# Patient Record
Sex: Male | Born: 1985
Health system: Southern US, Community
[De-identification: ages and names within clinical notes are randomized; demographics above are authoritative.]

## PROBLEM LIST (undated history)

## (undated) DIAGNOSIS — F329 Major depressive disorder, single episode, unspecified: Secondary | ICD-10-CM

## (undated) DIAGNOSIS — K219 Gastro-esophageal reflux disease without esophagitis: Secondary | ICD-10-CM

## (undated) DIAGNOSIS — F419 Anxiety disorder, unspecified: Secondary | ICD-10-CM

## (undated) DIAGNOSIS — F319 Bipolar disorder, unspecified: Secondary | ICD-10-CM

## (undated) DIAGNOSIS — R519 Headache, unspecified: Secondary | ICD-10-CM

## (undated) DIAGNOSIS — E079 Disorder of thyroid, unspecified: Secondary | ICD-10-CM

## (undated) DIAGNOSIS — F909 Attention-deficit hyperactivity disorder, unspecified type: Secondary | ICD-10-CM

## (undated) DIAGNOSIS — R51 Headache: Secondary | ICD-10-CM

## (undated) DIAGNOSIS — I10 Essential (primary) hypertension: Secondary | ICD-10-CM

## (undated) DIAGNOSIS — D751 Secondary polycythemia: Secondary | ICD-10-CM

## (undated) DIAGNOSIS — F32A Depression, unspecified: Secondary | ICD-10-CM

## (undated) DIAGNOSIS — G4730 Sleep apnea, unspecified: Secondary | ICD-10-CM

## (undated) HISTORY — PX: HERNIA REPAIR: SHX51

## (undated) HISTORY — PX: TOOTH EXTRACTION: SUR596

## (undated) HISTORY — DX: Secondary polycythemia: D75.1

---
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
## 2005-04-14 ENCOUNTER — Other Ambulatory Visit: Admission: RE | Admit: 2005-04-14 | Discharge: 2005-04-14 | Payer: Self-pay | Admitting: Internal Medicine

## 2006-02-12 ENCOUNTER — Ambulatory Visit: Payer: Self-pay | Admitting: Internal Medicine

## 2006-04-25 ENCOUNTER — Emergency Department (HOSPITAL_COMMUNITY): Admission: EM | Admit: 2006-04-25 | Discharge: 2006-04-26 | Payer: Self-pay | Admitting: Emergency Medicine

## 2010-08-18 ENCOUNTER — Encounter: Payer: Self-pay | Admitting: Internal Medicine

## 2011-02-17 ENCOUNTER — Encounter: Payer: Self-pay | Admitting: *Deleted

## 2011-02-17 ENCOUNTER — Other Ambulatory Visit: Payer: Self-pay

## 2011-02-17 ENCOUNTER — Emergency Department (HOSPITAL_COMMUNITY)
Admission: EM | Admit: 2011-02-17 | Discharge: 2011-02-18 | Disposition: A | Discharge status: Home / Self Care | Payer: Medicare Other | Attending: Emergency Medicine | Admitting: Emergency Medicine

## 2011-02-17 DIAGNOSIS — I10 Essential (primary) hypertension: Secondary | ICD-10-CM | POA: Insufficient documentation

## 2011-02-17 DIAGNOSIS — K8050 Calculus of bile duct without cholangitis or cholecystitis without obstruction: Secondary | ICD-10-CM

## 2011-02-17 DIAGNOSIS — E119 Type 2 diabetes mellitus without complications: Secondary | ICD-10-CM | POA: Insufficient documentation

## 2011-02-17 DIAGNOSIS — R1011 Right upper quadrant pain: Secondary | ICD-10-CM | POA: Insufficient documentation

## 2011-02-17 DIAGNOSIS — K8020 Calculus of gallbladder without cholecystitis without obstruction: Secondary | ICD-10-CM | POA: Insufficient documentation

## 2011-02-17 HISTORY — DX: Essential (primary) hypertension: I10

## 2011-02-17 LAB — BASIC METABOLIC PANEL
BUN: 23 mg/dL (ref 6–23)
CO2: 28 mEq/L (ref 19–32)
GFR calc non Af Amer: >60 mL/min (ref >60)
Glucose, Bld: 246 mg/dL — ABNORMAL HIGH (ref 70–99)
Potassium: 4.4 mEq/L (ref 3.5–5.1)
Sodium: 136 mEq/L (ref 135–145)

## 2011-02-17 LAB — HEPATIC FUNCTION PANEL
ALT: 27 U/L (ref 0–53)
AST: 17 U/L (ref 0–37)
Albumin: 3.9 g/dL (ref 3.5–5.2)
Alkaline Phosphatase: 83 U/L (ref 39–117)
Total Bilirubin: 0.9 mg/dL (ref 0.3–1.2)
Total Protein: 7.6 g/dL (ref 6.0–8.3)

## 2011-02-17 LAB — CBC
HCT: 44.1 % (ref 39.0–52.0)
Hemoglobin: 15.1 g/dL (ref 13.0–17.0)
MCH: 30.8 pg (ref 26.0–34.0)
MCHC: 34.2 g/dL (ref 30.0–36.0)
RBC: 4.9 MIL/uL (ref 4.22–5.81)

## 2011-02-17 MED ORDER — HYDROMORPHONE HCL 1 MG/ML IJ SOLN
1.0000 mg | Freq: Once | INTRAMUSCULAR | Status: AC
  Administered 2011-02-17: 1 mg via INTRAVENOUS
  Filled 2011-02-17: qty 1

## 2011-02-17 MED ORDER — ONDANSETRON HCL 4 MG/2ML IJ SOLN
4.0000 mg | Freq: Once | INTRAMUSCULAR | Status: AC
  Administered 2011-02-17: 4 mg via INTRAVENOUS
  Filled 2011-02-17: qty 2

## 2011-02-17 MED ORDER — SODIUM CHLORIDE 0.9 % IV BOLUS (SEPSIS)
1000.0000 mL | Freq: Once | INTRAVENOUS | Status: AC
  Administered 2011-02-17 – 2011-02-18 (×2): 1000 mL via INTRAVENOUS

## 2011-02-17 NOTE — ED Notes (Signed)
Pt c/o upper abdominal pain radiating around towards back. Pt also  C/o gas and stomach pressure.

## 2011-02-17 NOTE — ED Notes (Signed)
Pt c/o upper abdominal pain radiaTING AROUND TOWARDS BACK.

## 2011-02-18 ENCOUNTER — Ambulatory Visit (HOSPITAL_COMMUNITY)
Admit: 2011-02-18 | Discharge: 2011-02-18 | Disposition: A | Discharge status: Home / Self Care | Payer: Medicare Other | Source: Ambulatory Visit | Attending: Emergency Medicine | Admitting: Emergency Medicine

## 2011-02-18 DIAGNOSIS — K7689 Other specified diseases of liver: Secondary | ICD-10-CM | POA: Insufficient documentation

## 2011-02-18 DIAGNOSIS — R1011 Right upper quadrant pain: Secondary | ICD-10-CM | POA: Insufficient documentation

## 2011-02-18 DIAGNOSIS — K8020 Calculus of gallbladder without cholecystitis without obstruction: Secondary | ICD-10-CM | POA: Insufficient documentation

## 2011-02-18 LAB — URINALYSIS, ROUTINE W REFLEX MICROSCOPIC
Bilirubin Urine: NEGATIVE
Glucose, UA: NEGATIVE mg/dL
Hgb urine dipstick: NEGATIVE
Specific Gravity, Urine: 1.025 (ref 1.005–1.030)

## 2011-02-18 MED ORDER — HYDROCODONE-ACETAMINOPHEN 7.5-500 MG/15ML PO SOLN: 7.5000 mL | Freq: Four times a day (QID) | ORAL | Status: AC | PRN

## 2011-02-18 MED ORDER — HYDROCODONE-ACETAMINOPHEN 7.5-500 MG/15ML PO SOLN: 10.0000 mL | Freq: Once | ORAL | Status: DC

## 2011-02-18 NOTE — ED Provider Notes (Signed)
History     Chief Complaint  Patient presents with  . Abdominal Pain  . Back Pain   Patient is a 25 y.o. male presenting with abdominal pain and back pain. The history is provided by the patient.  Abdominal Pain The primary symptoms of the illness include abdominal pain. The primary symptoms of the illness do not include fever, shortness of breath or dysuria. The current episode started 13 to 24 hours ago. The onset of the illness was gradual. The problem has not changed since onset. The illness is associated with eating. The patient has not had a change in bowel habit. Additional symptoms associated with the illness include back pain. Symptoms associated with the illness do not include chills, anorexia, diaphoresis, heartburn, constipation, urgency or frequency. Significant associated medical issues include gallstones.  Back Pain  Associated symptoms include abdominal pain. Pertinent negatives include no chest pain, no fever, no headaches and no dysuria.  had gallstones diagnosed a month ago and has scheduled follow up with Dr Leticia Penna for surgery but has scheduled it yet. Pain worse since last night, no fevers or vomiting.  Past Medical History  Diagnosis Date  . Hypertension   . Diabetes mellitus     No past surgical history on file.  History reviewed. No pertinent family history.  History  Substance Use Topics  . Smoking status: Never Smoker   . Smokeless tobacco: Not on file  . Alcohol Use: No      Review of Systems  Constitutional: Negative for fever, chills and diaphoresis.  HENT: Negative for neck pain and neck stiffness.   Eyes: Negative for pain.  Respiratory: Negative for shortness of breath.   Cardiovascular: Negative for chest pain.  Gastrointestinal: Positive for abdominal pain. Negative for heartburn, constipation and anorexia.  Genitourinary: Negative for dysuria, urgency, frequency and discharge.  Musculoskeletal: Positive for back pain. Negative for joint  swelling.  Skin: Negative for rash.  Neurological: Negative for headaches.  All other systems reviewed and are negative.    Physical Exam  BP 149/79  Pulse 93  Temp(Src) 98.2 F (36.8 C) (Oral)  Resp 18  Ht 6\' 1"  (1.854 m)  Wt 350 lb (158.759 kg)  BMI 46.18 kg/m2  SpO2 98%  Physical Exam  Constitutional: He is oriented to person, place, and time. He appears well-developed and well-nourished.  HENT:  Head: Normocephalic and atraumatic.  Eyes: Conjunctivae and EOM are normal. Pupils are equal, round, and reactive to light.  Neck: Trachea normal. Neck supple. No thyromegaly present.  Cardiovascular: Normal rate, regular rhythm, S1 normal, S2 normal and normal pulses.     No systolic murmur is present   No diastolic murmur is present  Pulses:      Radial pulses are 2+ on the right side, and 2+ on the left side.  Pulmonary/Chest: Effort normal and breath sounds normal. He has no wheezes. He has no rhonchi. He has no rales. He exhibits no tenderness.  Abdominal: Soft. Normal appearance and bowel sounds are normal. He exhibits no distension. There is tenderness in the right upper quadrant. There is no rigidity, no rebound, no guarding, no CVA tenderness and negative Murphy's sign. No hernia.  Musculoskeletal:       BLE:s Calves nontender, no cords or erythema, negative Homans sign  Neurological: He is alert and oriented to person, place, and time. He has normal strength. No cranial nerve deficit or sensory deficit. GCS eye subscore is 4. GCS verbal subscore is 5. GCS motor subscore is  6.  Skin: Skin is warm and dry. No rash noted. He is not diaphoretic.  Psychiatric: His speech is normal.       Cooperative and appropriate    ED Course  Procedures  MDM  Pain resolved with dilaudid, PT wants to go home, agrees to follow up precautions and plan to call his surgeon today to schedule surgery.  [place] ordered for him to get this am.  Labs Reviewed  BASIC METABOLIC PANEL - Abnormal;  Notable for the following:    Glucose, Bld 246 (*)    All other components within normal limits  CBC  URINALYSIS, ROUTINE W REFLEX MICROSCOPIC  HEPATIC FUNCTION PANEL  LIPASE, BLOOD         Sunnie Nielsen, MD 02/18/11 [phone]

## 2011-02-18 NOTE — ED Notes (Signed)
Feels better, alert, no distress.

## 2011-02-18 NOTE — ED Notes (Signed)
Pt says he had u/s app 6 mos ago that showed gall stones.  U/s was done in eval of elevated liver enzymes

## 2011-02-18 NOTE — ED Notes (Signed)
Pt asking if edp was finished & if we had found him a ride. Advised pt we are waiting on edp to finish paperwork & that cabs do not start running until around 0730 in the morning. Pt states he does not know anyone & has no one to call.

## 2011-10-17 ENCOUNTER — Encounter (HOSPITAL_COMMUNITY): Payer: Self-pay | Admitting: Emergency Medicine

## 2011-10-17 ENCOUNTER — Emergency Department (HOSPITAL_COMMUNITY)
Admission: EM | Admit: 2011-10-17 | Discharge: 2011-10-17 | Disposition: A | Discharge status: Home / Self Care | Payer: Medicare Other | Attending: Emergency Medicine | Admitting: Emergency Medicine

## 2011-10-17 DIAGNOSIS — I10 Essential (primary) hypertension: Secondary | ICD-10-CM | POA: Insufficient documentation

## 2011-10-17 DIAGNOSIS — E079 Disorder of thyroid, unspecified: Secondary | ICD-10-CM | POA: Insufficient documentation

## 2011-10-17 DIAGNOSIS — E119 Type 2 diabetes mellitus without complications: Secondary | ICD-10-CM | POA: Insufficient documentation

## 2011-10-17 DIAGNOSIS — Z87891 Personal history of nicotine dependence: Secondary | ICD-10-CM | POA: Insufficient documentation

## 2011-10-17 DIAGNOSIS — J329 Chronic sinusitis, unspecified: Secondary | ICD-10-CM | POA: Insufficient documentation

## 2011-10-17 HISTORY — DX: Disorder of thyroid, unspecified: E07.9

## 2011-10-17 MED ORDER — AMOXICILLIN 500 MG PO CAPS: 500.0000 mg | ORAL_CAPSULE | Freq: Three times a day (TID) | ORAL | Status: AC

## 2011-10-17 MED ORDER — DIPHENHYDRAMINE HCL 25 MG PO CAPS: 25.0000 mg | ORAL_CAPSULE | Freq: Four times a day (QID) | ORAL | Status: AC | PRN

## 2011-10-17 NOTE — ED Provider Notes (Signed)
History     CSN: [id number]  Arrival date & time 10/17/11  0900   First MD Initiated Contact with Patient 10/17/11 0915      Chief Complaint  Patient presents with  . Nasal Congestion  . Headache  . Facial Pain    (Consider location/radiation/quality/duration/timing/severity/associated sxs/prior treatment) HPI Comments: Patient c/o nasal congestion, sinus pressure and facial pain.  Symptoms have been persistent for several days.  He denies fever, vomiting, dizziness, neck pain or neck stiffness.  Patient is a 26 y.o. male presenting with sinusitis. The history is provided by the patient. No language interpreter was used.  Sinusitis  This is a new problem. The current episode started more than 2 days ago. The problem has not changed since onset.There has been no fever. The pain is moderate. The pain has been constant since onset. Associated symptoms include congestion and sinus pressure. Pertinent negatives include no chills, no sweats, no ear pain, no sore throat, no swollen glands, no cough and no shortness of breath. He has tried nothing for the symptoms.    Past Medical History  Diagnosis Date  . Hypertension   . Diabetes mellitus   . Thyroid disease     Past Surgical History  Procedure Date  . Hernia repair     History reviewed. No pertinent family history.  History  Substance Use Topics  . Smoking status: Former Games developer  . Smokeless tobacco: Not on file  . Alcohol Use: No      Review of Systems  Constitutional: Negative for fever and chills.  HENT: Positive for congestion, rhinorrhea and sinus pressure. Negative for ear pain, sore throat, facial swelling, trouble swallowing, neck pain, neck stiffness and dental problem.   Respiratory: Negative for cough and shortness of breath.   Cardiovascular: Negative for chest pain.  Gastrointestinal: Negative for nausea and vomiting.  Skin: Negative.   Neurological: Negative for dizziness, light-headedness and headaches.   Hematological: Negative for adenopathy.  All other systems reviewed and are negative.    Allergies  Methylphenidate derivatives and Ritalin  Home Medications   Current Outpatient Rx  Name Route Sig Dispense Refill  . LISINOPRIL 40 MG PO TABS Oral Take 40 mg by mouth daily.      Marland Kitchen METFORMIN HCL 500 MG PO TABS Oral Take 500 mg by mouth daily.      Marland Kitchen PRESCRIPTION MEDICATION  Diabetic medication. Unknown name     . TRAZODONE HCL 150 MG PO TABS Oral Take 150 mg by mouth at bedtime.        BP 171/119  Pulse 84  Temp(Src) 98.4 F (36.9 C) (Oral)  Resp 18  Ht 6\' 1"  (1.854 m)  Wt 394 lb (178.717 kg)  BMI 51.98 kg/m2  SpO2 99%  Physical Exam  Nursing note and vitals reviewed. Constitutional: He is oriented to person, place, and time. He appears well-developed and well-nourished. No distress.  HENT:  Head: Normocephalic and atraumatic.  Right Ear: Tympanic membrane and ear canal normal.  Left Ear: Tympanic membrane normal. Decreased hearing is noted.  Nose: Mucosal edema present. Right sinus exhibits maxillary sinus tenderness and frontal sinus tenderness. Left sinus exhibits maxillary sinus tenderness and frontal sinus tenderness.  Mouth/Throat: Uvula is midline, oropharynx is clear and moist and mucous membranes are normal.  Neck: Normal range of motion. Neck supple.  Cardiovascular: Normal rate, regular rhythm, normal heart sounds and intact distal pulses.   Pulmonary/Chest: Effort normal and breath sounds normal. No respiratory distress.  Musculoskeletal: Normal range  of motion.  Lymphadenopathy:    He has no cervical adenopathy.  Neurological: He is alert and oriented to person, place, and time. He exhibits normal muscle tone. Coordination normal.  Skin: Skin is warm and dry.    ED Course  Procedures (including critical care time)       MDM    Patient is alert, vital signs are stable. No meningeal signs he is nontoxic appearing. He ambulates with a steady gait.  He is hypertensive at this time but states that he has not taken his antihypertensive medication this morning. He denies any chest pain dyspnea headache or focal weakness. He has tenderness to palpation over the maxillary sinuses and purulent nasal congestion. His symptoms are likely related to sinusitis. His airway is patent. I will begin treatment with antibiotics. He agrees to close followup with his primary care physician and to take his medications when he arrives home he will also return here if his symptoms worsen.   Patient / Family / Caregiver understand and agree with initial ED impression and plan with expectations set for ED visit. Pt stable in ED with no significant deterioration in condition. Pt feels improved after observation and/or treatment in ED.         Giavana Rooke L. [place], [place] 10/20/11 2149

## 2011-10-17 NOTE — ED Notes (Signed)
Sinus pressure and pain. Congestion without cough.

## 2011-10-17 NOTE — Discharge Instructions (Signed)
Sinusitis Sinusitis an infection of the air pockets (sinuses) in your face. This can cause puffiness (swelling). It can also cause drainage from your sinuses.   HOME CARE    Only take medicine as told by your doctor.   Drink enough fluids to keep your pee (urine) clear or pale yellow.   Apply moist heat or ice packs for pain relief.   Use salt (saline) nose sprays. The spray will wet the thick fluid in the nose. This can help the sinuses drain.  GET HELP RIGHT AWAY IF:    You have a fever.   Your baby is older than 3 months with a rectal temperature of 102 F (38.9 C) or higher.   Your baby is 3 months old or younger with a rectal temperature of 100.4 F (38 C) or higher.   The pain gets worse.   You get a very bad headache.   You keep throwing up (vomiting).   Your face gets puffy.  MAKE SURE YOU:    Understand these instructions.   Will watch your condition.   Will get help right away if you are not doing well or get worse.  Document Released: 12/31/2007 Document Revised: 07/03/2011 Document Reviewed: 12/31/2007 Exit[employer] Patient Information 2012 [employer]. 

## 2011-10-27 NOTE — ED Provider Notes (Signed)
Medical screening examination/treatment/procedure(s) were performed by non-physician practitioner and as supervising physician I was immediately available for consultation/collaboration.  Bohden Dung S. Cherylanne Ardelean, MD 10/27/11 1343 

## 2014-06-26 ENCOUNTER — Emergency Department (HOSPITAL_COMMUNITY)
Admission: EM | Admit: 2014-06-26 | Discharge: 2014-06-26 | Disposition: A | Discharge status: Home / Self Care | Payer: Medicare Other | Attending: Emergency Medicine | Admitting: Emergency Medicine

## 2014-06-26 ENCOUNTER — Emergency Department (HOSPITAL_COMMUNITY): Payer: Medicare Other

## 2014-06-26 ENCOUNTER — Encounter (HOSPITAL_COMMUNITY): Payer: Self-pay | Admitting: *Deleted

## 2014-06-26 DIAGNOSIS — K0889 Other specified disorders of teeth and supporting structures: Secondary | ICD-10-CM

## 2014-06-26 DIAGNOSIS — Z79899 Other long term (current) drug therapy: Secondary | ICD-10-CM | POA: Insufficient documentation

## 2014-06-26 DIAGNOSIS — K088 Other specified disorders of teeth and supporting structures: Secondary | ICD-10-CM | POA: Diagnosis not present

## 2014-06-26 DIAGNOSIS — Z87891 Personal history of nicotine dependence: Secondary | ICD-10-CM | POA: Diagnosis not present

## 2014-06-26 DIAGNOSIS — I10 Essential (primary) hypertension: Secondary | ICD-10-CM | POA: Diagnosis not present

## 2014-06-26 DIAGNOSIS — E119 Type 2 diabetes mellitus without complications: Secondary | ICD-10-CM | POA: Insufficient documentation

## 2014-06-26 DIAGNOSIS — R52 Pain, unspecified: Secondary | ICD-10-CM

## 2014-06-26 MED ORDER — LISINOPRIL 40 MG PO TABS: 40.0000 mg | ORAL_TABLET | Freq: Every day | ORAL | Status: AC

## 2014-06-26 MED ORDER — HYDROCODONE-ACETAMINOPHEN 5-325 MG PO TABS
1.0000 | ORAL_TABLET | Freq: Once | ORAL | Status: AC
  Administered 2014-06-26: 1 via ORAL
  Filled 2014-06-26: qty 1

## 2014-06-26 NOTE — ED Provider Notes (Signed)
CSN: [id number]     Arrival date & time 06/26/14  7829 History  This chart was scribed for Maudry Diego, MD by Stephania Fragmin, ED Scribe. This patient was seen in room APA06/APA06 and the patient's care was started at 12:24 PM.    Chief Complaint  Patient presents with  . Dental Pain  . Hypertension   HPI   HPI Comments: Kyle Collins is a 28 y.o. male who presents to the Emergency Department complaining of dental pain and abscess that began about a week ago. He had been to the doctor last month. Abscess was not present, but toothache was present when he went to the dentil. He has been taking Lisinopril. He is currently out of Lisinopril, with his last pill taken last week. His HTN is worsening. Abscess a week or week and half ago, he tried popping his neck which started shooting pain up eyebrow through his back. He had spasms the other night next to his lung. Dr. Scotty Court   Past Medical History  Diagnosis Date  . Hypertension   . Diabetes mellitus   . Thyroid disease    Past Surgical History  Procedure Laterality Date  . Hernia repair     No family history on file. History  Substance Use Topics  . Smoking status: Former Research scientist (life sciences)  . Smokeless tobacco: Not on file  . Alcohol Use: No    Review of Systems    Allergies  Methylphenidate derivatives and Ritalin  Home Medications   Prior to Admission medications   Medication Sig Start Date End Date Taking? Authorizing Provider  lisinopril (PRINIVIL,ZESTRIL) 40 MG tablet Take 40 mg by mouth daily.      Historical Provider, MD  metFORMIN (GLUCOPHAGE) 500 MG tablet Take 500 mg by mouth daily.      Historical Provider, MD  PRESCRIPTION MEDICATION Diabetic medication. Unknown name     Historical Provider, MD  traZODone (DESYREL) 150 MG tablet Take 150 mg by mouth at bedtime.      Historical Provider, MD   BP 187/124 mmHg  Pulse 116  Temp(Src) 98.4 F (36.9 C) (Oral)  Resp 22  Ht 6\' 1"  (1.854 m)  Wt 394 lb (178.717 kg)  BMI 51.99  kg/m2  SpO2 100% Physical Exam  Constitutional: He is oriented to person, place, and time. He appears well-developed.  HENT:  Head: Normocephalic.  Poor dentation. Upper right tooth is inflamed. Gums inflamed and tender.  Eyes: Conjunctivae are normal.  Neck: No tracheal deviation present.  Tenderness to left lateral neck.  Cardiovascular:  No murmur heard. Musculoskeletal: Normal range of motion.  Neurological: He is oriented to person, place, and time.  Skin: Skin is warm.  Psychiatric: He has a normal mood and affect.  Nursing note and vitals reviewed.   ED Course  Procedures (including critical care time)  DIAGNOSTIC STUDIES: Oxygen Saturation is 100% on room air, normal by my interpretation.    COORDINATION OF CARE: 12:26 PM - Discussed treatment plan with pt at bedside which includes ABX and pain medication and pt agreed to plan.  Labs Review Labs Reviewed - No data to display  Imaging Review No results found.   EKG Interpretation None      MDM   Final diagnoses:  None    Dental pain,   Pt to see oral surgeon today.   Pt to follow up with md for bp  The chart was scribed for me under my direct supervision.  I personally performed the  history, physical, and medical decision making and all procedures in the evaluation of this patient.Maudry Diego, MD 06/26/14 [phone]

## 2014-06-26 NOTE — ED Notes (Signed)
Pt states abscess to gum x 1 week and pt states dentist refused to treat pt "because my blood pressure was too high". Pt also states his neck was hurting one night last week and he popped his neck, now having trouble turning his head, per pt.

## 2014-06-26 NOTE — Discharge Instructions (Signed)
Follow up with dentist today and follow up with family md in 1-2 weeks for bp

## 2015-05-04 ENCOUNTER — Other Ambulatory Visit: Payer: Self-pay | Admitting: "Endocrinology

## 2015-06-01 ENCOUNTER — Other Ambulatory Visit: Payer: Self-pay | Admitting: "Endocrinology

## 2015-06-01 DIAGNOSIS — IMO0002 Reserved for concepts with insufficient information to code with codable children: Secondary | ICD-10-CM

## 2015-06-01 DIAGNOSIS — E1165 Type 2 diabetes mellitus with hyperglycemia: Secondary | ICD-10-CM

## 2015-06-01 DIAGNOSIS — E1169 Type 2 diabetes mellitus with other specified complication: Principal | ICD-10-CM

## 2015-06-05 ENCOUNTER — Other Ambulatory Visit (HOSPITAL_COMMUNITY): Payer: Self-pay | Admitting: Otolaryngology

## 2015-06-06 ENCOUNTER — Other Ambulatory Visit: Payer: Self-pay | Admitting: "Endocrinology

## 2015-06-06 NOTE — Pre-Procedure Instructions (Signed)
Kyle Collins  06/06/2015      [facility] - [place], [address] [place] [address] Phone: [phone] Fax: [phone]    Your procedure is scheduled on Thursday, June 14, 2015  Report to [facility] Admitting at 9:30 A.M.  Call this number if you have problems the morning of surgery:  [phone]   Remember:  Do not eat food or drink liquids after midnight Wednesday, June 13, 2015  Take these medicines the morning of surgery with A SIP OF WATER :escitalopram (LEXAPRO)  Do not take any oral diabetes medicines ( pills) the morning of surgery such as metFORMIN (GLUCOPHAGE-XR) Stop taking Aspirin, vitamins, fish oil, and herbal medications. Do not take any NSAIDs ie: Ibuprofen, Advil, Naproxen or any medication containing Aspirin; stop now.  How to Manage Your Diabetes Before Surgery Why is it important to control my blood sugar before and after surgery?   Improving blood sugar levels before and after surgery helps healing and can limit problems.  A way of improving blood sugar control is eating a healthy diet by:  - Eating less sugar and carbohydrates  - Increasing activity/exercise  - Talk with your doctor about reaching your blood sugar goals  High blood sugars (greater than 180 mg/dL) can raise your risk of infections and slow down your recovery so you will need to focus on controlling your diabetes during the weeks before surgery.  Make sure that the doctor who takes care of your diabetes knows about your planned surgery including the date and location.  How do I manage my blood sugars before surgery?   Check your blood sugar at least 4 times a day, 2 days before surgery to make sure that they are not too high or low.   Check your blood sugar the morning of your surgery when you wake up and every 2 hours until you get to the Short-Stay unit.  If your blood sugar is less than 70 mg/dL, you will need to treat  for low blood sugar by:  Treat a low blood sugar (less than 70 mg/dL) with 1/2 cup of clear juice (cranberry or apple), 4 glucose tablets, OR glucose gel.  Recheck blood sugar in 15 minutes after treatment (to make sure it is greater than 70 mg/dL).  If blood sugar is not greater than 70 mg/dL on re-check, call [phone] for further instructions.   Report your blood sugar to the Short-Stay nurse when you get to Short-Stay. References:  [facility], 2007 "How to Manage your Diabetes Before and After Surgery".  Do not wear jewelry, make-up or nail polish.  Do not wear lotions, powders, or perfumes.  You may not wear deodorant.  Do not shave 48 hours prior to surgery.  Men may shave face and neck.  Do not bring valuables to the hospital.  [facility] is not responsible for any belongings or valuables.  Contacts, dentures or bridgework may not be worn into surgery.  Leave your suitcase in the car.  After surgery it may be brought to your room.  For patients admitted to the hospital, discharge time will be determined by your treatment team.  Patients discharged the day of surgery will not be allowed to drive home.   Name and phone number of your driver:   Special instructions: [facility] - Preparing for Surgery  Before surgery, you can play an important role.  Because skin is not  sterile, your skin needs to be as free of germs as possible.  You can reduce the number of germs on you skin by washing with CHG (chlorahexidine gluconate) soap before surgery.  CHG is an antiseptic cleaner which kills germs and bonds with the skin to continue killing germs even after washing.  Please DO NOT use if you have an allergy to CHG or antibacterial soaps.  If your skin becomes reddened/irritated stop using the CHG and inform your nurse when you arrive at Short Stay.  Do not shave (including legs and underarms) for at least 48 hours prior to the first CHG shower.  You may shave  your face.  Please follow these instructions carefully:   1.  Shower with CHG Soap the night before surgery and the morning of Surgery.  2.  If you choose to wash your hair, wash your hair first as usual with your normal shampoo.  3.  After you shampoo, rinse your hair and body thoroughly to remove the Shampoo.  4.  Use CHG as you would any other liquid soap.  You can apply chg directly  to the skin and wash gently with scrungie or a clean washcloth.  5.  Apply the CHG Soap to your body ONLY FROM THE NECK DOWN.  Do not use on open wounds or open sores.  Avoid contact with your eyes, ears, mouth and genitals (private parts).  Wash genitals (private parts) with your normal soap.  6.  Wash thoroughly, paying special attention to the area where your surgery will be performed.  7.  Thoroughly rinse your body with warm water from the neck down.  8.  DO NOT shower/wash with your normal soap after using and rinsing off the CHG Soap.  9.  Pat yourself dry with a clean towel.            10.  Wear clean pajamas.            11.  Place clean sheets on your bed the night of your first shower and do not sleep with pets.  Day of Surgery  Do not apply any lotions/deodorants the morning of surgery.  Please wear clean clothes to the hospital/surgery center.  Please read over the following fact sheets that you were given. Pain Booklet, Coughing and Deep Breathing and Surgical Site Infection Prevention

## 2015-06-07 ENCOUNTER — Encounter (HOSPITAL_COMMUNITY)
Admission: RE | Admit: 2015-06-07 | Discharge: 2015-06-07 | Disposition: A | Discharge status: Home / Self Care | Payer: Medicare Other | Source: Ambulatory Visit | Attending: Otolaryngology | Admitting: Otolaryngology

## 2015-06-07 ENCOUNTER — Encounter (HOSPITAL_COMMUNITY): Payer: Self-pay | Admitting: Emergency Medicine

## 2015-06-07 ENCOUNTER — Encounter (HOSPITAL_COMMUNITY): Payer: Self-pay

## 2015-06-07 DIAGNOSIS — Z01818 Encounter for other preprocedural examination: Secondary | ICD-10-CM | POA: Insufficient documentation

## 2015-06-07 DIAGNOSIS — Z79899 Other long term (current) drug therapy: Secondary | ICD-10-CM | POA: Insufficient documentation

## 2015-06-07 HISTORY — DX: Gastro-esophageal reflux disease without esophagitis: K21.9

## 2015-06-07 HISTORY — DX: Bipolar disorder, unspecified: F31.9

## 2015-06-07 HISTORY — DX: Headache: R51

## 2015-06-07 HISTORY — DX: Headache, unspecified: R51.9

## 2015-06-07 HISTORY — DX: Major depressive disorder, single episode, unspecified: F32.9

## 2015-06-07 HISTORY — DX: Depression, unspecified: F32.A

## 2015-06-07 HISTORY — DX: Sleep apnea, unspecified: G47.30

## 2015-06-07 LAB — BASIC METABOLIC PANEL
ANION GAP: 12 (ref 5–15)
BUN: 15 mg/dL (ref 6–20)
CHLORIDE: 98 mmol/L — AB (ref 101–111)
CO2: 22 mmol/L (ref 22–32)
Calcium: 9.5 mg/dL (ref 8.9–10.3)
Creatinine, Ser: 0.64 mg/dL (ref 0.61–1.24)
GFR calc Af Amer: >60 mL/min (ref >60)
Glucose, Bld: 353 mg/dL — ABNORMAL HIGH (ref 65–99)
POTASSIUM: 4.3 mmol/L (ref 3.5–5.1)
SODIUM: 132 mmol/L — AB (ref 135–145)

## 2015-06-07 LAB — CBC
HCT: 46.5 % (ref 39.0–52.0)
HEMOGLOBIN: 15.9 g/dL (ref 13.0–17.0)
MCH: 30.4 pg (ref 26.0–34.0)
MCHC: 34.2 g/dL (ref 30.0–36.0)
MCV: 88.9 fL (ref 78.0–100.0)
Platelets: 222 10*3/uL (ref 150–400)
RBC: 5.23 MIL/uL (ref 4.22–5.81)
RDW: 12.9 % (ref 11.5–15.5)
WBC: 7.3 10*3/uL (ref 4.0–10.5)

## 2015-06-07 LAB — GLUCOSE, CAPILLARY: GLUCOSE-CAPILLARY: 293 mg/dL — AB (ref 65–99)

## 2015-06-07 NOTE — Progress Notes (Addendum)
PCP: Dr. Scotty Court: [facility], pt. States he has not had an ekg in his office Endocrinologist: Dr. Dorris Fetch : pt states he doesn't check blood sugar,checks randomly.   Pt. States had sleep study done 1 yr. Ago at [facility], will request.

## 2015-06-08 LAB — HEMOGLOBIN A1C
Hgb A1c MFr Bld: 11.4 % — ABNORMAL HIGH (ref 4.8–5.6)
Mean Plasma Glucose: 280 mg/dL

## 2015-06-08 NOTE — Progress Notes (Signed)
Anesthesia Chart Review:  Pt is 29 year old male scheduled for R maxillary antrostomy with tissue removal on 06/14/15 with Dr. Redmond Baseman.   PMH includes:  HTN, DM, OSA, thyroid disease, bipolar disorder, GERD. Current smoker. BMI 48.   Medications include: lisinopril, metformin  Preoperative labs reviewed.  HgbA1c 11.4, glucose 353.   Chest x-ray 06/26/14 reviewed. Negative exam.   EKG 06/07/15: NSR. Nonspecific ST abnormality  Pt will need medical evaluation for uncontrolled DM prior to surgery. Left voicemail for Ailene Ards in Dr. Redmond Baseman' office.   Willeen Cass, FNP-BC [facility] Short Stay Surgical Center/Anesthesiology Phone: [phone] 06/08/2015 4:36 PM

## 2015-06-14 ENCOUNTER — Ambulatory Visit (HOSPITAL_COMMUNITY): Admission: RE | Admit: 2015-06-14 | Payer: Medicare Other | Source: Ambulatory Visit | Admitting: Otolaryngology

## 2015-06-14 ENCOUNTER — Encounter (HOSPITAL_COMMUNITY): Admission: RE | Payer: Self-pay | Source: Ambulatory Visit

## 2015-06-14 SURGERY — MAXILLARY ANTROSTOMY
Anesthesia: General | Laterality: Right

## 2015-07-06 ENCOUNTER — Ambulatory Visit: Payer: Self-pay | Admitting: "Endocrinology

## 2015-07-16 ENCOUNTER — Ambulatory Visit (INDEPENDENT_AMBULATORY_CARE_PROVIDER_SITE_OTHER): Payer: Medicare Other | Admitting: "Endocrinology

## 2015-07-16 ENCOUNTER — Encounter: Payer: Self-pay | Admitting: "Endocrinology

## 2015-07-16 VITALS — BP 138/97 | HR 76 | Ht 73.0 in | Wt 355.0 lb

## 2015-07-16 DIAGNOSIS — Z9119 Patient's noncompliance with other medical treatment and regimen: Secondary | ICD-10-CM | POA: Insufficient documentation

## 2015-07-16 DIAGNOSIS — IMO0001 Reserved for inherently not codable concepts without codable children: Secondary | ICD-10-CM | POA: Insufficient documentation

## 2015-07-16 DIAGNOSIS — I10 Essential (primary) hypertension: Secondary | ICD-10-CM | POA: Diagnosis not present

## 2015-07-16 DIAGNOSIS — E1165 Type 2 diabetes mellitus with hyperglycemia: Secondary | ICD-10-CM | POA: Diagnosis not present

## 2015-07-16 DIAGNOSIS — Z91199 Patient's noncompliance with other medical treatment and regimen due to unspecified reason: Secondary | ICD-10-CM

## 2015-07-16 MED ORDER — CANAGLIFLOZIN 100 MG PO TABS: 100.0000 mg | ORAL_TABLET | Freq: Every day | ORAL | Status: DC

## 2015-07-16 NOTE — Patient Instructions (Signed)

## 2015-07-17 NOTE — Progress Notes (Signed)
Subjective:    Patient ID: Kyle Collins, male    DOB: 22-May-1986, PCP Deloria Lair, MD   Past Medical History  Diagnosis Date  . Hypertension   . Diabetes mellitus   . Thyroid disease   . Sleep apnea   . Depression   . Bipolar disorder ([place])   . GERD (gastroesophageal reflux disease)   . Headache    Past Surgical History  Procedure Laterality Date  . Hernia repair    . Tooth extraction  spring 2016    top left    Social History   Social History  . Marital Status: Single    Spouse Name: N/A  . Number of Children: N/A  . Years of Education: N/A   Social History Main Topics  . Smoking status: Current Every Day Smoker    Types: E-cigarettes  . Smokeless tobacco: None  . Alcohol Use: No  . Drug Use: No  . Sexual Activity: Not Asked   Other Topics Concern  . None   Social History Narrative   Outpatient Encounter Prescriptions as of 07/16/2015  Medication Sig  . escitalopram (LEXAPRO) 20 MG tablet Take 20 mg by mouth daily.  Marland Kitchen lisinopril (PRINIVIL,ZESTRIL) 40 MG tablet Take 1 tablet (40 mg total) by mouth daily.  . metFORMIN (GLUCOPHAGE-XR) 500 MG 24 hr tablet Take 1,000 mg by mouth 2 (two) times daily.  . traZODone (DESYREL) 150 MG tablet Take 75-150 mg by mouth at bedtime as needed for sleep.   . [DISCONTINUED] glipiZIDE (GLUCOTROL) 5 MG tablet Take 5 mg by mouth 2 (two) times daily before a meal.  . canagliflozin (INVOKANA) 100 MG TABS tablet Take 1 tablet (100 mg total) by mouth daily before breakfast.  . tiZANidine (ZANAFLEX) 4 MG tablet Take 4 mg by mouth 2 (two) times daily as needed for muscle spasms (headache).   No facility-administered encounter medications on file as of 07/16/2015.   ALLERGIES: Allergies  Allergen Reactions  . Methylphenidate Derivatives Other (See Comments)    Patient states it made him "act like a zombie" when given as a child  . Ritalin [Methylphenidate Hcl]    VACCINATION STATUS:  There is no immunization history on  file for this patient.  Diabetes He presents for his follow-up diabetic visit. He has type 2 diabetes mellitus. Onset time: Diagnosed at approximate age of 6 years. His disease course has been worsening. There are no hypoglycemic associated symptoms. Pertinent negatives for hypoglycemia include no confusion, headaches, pallor or seizures. Associated symptoms include polydipsia, polyuria and visual change. Pertinent negatives for diabetes include no chest pain, no fatigue, no polyphagia and no weakness. There are no hypoglycemic complications. Symptoms are worsening. There are no diabetic complications. Risk factors for coronary artery disease include diabetes mellitus, hypertension, male sex, obesity, sedentary lifestyle and tobacco exposure. Current diabetic treatment includes oral agent (monotherapy). He is compliant with treatment none of the time. He is following a generally unhealthy diet. When asked about meal planning, he reported none. He has not had a previous visit with a dietitian (He missed his appointment with the dietitian.). Home blood sugar record trend: He does not monitor blood glucose.  Hypertension This is a chronic problem. The current episode started more than 1 year ago. Pertinent negatives include no chest pain, headaches, neck pain, palpitations or shortness of breath. Past treatments include ACE inhibitors.     Review of Systems  Constitutional: Negative for fever, chills, fatigue and unexpected weight change.  HENT: Negative for  dental problem, mouth sores and trouble swallowing.   Eyes: Negative for visual disturbance.  Respiratory: Negative for cough, choking, chest tightness, shortness of breath and wheezing.   Cardiovascular: Negative for chest pain, palpitations and leg swelling.  Gastrointestinal: Negative for nausea, vomiting, abdominal pain, diarrhea, constipation and abdominal distention.  Endocrine: Positive for polydipsia and polyuria. Negative for polyphagia.   Genitourinary: Negative for dysuria, urgency, hematuria and flank pain.  Musculoskeletal: Negative for myalgias, back pain, gait problem and neck pain.  Skin: Negative for pallor, rash and wound.  Neurological: Negative for seizures, syncope, weakness, numbness and headaches.  Psychiatric/Behavioral: Negative.  Negative for confusion and dysphoric mood.    Objective:    BP 138/97 mmHg  Pulse 76  Ht 6\' 1"  (1.854 m)  Wt 355 lb (161.027 kg)  BMI 46.85 kg/m2  SpO2 98%  Wt Readings from Last 3 Encounters:  07/16/15 355 lb (161.027 kg)  06/07/15 361 lb 6.4 oz (163.93 kg)  06/26/14 394 lb (178.717 kg)    Physical Exam  Constitutional: He is oriented to person, place, and time. He appears well-developed and well-nourished. He is cooperative. No distress.  HENT:  Head: Normocephalic and atraumatic.  Eyes: EOM are normal.  Neck: Normal range of motion. Neck supple. No tracheal deviation present. No thyromegaly present.  Cardiovascular: Normal rate, S1 normal, S2 normal and normal heart sounds.  Exam reveals no gallop.   No murmur heard. Pulses:      Dorsalis pedis pulses are 1+ on the right side, and 1+ on the left side.       Posterior tibial pulses are 1+ on the right side, and 1+ on the left side.  Pulmonary/Chest: Breath sounds normal. No respiratory distress. He has no wheezes.  Abdominal: Soft. Bowel sounds are normal. He exhibits no distension. There is no tenderness. There is no guarding and no CVA tenderness.  Musculoskeletal: He exhibits no edema.       Right shoulder: He exhibits no swelling and no deformity.  Neurological: He is alert and oriented to person, place, and time. He has normal strength and normal reflexes. No cranial nerve deficit or sensory deficit. Gait normal.  Skin: Skin is warm and dry. No rash noted. No cyanosis. Nails show no clubbing.  Psychiatric: His speech is normal. Cognition and memory are normal.  Noncompliant, defensive and reluctant affect.     Results for orders placed or performed during the hospital encounter of 06/07/15  Glucose, capillary  Result Value Ref Range   Glucose-Capillary 293 (H) 65 - 99 mg/dL  Hemoglobin A1c  Result Value Ref Range   Hgb A1c MFr Bld 11.4 (H) 4.8 - 5.6 %   Mean Plasma Glucose 280 mg/dL  Basic metabolic panel  Result Value Ref Range   Sodium 132 (L) 135 - 145 mmol/L   Potassium 4.3 3.5 - 5.1 mmol/L   Chloride 98 (L) 101 - 111 mmol/L   CO2 22 22 - 32 mmol/L   Glucose, Bld 353 (H) 65 - 99 mg/dL   BUN 15 6 - 20 mg/dL   Creatinine, Ser 0.64 0.61 - 1.24 mg/dL   Calcium 9.5 8.9 - 10.3 mg/dL   GFR calc non Af Amer >60 >60 mL/min   GFR calc Af Amer >60 >60 mL/min   Anion gap 12 5 - 15  CBC  Result Value Ref Range   WBC 7.3 4.0 - 10.5 K/uL   RBC 5.23 4.22 - 5.81 MIL/uL   Hemoglobin 15.9 13.0 - 17.0 g/dL  HCT 46.5 39.0 - 52.0 %   MCV 88.9 78.0 - 100.0 fL   MCH 30.4 26.0 - 34.0 pg   MCHC 34.2 30.0 - 36.0 g/dL   RDW 12.9 11.5 - 15.5 %   Platelets 222 150 - 400 K/uL   Complete Blood Count (Most recent): Lab Results  Component Value Date   WBC 7.3 06/07/2015   HGB 15.9 06/07/2015   HCT 46.5 06/07/2015   MCV 88.9 06/07/2015   PLT 222 06/07/2015   Chemistry (most recent): Lab Results  Component Value Date   NA 132* 06/07/2015   K 4.3 06/07/2015   CL 98* 06/07/2015   CO2 22 06/07/2015   BUN 15 06/07/2015   CREATININE 0.64 06/07/2015   Diabetic Labs (most recent): Lab Results  Component Value Date   HGBA1C 11.4* 06/07/2015     Assessment & Plan:   1. Uncontrolled type 2 diabetes mellitus without complication, without [place]-term current use of insulin ([place]   - His diabetes is  complicated by noncompliance and patient remains at a high risk for more acute and chronic complications of diabetes which include CAD, CVA, CKD, retinopathy, and neuropathy. These are all discussed in detail with the patient.  Patient came with elevated A1c of 11.4 %. He did not monitor blood  glucose, did not bring any meter. Recent labs reviewed.   - I have re-counseled the patient on diet management and weight loss  by adopting a carbohydrate restricted / protein rich  Diet.  - Suggestion is made for patient to avoid simple carbohydrates   from their diet including Cakes , Desserts, Ice Cream,  Soda (  diet and regular) , Sweet Tea , Candies,  Chips, Cookies, Artificial Sweeteners,   and "Sugar-free" Products .  This will help patient to have stable blood glucose profile and potentially avoid unintended  Weight gain.  - Patient is advised to stick to a routine mealtimes to eat 3 meals  a day and avoid unnecessary snacks ( to snack only to correct hypoglycemia).  - The patient  has been  scheduled with Jearld Fenton, RDN, CDE for individualized DM education. He did not show up for appointment, will reschedule.  - I have approached patient with the following individualized plan to manage diabetes and patient agrees.  - Based on his significantly elevated A1c he would need insulin therapy however he refused. -He would not even monitor blood glucose.   -I will continue MTF 1000mg  po BID, and resume Invokana 100mg  po qday.   - Patient will be considered for incretin therapy as appropriate next visit. - Patient specific target  for A1c; LDL, HDL, Triglycerides, and  Waist Circumference were discussed in detail.  2) BP/HTN: Uncontrolled. Continue current medications including ACEI/ARB.  3)  Weight/Diet:  He is reluctantly accepts CDE consult, he is not motivated to exercise. - carbohydrates information provided. 4. Personal history of noncompliance with medical treatment, presenting hazards to health -I tried to counsel this patient however he is frankly unconcerned and does not want to take any responsibility. 5) Chronic Care/Health Maintenance:  -Patient  on ACEI/ARB  and encouraged to continue to follow up with Ophthalmology, Podiatrist at least yearly or according to  recommendations, and advised to  quit smoking. I have recommended yearly flu vaccine and pneumonia vaccination at least every 5 years; moderate intensity exercise for up to 150 minutes weekly; and  sleep for at least 7 hours a day.  - 25 minutes of time was spent on  the care of this patient , 50% of which was applied for counseling on diabetes complications and their preventions.  - I advised patient to maintain close follow up with TAPPER,DAVID B, MD for primary care needs.  Patient is asked to bring meter and  blood glucose logs during their next visit.   Follow up plan: -Return in about 3 months (around 10/14/2015) for diabetes, high blood pressure, high cholesterol.  Glade Lloyd, MD Phone: [phone]  Fax: [phone]   07/17/2015, 8:47 PM

## 2015-07-31 ENCOUNTER — Encounter (HOSPITAL_COMMUNITY): Payer: Self-pay | Admitting: Emergency Medicine

## 2015-07-31 ENCOUNTER — Emergency Department (HOSPITAL_COMMUNITY)
Admission: EM | Admit: 2015-07-31 | Discharge: 2015-07-31 | Disposition: A | Discharge status: Home / Self Care | Payer: Medicare Other | Attending: Emergency Medicine | Admitting: Emergency Medicine

## 2015-07-31 ENCOUNTER — Emergency Department (HOSPITAL_COMMUNITY): Payer: Medicare Other

## 2015-07-31 DIAGNOSIS — F17210 Nicotine dependence, cigarettes, uncomplicated: Secondary | ICD-10-CM | POA: Diagnosis not present

## 2015-07-31 DIAGNOSIS — Z8719 Personal history of other diseases of the digestive system: Secondary | ICD-10-CM | POA: Insufficient documentation

## 2015-07-31 DIAGNOSIS — F319 Bipolar disorder, unspecified: Secondary | ICD-10-CM | POA: Diagnosis not present

## 2015-07-31 DIAGNOSIS — Z7984 Long term (current) use of oral hypoglycemic drugs: Secondary | ICD-10-CM | POA: Diagnosis not present

## 2015-07-31 DIAGNOSIS — R739 Hyperglycemia, unspecified: Secondary | ICD-10-CM | POA: Diagnosis not present

## 2015-07-31 DIAGNOSIS — I10 Essential (primary) hypertension: Secondary | ICD-10-CM | POA: Insufficient documentation

## 2015-07-31 DIAGNOSIS — R0989 Other specified symptoms and signs involving the circulatory and respiratory systems: Secondary | ICD-10-CM | POA: Diagnosis not present

## 2015-07-31 DIAGNOSIS — E119 Type 2 diabetes mellitus without complications: Secondary | ICD-10-CM | POA: Insufficient documentation

## 2015-07-31 DIAGNOSIS — Z8669 Personal history of other diseases of the nervous system and sense organs: Secondary | ICD-10-CM | POA: Diagnosis not present

## 2015-07-31 DIAGNOSIS — Z79899 Other long term (current) drug therapy: Secondary | ICD-10-CM | POA: Diagnosis not present

## 2015-07-31 NOTE — Discharge Instructions (Signed)
Clear liquids only after midnight.  Call Dr. Olevia Perches office first thing in the morning at [phone].

## 2015-07-31 NOTE — ED Notes (Signed)
Pt states he had chicken for dinner and feels like something is stuck in his throat. Pt can swallow saliva without any problems.

## 2015-07-31 NOTE — ED Provider Notes (Signed)
CSN: [id number]     Arrival date & time 07/31/15  1957 History  By signing my name below, I, Hansel Feinstein, attest that this documentation has been prepared under the direction and in the presence of Pattricia Boss, MD. Electronically Signed: Hansel Feinstein, ED Scribe. 07/31/2015. 8:14 PM.    Chief Complaint  Patient presents with  . Airway Obstruction    Patient is a 30 y.o. male presenting with foreign body swallowed. The history is provided by the patient. No language interpreter was used.  Swallowed Foreign Body This is a new problem. The current episode started 1 to 2 hours ago. The problem occurs constantly. The problem has not changed since onset.Pertinent negatives include no shortness of breath. Nothing aggravates the symptoms. Nothing relieves the symptoms. He has tried nothing for the symptoms. The treatment provided no relief.    HPI Comments: MEMPHIS SHREINER is a 30 y.o. male with h/o DM, HTN, OSA, bipolar disorder, GERD who presents to the Emergency Department complaining of a constant foreign body sensation in his esophagus onset an hour ago while eating chicken. He notes that he feels as though a chicken bone in stuck in his esophagus. Pt states no relieving or worsening factors. He takes daily Metformin, Lisinopril, Trazodone, Invokana and Lexapro. NKDA. Pt smokes e-cigarettes. He denies difficulty breathing or swallowing.   Past Medical History  Diagnosis Date  . Hypertension   . Diabetes mellitus   . Thyroid disease   . Sleep apnea   . Depression   . Bipolar disorder ([place])   . GERD (gastroesophageal reflux disease)   . Headache    Past Surgical History  Procedure Laterality Date  . Hernia repair    . Tooth extraction  spring 2016    top left    Family History  Problem Relation Age of Onset  . Diabetes Mother   . Diabetes Father    Social History  Substance Use Topics  . Smoking status: Current Every Day Smoker    Types: E-cigarettes  . Smokeless tobacco: None  .  Alcohol Use: No    Review of Systems  HENT: Negative for trouble swallowing.   Respiratory: Negative for shortness of breath.   All other systems reviewed and are negative.  Allergies  Methylphenidate derivatives and Ritalin  Home Medications   Prior to Admission medications   Medication Sig Start Date End Date Taking? Authorizing Provider  canagliflozin (INVOKANA) 100 MG TABS tablet Take 1 tablet (100 mg total) by mouth daily before breakfast. 07/16/15  Yes Cassandria Anger, MD  escitalopram (LEXAPRO) 20 MG tablet Take 20 mg by mouth daily.   Yes Historical Provider, MD  lisinopril (PRINIVIL,ZESTRIL) 40 MG tablet Take 1 tablet (40 mg total) by mouth daily. 06/26/14  Yes Milton Ferguson, MD  metFORMIN (GLUCOPHAGE-XR) 500 MG 24 hr tablet Take 1,000 mg by mouth 2 (two) times daily.   Yes Historical Provider, MD  traZODone (DESYREL) 150 MG tablet Take 75-150 mg by mouth at bedtime as needed for sleep.    Yes Historical Provider, MD   BP 142/81 mmHg  Pulse 104  Temp(Src) 98.5 F (36.9 C) (Oral)  Resp 18  Ht 6\' 1"  (1.854 m)  Wt 350 lb (158.759 kg)  BMI 46.19 kg/m2  SpO2 94% Physical Exam  Constitutional: He is oriented to person, place, and time. He appears well-developed and well-nourished.  HENT:  Head: Normocephalic and atraumatic.  Right Ear: External ear normal.  Left Ear: External ear normal.  Nose: Nose  normal.  Mouth/Throat: Oropharynx is clear and moist.  Eyes: Conjunctivae and EOM are normal. Pupils are equal, round, and reactive to light.  Neck: Normal range of motion. Neck supple.  Cardiovascular: Normal rate, regular rhythm, normal heart sounds and intact distal pulses.   Pulmonary/Chest: Effort normal and breath sounds normal. No respiratory distress. He has no wheezes. He exhibits no tenderness.  Abdominal: Soft. Bowel sounds are normal. He exhibits no distension and no mass. There is no tenderness. There is no guarding.  Musculoskeletal: Normal range of motion.   Neurological: He is alert and oriented to person, place, and time. He has normal reflexes. He exhibits normal muscle tone. Coordination normal.  Skin: Skin is warm and dry.  Psychiatric: He has a normal mood and affect. His behavior is normal. Judgment and thought content normal.  Nursing note and vitals reviewed.   ED Course  Procedures (including critical care time) DIAGNOSTIC STUDIES: Oxygen Saturation is 94% on RA, adequate by my interpretation.    COORDINATION OF CARE: 8:12 PM Discussed treatment plan with pt at bedside which includes DG neck soft tissue and pt agreed to plan.  9:24 PM Informed pt of normal imaging results.    Imaging Review Dg Neck Soft Tissue  07/31/2015  CLINICAL DATA:  Foreign body sensation in throat. Sensation after eating chicken 1 hour prior. Question retained chicken bone. EXAM: NECK SOFT TISSUES - 1+ VIEW COMPARISON:  Cervical spine radiographs 06/26/2014 FINDINGS: There is no evidence of retropharyngeal soft tissue swelling or epiglottic enlargement. The cervical airway is unremarkable. No radio-opaque foreign body identified. IMPRESSION: No radiopaque foreign body. Electronically Signed   By: Jeb Levering M.D.   On: 07/31/2015 20:42   I have personally reviewed and evaluated these images as part of my medical decision-making.   MDM   Final diagnoses:  Foreign body sensation in throat    Discussed with Dr. Laural Golden.  Patient npo x clear liquids after mn and to call Dr. Laural Golden first thing in am if symptoms persist.  Advised re return precautions and voices understanding.    I personally performed the services described in this documentation, which was scribed in my presence. The recorded information has been reviewed and considered.     Pattricia Boss, MD 08/02/15 [phone]

## 2015-09-05 DIAGNOSIS — J0100 Acute maxillary sinusitis, unspecified: Secondary | ICD-10-CM | POA: Diagnosis not present

## 2015-09-05 DIAGNOSIS — N481 Balanitis: Secondary | ICD-10-CM | POA: Diagnosis not present

## 2015-09-05 DIAGNOSIS — K130 Diseases of lips: Secondary | ICD-10-CM | POA: Diagnosis not present

## 2015-10-09 ENCOUNTER — Other Ambulatory Visit: Payer: Self-pay | Admitting: "Endocrinology

## 2015-10-09 DIAGNOSIS — E1165 Type 2 diabetes mellitus with hyperglycemia: Secondary | ICD-10-CM | POA: Diagnosis not present

## 2015-10-10 LAB — HEMOGLOBIN A1C
HEMOGLOBIN A1C: 10.3 % — AB (ref <5.7)
Mean Plasma Glucose: 249 mg/dL — ABNORMAL HIGH (ref <117)

## 2015-10-10 LAB — BASIC METABOLIC PANEL
BUN: 13 mg/dL (ref 7–25)
CALCIUM: 10 mg/dL (ref 8.6–10.3)
CHLORIDE: 98 mmol/L (ref 98–110)
CO2: 26 mmol/L (ref 20–31)
CREATININE: 0.69 mg/dL (ref 0.60–1.35)
Glucose, Bld: 261 mg/dL — ABNORMAL HIGH (ref 65–99)
Potassium: 4.4 mmol/L (ref 3.5–5.3)
Sodium: 136 mmol/L (ref 135–146)

## 2015-10-15 ENCOUNTER — Ambulatory Visit: Payer: Medicare Other | Admitting: "Endocrinology

## 2015-10-26 ENCOUNTER — Encounter: Payer: Self-pay | Admitting: "Endocrinology

## 2015-10-26 ENCOUNTER — Ambulatory Visit (INDEPENDENT_AMBULATORY_CARE_PROVIDER_SITE_OTHER): Payer: Medicare Other | Admitting: "Endocrinology

## 2015-10-26 VITALS — BP 152/96 | HR 87 | Ht 73.0 in | Wt 357.0 lb

## 2015-10-26 DIAGNOSIS — Z9119 Patient's noncompliance with other medical treatment and regimen: Secondary | ICD-10-CM | POA: Diagnosis not present

## 2015-10-26 DIAGNOSIS — I10 Essential (primary) hypertension: Secondary | ICD-10-CM

## 2015-10-26 DIAGNOSIS — Z91199 Patient's noncompliance with other medical treatment and regimen due to unspecified reason: Secondary | ICD-10-CM

## 2015-10-26 DIAGNOSIS — E1165 Type 2 diabetes mellitus with hyperglycemia: Secondary | ICD-10-CM

## 2015-10-26 DIAGNOSIS — IMO0001 Reserved for inherently not codable concepts without codable children: Secondary | ICD-10-CM

## 2015-10-26 MED ORDER — HYDROCHLOROTHIAZIDE 25 MG PO TABS: 25.0000 mg | ORAL_TABLET | Freq: Every day | ORAL | Status: DC

## 2015-10-26 NOTE — Progress Notes (Signed)
Subjective:    Patient ID: Kyle Collins, male    DOB: 11-Oct-1985, PCP Deloria Lair, MD   Past Medical History  Diagnosis Date  . Hypertension   . Diabetes mellitus   . Thyroid disease   . Sleep apnea   . Depression   . Bipolar disorder ([place])   . GERD (gastroesophageal reflux disease)   . Headache    Past Surgical History  Procedure Laterality Date  . Hernia repair    . Tooth extraction  spring 2016    top left    Social History   Social History  . Marital Status: Single    Spouse Name: N/A  . Number of Children: N/A  . Years of Education: N/A   Social History Main Topics  . Smoking status: Current Every Day Smoker    Types: E-cigarettes  . Smokeless tobacco: None  . Alcohol Use: No  . Drug Use: No  . Sexual Activity: Not Asked   Other Topics Concern  . None   Social History Narrative   Outpatient Encounter Prescriptions as of 10/26/2015  Medication Sig  . canagliflozin (INVOKANA) 100 MG TABS tablet Take 1 tablet (100 mg total) by mouth daily before breakfast.  . escitalopram (LEXAPRO) 20 MG tablet Take 20 mg by mouth daily.  Marland Kitchen lisinopril (PRINIVIL,ZESTRIL) 40 MG tablet Take 1 tablet (40 mg total) by mouth daily.  . metFORMIN (GLUCOPHAGE-XR) 500 MG 24 hr tablet Take 1,000 mg by mouth daily after breakfast.  . traZODone (DESYREL) 150 MG tablet Take 75-150 mg by mouth at bedtime as needed for sleep.   . hydrochlorothiazide (HYDRODIURIL) 25 MG tablet Take 1 tablet (25 mg total) by mouth daily.   No facility-administered encounter medications on file as of 10/26/2015.   ALLERGIES: Allergies  Allergen Reactions  . Methylphenidate Derivatives Other (See Comments)    Patient states it made him "act like a zombie" when given as a child  . Ritalin [Methylphenidate Hcl]    VACCINATION STATUS:  There is no immunization history on file for this patient.  Diabetes He presents for his follow-up diabetic visit. He has type 2 diabetes mellitus. Onset time:  Diagnosed at approximate age of 65 years. His disease course has been worsening. There are no hypoglycemic associated symptoms. Pertinent negatives for hypoglycemia include no confusion, headaches, pallor or seizures. Associated symptoms include polydipsia, polyuria and visual change. Pertinent negatives for diabetes include no chest pain, no fatigue, no polyphagia and no weakness. There are no hypoglycemic complications. Symptoms are worsening. There are no diabetic complications. Risk factors for coronary artery disease include diabetes mellitus, hypertension, male sex, obesity, sedentary lifestyle and tobacco exposure. Current diabetic treatment includes oral agent (monotherapy). He is compliant with treatment none of the time. He is following a generally unhealthy diet. When asked about meal planning, he reported none. He has not had a previous visit with a dietitian (He missed his appointment with the dietitian.). Home blood sugar record trend: He does not monitor blood glucose.  Hypertension This is a chronic problem. The current episode started more than 1 year ago. Pertinent negatives include no chest pain, headaches, neck pain, palpitations or shortness of breath. Past treatments include ACE inhibitors.     Review of Systems  Constitutional: Negative for fever, chills, fatigue and unexpected weight change.  HENT: Negative for dental problem, mouth sores and trouble swallowing.   Eyes: Negative for visual disturbance.  Respiratory: Negative for cough, choking, chest tightness, shortness of breath and wheezing.  Cardiovascular: Negative for chest pain, palpitations and leg swelling.  Gastrointestinal: Negative for nausea, vomiting, abdominal pain, diarrhea, constipation and abdominal distention.  Endocrine: Positive for polydipsia and polyuria. Negative for polyphagia.  Genitourinary: Negative for dysuria, urgency, hematuria and flank pain.  Musculoskeletal: Negative for myalgias, back pain,  gait problem and neck pain.  Skin: Negative for pallor, rash and wound.  Neurological: Negative for seizures, syncope, weakness, numbness and headaches.  Psychiatric/Behavioral: Negative.  Negative for confusion and dysphoric mood.    Objective:    BP 152/96 mmHg  Pulse 87  Ht 6\' 1"  (1.854 m)  Wt 357 lb (161.934 kg)  BMI 47.11 kg/m2  SpO2 98%  Wt Readings from Last 3 Encounters:  10/26/15 357 lb (161.934 kg)  07/31/15 350 lb (158.759 kg)  07/16/15 355 lb (161.027 kg)    Physical Exam  Constitutional: He is oriented to person, place, and time. He appears well-developed and well-nourished. He is cooperative. No distress.  HENT:  Head: Normocephalic and atraumatic.  Eyes: EOM are normal.  Neck: Normal range of motion. Neck supple. No tracheal deviation present. No thyromegaly present.  Cardiovascular: Normal rate, S1 normal, S2 normal and normal heart sounds.  Exam reveals no gallop.   No murmur heard. Pulses:      Dorsalis pedis pulses are 1+ on the right side, and 1+ on the left side.       Posterior tibial pulses are 1+ on the right side, and 1+ on the left side.  Pulmonary/Chest: Breath sounds normal. No respiratory distress. He has no wheezes.  Abdominal: Soft. Bowel sounds are normal. He exhibits no distension. There is no tenderness. There is no guarding and no CVA tenderness.  Musculoskeletal: He exhibits no edema.       Right shoulder: He exhibits no swelling and no deformity.  Neurological: He is alert and oriented to person, place, and time. He has normal strength and normal reflexes. No cranial nerve deficit or sensory deficit. Gait normal.  Skin: Skin is warm and dry. No rash noted. No cyanosis. Nails show no clubbing.  Psychiatric: His speech is normal. Cognition and memory are normal.  Noncompliant, defensive and reluctant affect.    Results for orders placed or performed in visit on [id number]  Basic metabolic panel  Result Value Ref Range   Sodium 136 135 - 146  mmol/L   Potassium 4.4 3.5 - 5.3 mmol/L   Chloride 98 98 - 110 mmol/L   CO2 26 20 - 31 mmol/L   Glucose, Bld 261 (H) 65 - 99 mg/dL   BUN 13 7 - 25 mg/dL   Creat 0.69 0.60 - 1.35 mg/dL   Calcium 10.0 8.6 - 10.3 mg/dL  Hemoglobin A1c  Result Value Ref Range   Hgb A1c MFr Bld 10.3 (H) <5.7 %   Mean Plasma Glucose 249 (H) <117 mg/dL   Complete Blood Count (Most recent): Lab Results  Component Value Date   WBC 7.3 06/07/2015   HGB 15.9 06/07/2015   HCT 46.5 06/07/2015   MCV 88.9 06/07/2015   PLT 222 06/07/2015   Chemistry (most recent): Lab Results  Component Value Date   NA 136 10/09/2015   K 4.4 10/09/2015   CL 98 10/09/2015   CO2 26 10/09/2015   BUN 13 10/09/2015   CREATININE 0.69 10/09/2015    Lab Results  Component Value Date   HGBA1C 10.3* 10/09/2015   HGBA1C 11.4* 06/07/2015     Assessment & Plan:   1. Uncontrolled type 2  diabetes mellitus without complication, without Malmquist-term current use of insulin ([place]   - His diabetes is  complicated by noncompliance and patient remains at a high risk for more acute and chronic complications of diabetes which include CAD, CVA, CKD, retinopathy, and neuropathy. These are all discussed in detail with the patient.  Patient came with still high a1c of 10.3% , slightly improved from  11.4 %. He did not monitor blood glucose, did not bring any meter. Recent labs reviewed.   - I have re-counseled the patient on diet management and weight loss  by adopting a carbohydrate restricted / protein rich  Diet.  - Suggestion is made for patient to avoid simple carbohydrates   from their diet including Cakes , Desserts, Ice Cream,  Soda (  diet and regular) , Sweet Tea , Candies,  Chips, Cookies, Artificial Sweeteners,   and "Sugar-free" Products .  This will help patient to have stable blood glucose profile and potentially avoid unintended  Weight gain.  - Patient is advised to stick to a routine mealtimes to eat 3 meals  a day and avoid  unnecessary snacks ( to snack only to correct hypoglycemia).  - The patient  has been  scheduled with Jearld Fenton, RDN, CDE for individualized DM education. He did not show up for appointment, will reschedule.  - I have approached patient with the following individualized plan to manage diabetes and patient agrees.  - Based on his significantly elevated A1c he would need insulin therapy however he still  Refuses insulin therapy. -He would not even monitor blood glucose.   -I will continue MTF 500mg   ER po BID, and continue Invokana 100mg  po qday. - he is seeking clearance for elective surgery in his "sinus", but he is high risk with estimated glucose of 255 on average.  - Patient specific target  for A1c; LDL, HDL, Triglycerides, and  Waist Circumference were discussed in detail.  2) BP/HTN: Uncontrolled. Continue current medications including ACEI/ARB. I added HCTZ 25mg  po qday.  3)  Weight/Diet:  He did reluctantly accepted CDE consult, , unfortunately he did not keep appointment. he is not motivated to exercise. - carbohydrates information provided. 4. Personal history of noncompliance with medical treatment, presenting hazards to health -I tried to counsel this patient however he is frankly unconcerned and does not want to take any responsibility.  5) Chronic Care/Health Maintenance:  -Patient  on ACEI/ARB  and encouraged to continue to follow up with Ophthalmology, Podiatrist at least yearly or according to recommendations, and advised to  quit smoking. I have recommended yearly flu vaccine and pneumonia vaccination at least every 5 years; moderate intensity exercise for up to 150 minutes weekly; and  sleep for at least 7 hours a day.  - 25 minutes of time was spent on the care of this patient , 50% of which was applied for counseling on diabetes complications and their preventions.  - I advised patient to maintain close follow up with TAPPER,DAVID B, MD for primary care needs.   Patient is asked to bring meter and  blood glucose logs during their next visit.   Follow up plan: -Return in about 3 months (around 01/25/2016) for diabetes, high blood pressure.  Glade Lloyd, MD Phone: [phone]  Fax: [phone]   10/26/2015, 10:59 AM

## 2015-10-27 ENCOUNTER — Other Ambulatory Visit: Payer: Self-pay | Admitting: "Endocrinology

## 2015-10-30 ENCOUNTER — Ambulatory Visit: Payer: Medicare Other | Admitting: Urology

## 2015-12-04 ENCOUNTER — Emergency Department (HOSPITAL_COMMUNITY): Payer: Medicare Other

## 2015-12-04 ENCOUNTER — Encounter (HOSPITAL_COMMUNITY): Payer: Self-pay | Admitting: Emergency Medicine

## 2015-12-04 ENCOUNTER — Emergency Department (HOSPITAL_COMMUNITY): Payer: Medicare Other | Admitting: Anesthesiology

## 2015-12-04 ENCOUNTER — Observation Stay (HOSPITAL_COMMUNITY)
Admission: EM | Admit: 2015-12-04 | Discharge: 2015-12-05 | Disposition: A | Discharge status: Home / Self Care | Payer: Medicare Other | Attending: Orthopedic Surgery | Admitting: Orthopedic Surgery

## 2015-12-04 ENCOUNTER — Encounter (HOSPITAL_COMMUNITY)
Admission: EM | Disposition: A | Discharge status: Home / Self Care | Payer: Self-pay | Source: Home / Self Care | Attending: Emergency Medicine

## 2015-12-04 ENCOUNTER — Ambulatory Visit: Payer: Medicare Other | Admitting: Urology

## 2015-12-04 DIAGNOSIS — Y9389 Activity, other specified: Secondary | ICD-10-CM | POA: Diagnosis not present

## 2015-12-04 DIAGNOSIS — R739 Hyperglycemia, unspecified: Secondary | ICD-10-CM | POA: Diagnosis present

## 2015-12-04 DIAGNOSIS — Y92003 Bedroom of unspecified non-institutional (private) residence as the place of occurrence of the external cause: Secondary | ICD-10-CM | POA: Diagnosis not present

## 2015-12-04 DIAGNOSIS — Z6841 Body Mass Index (BMI) 40.0 and over, adult: Secondary | ICD-10-CM | POA: Diagnosis not present

## 2015-12-04 DIAGNOSIS — F319 Bipolar disorder, unspecified: Secondary | ICD-10-CM | POA: Diagnosis not present

## 2015-12-04 DIAGNOSIS — Z7984 Long term (current) use of oral hypoglycemic drugs: Secondary | ICD-10-CM | POA: Diagnosis not present

## 2015-12-04 DIAGNOSIS — R062 Wheezing: Secondary | ICD-10-CM | POA: Insufficient documentation

## 2015-12-04 DIAGNOSIS — T148 Other injury of unspecified body region: Secondary | ICD-10-CM | POA: Diagnosis not present

## 2015-12-04 DIAGNOSIS — E1165 Type 2 diabetes mellitus with hyperglycemia: Secondary | ICD-10-CM | POA: Diagnosis not present

## 2015-12-04 DIAGNOSIS — W06XXXA Fall from bed, initial encounter: Secondary | ICD-10-CM | POA: Insufficient documentation

## 2015-12-04 DIAGNOSIS — S4291XA Fracture of right shoulder girdle, part unspecified, initial encounter for closed fracture: Secondary | ICD-10-CM | POA: Diagnosis present

## 2015-12-04 DIAGNOSIS — K219 Gastro-esophageal reflux disease without esophagitis: Secondary | ICD-10-CM | POA: Insufficient documentation

## 2015-12-04 DIAGNOSIS — S43004A Unspecified dislocation of right shoulder joint, initial encounter: Secondary | ICD-10-CM | POA: Diagnosis not present

## 2015-12-04 DIAGNOSIS — S42254A Nondisplaced fracture of greater tuberosity of right humerus, initial encounter for closed fracture: Secondary | ICD-10-CM | POA: Insufficient documentation

## 2015-12-04 DIAGNOSIS — S43304A Dislocation of unspecified parts of right shoulder girdle, initial encounter: Principal | ICD-10-CM | POA: Insufficient documentation

## 2015-12-04 DIAGNOSIS — S43006A Unspecified dislocation of unspecified shoulder joint, initial encounter: Secondary | ICD-10-CM | POA: Insufficient documentation

## 2015-12-04 DIAGNOSIS — I10 Essential (primary) hypertension: Secondary | ICD-10-CM | POA: Diagnosis not present

## 2015-12-04 DIAGNOSIS — F329 Major depressive disorder, single episode, unspecified: Secondary | ICD-10-CM | POA: Diagnosis not present

## 2015-12-04 DIAGNOSIS — M25511 Pain in right shoulder: Secondary | ICD-10-CM | POA: Diagnosis present

## 2015-12-04 DIAGNOSIS — Z79899 Other long term (current) drug therapy: Secondary | ICD-10-CM | POA: Diagnosis not present

## 2015-12-04 DIAGNOSIS — S42251A Displaced fracture of greater tuberosity of right humerus, initial encounter for closed fracture: Secondary | ICD-10-CM

## 2015-12-04 DIAGNOSIS — F17290 Nicotine dependence, other tobacco product, uncomplicated: Secondary | ICD-10-CM | POA: Diagnosis not present

## 2015-12-04 DIAGNOSIS — G4730 Sleep apnea, unspecified: Secondary | ICD-10-CM | POA: Diagnosis not present

## 2015-12-04 DIAGNOSIS — Y999 Unspecified external cause status: Secondary | ICD-10-CM | POA: Diagnosis not present

## 2015-12-04 DIAGNOSIS — E119 Type 2 diabetes mellitus without complications: Secondary | ICD-10-CM | POA: Diagnosis not present

## 2015-12-04 DIAGNOSIS — M79601 Pain in right arm: Secondary | ICD-10-CM | POA: Diagnosis not present

## 2015-12-04 DIAGNOSIS — S42253A Displaced fracture of greater tuberosity of unspecified humerus, initial encounter for closed fracture: Secondary | ICD-10-CM | POA: Insufficient documentation

## 2015-12-04 HISTORY — PX: SHOULDER CLOSED REDUCTION: SHX1051

## 2015-12-04 LAB — COMPREHENSIVE METABOLIC PANEL
ALBUMIN: 4.2 g/dL (ref 3.5–5.0)
ALK PHOS: 69 U/L (ref 38–126)
ALT: 52 U/L (ref 17–63)
ANION GAP: 12 (ref 5–15)
AST: 44 U/L — ABNORMAL HIGH (ref 15–41)
BILIRUBIN TOTAL: 1 mg/dL (ref 0.3–1.2)
BUN: 15 mg/dL (ref 6–20)
CALCIUM: 9.3 mg/dL (ref 8.9–10.3)
CO2: 26 mmol/L (ref 22–32)
Chloride: 100 mmol/L — ABNORMAL LOW (ref 101–111)
Creatinine, Ser: 0.8 mg/dL (ref 0.61–1.24)
GFR calc non Af Amer: >60 mL/min (ref >60)
GLUCOSE: 429 mg/dL — AB (ref 65–99)
Potassium: 4.3 mmol/L (ref 3.5–5.1)
Sodium: 138 mmol/L (ref 135–145)
TOTAL PROTEIN: 7.7 g/dL (ref 6.5–8.1)

## 2015-12-04 LAB — GLUCOSE, CAPILLARY
GLUCOSE-CAPILLARY: 219 mg/dL — AB (ref 65–99)
GLUCOSE-CAPILLARY: 287 mg/dL — AB (ref 65–99)
GLUCOSE-CAPILLARY: 296 mg/dL — AB (ref 65–99)
GLUCOSE-CAPILLARY: 375 mg/dL — AB (ref 65–99)
Glucose-Capillary: 312 mg/dL — ABNORMAL HIGH (ref 65–99)
Glucose-Capillary: 307 mg/dL — ABNORMAL HIGH (ref 65–99)

## 2015-12-04 LAB — CBC WITH DIFFERENTIAL/PLATELET
BASOS ABS: 0 10*3/uL (ref 0.0–0.1)
BASOS PCT: 0 %
EOS ABS: 0 10*3/uL (ref 0.0–0.7)
EOS PCT: 0 %
HCT: 46.1 % (ref 39.0–52.0)
Hemoglobin: 16 g/dL (ref 13.0–17.0)
LYMPHS PCT: 16 %
Lymphs Abs: 2.1 10*3/uL (ref 0.7–4.0)
MCH: 30.9 pg (ref 26.0–34.0)
MCHC: 34.7 g/dL (ref 30.0–36.0)
MCV: 89.2 fL (ref 78.0–100.0)
MONO ABS: 1 10*3/uL (ref 0.1–1.0)
Monocytes Relative: 7 %
Neutro Abs: 10.2 10*3/uL — ABNORMAL HIGH (ref 1.7–7.7)
Neutrophils Relative %: 77 %
Platelets: 260 10*3/uL (ref 150–400)
RBC: 5.17 MIL/uL (ref 4.22–5.81)
RDW: 13 % (ref 11.5–15.5)
WBC: 13.3 10*3/uL — AB (ref 4.0–10.5)

## 2015-12-04 LAB — SURGICAL PCR SCREEN
MRSA, PCR: POSITIVE — AB
STAPHYLOCOCCUS AUREUS: POSITIVE — AB

## 2015-12-04 SURGERY — CLOSED REDUCTION, SHOULDER
Anesthesia: General | Site: Shoulder | Laterality: Right

## 2015-12-04 MED ORDER — LIDOCAINE HCL (CARDIAC) 20 MG/ML IV SOLN
INTRAVENOUS | Status: DC | PRN
  Administered 2015-12-04: 50 mg via INTRAVENOUS

## 2015-12-04 MED ORDER — ACETAMINOPHEN 325 MG PO TABS: 650.0000 mg | ORAL_TABLET | Freq: Four times a day (QID) | ORAL | Status: DC | PRN

## 2015-12-04 MED ORDER — SODIUM CHLORIDE 0.9 % IV SOLN
INTRAVENOUS | Status: AC
  Administered 2015-12-04: 16:00:00 via INTRAVENOUS

## 2015-12-04 MED ORDER — SORBITOL 70 % SOLN: 30.0000 mL | Freq: Every day | Status: DC | PRN

## 2015-12-04 MED ORDER — INSULIN ASPART 100 UNIT/ML ~~LOC~~ SOLN
12.0000 [IU] | Freq: Once | SUBCUTANEOUS | Status: AC
  Administered 2015-12-04: 12 [IU] via INTRAVENOUS

## 2015-12-04 MED ORDER — HYDROMORPHONE HCL 1 MG/ML IJ SOLN
1.0000 mg | Freq: Once | INTRAMUSCULAR | Status: AC
  Administered 2015-12-04: 1 mg via INTRAVENOUS
  Filled 2015-12-04: qty 1

## 2015-12-04 MED ORDER — PROPOFOL 10 MG/ML IV BOLUS
INTRAVENOUS | Status: DC | PRN
  Administered 2015-12-04: 200 mg via INTRAVENOUS

## 2015-12-04 MED ORDER — PROPOFOL 10 MG/ML IV BOLUS
200.0000 mg | Freq: Once | INTRAVENOUS | Status: AC
  Administered 2015-12-04: 200 mg via INTRAVENOUS
  Filled 2015-12-04: qty 20

## 2015-12-04 MED ORDER — SODIUM CHLORIDE 0.9 % IV SOLN: 250.0000 mL | INTRAVENOUS | Status: DC | PRN

## 2015-12-04 MED ORDER — PROPOFOL 10 MG/ML IV BOLUS
INTRAVENOUS | Status: AC
  Filled 2015-12-04: qty 40

## 2015-12-04 MED ORDER — HYDROMORPHONE HCL 1 MG/ML IJ SOLN
INTRAMUSCULAR | Status: DC | PRN
  Administered 2015-12-04: 1 mg via INTRAVENOUS

## 2015-12-04 MED ORDER — METOPROLOL TARTRATE 5 MG/5ML IV SOLN
5.0000 mg | Freq: Once | INTRAVENOUS | Status: AC
  Administered 2015-12-04: 5 mg via INTRAVENOUS

## 2015-12-04 MED ORDER — METOPROLOL TARTRATE 5 MG/5ML IV SOLN
INTRAVENOUS | Status: AC
  Filled 2015-12-04: qty 5

## 2015-12-04 MED ORDER — MAGNESIUM CITRATE PO SOLN: 1.0000 | Freq: Once | ORAL | Status: DC | PRN

## 2015-12-04 MED ORDER — SODIUM CHLORIDE 0.9% FLUSH
3.0000 mL | INTRAVENOUS | Status: DC | PRN
  Administered 2015-12-04: 3 mL via INTRAVENOUS
  Filled 2015-12-04: qty 3

## 2015-12-04 MED ORDER — CHLORHEXIDINE GLUCONATE CLOTH 2 % EX PADS
6.0000 | MEDICATED_PAD | Freq: Every day | CUTANEOUS | Status: DC
  Administered 2015-12-04: 6 via TOPICAL

## 2015-12-04 MED ORDER — POLYETHYLENE GLYCOL 3350 17 G PO PACK: 17.0000 g | PACK | Freq: Every day | ORAL | Status: DC | PRN

## 2015-12-04 MED ORDER — INSULIN ASPART 100 UNIT/ML ~~LOC~~ SOLN
0.0000 [IU] | SUBCUTANEOUS | Status: DC
  Administered 2015-12-04: 12 [IU] via SUBCUTANEOUS
  Administered 2015-12-04: 16 [IU] via SUBCUTANEOUS
  Administered 2015-12-04: 8 [IU] via SUBCUTANEOUS
  Administered 2015-12-04: 12 [IU] via SUBCUTANEOUS
  Administered 2015-12-05 (×2): 4 [IU] via SUBCUTANEOUS

## 2015-12-04 MED ORDER — FENTANYL CITRATE (PF) 100 MCG/2ML IJ SOLN: 25.0000 ug | INTRAMUSCULAR | Status: DC | PRN

## 2015-12-04 MED ORDER — IPRATROPIUM-ALBUTEROL 0.5-2.5 (3) MG/3ML IN SOLN
RESPIRATORY_TRACT | Status: AC
  Filled 2015-12-04: qty 3

## 2015-12-04 MED ORDER — ACETAMINOPHEN 650 MG RE SUPP: 650.0000 mg | Freq: Four times a day (QID) | RECTAL | Status: DC | PRN

## 2015-12-04 MED ORDER — HYDROCHLOROTHIAZIDE 25 MG PO TABS
25.0000 mg | ORAL_TABLET | Freq: Every day | ORAL | Status: DC
  Administered 2015-12-04 – 2015-12-05 (×2): 25 mg via ORAL
  Filled 2015-12-04 (×2): qty 1

## 2015-12-04 MED ORDER — SODIUM CHLORIDE 0.9 % IV BOLUS (SEPSIS)
500.0000 mL | Freq: Once | INTRAVENOUS | Status: AC
  Administered 2015-12-04: 500 mL via INTRAVENOUS

## 2015-12-04 MED ORDER — SUCCINYLCHOLINE CHLORIDE 20 MG/ML IJ SOLN
INTRAMUSCULAR | Status: AC
  Filled 2015-12-04: qty 2

## 2015-12-04 MED ORDER — HYDROMORPHONE HCL 1 MG/ML IJ SOLN: 0.5000 mg | INTRAMUSCULAR | Status: DC | PRN

## 2015-12-04 MED ORDER — MUPIROCIN 2 % EX OINT
1.0000 "application " | TOPICAL_OINTMENT | Freq: Two times a day (BID) | CUTANEOUS | Status: DC
  Administered 2015-12-04 – 2015-12-05 (×3): 1 via NASAL
  Filled 2015-12-04 (×3): qty 22

## 2015-12-04 MED ORDER — PROPOFOL 10 MG/ML IV BOLUS
INTRAVENOUS | Status: DC | PRN
  Administered 2015-12-04: 40 mg via INTRAVENOUS
  Administered 2015-12-04 (×2): 20 mg via INTRAVENOUS

## 2015-12-04 MED ORDER — FENTANYL CITRATE (PF) 100 MCG/2ML IJ SOLN
INTRAMUSCULAR | Status: AC
  Filled 2015-12-04: qty 2

## 2015-12-04 MED ORDER — METFORMIN HCL ER 500 MG PO TB24
1000.0000 mg | ORAL_TABLET | Freq: Every day | ORAL | Status: DC
  Administered 2015-12-04 – 2015-12-05 (×2): 1000 mg via ORAL
  Filled 2015-12-04: qty 2

## 2015-12-04 MED ORDER — ESCITALOPRAM OXALATE 10 MG PO TABS
20.0000 mg | ORAL_TABLET | Freq: Every day | ORAL | Status: DC
  Administered 2015-12-04 – 2015-12-05 (×2): 20 mg via ORAL
  Filled 2015-12-04 (×2): qty 2

## 2015-12-04 MED ORDER — SUCCINYLCHOLINE CHLORIDE 20 MG/ML IJ SOLN
INTRAMUSCULAR | Status: AC
  Filled 2015-12-04: qty 1

## 2015-12-04 MED ORDER — DOCUSATE SODIUM 100 MG PO CAPS
100.0000 mg | ORAL_CAPSULE | Freq: Two times a day (BID) | ORAL | Status: DC
  Administered 2015-12-04 – 2015-12-05 (×3): 100 mg via ORAL
  Filled 2015-12-04 (×3): qty 1

## 2015-12-04 MED ORDER — MIDAZOLAM HCL 2 MG/2ML IJ SOLN
1.0000 mg | INTRAMUSCULAR | Status: DC | PRN
Start: 2015-12-04 — End: 2015-12-04
  Administered 2015-12-04: 2 mg via INTRAVENOUS
  Filled 2015-12-04: qty 2

## 2015-12-04 MED ORDER — ONDANSETRON HCL 4 MG/2ML IJ SOLN: 4.0000 mg | Freq: Once | INTRAMUSCULAR | Status: DC | PRN

## 2015-12-04 MED ORDER — FENTANYL CITRATE (PF) 100 MCG/2ML IJ SOLN
INTRAMUSCULAR | Status: DC | PRN
  Administered 2015-12-04 (×2): 50 ug via INTRAVENOUS

## 2015-12-04 MED ORDER — HYDROMORPHONE HCL 2 MG/ML IJ SOLN
INTRAMUSCULAR | Status: AC
  Filled 2015-12-04: qty 1

## 2015-12-04 MED ORDER — METOPROLOL TARTRATE 5 MG/5ML IV SOLN
INTRAVENOUS | Status: DC | PRN
  Administered 2015-12-04: 5 mg via INTRAVENOUS

## 2015-12-04 MED ORDER — LISINOPRIL 10 MG PO TABS
40.0000 mg | ORAL_TABLET | Freq: Every day | ORAL | Status: DC
  Administered 2015-12-04 – 2015-12-05 (×2): 40 mg via ORAL
  Filled 2015-12-04 (×2): qty 4

## 2015-12-04 MED ORDER — HYDROCODONE-ACETAMINOPHEN 5-325 MG PO TABS
1.0000 | ORAL_TABLET | ORAL | Status: DC | PRN
Start: 2015-12-04 — End: 2015-12-05
  Administered 2015-12-04 – 2015-12-05 (×3): 2 via ORAL
  Filled 2015-12-04 (×3): qty 2

## 2015-12-04 MED ORDER — IPRATROPIUM-ALBUTEROL 0.5-2.5 (3) MG/3ML IN SOLN
3.0000 mL | Freq: Once | RESPIRATORY_TRACT | Status: AC
  Administered 2015-12-04: 3 mL via RESPIRATORY_TRACT

## 2015-12-04 MED ORDER — LACTATED RINGERS IV SOLN: INTRAVENOUS | Status: DC | PRN

## 2015-12-04 MED ORDER — ONDANSETRON HCL 4 MG/2ML IJ SOLN
4.0000 mg | Freq: Once | INTRAMUSCULAR | Status: AC
  Administered 2015-12-04: 4 mg via INTRAVENOUS
  Filled 2015-12-04: qty 2

## 2015-12-04 MED ORDER — CANAGLIFLOZIN 100 MG PO TABS
100.0000 mg | ORAL_TABLET | Freq: Every day | ORAL | Status: DC
  Administered 2015-12-05: 100 mg via ORAL
  Filled 2015-12-04 (×3): qty 1

## 2015-12-04 MED ORDER — SODIUM CHLORIDE 0.9 % IV SOLN
INTRAVENOUS | Status: DC
  Administered 2015-12-04: 1000 mL via INTRAVENOUS
  Administered 2015-12-04: 08:00:00 via INTRAVENOUS

## 2015-12-04 MED ORDER — SUCCINYLCHOLINE CHLORIDE 20 MG/ML IJ SOLN
INTRAMUSCULAR | Status: DC | PRN
  Administered 2015-12-04: 400 mg via INTRAVENOUS

## 2015-12-04 MED ORDER — SODIUM CHLORIDE 0.9% FLUSH
3.0000 mL | Freq: Two times a day (BID) | INTRAVENOUS | Status: DC
  Administered 2015-12-05: 3 mL via INTRAVENOUS

## 2015-12-04 MED ORDER — TRAZODONE HCL 50 MG PO TABS: 75.0000 mg | ORAL_TABLET | Freq: Every evening | ORAL | Status: DC | PRN

## 2015-12-04 SURGICAL SUPPLY — 2 items
CLOTH BEACON ORANGE TIMEOUT ST (SAFETY) ×3 IMPLANT
KIT ROOM TURNOVER APOR (KITS) ×3 IMPLANT

## 2015-12-04 NOTE — Op Note (Signed)
12/04/2015  8:16 AM  PATIENT:  Kyle Collins  30 y.o. male  PRE-OPERATIVE DIAGNOSIS:  right shoulder fracture dislocation  POST-OPERATIVE DIAGNOSIS:  right shoulder fracture dislocation  PROCEDURE:  Procedure(s): CLOSED REDUCTION SHOULDER (Right)  SURGEON:  Surgeon(s) and Role:    * Carole Civil, MD - Primary  PHYSICIAN ASSISTANT:   ASSISTANTS: none   ANESTHESIA:   general  EBL:     BLOOD ADMINISTERED:none  DRAINS: none   LOCAL MEDICATIONS USED:  NONE  SPECIMEN:  No Specimen  DISPOSITION OF SPECIMEN:  N/A  COUNTS:  No instruments were used  TOURNIQUET:  * No tourniquets in log *  DICTATION: .Dragon Dictation  PLAN OF CARE: Admit to inpatient   PATIENT DISPOSITION:  PACU - hemodynamically stable.   Delay start of Pharmacological VTE agent (>24hrs) due to surgical blood loss or risk of bleeding: not applicable  Procedure was done as follows The surgical site was marked in the preop area and the chart was reviewed surgical site was confirmed  Patient was taken to surgery  Placed on the operating table  Gen. anesthesia plus succinyl choline  After timeout shoulder was manipulated with traction countertraction and visible clunk noted.  We had the C-arm in place and he was taken through range of motion and he was found to be stable. The AP view is oblique we tried a Philip Aspen view it was better and showed reduction of the greater tuberosity fragment we took the scapular Y-view show shoulder reduction  Shoulder was stable with his arm at his side even in extremes of external rotation  Extubation taken this recovery room  The patient's glucose was 400+ so he will be admitted for hyperglycemia.

## 2015-12-04 NOTE — Addendum Note (Signed)
Addendum  created 12/04/15 [id number] by Mickel Baas, CRNA   Modules edited: Charges VN

## 2015-12-04 NOTE — ED Provider Notes (Addendum)
CSN: [id number]     Arrival date & time 12/04/15  0026 History   First MD Initiated Contact with Patient 12/04/15 0145    Chief Complaint  Patient presents with  . Arm Pain     (Consider location/radiation/quality/duration/timing/severity/associated sxs/prior Treatment) HPI  patient states he was getting out of bed in a pillow was on the floor and he slipped and fell and landed hitting his right shoulder. He states his head hit the wall and made a hole in the sheet rock. However he states he did not have loss of consciousness and he does not have a headache now. He states his main pain is in his right shoulder. He states he has right handed.    Past Medical History  Diagnosis Date  . Hypertension   . Diabetes mellitus   . Thyroid disease   . Sleep apnea   . Depression   . Bipolar disorder ([place])   . GERD (gastroesophageal reflux disease)   . Headache    Past Surgical History  Procedure Laterality Date  . Hernia repair    . Tooth extraction  spring 2016    top left    Family History  Problem Relation Age of Onset  . Diabetes Mother   . Diabetes Father    Social History  Substance Use Topics  . Smoking status: Current Every Day Smoker    Types: E-cigarettes  . Smokeless tobacco: None  . Alcohol Use: No  lives alone On disability for bipolar, diabetes  Review of Systems  All other systems reviewed and are negative.     Allergies  Methylphenidate derivatives and Ritalin  Home Medications   Prior to Admission medications   Medication Sig Start Date End Date Taking? Authorizing Provider  escitalopram (LEXAPRO) 20 MG tablet Take 20 mg by mouth daily.    Historical Provider, MD  hydrochlorothiazide (HYDRODIURIL) 25 MG tablet Take 1 tablet (25 mg total) by mouth daily. 10/26/15   Cassandria Anger, MD  INVOKANA 100 MG TABS tablet TAKE ONE TABLET BY MOUTH DAILY BEFORE BREAKFAST. 10/29/15   Cassandria Anger, MD  lisinopril (PRINIVIL,ZESTRIL) 40 MG tablet Take 1  tablet (40 mg total) by mouth daily. 06/26/14   Milton Ferguson, MD  metFORMIN (GLUCOPHAGE-XR) 500 MG 24 hr tablet Take 1,000 mg by mouth daily after breakfast.    Historical Provider, MD  traZODone (DESYREL) 150 MG tablet Take 75-150 mg by mouth at bedtime as needed for sleep.     Historical Provider, MD   BP 144/97 mmHg  Pulse 92  Temp(Src) 98.2 F (36.8 C)  Resp 18  Ht 6\' 1"  (1.854 m)  Wt 350 lb (158.759 kg)  BMI 46.19 kg/m2  SpO2 93%  Vital signs normal   Physical Exam  Constitutional: He is oriented to person, place, and time. He appears well-developed and well-nourished.  Non-toxic appearance. He does not appear ill. No distress.  HENT:  Head: Normocephalic and atraumatic.  Right Ear: External ear normal.  Left Ear: External ear normal.  Nose: Nose normal. No mucosal edema or rhinorrhea.  Mouth/Throat: Oropharynx is clear and moist and mucous membranes are normal. No dental abscesses or uvula swelling.  Eyes: Conjunctivae and EOM are normal. Pupils are equal, round, and reactive to light.  Neck: Normal range of motion and full passive range of motion without pain. Neck supple.  Cardiovascular: Normal rate, regular rhythm and normal heart sounds.  Exam reveals no gallop and no friction rub.   No murmur heard. Pulmonary/Chest:  Effort normal and breath sounds normal. No respiratory distress. He has no wheezes. He has no rhonchi. He has no rales. He exhibits no tenderness and no crepitus.  Abdominal: Soft. Normal appearance and bowel sounds are normal. He exhibits no distension. There is no tenderness. There is no rebound and no guarding.  Musculoskeletal: Normal range of motion. He exhibits no edema or tenderness.  Moves all extremities well except his right upper extremity. He appears to have a dislocation of his right shoulder with emptiness in the subdeltoid space.. However he also appears to have a deformity of the upper arm near the shoulder joint. He has intact sensation and  range of motion of his fingers. He has good distal pulses.  Neurological: He is alert and oriented to person, place, and time. He has normal strength. No cranial nerve deficit.  Skin: Skin is warm, dry and intact. No rash noted. No erythema. No pallor.  Psychiatric: He has a normal mood and affect. His speech is normal and behavior is normal. His mood appears not anxious.  Nursing note and vitals reviewed.   ED Course  Procedures (including critical care time)  Medications  propofol (DIPRIVAN) 10 mg/mL bolus/IV push (20 mg Intravenous New Bag/Given 12/04/15 0414)  HYDROmorphone (DILAUDID) 2 MG/ML injection (not administered)  HYDROmorphone (DILAUDID) injection (1 mg Intravenous Given 12/04/15 0424)  HYDROmorphone (DILAUDID) injection 1 mg (1 mg Intravenous Given 12/04/15 0203)  ondansetron (ZOFRAN) injection 4 mg (4 mg Intravenous Given 12/04/15 0157)  sodium chloride 0.9 % bolus 500 mL (0 mLs Intravenous Stopped 12/04/15 0332)  propofol (DIPRIVAN) 10 mg/mL bolus/IV push 200 mg (0 mg Intravenous Stopped 12/04/15 0424)    Patient was given IV pain medications.  Procedure sedation was done. I attempted to reduce his dislocation however there was a lot of popping and cracking felt and I was never able to fully reduce the fracture dislocation. I attempted by doing traction and modified Kocher's maneuver.   05:17 Dr Aline Brochure, orthopedics will see in the ED this morning  06:30 AM Dr Aline Brochure here in the ED, has reviewed his Xrays and is going to see patient.    Procedural sedation Performed by: Janice Norrie Consent: Verbal consent obtained. Risks and benefits: risks, benefits and alternatives were discussed Required items: required blood products, implants, devices, and special equipment available Patient identity confirmed: arm band and provided demographic data Time out: Immediately prior to procedure a "time out" was called to verify the correct patient, procedure, equipment, support staff and  site/side marked as required.  Sedation type: moderate (conscious) sedation NPO time confirmed and considedered  Sedatives: PROPOFOL  160 mg  Physician Time at Bedside: 20 min  Vitals: Vital signs were monitored during sedation. Cardiac Monitor, pulse oximeter Patient tolerance: Patient tolerated the procedure well with no immediate complications. Comments: Pt with uneventful recovered. Returned to pre-procedural sedation baseline     Imaging Review Dg Shoulder Right  12/04/2015  CLINICAL DATA:  [place] at home. EXAM: RIGHT SHOULDER - 2+ VIEW COMPARISON:  None. FINDINGS: There is an anterior subcoracoid glenohumeral dislocation. There is a comminuted fracture of the greater tuberosity. Glenoid appears grossly intact. IMPRESSION: Fracture dislocation of the glenohumeral joint. Electronically Signed   By: Andreas Newport M.D.   On: 12/04/2015 02:37   Dg Shoulder Right Port  12/04/2015  CLINICAL DATA:  Right shoulder dislocation EXAM: PORTABLE RIGHT SHOULDER - 2+ VIEW COMPARISON:  12/04/2015 at 02:19 FINDINGS: Two views of the right shoulder obtained portably demonstrate persistent anterior  subcoracoid dislocation. Greater tuberosity fracture fragments are again evident. IMPRESSION: Persistent right shoulder dislocation. Electronically Signed   By: Andreas Newport M.D.   On: 12/04/2015 04:41   Dg Humerus Right  12/04/2015  CLINICAL DATA:  Ground level fall tonight. EXAM: RIGHT HUMERUS - 2+ VIEW COMPARISON:  None. FINDINGS: There is a fracture dislocation of the right shoulder. The dislocation is anterior subcoracoid. The fracture fragment appears to the include most of the greater tuberosity. Glenoid is grossly intact. AC joint is grossly intact. Remainder of the humerus is grossly intact. Study limited due to positioning challenges and immobility of the shoulder. IMPRESSION: Fracture dislocation at the glenohumeral joint. Electronically Signed   By: Andreas Newport M.D.   On: 12/04/2015 01:36    I have personally reviewed and evaluated these images and lab results as part of my medical decision-making.   EKG Interpretation None      MDM   Final diagnoses:  Dislocation, shoulder closed, right, initial encounter  Fracture, humerus, greater tuberosity, right, closed, initial encounter    Disposition pending Dr Otho Bellows, MD, Barbette Or, MD 12/04/15 Delrae Rend  Rolland Porter, MD 12/04/15 0630

## 2015-12-04 NOTE — Interval H&P Note (Signed)
History and Physical Interval Note:  12/04/2015 7:25 AM  Kyle Collins  has presented today for surgery, with the diagnosis of right shoulder fracture dislocation  The various methods of treatment have been discussed with the patient and family. After consideration of risks, benefits and other options for treatment, the patient has consented to  Procedure(s): CLOSED REDUCTION SHOULDER (Right) as a surgical intervention .  The patient's history has been reviewed, patient examined, no change in status, stable for surgery.  I have reviewed the patient's chart and labs.  Questions were answered to the patient's satisfaction.     Arther Abbott

## 2015-12-04 NOTE — Anesthesia Procedure Notes (Signed)
Procedure Name: Intubation Date/Time: 12/04/2015 8:04 AM Performed by: Andree Elk, AMY A Pre-anesthesia Checklist: Patient identified, Patient being monitored, Timeout performed, Emergency Drugs available and Suction available Patient Re-evaluated:Patient Re-evaluated prior to inductionOxygen Delivery Method: Circle System Utilized and Circle system utilized Preoxygenation: Pre-oxygenation with 100% oxygen Intubation Type: IV induction, Rapid sequence and Cricoid Pressure applied Ventilation: Mask ventilation without difficulty Laryngoscope Size: 3 and Miller Grade View: Grade I Tube type: Oral Tube size: 7.0 mm Number of attempts: 1 Airway Equipment and Method: Stylet Placement Confirmation: ETT inserted through vocal cords under direct vision,  positive ETCO2 and breath sounds checked- equal and bilateral Secured at: 21 cm Tube secured with: Tape Dental Injury: Teeth and Oropharynx as per pre-operative assessment

## 2015-12-04 NOTE — Transfer of Care (Signed)
Immediate Anesthesia Transfer of Care Note  Patient: Kyle Collins  Procedure(s) Performed: Procedure(s): CLOSED REDUCTION SHOULDER (Right)  Patient Location: PACU  Anesthesia Type:General  Level of Consciousness: awake, alert , oriented and patient cooperative  Airway & Oxygen Therapy: Patient Spontanous Breathing and Patient connected to nasal cannula oxygen  Post-op Assessment: Report given to RN and Post -op Vital signs reviewed and stable  Post vital signs: Reviewed and stable  Last Vitals:  Filed Vitals:   12/04/15 0750 12/04/15 0755  BP: 143/87   Pulse:    Temp:    Resp: 11 16    Last Pain:  Filed Vitals:   12/04/15 0757  PainSc: 7          Complications: No apparent anesthesia complications

## 2015-12-04 NOTE — ED Notes (Signed)
During sedation procedure pt began to kick and flail on bed, had to call for additional help.

## 2015-12-04 NOTE — Consult Note (Signed)
ER CONSULT [id number]    Patient ID: Kyle Collins, male   DOB: 03-28-1986, 30 y.o.   MRN: [id number]  New patient  Requested by:Dr Tomi Bamberger  Reason for: irreducible fracture dislocation of the right shoulder   Chief Complaint  Patient presents with  . Arm Pain     Kyle Collins is a 30 y.o. male.   HPI 30 yo male with sllep apnea?CPAP, bipolar disorder, DM, HTN, fell getting out of bed and sustained a fracture dilsocation of the shoulder. The ER tried closed reduction with 160 mg of propofol and it was not successful He co numbness in the C5 deltoid area and AIN.  Location-right shoulder  Duration-today Severity-high Quality-numb, heavy, dull Modified by-movement   Review of Systems (all) Review of Systems  Constitutional: Negative for fever and chills.  HENT: Negative for congestion.   Eyes: Negative for photophobia.  Respiratory: Positive for shortness of breath and wheezing.   Cardiovascular: Negative for chest pain.  Gastrointestinal: Negative for abdominal pain.  Genitourinary: Negative for frequency.  Neurological: Positive for tingling and sensory change. Negative for headaches.  Endo/Heme/Allergies: Positive for polydipsia. Does not bruise/bleed easily.  Psychiatric/Behavioral: Positive for depression.    Past Medical History  Diagnosis Date  . Hypertension   . Diabetes mellitus   . Thyroid disease   . Sleep apnea   . Depression   . Bipolar disorder ([place])   . GERD (gastroesophageal reflux disease)   . Headache     Past Surgical History  Procedure Laterality Date  . Hernia repair    . Tooth extraction  spring 2016    top left     Family History  Problem Relation Age of Onset  . Diabetes Mother   . Diabetes Father   the family history was reviewed and there was no history of osteoporotic fracture or family history of anesthesia risk factors or bleeding    Social History Social History  Substance Use Topics  . Smoking status: Current Every Day  Smoker    Types: E-cigarettes  . Smokeless tobacco: None  . Alcohol Use: No    Allergies  Allergen Reactions  . Methylphenidate Derivatives Other (See Comments)    Patient states it made him "act like a zombie" when given as a child  . Ritalin [Methylphenidate Hcl]     Current Facility-Administered Medications  Medication Dose Route Frequency Provider Last Rate Last Dose  . HYDROmorphone (DILAUDID) 2 MG/ML injection           . HYDROmorphone (DILAUDID) injection   Intravenous PRN Rolland Porter, MD   1 mg at 12/04/15 0424  . propofol (DIPRIVAN) 10 mg/mL bolus/IV push   Intravenous Continuous PRN Rolland Porter, MD   20 mg at 12/04/15 0414   Current Outpatient Prescriptions  Medication Sig Dispense Refill  . escitalopram (LEXAPRO) 20 MG tablet Take 20 mg by mouth daily.    . hydrochlorothiazide (HYDRODIURIL) 25 MG tablet Take 1 tablet (25 mg total) by mouth daily. 90 tablet 3  . INVOKANA 100 MG TABS tablet TAKE ONE TABLET BY MOUTH DAILY BEFORE BREAKFAST. 30 tablet 2  . lisinopril (PRINIVIL,ZESTRIL) 40 MG tablet Take 1 tablet (40 mg total) by mouth daily. 30 tablet 1  . metFORMIN (GLUCOPHAGE-XR) 500 MG 24 hr tablet Take 1,000 mg by mouth daily after breakfast.    . traZODone (DESYREL) 150 MG tablet Take 75-150 mg by mouth at bedtime as needed for sleep.        Physical Exam(=30) Blood  pressure 179/117, pulse 116, temperature 98.2 F (36.8 C), resp. rate 18, height 6\' 1"  (1.854 m), weight 350 lb (158.759 kg), SpO2 90 %. Gen. Appearance normal with short stature and barrel chest  Peripheral vascular system radial artery and pulse normal in right and left wrist Lymph nodes normal cervical spine  Gait not able to assess due to fracture and sedation   Left Upper extremity  Inspection revealed no malalignment or asymmetry  Assessment of range of motion: Full range of motion was recorded  Assessment of stability: Elbow wrist and hand and shoulder were stable  Assessment of muscle strength and  tone revealed grade 5 muscle strength and normal muscle tone  Skin was normal without rash lesion or ulceration  Right upper extremity  Inspection revealed deformity of deltoid   Assessment of range of motion: no active ange of motion was recorded  Assessment of stability: Elbow wrist and hand stable, shoulder dislocated  Assessment of muscle strength and tone revealed  normal muscle tone  Skin was normal without rash lesion or ulceration  Right Lower extremity  Inspection revealed no malalignment or asymmetry  Assessment of range of motion: Full range of motion was recorded  Assessment of stability: Elbow wrist and hand and shoulder were stable  Assessment of muscle strength and tone revealed grade 5 muscle strength and normal muscle tone  Skin was normal without rash lesion or ulceration  Left lower extremity  Inspection revealed no malalignment or asymmetry  Assessment of range of motion: Full range of motion was recorded  Assessment of stability: Elbow wrist and hand and shoulder were stable  Assessment of muscle strength and tone revealed grade 5 muscle strength and normal muscle tone  Skin was normal without rash lesion or ulceration   Coordination was not  tested by finger-to-nose nose because of dislocation  Deep tendon reflexes were 2+ in the lower extremities Examination of sensation by touch was ab- normal right deltoid and right hand AIN   Mental status  Oriented to time person and place normal  Mood and affect normal without anxiety or agitation  Dx:   Data Reviewed  I reviewed the images and the reports and my independent interpretation is  i see a greater tuberosity fracture and glenohumeral disloaction of the right shoudler   Assessment  Irreducible right shoulder fracture and dislocation in setting of difficult airway; I informed him that this should be done in controlled setting in the OR and elective cuff repair and fracture fixation can be done in 2-3  weeks   Plan   Closed reduction in OR    Carole Civil MD

## 2015-12-04 NOTE — Progress Notes (Signed)
Inpatient Diabetes Program Recommendations  AACE/ADA: New Consensus Statement on Inpatient Glycemic Control (2015)  Target Ranges:  Prepandial:   less than 140 mg/dL      Peak postprandial:   less than 180 mg/dL (1-2 hours)      Critically ill patients:  140 - 180 mg/dL   Results for ALROY, PORTELA (MRN [id number]) as of 12/04/2015 11:58  Ref. Range 12/04/2015 08:26 12/04/2015 11:23  Glucose-Capillary Latest Ref Range: 65-99 mg/dL 312 (H) 287 (H)  5/9 Discussed the possibility of using insulin to manage his diabetes.  Pt seemed to be willing to consider it if necessary.  Recommend starting pt at 16 units of Lantus (0.1 Units/kg) with sensitive correction tid with meals.  If pt is going home on insulin, recommend an insulin starter kit. [place], CDE. M.Ed. Pager [phone] Inpatient Diabetes Coordinator

## 2015-12-04 NOTE — ED Notes (Signed)
Pt fell and now c/o rt arm pain.

## 2015-12-04 NOTE — H&P (View-Only) (Signed)
ER CONSULT [id number]    Patient ID: Kyle Collins, male   DOB: 03-28-1986, 30 y.o.   MRN: [id number]  New patient  Requested by:Dr Tomi Bamberger  Reason for: irreducible fracture dislocation of the right shoulder   Chief Complaint  Patient presents with  . Arm Pain     Kyle Collins is a 30 y.o. male.   HPI 30 yo male with sllep apnea?CPAP, bipolar disorder, DM, HTN, fell getting out of bed and sustained a fracture dilsocation of the shoulder. The ER tried closed reduction with 160 mg of propofol and it was not successful He co numbness in the C5 deltoid area and AIN.  Location-right shoulder  Duration-today Severity-high Quality-numb, heavy, dull Modified by-movement   Review of Systems (all) Review of Systems  Constitutional: Negative for fever and chills.  HENT: Negative for congestion.   Eyes: Negative for photophobia.  Respiratory: Positive for shortness of breath and wheezing.   Cardiovascular: Negative for chest pain.  Gastrointestinal: Negative for abdominal pain.  Genitourinary: Negative for frequency.  Neurological: Positive for tingling and sensory change. Negative for headaches.  Endo/Heme/Allergies: Positive for polydipsia. Does not bruise/bleed easily.  Psychiatric/Behavioral: Positive for depression.    Past Medical History  Diagnosis Date  . Hypertension   . Diabetes mellitus   . Thyroid disease   . Sleep apnea   . Depression   . Bipolar disorder ([place])   . GERD (gastroesophageal reflux disease)   . Headache     Past Surgical History  Procedure Laterality Date  . Hernia repair    . Tooth extraction  spring 2016    top left     Family History  Problem Relation Age of Onset  . Diabetes Mother   . Diabetes Father   the family history was reviewed and there was no history of osteoporotic fracture or family history of anesthesia risk factors or bleeding    Social History Social History  Substance Use Topics  . Smoking status: Current Every Day  Smoker    Types: E-cigarettes  . Smokeless tobacco: None  . Alcohol Use: No    Allergies  Allergen Reactions  . Methylphenidate Derivatives Other (See Comments)    Patient states it made him "act like a zombie" when given as a child  . Ritalin [Methylphenidate Hcl]     Current Facility-Administered Medications  Medication Dose Route Frequency Provider Last Rate Last Dose  . HYDROmorphone (DILAUDID) 2 MG/ML injection           . HYDROmorphone (DILAUDID) injection   Intravenous PRN Rolland Porter, MD   1 mg at 12/04/15 0424  . propofol (DIPRIVAN) 10 mg/mL bolus/IV push   Intravenous Continuous PRN Rolland Porter, MD   20 mg at 12/04/15 0414   Current Outpatient Prescriptions  Medication Sig Dispense Refill  . escitalopram (LEXAPRO) 20 MG tablet Take 20 mg by mouth daily.    . hydrochlorothiazide (HYDRODIURIL) 25 MG tablet Take 1 tablet (25 mg total) by mouth daily. 90 tablet 3  . INVOKANA 100 MG TABS tablet TAKE ONE TABLET BY MOUTH DAILY BEFORE BREAKFAST. 30 tablet 2  . lisinopril (PRINIVIL,ZESTRIL) 40 MG tablet Take 1 tablet (40 mg total) by mouth daily. 30 tablet 1  . metFORMIN (GLUCOPHAGE-XR) 500 MG 24 hr tablet Take 1,000 mg by mouth daily after breakfast.    . traZODone (DESYREL) 150 MG tablet Take 75-150 mg by mouth at bedtime as needed for sleep.        Physical Exam(=30) Blood  pressure 179/117, pulse 116, temperature 98.2 F (36.8 C), resp. rate 18, height 6\' 1"  (1.854 m), weight 350 lb (158.759 kg), SpO2 90 %. Gen. Appearance normal with short stature and barrel chest  Peripheral vascular system radial artery and pulse normal in right and left wrist Lymph nodes normal cervical spine  Gait not able to assess due to fracture and sedation   Left Upper extremity  Inspection revealed no malalignment or asymmetry  Assessment of range of motion: Full range of motion was recorded  Assessment of stability: Elbow wrist and hand and shoulder were stable  Assessment of muscle strength and  tone revealed grade 5 muscle strength and normal muscle tone  Skin was normal without rash lesion or ulceration  Right upper extremity  Inspection revealed deformity of deltoid   Assessment of range of motion: no active ange of motion was recorded  Assessment of stability: Elbow wrist and hand stable, shoulder dislocated  Assessment of muscle strength and tone revealed  normal muscle tone  Skin was normal without rash lesion or ulceration  Right Lower extremity  Inspection revealed no malalignment or asymmetry  Assessment of range of motion: Full range of motion was recorded  Assessment of stability: Elbow wrist and hand and shoulder were stable  Assessment of muscle strength and tone revealed grade 5 muscle strength and normal muscle tone  Skin was normal without rash lesion or ulceration  Left lower extremity  Inspection revealed no malalignment or asymmetry  Assessment of range of motion: Full range of motion was recorded  Assessment of stability: Elbow wrist and hand and shoulder were stable  Assessment of muscle strength and tone revealed grade 5 muscle strength and normal muscle tone  Skin was normal without rash lesion or ulceration   Coordination was not  tested by finger-to-nose nose because of dislocation  Deep tendon reflexes were 2+ in the lower extremities Examination of sensation by touch was ab- normal right deltoid and right hand AIN   Mental status  Oriented to time person and place normal  Mood and affect normal without anxiety or agitation  Dx:   Data Reviewed  I reviewed the images and the reports and my independent interpretation is  i see a greater tuberosity fracture and glenohumeral disloaction of the right shoudler   Assessment  Irreducible right shoulder fracture and dislocation in setting of difficult airway; I informed him that this should be done in controlled setting in the OR and elective cuff repair and fracture fixation can be done in 2-3  weeks   Plan   Closed reduction in OR    Carole Civil MD

## 2015-12-04 NOTE — Anesthesia Postprocedure Evaluation (Signed)
Anesthesia Post Note  Patient: Kyle Collins  Procedure(s) Performed: Procedure(s) (LRB): CLOSED REDUCTION SHOULDER (Right)  Patient location during evaluation: PACU Anesthesia Type: General Level of consciousness: awake and alert and oriented Pain management: pain level controlled Vital Signs Assessment: post-procedure vital signs reviewed and stable Respiratory status: spontaneous breathing and patient connected to nasal cannula oxygen Cardiovascular status: stable Postop Assessment: no signs of nausea or vomiting Anesthetic complications: no    Last Vitals:  Filed Vitals:   12/04/15 0750 12/04/15 0755  BP: 143/87   Pulse:    Temp:    Resp: 11 16    Last Pain:  Filed Vitals:   12/04/15 0757  PainSc: 7                  ADAMS, AMY A

## 2015-12-04 NOTE — ED Notes (Signed)
Pt says he tripped getting out of bed landing on his right arm.

## 2015-12-04 NOTE — Brief Op Note (Signed)
12/04/2015  8:16 AM  PATIENT:  Kyle Collins  30 y.o. male  PRE-OPERATIVE DIAGNOSIS:  right shoulder fracture dislocation  POST-OPERATIVE DIAGNOSIS:  right shoulder fracture dislocation  PROCEDURE:  Procedure(s): CLOSED REDUCTION SHOULDER (Right)  SURGEON:  Surgeon(s) and Role:    * Carole Civil, MD - Primary  PHYSICIAN ASSISTANT:   ASSISTANTS: none   ANESTHESIA:   general  EBL:     BLOOD ADMINISTERED:none  DRAINS: none   LOCAL MEDICATIONS USED:  NONE  SPECIMEN:  No Specimen  DISPOSITION OF SPECIMEN:  N/A  COUNTS:  No instruments were used  TOURNIQUET:  * No tourniquets in log *  DICTATION: .Dragon Dictation  PLAN OF CARE: Admit to inpatient   PATIENT DISPOSITION:  PACU - hemodynamically stable.   Delay start of Pharmacological VTE agent (>24hrs) due to surgical blood loss or risk of bleeding: not applicable  Procedure was done as follows The surgical site was marked in the preop area and the chart was reviewed surgical site was confirmed  Patient was taken to surgery  Placed on the operating table  Gen. anesthesia plus succinyl choline  After timeout shoulder was manipulated with traction countertraction and visible clunk noted.  We had the C-arm in place and he was taken through range of motion and he was found to be stable. The AP view is oblique we tried a Philip Aspen view it was better and showed reduction of the greater tuberosity fragment we took the scapular Y-view show shoulder reduction  Shoulder was stable with his arm at his side even in extremes of external rotation  Extubation taken this recovery room  The patient's glucose was 400+ so he will be admitted for hyperglycemia.

## 2015-12-04 NOTE — Anesthesia Preprocedure Evaluation (Addendum)
Anesthesia Evaluation  Patient identified by MRN, date of birth, ID band Patient awake    Reviewed: Allergy & Precautions, Patient's Chart, lab work & pertinent test results  Airway Mallampati: II  TM Distance: >3 FB     Dental  (+) Poor Dentition, Missing, Chipped,    Pulmonary sleep apnea and Continuous Positive Airway Pressure Ventilation , Current Smoker,     + decreased breath sounds      Cardiovascular hypertension, Pt. on medications  Rate:Tachycardia     Neuro/Psych  Headaches, Depression Bipolar Disorder    GI/Hepatic GERD  Medicated and Poorly Controlled,  Endo/Other  diabetes, Poorly Controlled, Type 2Morbid obesity  Renal/GU      Musculoskeletal   Abdominal (+) + obese,   Peds  Hematology   Anesthesia Other Findings   Reproductive/Obstetrics                           Anesthesia Physical Anesthesia Plan  ASA: III and emergent  Anesthesia Plan: General   Post-op Pain Management:    Induction: Intravenous, Rapid sequence and Cricoid pressure planned  Airway Management Planned: Oral ETT  Additional Equipment:   Intra-op Plan:   Post-operative Plan: Extubation in OR  Informed Consent: I have reviewed the patients History and Physical, chart, labs and discussed the procedure including the risks, benefits and alternatives for the proposed anesthesia with the patient or authorized representative who has indicated his/her understanding and acceptance.   Dental advisory given  Plan Discussed with: CRNA  Anesthesia Plan Comments:         Anesthesia Quick Evaluation

## 2015-12-05 ENCOUNTER — Encounter (HOSPITAL_COMMUNITY): Payer: Self-pay | Admitting: Orthopedic Surgery

## 2015-12-05 DIAGNOSIS — R739 Hyperglycemia, unspecified: Secondary | ICD-10-CM

## 2015-12-05 DIAGNOSIS — E119 Type 2 diabetes mellitus without complications: Secondary | ICD-10-CM | POA: Diagnosis not present

## 2015-12-05 DIAGNOSIS — S43304A Dislocation of unspecified parts of right shoulder girdle, initial encounter: Secondary | ICD-10-CM | POA: Diagnosis not present

## 2015-12-05 LAB — GLUCOSE, CAPILLARY
GLUCOSE-CAPILLARY: 176 mg/dL — AB (ref 65–99)
Glucose-Capillary: 187 mg/dL — ABNORMAL HIGH (ref 65–99)

## 2015-12-05 MED ORDER — HYDROCODONE-ACETAMINOPHEN 5-325 MG PO TABS: 1.0000 | ORAL_TABLET | ORAL | Status: DC | PRN

## 2015-12-05 NOTE — Care Management Obs Status (Signed)
[place] NOTIFICATION   Patient Details  Name: Kyle Collins MRN: [id number] Date of Birth: 1985-08-25   Medicare Observation Status Notification Given:  Yes    Alvie Heidelberg, RN 12/05/2015, 11:34 AM

## 2015-12-05 NOTE — Discharge Planning (Addendum)
Dr. Sarajane Jews rounded and informed pt is already pt of Dr. Dorris Fetch.  Feels is able to be Discharged and FU w/ Dr. Dorris Fetch as needed.  Dr. Aline Brochure paged [phone]  to inform and request DC orders.

## 2015-12-05 NOTE — Discharge Planning (Signed)
Contacted Ortho and found that Dr. In surgery till approx. 1500 today.  Will inform pt of new found. Probably will not be able to get DC orders till Dr. Is out of surgery.  Pt extremely frustrated at situation.

## 2015-12-05 NOTE — Discharge Summary (Signed)
Physician Discharge Summary  Patient ID: Kyle Collins MRN: [id number] DOB/AGE: 30/25/1987 30 y.o.  Admit date: 12/04/2015 Discharge date: 12/05/2015  Admission Diagnoses: Irreducible fracture dislocation right shoulder greater tuberosity fracture  Discharge Diagnoses: Same in addition Active Problems:   Hyperglycemia   Fracture dislocation of right shoulder joint   Dislocation, shoulder closed   Fracture, humerus, greater tuberosity   Discharged Condition: good  Hospital Course: The patient came to the ER with a dislocation and a fracture of his right shoulder he had a close reduction unsuccessful. I was called as a consult I admitted the patient after taken to the operating room for closed reduction under general anesthesia and C-arm guidance.  He also had hyperglycemia and had to be admitted for that as well.  Had a consult with medical service. They recommended follow-up with his endocrinologist.   Discharge Exam: Blood pressure 147/87, pulse 82, temperature 97.8 F (36.6 C), temperature source Oral, resp. rate 16, height 6\' 1"  (1.854 m), weight 350 lb (158.759 kg), SpO2 97 %.   Right arm contour looks normal neurovascular exam is intact feeling in his hand is normal color capillary refill normal he can move his hand in extension flexion all digits are moving normally   Disposition: 01-Home or Self Care  Discharge Instructions    Diet - low sodium heart healthy    Complete by:  As directed      Discharge instructions    Complete by:  As directed   Keep shoulder immobilizer on  Keep arm at side do not raise the arm above head or externally rotate the arm away from her body     Increase activity slowly    Complete by:  As directed             Medication List    TAKE these medications        escitalopram 20 MG tablet  Commonly known as:  LEXAPRO  Take 20 mg by mouth daily.     hydrochlorothiazide 25 MG tablet  Commonly known as:  HYDRODIURIL  Take 1 tablet (25  mg total) by mouth daily.     HYDROcodone-acetaminophen 5-325 MG tablet  Commonly known as:  NORCO/VICODIN  Take 1 tablet by mouth every 4 (four) hours as needed for moderate pain.     INVOKANA 100 MG Tabs tablet  Generic drug:  canagliflozin  TAKE ONE TABLET BY MOUTH DAILY BEFORE BREAKFAST.     lisinopril 40 MG tablet  Commonly known as:  PRINIVIL,ZESTRIL  Take 1 tablet (40 mg total) by mouth daily.     metFORMIN 500 MG 24 hr tablet  Commonly known as:  GLUCOPHAGE-XR  Take 1,000 mg by mouth daily after breakfast.     traZODone 150 MG tablet  Commonly known as:  DESYREL  Take 75-150 mg by mouth at bedtime as needed for sleep.           Follow-up Information    Follow up with Arther Abbott, MD In 1 week.   Specialties:  Orthopedic Surgery, Radiology   Why:  Preop at office visit for open treatment internal fixation greater tuberosity/rotator cuff repair   Contact information:   [address] [place] [address] [phone]       Signed: Arther Abbott 12/05/2015, 11:44 AM

## 2015-12-05 NOTE — Discharge Planning (Signed)
Pt informed that Dr. Has been informed of situation.  However, there's no clear timeframe of when they will be abl eto round.  Pt asked what the repercussions might be if he left AMA.  Pt informed that it possibly could put his health/saftey at risk but also insurance may not assist with the bill (since leaving without Dr. Consent).  Pt voiced understanding and states he will have to stay since cannot afford that to happen.  Yet, still needs to leave as soon as possible.

## 2015-12-05 NOTE — Discharge Planning (Signed)
Pt updated on situation of DC.

## 2015-12-05 NOTE — Discharge Planning (Signed)
Pt insisting on DC by 9AM or leaving AMA.  RN text to inform Hosp (just assigned this AM).  Pt informed that RN would do whatever he could, but Dr. Hazel Sams to release him. Pt states he has family situation that requires him to leave. Pt asked to be patient.

## 2015-12-05 NOTE — Addendum Note (Signed)
Addendum  created 12/05/15 1038 by Charmaine Downs, CRNA   Modules edited: Clinical Notes   Clinical Notes:  File: [id number]

## 2015-12-05 NOTE — Care Management Note (Addendum)
Case Management Note  Patient Details  Name: Kyle Collins MRN: [id number] Date of Birth: [birth year]  Subjective/Objective: Spoke with patient for discharge planning. Alert and oriented here for dislocated shoulder.  Patient has medicare and medicaid. Denies and issues and stated that he is ready to go home. Followed by Dr Dorris Fetch for diabetes control although A1c suggests poor control. Follow up scheduled per MD notes. No Cm needs identified.                     Action/Plan: Home with self care.   Expected Discharge Date:  12/05/15               Expected Discharge Plan:  Home/Self Care  In-House Referral:     Discharge planning Services  CM Consult  Post Acute Care Choice:    Choice offered to:     DME Arranged:    DME Agency:     HH Arranged:    HH Agency:     Status of Service:  Completed, signed off  Medicare Important Message Given:    Date Medicare IM Given:    Medicare IM give by:    Date Additional Medicare IM Given:    Additional Medicare Important Message give by:     If discussed at [place] of Stay Meetings, dates discussed:    Additional Comments:  Alvie Heidelberg, RN 12/05/2015, 11:35 AM

## 2015-12-05 NOTE — Discharge Planning (Addendum)
Pt IV removed and DC papers, given, explained and educated.  Pt informed of suggested FU appts . Pt also given pain script.  RN assessment and VS revealed stability for DC to home.  Pt walked to ED entrance where Taxi was met to transport home. Pt BS not checked or insulin given since pt refused - Not eating right now and states he will check at home.

## 2015-12-05 NOTE — Anesthesia Postprocedure Evaluation (Signed)
Anesthesia Post Note  Patient: Kyle Collins  Procedure(s) Performed: Procedure(s) (LRB): CLOSED REDUCTION SHOULDER (Right)  Patient location during evaluation: Nursing Unit Anesthesia Type: General Level of consciousness: awake and alert, oriented and patient cooperative Pain management: pain level controlled Vital Signs Assessment: post-procedure vital signs reviewed and stable Respiratory status: spontaneous breathing, nonlabored ventilation and respiratory function stable Cardiovascular status: blood pressure returned to baseline Postop Assessment: adequate PO intake and no signs of nausea or vomiting Anesthetic complications: no    Last Vitals:  Filed Vitals:   12/05/15 0500 12/05/15 0836  BP: 153/91 147/87  Pulse: 82   Temp: 36.6 C   Resp: 16     Last Pain:  Filed Vitals:   12/05/15 0844  PainSc: 5                  Amitai Delaughter J

## 2015-12-05 NOTE — Progress Notes (Signed)
   Subjective:    Patient ID: Kyle Collins, male    DOB: Oct 31, 1985, 30 y.o.   MRN: [id number]  Arm Pain   The patient is a stable condition his numbness is dissipated and his hand he says his shoulder feels great  He knows Dr. needed he seen him before he has an appointment with him in a month    Review of Systems polyp in his nose and for skin problems are noted     Objective:   Physical Exam  Normal range of motion in his hand normal sensation color capillary refill in his hand  Shoulder contour is normal      Assessment & Plan:  Status post reduction of fracture dislocation right shoulder patient okay for discharge

## 2015-12-05 NOTE — Consult Note (Addendum)
[facility] Medical Consultation  Kyle Collins [id number] DOB: 1985-12-25 DOA: 12/04/2015 PCP: Deloria Lair, MD   Requesting physician: Dr. Aline Brochure Reason for consultation: high blood sugar Date of consultation: 12/05/2015  Chief Complaint: Arm Pain  HPI:  30 yom with a hx of sleep apnea, bipolar disorder, DM, and HTN, fell while getting out bed and sustained a fx dislocation of the shoulder. While in the ER, closed reduction with 160 mg of propofol was unsuccessful. He subsequently underwent closed reduction of the right shoulder in the operating room and was observed overnight. We were consulted today for hyperglycemia and further recommendations.  He does not check his sugars, but reports that he manages it subjectively. He reports frequent urination, especially with fluid pills. He reports no change in appetite. Additionally, he reports minimal daily exercise. He has HTN, and reports that BP has been doing well. He has sleep apnea and has a mask.  He sees Dr. Dorris Fetch of endocrinology, who started him on Invokana. He has followup scheduled with him in next 1-2 months already. He reports Dr. Dorris Fetch has started discussion of insulin and that the he will consider it in near future.  Blood sugars over the last 12 hours have ranged 176-219. Complete metabolic panel was unremarkable on admission with normal anion gap. Glucose was noted to be 429. Modest leukocytosis seen on admission likely related to stress reaction. Of note hemoglobin A1c 10/09/2015 was 10.3 indicating poor control, also noted hemoglobin A1c 06/07/2015 was 11.4. Patient has been receiving sliding scale insulin.  Review of Systems:  Reports slight sore throat. Sinuses are sore. Right shoulder pain. Reports headache and neck pain. Reports dysuria. No vision problems. Denies any fever. Denies rash. Denies CP or trouble breathing. Denies N/V.   Past Medical History  Diagnosis Date  . Hypertension   . Diabetes mellitus    . Thyroid disease   . Sleep apnea   . Depression   . Bipolar disorder ([place])   . GERD (gastroesophageal reflux disease)   . Headache    Past Surgical History  Procedure Laterality Date  . Hernia repair    . Tooth extraction  spring 2016    top left    Allergies  Allergen Reactions  . Methylphenidate Derivatives Other (See Comments)    Patient states it made him "act like a zombie" when given as a child  . Ritalin [Methylphenidate Hcl]    Social History:  reports that he has been smoking E-cigarettes.  He does not have any smokeless tobacco history on file. He reports that he does not drink alcohol or use illicit drugs. Family History  Problem Relation Age of Onset  . Diabetes Mother   . Diabetes Father     Prior to Admission medications   Medication Sig Start Date End Date Taking? Authorizing Provider  escitalopram (LEXAPRO) 20 MG tablet Take 20 mg by mouth daily.   Yes Historical Provider, MD  hydrochlorothiazide (HYDRODIURIL) 25 MG tablet Take 1 tablet (25 mg total) by mouth daily. 10/26/15  Yes Cassandria Anger, MD  INVOKANA 100 MG TABS tablet TAKE ONE TABLET BY MOUTH DAILY BEFORE BREAKFAST. 10/29/15  Yes Cassandria Anger, MD  lisinopril (PRINIVIL,ZESTRIL) 40 MG tablet Take 1 tablet (40 mg total) by mouth daily. 06/26/14  Yes Milton Ferguson, MD  metFORMIN (GLUCOPHAGE-XR) 500 MG 24 hr tablet Take 1,000 mg by mouth daily after breakfast.   Yes Historical Provider, MD  traZODone (DESYREL) 150 MG tablet Take 75-150 mg by mouth at  bedtime as needed for sleep.    Yes Historical Provider, MD    Physical Exam: Filed Vitals:   12/04/15 1044 12/04/15 1506 12/04/15 2127 12/05/15 0500  BP: 145/84 164/109 137/85 153/91  Pulse: 93 94 95 82  Temp: 98.9 F (37.2 C) 99.1 F (37.3 C) 98.6 F (37 C) 97.8 F (36.6 C)  TempSrc:   Oral Oral  Resp: 18 20 20 16   Height:      Weight:      SpO2: 95% 97% 96% 97%    Constitutional:  . Appears calm and comfortable Eyes:  . PERRL  and irises appear normal . Normal conjunctivae and lids ENMT:  . external ears, nose appear normal Neck:  . neck appears normal, no masses, normal ROM Respiratory:  . CTA bilaterally, no w/r/r.  . Respiratory effort normal. No retractions or accessory muscle use Cardiovascular:  . RRR, no m/r/g . No LE extremity edema   Psychiatric:  . judgement and insight appear normal . Mental status o Orientation to person, place, time  o Mood, affect appropriate  Data reviewed:  I have personally reviewed following labs and imaging studies Labs on Admission:  CBC:  Recent Labs Lab 12/04/15 0649  WBC 13.3*  NEUTROABS 10.2*  HGB 16.0  HCT 46.1  MCV 89.2  PLT 123456    Basic Metabolic Panel:  Recent Labs Lab 12/04/15 0649  NA 138  K 4.3  CL 100*  CO2 26  GLUCOSE 429*  BUN 15  CREATININE 0.80  CALCIUM 9.3   Liver Function Tests:  Recent Labs Lab 12/04/15 0649  AST 44*  ALT 52  ALKPHOS 69  BILITOT 1.0  PROT 7.7  ALBUMIN 4.2   CBG:  Recent Labs Lab 12/04/15 1646 12/04/15 2044 12/04/15 2345 12/05/15 0406 12/05/15 0748  GLUCAP 296* 307* 219* 187* 176*   Urinalysis    Component Value Date/Time   COLORURINE YELLOW 02/18/2011 0020   APPEARANCEUR CLEAR 02/18/2011 0020   LABSPEC 1.025 02/18/2011 0020   PHURINE 6.0 02/18/2011 0020   GLUCOSEU NEGATIVE 02/18/2011 0020   HGBUR NEGATIVE 02/18/2011 0020   BILIRUBINUR NEGATIVE 02/18/2011 0020   KETONESUR NEGATIVE 02/18/2011 0020   PROTEINUR NEGATIVE 02/18/2011 0020   UROBILINOGEN 0.2 02/18/2011 0020   NITRITE NEGATIVE 02/18/2011 0020   LEUKOCYTESUR NEGATIVE 02/18/2011 0020     Sepsis Labs Invalid input(s): PROCALCITONIN,  WBC,  LACTICIDVEN Microbiology Recent Results (from the past 240 hour(s))  Surgical pcr screen     Status: Abnormal   Collection Time: 12/04/15  7:30 AM  Result Value Ref Range Status   MRSA, PCR POSITIVE (A) NEGATIVE Final    Comment: RESULT CALLED TO, READ BACK BY AND VERIFIED  WITH: MORRIS,C. AT 1031 ON 12/04/2015 BY BAUGHAM,M.    Staphylococcus aureus POSITIVE (A) NEGATIVE Final    Comment:        The Xpert SA Assay (FDA approved for NASAL specimens in patients over 30 years of age), is one component of a comprehensive surveillance program.  Test performance has been validated by [facility] for patients greater than or equal to 57 year old. It is not intended to diagnose infection nor to guide or monitor treatment. RESULT CALLED TO, READ BACK BY AND VERIFIED WITH: MORRIS,C. AT 1031 ON 12/04/2015 BY BAUGHAM,M.     Inpatient Medications:   Scheduled Meds: . canagliflozin  100 mg Oral QAC breakfast  . Chlorhexidine Gluconate Cloth  6 each Topical Q0600  . docusate sodium  100 mg Oral BID  .  escitalopram  20 mg Oral Daily  . hydrochlorothiazide  25 mg Oral Daily  . insulin aspart  0-24 Units Subcutaneous Q4H  . lisinopril  40 mg Oral Daily  . metFORMIN  1,000 mg Oral QPC breakfast  . mupirocin ointment  1 application Nasal BID  . sodium chloride flush  3 mL Intravenous Q12H   Continuous Infusions:   Imaging on Admission: Dg Shoulder Right  12/04/2015  CLINICAL DATA:  Close reduction right shoulder dislocation. EXAM: RIGHT SHOULDER - 2+ VIEW COMPARISON:  None. FINDINGS: Three intraoperative spot images demonstrate interval reduction of the dislocated right shoulder. Lucency noted at the greater tuberosity concerning for fracture. IMPRESSION: Interval reduction of the dislocated right shoulder. Appearance concerning for fracture at the greater tuberosity. Electronically Signed   By: Rolm Baptise M.D.   On: 12/04/2015 08:26   Dg Shoulder Right  12/04/2015  CLINICAL DATA:  [place] home. EXAM: RIGHT SHOULDER - 2+ VIEW COMPARISON:  None. FINDINGS: There is an anterior subcoracoid glenohumeral dislocation. There is a comminuted fracture of the greater tuberosity. Glenoid appears grossly intact. IMPRESSION: Fracture dislocation of the glenohumeral joint.  Electronically Signed   By: Andreas Newport M.D.   On: 12/04/2015 02:37   Dg Shoulder Right Port  12/04/2015  CLINICAL DATA:  Right shoulder dislocation EXAM: PORTABLE RIGHT SHOULDER - 2+ VIEW COMPARISON:  12/04/2015 at 02:19 FINDINGS: Two views of the right shoulder obtained portably demonstrate persistent anterior subcoracoid dislocation. Greater tuberosity fracture fragments are again evident. IMPRESSION: Persistent right shoulder dislocation. Electronically Signed   By: Andreas Newport M.D.   On: 12/04/2015 04:41   Dg Humerus Right  12/04/2015  CLINICAL DATA:  Ground level fall tonight. EXAM: RIGHT HUMERUS - 2+ VIEW COMPARISON:  None. FINDINGS: There is a fracture dislocation of the right shoulder. The dislocation is anterior subcoracoid. The fracture fragment appears to the include most of the greater tuberosity. Glenoid is grossly intact. AC joint is grossly intact. Remainder of the humerus is grossly intact. Study limited due to positioning challenges and immobility of the shoulder. IMPRESSION: Fracture dislocation at the glenohumeral joint. Electronically Signed   By: Andreas Newport M.D.   On: 12/04/2015 01:36   Dg C-arm 1-60 Min  12/04/2015  CLINICAL DATA:  CLOSE REDUCTION SHOULDER DISLOCATION EXAM: DG C-ARM 61-120 MIN ; RIGHT SHOULDER:  2+ VIEWS COMPARISON:  Study obtained earlier in the day. FLUOROSCOPY TIME:  0 minutes 56 seconds; 3 submitted images FINDINGS: Internal rotation, external rotation, and Y scapular views were obtained. The anterior dislocation has been reduced successfully. There is a comminuted fracture of the greater tuberosity in near anatomic alignment following dislocation reduction. No other fracture. No appreciable joint space narrowing. IMPRESSION: Comminuted fracture greater tuberosity of the proximal humerus. Overall alignment near anatomic. Successful reduction of previous anterior dislocation. Electronically Signed   By: Lowella Grip III M.D.   On: 12/04/2015  08:32    Active Problems:   Hyperglycemia   Fracture dislocation of right shoulder joint   Dislocation, shoulder closed   Fracture, humerus, greater tuberosity   Impression/Recommendations 1. DM type 2 uncontrolled with HgbA1c 10.28 September 2015. Currently stable as inpatient with SSI. Record demonstrates longstanding poor control, but no acute issues and no DKA. Patient on 2 oral medications, follows with Dr. Dorris Fetch, has appropriate outpatient followup already scheduled and Dr. Dorris Fetch has initiated discussion about possible future insulin. 2. HTN, stable. Continue HCTZ and lisinopril 3. OSA    Recommend no changes to meds currently. Continue metformin  and Invokana on discharge. Would probably benefit from insulin. I will send Dr. Dorris Fetch an electronic message to inform him of hospitalization and will defer initiation of insulin and any changes to timing of followup to him.  No other active medical issues.  Thank you for this consultation. I will sign off. Please contact me if I can be of any further assistance.  Murray Hodgkins, MD  [facility] Direct contact:  --Via amion app OR  --[email or web address]; password [email or web address] and click  [phone] contact night coverage as above  Time spent: 25 minutes  By signing my name below, I, Delene Ruffini, attest that this documentation has been prepared under the direction and in the presence of [place]. Sarajane Jews, MD. Electronically Signed: Delene Ruffini, Scribe.  12/05/2015 910am      I personally performed the services described in this documentation. All medical record entries made by the scribe were at my direction. I have reviewed the chart and agree that the record reflects my personal performance and is accurate and complete. Murray Hodgkins, MD

## 2015-12-13 ENCOUNTER — Telehealth: Payer: Self-pay

## 2015-12-13 ENCOUNTER — Ambulatory Visit (INDEPENDENT_AMBULATORY_CARE_PROVIDER_SITE_OTHER): Payer: Medicare Other | Admitting: Orthopedic Surgery

## 2015-12-13 VITALS — BP 158/108 | HR 84 | Resp 18 | Ht 73.0 in | Wt 350.0 lb

## 2015-12-13 DIAGNOSIS — S43004D Unspecified dislocation of right shoulder joint, subsequent encounter: Secondary | ICD-10-CM

## 2015-12-13 MED ORDER — BLOOD GLUCOSE MONITOR KIT: PACK | Status: DC

## 2015-12-13 NOTE — Progress Notes (Signed)
Chief Complaint  Patient presents with  . Follow-up    Hospital follow up fractured shoulder   FRX DISLOCATION RT SHOULDER. HAD CR IN OR AFTER ER FAILED TO REDUCE.  C/O DELTOID SKIN NUMBNESS PRE AND POST REDUCTION Still poor compliance with glu mngement  The arm looks good pulses are intact and sensation nml, xc lteral upper arm.  HE HAS TO SEE DR NEIDA, TO CLEAR FOR SURGERY

## 2015-12-13 NOTE — Telephone Encounter (Signed)
Pts social worker came by to ask about getting surgical clearance. Pt is not testing BG. He tells his social worker that its because insurance will not cover a glucometer. However he told us that he would not test. He last A1c was 10.3 on 10-09-15.  Charlotte Crumb states that the pt is now willing to test. How many times per day does he need to test for OV.

## 2015-12-13 NOTE — Patient Instructions (Signed)
WEAR SLING AS NEEDED START THERAPY SEE DR Carie Caddy

## 2015-12-13 NOTE — Telephone Encounter (Signed)
Advise him to test 4 times a day ( before meals and at bedtime ) for a week and bring meter and logs for office visit. He must be informed that he will likely require insulin treatment  ( multiple injections daily) for several weeks before his clearance for surgery.

## 2015-12-31 ENCOUNTER — Ambulatory Visit (INDEPENDENT_AMBULATORY_CARE_PROVIDER_SITE_OTHER): Payer: Medicare Other

## 2015-12-31 ENCOUNTER — Ambulatory Visit: Payer: Medicare Other | Admitting: Orthopedic Surgery

## 2015-12-31 VITALS — BP 148/87 | HR 108 | Ht 73.0 in | Wt 350.0 lb

## 2015-12-31 DIAGNOSIS — S4291XD Fracture of right shoulder girdle, part unspecified, subsequent encounter for fracture with routine healing: Secondary | ICD-10-CM | POA: Diagnosis not present

## 2015-12-31 NOTE — Progress Notes (Signed)
Follow-up in postop status post close reduction under anesthesia fracture dislocation right shoulder, K by uncontrolled glucose  The patient comes back for x-ray for x-ray shows superior and posterior fracture comminuted greater tuberosity with glenohumeral joint reduced  Patient will be seen by endocrinologist for preop

## 2015-12-31 NOTE — Patient Instructions (Signed)
appt with Dr Dorris Fetch 01/07/16 11:30am, schedule follow up with Dr Aline Brochure after Dr Dorris Fetch surgically clears

## 2016-01-07 ENCOUNTER — Encounter: Payer: Self-pay | Admitting: "Endocrinology

## 2016-01-07 ENCOUNTER — Ambulatory Visit (INDEPENDENT_AMBULATORY_CARE_PROVIDER_SITE_OTHER): Payer: Medicare Other | Admitting: "Endocrinology

## 2016-01-07 VITALS — BP 138/82 | HR 109 | Ht 73.0 in | Wt 347.0 lb

## 2016-01-07 DIAGNOSIS — R739 Hyperglycemia, unspecified: Secondary | ICD-10-CM

## 2016-01-07 DIAGNOSIS — Z9119 Patient's noncompliance with other medical treatment and regimen: Secondary | ICD-10-CM

## 2016-01-07 DIAGNOSIS — E1165 Type 2 diabetes mellitus with hyperglycemia: Secondary | ICD-10-CM | POA: Diagnosis not present

## 2016-01-07 DIAGNOSIS — Z91199 Patient's noncompliance with other medical treatment and regimen due to unspecified reason: Secondary | ICD-10-CM

## 2016-01-07 DIAGNOSIS — IMO0001 Reserved for inherently not codable concepts without codable children: Secondary | ICD-10-CM

## 2016-01-07 DIAGNOSIS — I10 Essential (primary) hypertension: Secondary | ICD-10-CM

## 2016-01-07 NOTE — Progress Notes (Signed)
Subjective:    Patient ID: Kyle Collins, male    DOB: [birth year], PCP Deloria Lair, MD   Past Medical History  Diagnosis Date  . Hypertension   . Diabetes mellitus   . Thyroid disease   . Sleep apnea   . Depression   . Bipolar disorder ([place])   . GERD (gastroesophageal reflux disease)   . Headache    Past Surgical History  Procedure Laterality Date  . Hernia repair    . Tooth extraction  spring 2016    top left   . Shoulder closed reduction Right 12/04/2015    Procedure: CLOSED REDUCTION SHOULDER;  Surgeon: Carole Civil, MD;  Location: [facility];  Service: Orthopedics;  Laterality: Right;   Social History   Social History  . Marital Status: Single    Spouse Name: N/A  . Number of Children: N/A  . Years of Education: N/A   Social History Main Topics  . Smoking status: Current Every Day Smoker    Types: E-cigarettes  . Smokeless tobacco: Not on file  . Alcohol Use: No  . Drug Use: No  . Sexual Activity: No   Other Topics Concern  . Not on file   Social History Narrative   Outpatient Encounter Prescriptions as of 01/07/2016  Medication Sig  . blood glucose meter kit and supplies KIT Dispense based on patient and insurance preference. Use up to four times daily as directed. (FOR ICD-10 E11.65)  . escitalopram (LEXAPRO) 20 MG tablet Take 20 mg by mouth daily.  . hydrochlorothiazide (HYDRODIURIL) 25 MG tablet Take 1 tablet (25 mg total) by mouth daily.  Marland Kitchen HYDROcodone-acetaminophen (NORCO/VICODIN) 5-325 MG tablet Take 1 tablet by mouth every 4 (four) hours as needed for moderate pain.  . INVOKANA 100 MG TABS tablet TAKE ONE TABLET BY MOUTH DAILY BEFORE BREAKFAST.  Marland Kitchen lisinopril (PRINIVIL,ZESTRIL) 40 MG tablet Take 1 tablet (40 mg total) by mouth daily.  . metFORMIN (GLUCOPHAGE-XR) 500 MG 24 hr tablet Take 1,000 mg by mouth daily after breakfast.  . traZODone (DESYREL) 150 MG tablet Take 75-150 mg by mouth at bedtime as needed for sleep.    No  facility-administered encounter medications on file as of 01/07/2016.   ALLERGIES: Allergies  Allergen Reactions  . Methylphenidate Derivatives Other (See Comments)    Patient states it made him "act like a zombie" when given as a child  . Ritalin [Methylphenidate Hcl]    VACCINATION STATUS:  There is no immunization history on file for this patient.  Diabetes He presents for his follow-up diabetic visit. He has type 2 diabetes mellitus. Onset time: Diagnosed at approximate age of 89 years. His disease course has been improving. There are no hypoglycemic associated symptoms. Pertinent negatives for hypoglycemia include no confusion, headaches, pallor or seizures. Associated symptoms include polydipsia, polyuria and visual change. Pertinent negatives for diabetes include no chest pain, no fatigue, no polyphagia and no weakness. There are no hypoglycemic complications. Symptoms are improving. There are no diabetic complications. Risk factors for coronary artery disease include diabetes mellitus, hypertension, male sex, obesity, sedentary lifestyle and tobacco exposure. Current diabetic treatment includes oral agent (monotherapy). He is compliant with treatment some of the time. His weight is decreasing steadily. He is following a generally unhealthy diet. When asked about meal planning, he reported none. He has not had a previous visit with a dietitian (He missed his appointment with the dietitian.). Home blood sugar record trend: Earlier documented hypoglycemia on his log and meter  are likely erroneous since he did not have any symptoms during those readings. His breakfast blood glucose range is generally 180-200 mg/dl. His lunch blood glucose range is generally 180-200 mg/dl. His dinner blood glucose range is generally 180-200 mg/dl. His overall blood glucose range is 180-200 mg/dl.  Hypertension This is a chronic problem. The current episode started more than 1 year ago. Pertinent negatives include  no chest pain, headaches, neck pain, palpitations or shortness of breath. Past treatments include ACE inhibitors.     Review of Systems  Constitutional: Negative for fever, chills, fatigue and unexpected weight change.  HENT: Negative for dental problem, mouth sores and trouble swallowing.   Eyes: Negative for visual disturbance.  Respiratory: Negative for cough, choking, chest tightness, shortness of breath and wheezing.   Cardiovascular: Negative for chest pain, palpitations and leg swelling.  Gastrointestinal: Negative for nausea, vomiting, abdominal pain, diarrhea, constipation and abdominal distention.  Endocrine: Positive for polydipsia and polyuria. Negative for polyphagia.  Genitourinary: Negative for dysuria, urgency, hematuria and flank pain.  Musculoskeletal: Negative for myalgias, back pain, gait problem and neck pain.  Skin: Negative for pallor, rash and wound.  Neurological: Negative for seizures, syncope, weakness, numbness and headaches.  Psychiatric/Behavioral: Negative.  Negative for confusion and dysphoric mood.    Objective:    BP 138/82 mmHg  Pulse 109  Ht _0  (1.854 m)  Wt 347 lb (157.398 kg)  BMI 45.79 kg/m2  SpO2 95%  Wt Readings from Last 3 Encounters:  01/07/16 347 lb (157.398 kg)  12/31/15 350 lb (158.759 kg)  12/13/15 350 lb (158.759 kg)    Physical Exam  Constitutional: He is oriented to person, place, and time. He appears well-developed and well-nourished. He is cooperative. No distress.  HENT:  Head: Normocephalic and atraumatic.  Eyes: EOM are normal.  Neck: Normal range of motion. Neck supple. No tracheal deviation present. No thyromegaly present.  Cardiovascular: Normal rate, S1 normal, S2 normal and normal heart sounds.  Exam reveals no gallop.   No murmur heard. Pulses:      Dorsalis pedis pulses are 1+ on the right side, and 1+ on the left side.       Posterior tibial pulses are 1+ on the right side, and 1+ on the left side.   Pulmonary/Chest: Breath sounds normal. No respiratory distress. He has no wheezes.  Abdominal: Soft. Bowel sounds are normal. He exhibits no distension. There is no tenderness. There is no guarding and no CVA tenderness.  Musculoskeletal: He exhibits no edema.       Right shoulder: He exhibits no swelling and no deformity.  Neurological: He is alert and oriented to person, place, and time. He has normal strength and normal reflexes. No cranial nerve deficit or sensory deficit. Gait normal.  Skin: Skin is warm and dry. No rash noted. No cyanosis. Nails show no clubbing.  Psychiatric: His speech is normal. Cognition and memory are normal.  Noncompliant, defensive and reluctant affect.     Complete Blood Count (Most recent): Lab Results  Component Value Date   WBC 13.3* 12/04/2015   HGB 16.0 12/04/2015   HCT 46.1 12/04/2015   MCV 89.2 12/04/2015   PLT 260 12/04/2015   Chemistry (most recent): Lab Results  Component Value Date   NA 138 12/04/2015   K 4.3 12/04/2015   CL 100* 12/04/2015   CO2 26 12/04/2015   BUN 15 12/04/2015   CREATININE 0.80 12/04/2015    Lab Results  Component Value Date  HGBA1C 10.3* 10/09/2015   HGBA1C 11.4* 06/07/2015     Assessment & Plan:   1. Uncontrolled type 2 diabetes mellitus without complication, without [place]-term current use of insulin ([place]   - His diabetes is  complicated by noncompliance and patient remains at a high risk for more acute and chronic complications of diabetes which include CAD, CVA, CKD, retinopathy, and neuropathy. These are all discussed in detail with the patient.  Patient came with Better glucose profile with averaging 191 over the last 14 days. This is largely due to his engagement and dietary modification and continued on Invokana and metformin.   -His last visit A1c was 10.3% improving from 11.4%.   Recent labs reviewed.   - I have re-counseled the patient on diet management and weight loss  by adopting a  carbohydrate restricted / protein rich  Diet.  - Suggestion is made for patient to avoid simple carbohydrates   from their diet including Cakes , Desserts, Ice Cream,  Soda (  diet and regular) , Sweet Tea , Candies,  Chips, Cookies, Artificial Sweeteners,   and "Sugar-free" Products .  This will help patient to have stable blood glucose profile and potentially avoid unintended  Weight gain.  - Patient is advised to stick to a routine mealtimes to eat 3 meals  a day and avoid unnecessary snacks ( to snack only to correct hypoglycemia).  - The patient  has been  scheduled with Jearld Fenton, RDN, CDE for individualized DM education. He did not show up for appointment, will reschedule.  - I have approached patient with the following individualized plan to manage diabetes and patient agrees.  - Based on his significantly  improved glycemic profile, and if he continues to be engaged, he would not require insulin treatment for now. -And he is cleared for his planned surgery for right shoulder. -I have emphasized to him that continued control of glycemia is important for optimal outcome of his surgery, specifically to decrease the risk of wound infection.  -I will continue MTF 544m  ER po BID, and continue Invokana 1034mpo qday.  - Patient specific target  for A1c; LDL, HDL, Triglycerides, and  Waist Circumference were discussed in detail.  2) BP/HTN: Uncontrolled. Continue current medications including ACEI/ARB. I added HCTZ 2539mo qday.  3)  Weight/Diet:  He did reluctantly accepted CDE consult, , unfortunately he did not keep appointment. he is not motivated to exercise. - carbohydrates information provided. 4. Personal history of noncompliance with medical treatment, presenting hazards to health -I tried to counsel this patient however he is frankly unconcerned and does not want to take any responsibility.  5) Chronic Care/Health Maintenance:  -Patient  on ACEI/ARB  and encouraged to  continue to follow up with Ophthalmology, Podiatrist at least yearly or according to recommendations, and advised to  quit smoking. I have recommended yearly flu vaccine and pneumonia vaccination at least every 5 years; moderate intensity exercise for up to 150 minutes weekly; and  sleep for at least 7 hours a day. -He is cleared from diabetes point of view for the planned shoulder surgery.  - 25 minutes of time was spent on the care of this patient , 50% of which was applied for counseling on diabetes complications and their preventions.  - I advised patient to maintain close follow up with TAPPER,DAVID B, MD for primary care needs.  Patient is asked to bring meter and  blood glucose logs during their next visit.   Follow  up plan: -Return in about 9 weeks (around 03/10/2016) for diabetes, high blood pressure, high cholesterol, follow up with pre-visit labs, meter, and logs.  Glade Lloyd, MD Phone: [phone]  Fax: [phone]   01/07/2016, 1:35 PM

## 2016-01-07 NOTE — Patient Instructions (Signed)

## 2016-01-09 ENCOUNTER — Telehealth: Payer: Self-pay | Admitting: Orthopedic Surgery

## 2016-01-09 NOTE — Telephone Encounter (Signed)
Patient called to relay that he has been seen by Dr Dorris Fetch regarding clearance for surgery.  States Dr Dorris Fetch was to "message our office."   Please advise as to whether we need to send a letter of request for clearance, or if we have been contacted directly already.  Patient also requests that we provide any appointment or surgery schedule information to his caseworker, Donneta Romberg, ph [phone]. Advised patient we will send him a designated party contact form to complete and sign for scanning into his file.  He said he did not understand why this is needed; I re-explained and sent a copy of form for this to be done, as per protocol.  His ph# (new) [phone]

## 2016-01-10 NOTE — Telephone Encounter (Signed)
NO I WILL FIND A DATE WHEN I GET TO OFFICE

## 2016-01-10 NOTE — Telephone Encounter (Signed)
Will patient need to come in for pre op visit? Please see below for clearance per Dr Liliane Channel last note...  5) Chronic Care/Health Maintenance:  -Patient on ACEI/ARB and encouraged to continue to follow up with Ophthalmology, Podiatrist at least yearly or according to recommendations, and advised to quit smoking. I have recommended yearly flu vaccine and pneumonia vaccination at least every 5 years; moderate intensity exercise for up to 150 minutes weekly; and sleep for at least 7 hours a day. -He is cleared from diabetes point of view for the planned shoulder surgery.  - 25 minutes of time was spent on the care of this patient , 50% of which was applied for counseling on diabetes complications and their preventions.  - I advised patient to maintain close follow up with Collins,Kyle B, MD for primary care needs. Patient is asked to bring meter and blood glucose logs during their next visit.   Follow up plan: -Return in about 9 weeks (around 03/10/2016) for diabetes, high blood pressure, high cholesterol, follow up with pre-visit labs, meter, and logs.  Kyle Lloyd, MD Phone: [phone] Fax: [phone]   01/07/2016, 1:35 PM

## 2016-01-17 NOTE — Telephone Encounter (Signed)
Still waiting on date

## 2016-01-18 NOTE — Telephone Encounter (Signed)
July 20th  orif rt shoulder   Special needs set up like rotator cuff repair

## 2016-01-22 ENCOUNTER — Ambulatory Visit: Payer: Medicare Other | Admitting: Orthopaedic Surgery

## 2016-01-24 NOTE — Telephone Encounter (Signed)
Patients case worker came into office and was advised

## 2016-02-01 ENCOUNTER — Other Ambulatory Visit: Payer: Self-pay

## 2016-02-04 ENCOUNTER — Other Ambulatory Visit: Payer: Self-pay | Admitting: *Deleted

## 2016-02-04 ENCOUNTER — Ambulatory Visit (HOSPITAL_COMMUNITY): Payer: Medicare Other | Attending: Orthopedic Surgery | Admitting: Specialist

## 2016-02-04 DIAGNOSIS — M25611 Stiffness of right shoulder, not elsewhere classified: Secondary | ICD-10-CM | POA: Diagnosis not present

## 2016-02-04 DIAGNOSIS — M25511 Pain in right shoulder: Secondary | ICD-10-CM | POA: Diagnosis not present

## 2016-02-04 DIAGNOSIS — R29898 Other symptoms and signs involving the musculoskeletal system: Secondary | ICD-10-CM | POA: Diagnosis not present

## 2016-02-04 NOTE — Therapy (Signed)
[facility] [place], [place], [address] Phone: [phone]   Fax:  [phone]  Occupational Therapy Evaluation  Patient Details  Name: KYIAN TEIGEN MRN: [id number] Date of Birth: 1986-03-10 Referring Provider: Aline Brochure   Encounter Date: 02/04/2016      OT End of Session - 02/04/16 1025    Visit Number 1   Number of Visits 8   Date for OT Re-Evaluation 03/05/16   Authorization Type Medicare and Medicaid   Authorization Time Period before 10th visit   Authorization - Visit Number 1   Authorization - Number of Visits 10   OT Start Time 0910   OT Stop Time 0950   OT Time Calculation (min) 40 min   Activity Tolerance Patient tolerated treatment well   Behavior During Therapy [facility] for tasks assessed/performed      Past Medical History  Diagnosis Date  . Hypertension   . Diabetes mellitus   . Thyroid disease   . Sleep apnea   . Depression   . Bipolar disorder ([place])   . GERD (gastroesophageal reflux disease)   . Headache     Past Surgical History  Procedure Laterality Date  . Hernia repair    . Tooth extraction  spring 2016    top left   . Shoulder closed reduction Right 12/04/2015    Procedure: CLOSED REDUCTION SHOULDER;  Surgeon: Carole Civil, MD;  Location: [facility];  Service: Orthopedics;  Laterality: Right;    There were no vitals filed for this visit.      Subjective Assessment - 02/04/16 0951    Subjective  S:  I really cant use my arm above waist height.   Pertinent History Mr. Wiltgen states in May he fell out of bed, landing on his right shoulder.  He presented to the ED and was diagnosed with a right greater tuberosity fracture and right shoulder dislocation.  He was placed under anesthesia to reduce the dislocation.  He is scheduled for ORIF of his right shoulder later this month and has been referred to occupational therapy for evaluation and treatment.     Special Tests FOTO 44/100   Patient  Stated Goals I want to make progress towards my arm working correctly.   Currently in Pain? Yes   Pain Score 9    Pain Location Shoulder   Pain Orientation Right   Pain Descriptors / Indicators Sharp   Pain Radiating Towards hand   Pain Onset More than a month ago   Pain Frequency Intermittent   Aggravating Factors  positioning causes sharp pain in bicep region of shoulder and upper arm, with repositioning patient's pain will subside.   Pain Relieving Factors repositioning   Effect of Pain on Daily Activities unable to use dominant arm with daily tasks           [facility] OT Assessment - 02/04/16 0001    Assessment   Diagnosis S/P Right Shoulder Dislocation and right greater tuberosity fracture   Referring Provider Aline Brochure    Onset Date 12/04/15   Prior Therapy n/a   Precautions   Precautions Shoulder   Type of Shoulder Precautions no heavy lifting, no abduction combined with external or internal rotation    Restrictions   Weight Bearing Restrictions No   Balance Screen   Has the patient fallen in the past 6 months Yes   How many times? 1   Has the patient had a decrease in activity level because of a  fear of falling?  No   Is the patient reluctant to leave their home because of a fear of falling?  No   Home  Environment   Family/patient expects to be discharged to: Private residence   Lives With Alone   Prior Function   Level of Independence Independent   Vocation On disability   Leisure gaming   ADL   ADL comments Patient lives alone.  He is right hand dominant and is unable to use his right arm above waist height for any functional activities.  He has difficulty getting comfortable to sleep at night, as well.    Written Expression   Dominant Hand Right   Cognition   Overall Cognitive Status Within Functional Limits for tasks assessed   Observation/Other Assessments   Focus on Therapeutic Outcomes (FOTO)  44/100   Sensation   Light Touch Appears Intact   Coordination    Gross Motor Movements are Fluid and Coordinated Yes   Fine Motor Movements are Fluid and Coordinated Yes   ROM / Strength   AROM / PROM / Strength AROM;PROM;Strength   Palpation   Palpation comment moderate fascial restrictions noted in right shoulder region   AROM   Overall AROM Comments assessed in supine, external rotation and internal rotation with shoulder adducted    AROM Assessment Site Shoulder   Right/Left Shoulder Right   Right Shoulder Flexion 100 Degrees   Right Shoulder ABduction 50 Degrees   Right Shoulder Internal Rotation 90 Degrees   Right Shoulder External Rotation 0 Degrees   PROM   Overall PROM Comments assessed in supine, External and internal rotation with shoulder adducted    PROM Assessment Site Shoulder   Right/Left Shoulder Right   Right Shoulder Flexion 165 Degrees   Right Shoulder ABduction 60 Degrees   Right Shoulder Internal Rotation 90 Degrees   Right Shoulder External Rotation 0 Degrees   Strength   Overall Strength Comments not assessed due to increased pain and decreased active mobility   Strength Assessment Site Shoulder   Right/Left Shoulder Right                         OT Education - 02/04/16 1024    Education provided Yes   Education Details shoulder table slides:  flexion, abduction, external/internal rotation, shoulder elevation, extension, retraction   Person(s) Educated Patient   Methods Explanation;Demonstration;Handout   Comprehension Verbalized understanding;Returned demonstration          OT Short Term Goals - 02/04/16 1130    OT SHORT TERM GOAL #1   Title Patient will be educated and independent with a HEP for improving functional use of his right arm.   Time 2   Period Weeks   Status New   OT SHORT TERM GOAL #2   Title Patient will improve right shoulder P/ROM by 50% for increased functional use of right arm with daily tasks.   Time 2   Period Weeks   Status New   OT SHORT TERM GOAL #3   Title  Patient will decrease intermittent pain to 7/10 in his right arm.   Time 2   Period Weeks   Status New           OT [place] Term Goals - 02/04/16 1131    OT [place] TERM GOAL #1   Title Patient will improve right shoulder A/ROM by 25% for improved ability to complete functional activities at shoulder level.   Time 4  Period Weeks   Status New   OT [place] TERM GOAL #2   Title Patient will have 3+/5 strength in his right shoulder for improved ability to lift items onto his counter top.   Time 4   Period Weeks   Status New   OT [place] TERM GOAL #3   Title Patient will decrease fascial restrictions to min-moderate in his right shoulder region for greater mobility needed for daily tasks.   Time 4   Period Weeks   Status New   OT [place] TERM GOAL #4   Title Patient will decrease pain level to 5/10 or better in his right shoulder region during functional activities.                Plan - 02/04/16 1026    Clinical Impression Statement A:  Patient is a 31 year old male with past medical history that includes depression, bipolar disorder, diabetes, hypertension, and sleep apnea.  He fell out of bed on 12/04/15 dislocating his right shoulder and fracturing the greater tuberosity of his right humerus.  The dislocation was reduced under anesthesia and he has been scheduled for a ORIF of his right humerus later this month.  He lives alone and does not have assistance with his B/IADLs.  He is not able to use his dominant right arm above waist height and he is not able to get comfortable at night when going to sleep.     Rehab Potential Good   OT Frequency 2x / week   OT Duration 4 weeks   OT Treatment/Interventions Self-care/ADL training;Cryotherapy;Electrical Stimulation;Moist Heat;Ultrasound;Iontophoresis;Therapeutic exercise;Neuromuscular education;Energy conservation;DME and/or AE instruction;Passive range of motion;Manual Therapy;Therapeutic exercises;Therapeutic activities;Patient/family  education   Plan P:  Skilled OT intervention to improve use of RUE with functional activities while decreasing pain and fascial restrictions and improving mobility and strength in his dominant right arm.  Next visit:  review HEP, begin manual therapy and P/ROM of right shoulder, ball exercises, A/ROM elevation, extension, retraction.     Consulted and Agree with Plan of Care Patient      Patient will benefit from skilled therapeutic intervention in order to improve the following deficits and impairments:  Decreased range of motion, Decreased strength, Increased muscle spasms, Increased fascial restricitons, Impaired UE functional use, Pain  Visit Diagnosis: Pain in right shoulder - Plan: Ot plan of care cert/re-cert  Stiffness of right shoulder, not elsewhere classified - Plan: Ot plan of care cert/re-cert  Other symptoms and signs involving the musculoskeletal system - Plan: Ot plan of care cert/re-cert      G-Codes - [id number]    Functional Assessment Tool Used FOTO 44/100, 56% impaired    Functional Limitation Carrying, moving and handling objects   Carrying, Moving and Handling Objects Current Status [id number]) At least 40 percent but less than 60 percent impaired, limited or restricted   Carrying, Moving and Handling Objects Goal Status [id number]) At least 20 percent but less than 40 percent impaired, limited or restricted      Problem List Patient Active Problem List   Diagnosis Date Noted  . Hyperglycemia 12/04/2015  . Fracture dislocation of right shoulder joint 12/04/2015  . Dislocation, shoulder closed   . Fracture, humerus, greater tuberosity   . Morbid obesity due to excess calories ([place]) 10/26/2015  . Uncontrolled type 2 diabetes mellitus without complication, without [place]-term current use of insulin ([place]) 07/16/2015  . Essential hypertension, benign 07/16/2015  . Personal history of noncompliance with medical treatment, presenting hazards to health  07/16/2015    Vangie Bicker, [place], OTR/L [phone]  02/04/2016, 11:40 AM  [place], [place], [address] Phone: [phone]   Fax:  [phone]  Name: KALEEM LORES MRN: [id number] Date of Birth: 1986/07/13

## 2016-02-04 NOTE — Patient Instructions (Signed)
TOWEL SLIDES COMPLETE FOR 1-3 MINUTES, 3-5 TIMES PER DAY  SHOULDER: Flexion On Table   Place hands on table, elbows straight. Slide arms across table, bending at your waist. Press hands down into table. Complete for 1-3 minutes, 2-3 times per day. Abduction (Passive)   With arm out to side with palm down, resting on table, slide arm across table.  Complete 1-3 minutes, 2-3 times per day.   Copyright  VHI. All rights reserved.     Internal Rotation (Assistive)   Seated with elbow bent at right angle and held against side, slide arm on table surface in an inward arc. Complete 1-3 minutes 2-3 times per day.  Activity: Use this motion to brush crumbs off the table.  Copyright  VHI. All rights reserved.    Elevation (Active)    Shrug shoulders up, breathing in. Relax, breathing out. Repeat __10__ times. Do __3__ sessions per day.  Copyright  VHI. All rights reserved.  Extension (Active)    Bring arm back as far as possible. For a greater challenge, do this while lying down on stomach. Repeat __10__ times. Do __3__ sessions per day.  Copyright  VHI. All rights reserved.  Scapular Retraction (Standing)    With arms at sides, pinch shoulder blades together. Repeat __10__ times per set. Do __1__ sets per session. Do _3___ sessions per day.  http://orth.exer.us/944   Copyright  VHI. All rights reserved.

## 2016-02-04 NOTE — Telephone Encounter (Signed)
Surgery will be scheduled for July 27, patient is aware and detailed VM left for his case worker per his request.

## 2016-02-08 ENCOUNTER — Encounter (HOSPITAL_COMMUNITY): Payer: Self-pay | Admitting: Occupational Therapy

## 2016-02-08 ENCOUNTER — Ambulatory Visit (HOSPITAL_COMMUNITY): Payer: Medicare Other | Admitting: Occupational Therapy

## 2016-02-08 DIAGNOSIS — M25511 Pain in right shoulder: Secondary | ICD-10-CM

## 2016-02-08 DIAGNOSIS — R29898 Other symptoms and signs involving the musculoskeletal system: Secondary | ICD-10-CM

## 2016-02-08 DIAGNOSIS — M25611 Stiffness of right shoulder, not elsewhere classified: Secondary | ICD-10-CM | POA: Diagnosis not present

## 2016-02-08 NOTE — Therapy (Signed)
[place], [place], [address] Phone: [phone]   Fax:  [phone]  Occupational Therapy Treatment  Patient Details  Name: Kyle Collins MRN: [id number] Date of Birth: 1986/07/24 Referring Provider: Aline Brochure   Encounter Date: 02/08/2016      OT End of Session - 02/08/16 1414    Visit Number 2   Number of Visits 8   Date for OT Re-Evaluation 03/05/16   Authorization Type Medicare and Medicaid   Authorization Time Period before 10th visit   Authorization - Visit Number 2   Authorization - Number of Visits 10   OT Start Time [id number]   OT Stop Time 1415   OT Time Calculation (min) 41 min   Activity Tolerance Patient tolerated treatment well   Behavior During Therapy [facility] for tasks assessed/performed      Past Medical History  Diagnosis Date  . Hypertension   . Diabetes mellitus   . Thyroid disease   . Sleep apnea   . Depression   . Bipolar disorder ([place])   . GERD (gastroesophageal reflux disease)   . Headache     Past Surgical History  Procedure Laterality Date  . Hernia repair    . Tooth extraction  spring 2016    top left   . Shoulder closed reduction Right 12/04/2015    Procedure: CLOSED REDUCTION SHOULDER;  Surgeon: Carole Civil, MD;  Location: [facility];  Service: Orthopedics;  Laterality: Right;    There were no vitals filed for this visit.      Subjective Assessment - 02/08/16 1333    Subjective  S: I am having numbness along my upper arm.    Currently in Pain? No/denies            [facility] OT Assessment - 02/08/16 1333    Assessment   Diagnosis S/P Right Shoulder Dislocation and right greater tuberosity fracture   Precautions   Precautions Shoulder   Type of Shoulder Precautions no heavy lifting, no abduction combined with external or internal rotation                   OT Treatments/Exercises (OP) - 02/08/16 1337    Exercises   Exercises Shoulder   Shoulder  Exercises: Supine   Protraction PROM;10 reps   Horizontal ABduction PROM;10 reps   External Rotation PROM;10 reps   Internal Rotation PROM;10 reps   Flexion PROM;10 reps   ABduction PROM;10 reps   Shoulder Exercises: Seated   Elevation AROM;15 reps   Extension AROM;15 reps   Row AROM;15 reps   Shoulder Exercises: Therapy Ball   Flexion 10 reps   ABduction 10 reps   Shoulder Exercises: ROM/Strengthening   Thumb Tacks low thumb tacks 1'   Shoulder Exercises: Isometric Strengthening   Flexion Supine;3X5"   Extension Supine;3X5"   External Rotation Supine;3X5"   Internal Rotation Supine;3X5"   ABduction Supine;3X5"   ADduction Supine;3X5"   Manual Therapy   Manual Therapy Myofascial release   Manual therapy comments Manual therapy completed prior to therapeutic exercise.    Myofascial Release Myofascial release completed to right upper arm, trapezius, and scapularis regions to decrease pain and fascial restrictions and increase joint range of motion.                 OT Education - 02/08/16 1406    Education provided Yes   Education Details provided and reviewed evaluation   Person(s) Educated Patient   Methods Explanation;Handout  Comprehension Verbalized understanding          OT Short Term Goals - 02/08/16 1419    OT SHORT TERM GOAL #1   Title Patient will be educated and independent with a HEP for improving functional use of his right arm.   Time 2   Period Weeks   Status On-going   OT SHORT TERM GOAL #2   Title Patient will improve right shoulder P/ROM by 50% for increased functional use of right arm with daily tasks.   Time 2   Period Weeks   Status On-going   OT SHORT TERM GOAL #3   Title Patient will decrease intermittent pain to 7/10 in his right arm.   Time 2   Period Weeks   Status On-going           OT [place] Term Goals - 02/08/16 1419    OT Fettes TERM GOAL #1   Title Patient will improve right shoulder A/ROM by 25% for improved ability to  complete functional activities at shoulder level.   Time 4   Period Weeks   Status On-going   OT [place] TERM GOAL #2   Title Patient will have 3+/5 strength in his right shoulder for improved ability to lift items onto his counter top.   Time 4   Period Weeks   Status On-going   OT [place] TERM GOAL #3   Title Patient will decrease fascial restrictions to min-moderate in his right shoulder region for greater mobility needed for daily tasks.   Time 4   Period Weeks   Status On-going   OT [place] TERM GOAL #4   Title Patient will decrease pain level to 5/10 or better in his right shoulder region during functional activities.    Status On-going               Plan - 02/08/16 1415    Clinical Impression Statement A: Initiated myofascial release, manual therapy, P/ROM, isometrics, therapy ball exercises this session. Pt reports his shoulder has been feeling pretty good, no pain today. Pt reports a numb area the size of his hand along the right lateral upper arm, present since fall. Pt required intermittent verbal cuing for form during exercises, rest breaks as needed. Provided evaluation and reviewed with pt.    Rehab Potential Good   OT Frequency 2x / week   OT Duration 4 weeks   OT Treatment/Interventions Self-care/ADL training;Cryotherapy;Electrical Stimulation;Moist Heat;Ultrasound;Iontophoresis;Therapeutic exercise;Neuromuscular education;Energy conservation;DME and/or AE instruction;Passive range of motion;Manual Therapy;Therapeutic exercises;Therapeutic activities;Patient/family education   Plan P: Continue working to improve P/ROM to [facility], add AA/ROM as able to tolerate.    Consulted and Agree with Plan of Care Patient      Patient will benefit from skilled therapeutic intervention in order to improve the following deficits and impairments:  Decreased range of motion, Decreased strength, Increased muscle spasms, Increased fascial restricitons, Impaired UE functional use, Pain  Visit  Diagnosis: Pain in right shoulder  Stiffness of right shoulder, not elsewhere classified  Other symptoms and signs involving the musculoskeletal system    Problem List Patient Active Problem List   Diagnosis Date Noted  . Hyperglycemia 12/04/2015  . Fracture dislocation of right shoulder joint 12/04/2015  . Dislocation, shoulder closed   . Fracture, humerus, greater tuberosity   . Morbid obesity due to excess calories ([place]) 10/26/2015  . Uncontrolled type 2 diabetes mellitus without complication, without [place]-term current use of insulin ([place]) 07/16/2015  . Essential hypertension, benign 07/16/2015  . Personal  history of noncompliance with medical treatment, presenting hazards to health 07/16/2015    Guadelupe Sabin, OTR/L  [phone]  02/08/2016, 2:22 PM  [place], [place], [address] Phone: [phone]   Fax:  [phone]  Name: GRANITE DOWSETT MRN: [id number] Date of Birth: May 18, 1986

## 2016-02-11 ENCOUNTER — Ambulatory Visit (HOSPITAL_COMMUNITY): Payer: Medicare Other | Admitting: Specialist

## 2016-02-11 DIAGNOSIS — M25511 Pain in right shoulder: Secondary | ICD-10-CM

## 2016-02-11 DIAGNOSIS — R29898 Other symptoms and signs involving the musculoskeletal system: Secondary | ICD-10-CM | POA: Diagnosis not present

## 2016-02-11 DIAGNOSIS — M25611 Stiffness of right shoulder, not elsewhere classified: Secondary | ICD-10-CM | POA: Diagnosis not present

## 2016-02-11 NOTE — Therapy (Signed)
[place], [place], [address] Phone: [phone]   Fax:  [phone]  Occupational Therapy Treatment  Patient Details  Name: Kyle Collins MRN: [id number] Date of Birth: Sep 25, 1985 Referring Provider: Aline Brochure   Encounter Date: 02/11/2016      OT End of Session - 02/11/16 1012    Visit Number 3   Number of Visits 8   Date for OT Re-Evaluation 03/05/16   Authorization Type Medicare and Medicaid   Authorization Time Period before 10th visit   Authorization - Visit Number 3   Authorization - Number of Visits 10   OT Start Time [phone]  limited due to pending surgery   OT Stop Time 1030   OT Time Calculation (min) 36 min   Activity Tolerance Patient tolerated treatment well   Behavior During Therapy [facility] for tasks assessed/performed      Past Medical History  Diagnosis Date  . Hypertension   . Diabetes mellitus   . Thyroid disease   . Sleep apnea   . Depression   . Bipolar disorder ([place])   . GERD (gastroesophageal reflux disease)   . Headache     Past Surgical History  Procedure Laterality Date  . Hernia repair    . Tooth extraction  spring 2016    top left   . Shoulder closed reduction Right 12/04/2015    Procedure: CLOSED REDUCTION SHOULDER;  Surgeon: Carole Civil, MD;  Location: [facility];  Service: Orthopedics;  Laterality: Right;    There were no vitals filed for this visit.      Subjective Assessment - 02/11/16 0953    Subjective  S:  I am doing the exercises, they make me a bit sore.   Currently in Pain? Yes   Pain Score 2    Pain Location Shoulder   Pain Orientation Right   Pain Descriptors / Indicators Aching   Pain Type Acute pain   Pain Radiating Towards hand   Pain Onset More than a month ago   Pain Frequency Intermittent   Aggravating Factors  postiioning causes pain in right arm   Pain Relieving Factors repositioning   Effect of Pain on Daily Activities unable to use dominant  arm with daily tasks.            [facility] OT Assessment - 02/11/16 0001    Assessment   Diagnosis S/P Right Shoulder Dislocation and right greater tuberosity fracture   Precautions   Precautions Shoulder   Type of Shoulder Precautions no heavy lifting, no abduction combined with external or internal rotation                   OT Treatments/Exercises (OP) - 02/11/16 0001    Exercises   Exercises Shoulder   Shoulder Exercises: Supine   Protraction PROM;AAROM;10 reps   Horizontal ABduction PROM;10 reps   External Rotation PROM;AAROM;10 reps   Internal Rotation PROM;AAROM;10 reps   Flexion PROM;AAROM;10 reps   ABduction PROM;AAROM;10 reps   Shoulder Exercises: Seated   Elevation AROM;15 reps   Extension AROM;15 reps   Row AROM;15 reps   Shoulder Exercises: Pulleys   Flexion 1 minute   ABduction 1 minute   Shoulder Exercises: Therapy Ball   Flexion 15 reps   ABduction 15 reps   Shoulder Exercises: Isometric Strengthening   Flexion Supine;3X5"   Extension Supine;3X5"   External Rotation Supine;3X5"   Internal Rotation Supine;3X5"   ABduction Supine;3X5"   ADduction Supine;3X5"  Manual Therapy   Manual Therapy Myofascial release   Manual therapy comments Manual therapy completed prior to therapeutic exercise.    Myofascial Release Myofascial release completed to right upper arm, trapezius, and scapularis regions to decrease pain and fascial restrictions and increase joint range of motion.                   OT Short Term Goals - 02/08/16 1419    OT SHORT TERM GOAL #1   Title Patient will be educated and independent with a HEP for improving functional use of his right arm.   Time 2   Period Weeks   Status On-going   OT SHORT TERM GOAL #2   Title Patient will improve right shoulder P/ROM by 50% for increased functional use of right arm with daily tasks.   Time 2   Period Weeks   Status On-going   OT SHORT TERM GOAL #3   Title Patient will decrease  intermittent pain to 7/10 in his right arm.   Time 2   Period Weeks   Status On-going           OT [place] Term Goals - 02/08/16 1419    OT Kl[place] TERM GOAL #1   Title Patient will improve right shoulder A/ROM by 25% for improved ability to complete functional activities at shoulder level.   Time 4   Period Weeks   Status On-going   OT [place] TERM GOAL #2   Title Patient will have 3+/5 strength in his right shoulder for improved ability to lift items onto his counter top.   Time 4   Period Weeks   Status On-going   OT R[place] TERM GOAL #3   Title Patient will decrease fascial restrictions to min-moderate in his right shoulder region for greater mobility needed for daily tasks.   Time 4   Period Weeks   Status On-going   OT [place] TERM GOAL #4   Title Patient will decrease pain level to 5/10 or better in his right shoulder region during functional activities.    Status On-going               Plan - 02/11/16 1012    Clinical Impression Statement A:  patient with improved P/ROM this date.  began AA/ROM exercises, difficulty with abduction, external rotation.  did not complete horizontal abduction AA/ROM exercises due to restrictions   Plan P:  attempt AA/ROM in seated for protraction and flexion.  decrease pain with P/ROM for greater functional use at home.       Patient will benefit from skilled therapeutic intervention in order to improve the following deficits and impairments:     Visit Diagnosis: Pain in right shoulder  Stiffness of right shoulder, not elsewhere classified  Other symptoms and signs involving the musculoskeletal system    Problem List Patient Active Problem List   Diagnosis Date Noted  . Hyperglycemia 12/04/2015  . Fracture dislocation of right shoulder joint 12/04/2015  . Dislocation, shoulder closed   . Fracture, humerus, greater tuberosity   . Morbid obesity due to excess calories ([place]) 10/26/2015  . Uncontrolled type 2 diabetes mellitus without  complication, without [place]-term current use of insulin ([place]) 07/16/2015  . Essential hypertension, benign 07/16/2015  . Personal history of noncompliance with medical treatment, presenting hazards to health 07/16/2015   Vangie Bicker, [place], OTR/L [phone]  02/11/2016, 10:23 AM  [place], [place], [address] Phone:  [phone]   Fax:  [phone]  Name: Kyle Collins MRN: [id number] Date of Birth: July 14, 1986

## 2016-02-13 ENCOUNTER — Ambulatory Visit (HOSPITAL_COMMUNITY): Payer: Medicare Other

## 2016-02-13 ENCOUNTER — Encounter (HOSPITAL_COMMUNITY): Payer: Self-pay

## 2016-02-13 DIAGNOSIS — M25611 Stiffness of right shoulder, not elsewhere classified: Secondary | ICD-10-CM

## 2016-02-13 DIAGNOSIS — M25511 Pain in right shoulder: Secondary | ICD-10-CM

## 2016-02-13 DIAGNOSIS — R29898 Other symptoms and signs involving the musculoskeletal system: Secondary | ICD-10-CM

## 2016-02-13 NOTE — Therapy (Signed)
[place], [place], [address] Phone: [phone]   Fax:  [phone]  Occupational Therapy Treatment  Patient Details  Name: Kyle Collins MRN: [id number] Date of Birth: May 15, 1986 Referring Provider: Arther Abbott, MD  Encounter Date: 02/13/2016      OT End of Session - 02/13/16 1241    Visit Number 3   Number of Visits 8   Date for OT Re-Evaluation 03/05/16   Authorization Type Medicare and Medicaid   Authorization Time Period before 10th visit   Authorization - Visit Number 3   Authorization - Number of Visits 10   OT Start Time 1120   OT Stop Time 1200   OT Time Calculation (min) 40 min   Activity Tolerance Patient tolerated treatment well   Behavior During Therapy [facility] for tasks assessed/performed      Past Medical History  Diagnosis Date  . Hypertension   . Diabetes mellitus   . Thyroid disease   . Sleep apnea   . Depression   . Bipolar disorder ([place])   . GERD (gastroesophageal reflux disease)   . Headache     Past Surgical History  Procedure Laterality Date  . Hernia repair    . Tooth extraction  spring 2016    top left   . Shoulder closed reduction Right 12/04/2015    Procedure: CLOSED REDUCTION SHOULDER;  Surgeon: Carole Civil, MD;  Location: [facility];  Service: Orthopedics;  Laterality: Right;    There were no vitals filed for this visit.      Subjective Assessment - 02/13/16 1234    Subjective  S: I have my pre-op set for Friday   Currently in Pain? Yes   Pain Score 2    Pain Location Shoulder   Pain Orientation Right   Pain Descriptors / Indicators Aching   Pain Type Acute pain            [facility] OT Assessment - 02/13/16 1235    Assessment   Diagnosis S/P Right Shoulder Dislocation and right greater tuberosity fracture   Referring Provider Kyle Abbott, MD   Precautions   Precautions Shoulder   Type of Shoulder Precautions no heavy lifting, no abduction  combined with external or internal rotation                   OT Treatments/Exercises (OP) - 02/13/16 1143    Exercises   Exercises Shoulder   Shoulder Exercises: Supine   Protraction PROM;10 reps;AAROM;12 reps   Horizontal ABduction PROM;10 reps   External Rotation PROM;10 reps;AAROM;12 reps   Internal Rotation PROM;10 reps;AAROM;12 reps   Flexion PROM;10 reps;AAROM;12 reps   Shoulder Exercises: Seated   Elevation AROM;15 reps   Extension AROM;15 reps   Row AROM;15 reps   Shoulder Exercises: Standing   Protraction AAROM;12 reps   External Rotation AAROM;12 reps   Internal Rotation AAROM;12 reps   Flexion AAROM;12 reps   ABduction AAROM;12 reps   Shoulder Exercises: Pulleys   Flexion 1 minute   ABduction 1 minute   Shoulder Exercises: ROM/Strengthening   Wall Wash 1'   Thumb Tacks low thumb tacks 1'   Shoulder Exercises: Isometric Strengthening   Flexion Supine;3X5"   Extension Supine;3X5"   External Rotation Supine;3X5"   Internal Rotation Supine;3X5"   ABduction Supine;3X5"   ADduction Supine;3X5"   Manual Therapy   Manual Therapy Myofascial release   Manual therapy comments Manual therapy completed prior to therapeutic exercise.  Myofascial Release Myofascial release completed to right upper arm, trapezius, and scapularis regions to decrease pain and fascial restrictions and increase joint range of motion.                   OT Short Term Goals - 02/08/16 1419    OT SHORT TERM GOAL #1   Title Patient will be educated and independent with a HEP for improving functional use of his right arm.   Time 2   Period Weeks   Status On-going   OT SHORT TERM GOAL #2   Title Patient will improve right shoulder P/ROM by 50% for increased functional use of right arm with daily tasks.   Time 2   Period Weeks   Status On-going   OT SHORT TERM GOAL #3   Title Patient will decrease intermittent pain to 7/10 in his right arm.   Time 2   Period Weeks    Status On-going           OT [place] Term Goals - 02/08/16 1419    OT Dario TERM GOAL #1   Title Patient will improve right shoulder A/ROM by 25% for improved ability to complete functional activities at shoulder level.   Time 4   Period Weeks   Status On-going   OT [place] TERM GOAL #2   Title Patient will have 3+/5 strength in his right shoulder for improved ability to lift items onto his counter top.   Time 4   Period Weeks   Status On-going   OT Val[place] TERM GOAL #3   Title Patient will decrease fascial restrictions to min-moderate in his right shoulder region for greater mobility needed for daily tasks.   Time 4   Period Weeks   Status On-going   OT Stirn TERM GOAL #4   Title Patient will decrease pain level to 5/10 or better in his right shoulder region during functional activities.    Status On-going               Plan - 02/13/16 1242    Clinical Impression Statement A: Pt was able to complete AA/ROM standing this date. Added wall wash as well. VC for form and technique.    Plan P: Cont to work on increasing ROM needed to complete functional reaching tasks. Continue with therapy ball exercises.       Patient will benefit from skilled therapeutic intervention in order to improve the following deficits and impairments:  Decreased range of motion, Decreased strength, Increased muscle spasms, Increased fascial restricitons, Impaired UE functional use, Pain  Visit Diagnosis: Pain in right shoulder  Stiffness of right shoulder, not elsewhere classified  Other symptoms and signs involving the musculoskeletal system    Problem List Patient Active Problem List   Diagnosis Date Noted  . Hyperglycemia 12/04/2015  . Fracture dislocation of right shoulder joint 12/04/2015  . Dislocation, shoulder closed   . Fracture, humerus, greater tuberosity   . Morbid obesity due to excess calories ([place]) 10/26/2015  . Uncontrolled type 2 diabetes mellitus without complication, without  [place]-term current use of insulin ([place]) 07/16/2015  . Essential hypertension, benign 07/16/2015  . Personal history of noncompliance with medical treatment, presenting hazards to health 07/16/2015    Ailene Ravel, OTR/L,CBIS  [phone]  02/13/2016, 12:46 PM  [place], [place], [address] Phone: [phone]   Fax:  [phone]  Name: Kyle Collins MRN: [id number] Date of Birth: [birth year]

## 2016-02-14 NOTE — Patient Instructions (Signed)
Kyle Collins  02/14/2016     @PREFPERIOPPHARMACY @   Your procedure is scheduled on  02/21/2016   Report to [name] at  615  A.M.  Call this number if you have problems the morning of surgery:  [phone]   Remember:  Do not eat food or drink liquids after midnight.  Take these medicines the morning of surgery with A SIP OF WATER  Lexapro, lisinopril.   Do not wear jewelry, make-up or nail polish.  Do not wear lotions, powders, or perfumes.  You may wear deoderant.  Do not shave 48 hours prior to surgery.  Men may shave face and neck.  Do not bring valuables to the hospital.  [facility] is not responsible for any belongings or valuables.  Contacts, dentures or bridgework may not be worn into surgery.  Leave your suitcase in the car.  After surgery it may be brought to your room.  For patients admitted to the hospital, discharge time will be determined by your treatment team.  Patients discharged the day of surgery will not be allowed to drive home.   Name and phone number of your driver:   family Special instructions:  none  Please read over the following fact sheets that you were given. Coughing and Deep Breathing, Surgical Site Infection Prevention, Anesthesia Post-op Instructions and Care and Recovery After Surgery      Shoulder Fracture You have a fractured humerus (bone in the upper arm) at the shoulder just below the ball of the shoulder joint. Most of the time the bones of a broken shoulder are in an acceptable position. Usually the injury can be treated with a shoulder immobilizer or sling and swath bandage. These devices support the arm and prevent any shoulder movement. If the bones are not in a good position, then surgery is sometimes needed. Shoulder fractures usually cause swelling, pain, and discoloration around the upper arm initially. They heal in 8-12 weeks with proper treatment. Rest in bed or a reclining chair as Amyx as your shoulder is  very painful. Sitting up generally results in less pain at the fracture site. Do not remove your shoulder bandage until your caregiver approves. You may apply ice packs over the shoulder for 20-30 minutes every 2 hours for the next 2-3 days to reduce the pain and swelling. Use your pain medicine as prescribed.  SEEK IMMEDIATE MEDICAL CARE IF:  You develop severe shoulder pain unrelieved by rest and taking pain medicine.  You have pain, numbness, tingling, or weakness in the hand or wrist.  You develop shortness of breath, chest pain, severe weakness, or fainting.  You have severe pain with motion of the fingers or wrist. MAKE SURE YOU:   Understand these instructions.  Will watch your condition.  Will get help right away if you are not doing well or get worse.   This information is not intended to replace advice given to you by your health care provider. Make sure you discuss any questions you have with your health care provider.   Document Released: 08/21/2004 Document Revised: 10/06/2011 Document Reviewed: 11/01/2008 Elsevier Interactive Patient Education 2016 [place] Anesthesia, Adult General anesthesia is a sleep-like state of non-feeling produced by medicines (anesthetics). General anesthesia prevents you from being alert and feeling pain during a medical procedure. Your caregiver may recommend general anesthesia if your procedure:  Is [place].  Is painful or uncomfortable.  Would be frightening to see  or hear.  Requires you to be still.  Affects your breathing.  Causes significant blood loss. LET YOUR CAREGIVER KNOW ABOUT:  Allergies to food or medicine.  Medicines taken, including vitamins, herbs, eyedrops, over-the-counter medicines, and creams.  Use of steroids (by mouth or creams).  Previous problems with anesthetics or numbing medicines, including problems experienced by relatives.  History of bleeding problems or blood clots.  Previous surgeries  and types of anesthetics received.  Possibility of pregnancy, if this applies.  Use of cigarettes, alcohol, or illegal drugs.  Any health condition(s), especially diabetes, sleep apnea, and high blood pressure. RISKS AND COMPLICATIONS General anesthesia rarely causes complications. However, if complications do occur, they can be life threatening. Complications include:  A lung infection.  A stroke.  A heart attack.  Waking up during the procedure. When this occurs, the patient may be unable to move and communicate that he or she is awake. The patient may feel severe pain. Older adults and adults with serious medical problems are more likely to have complications than adults who are young and healthy. Some complications can be prevented by answering all of your caregiver's questions thoroughly and by following all pre-procedure instructions. It is important to tell your caregiver if any of the pre-procedure instructions, especially those related to diet, were not followed. Any food or liquid in the stomach can cause problems when you are under general anesthesia. BEFORE THE PROCEDURE  Ask your caregiver if you will have to spend the night at the hospital. If you will not have to spend the night, arrange to have an adult drive you and stay with you for 24 hours.  Follow your caregiver's instructions if you are taking dietary supplements or medicines. Your caregiver may tell you to stop taking them or to reduce your dosage.  Do not smoke for as [place] as possible before your procedure. If possible, stop smoking 3-6 weeks before the procedure.  Do not take new dietary supplements or medicines within 1 week of your procedure unless your caregiver approves them.  Do not eat within 8 hours of your procedure or as directed by your caregiver. Drink only clear liquids, such as water, black coffee (without milk or cream), and fruit juices (without pulp).  Do not drink within 3 hours of your  procedure or as directed by your caregiver.  You may brush your teeth on the morning of the procedure, but make sure to spit out the toothpaste and water when finished. PROCEDURE  You will receive anesthetics through a mask, through an intravenous (IV) access tube, or through both. A doctor who specializes in anesthesia (anesthesiologist) or a nurse who specializes in anesthesia (nurse anesthetist) or both will stay with you throughout the procedure to make sure you remain unconscious. He or she will also watch your blood pressure, pulse, and oxygen levels to make sure that the anesthetics do not cause any problems. Once you are asleep, a breathing tube or mask may be used to help you breathe. AFTER THE PROCEDURE You will wake up after the procedure is complete. You may be in the room where the procedure was performed or in a recovery area. You may have a sore throat if a breathing tube was used. You may also feel:  Dizzy.  Weak.  Drowsy.  Confused.  Nauseous.  Cold. These are all normal responses and can be expected to last for up to 24 hours after the procedure is complete. A caregiver will tell you when you are  ready to go home. This will usually be when you are fully awake and in stable condition.   This information is not intended to replace advice given to you by your health care provider. Make sure you discuss any questions you have with your health care provider.   Document Released: 10/21/2007 Document Revised: 08/04/2014 Document Reviewed: 11/12/2011 Elsevier Interactive Patient Education 2016 [place] Anesthesia, Adult, Care After Refer to this sheet in the next few weeks. These instructions provide you with information on caring for yourself after your procedure. Your health care provider may also give you more specific instructions. Your treatment has been planned according to current medical practices, but problems sometimes occur. Call your health care provider  if you have any problems or questions after your procedure. WHAT TO EXPECT AFTER THE PROCEDURE After the procedure, it is typical to experience:  Sleepiness.  Nausea and vomiting. HOME CARE INSTRUCTIONS  For the first 24 hours after general anesthesia:  Have a responsible person with you.  Do not drive a car. If you are alone, do not take public transportation.  Do not drink alcohol.  Do not take medicine that has not been prescribed by your health care provider.  Do not sign important papers or make important decisions.  You may resume a normal diet and activities as directed by your health care provider.  Change bandages (dressings) as directed.  If you have questions or problems that seem related to general anesthesia, call the hospital and ask for the anesthetist or anesthesiologist on call. SEEK MEDICAL CARE IF:  You have nausea and vomiting that continue the day after anesthesia.  You develop a rash. SEEK IMMEDIATE MEDICAL CARE IF:   You have difficulty breathing.  You have chest pain.  You have any allergic problems.   This information is not intended to replace advice given to you by your health care provider. Make sure you discuss any questions you have with your health care provider.   Document Released: 10/20/2000 Document Revised: 08/04/2014 Document Reviewed: 11/12/2011 Elsevier Interactive Patient Education [employer].

## 2016-02-18 ENCOUNTER — Encounter (HOSPITAL_COMMUNITY)
Admission: RE | Admit: 2016-02-18 | Discharge: 2016-02-18 | Disposition: A | Discharge status: Home / Self Care | Payer: Medicare Other | Source: Ambulatory Visit | Attending: Orthopedic Surgery | Admitting: Orthopedic Surgery

## 2016-02-19 ENCOUNTER — Ambulatory Visit (HOSPITAL_COMMUNITY): Payer: Medicare Other

## 2016-02-19 DIAGNOSIS — M25611 Stiffness of right shoulder, not elsewhere classified: Secondary | ICD-10-CM

## 2016-02-19 DIAGNOSIS — M25511 Pain in right shoulder: Secondary | ICD-10-CM | POA: Diagnosis not present

## 2016-02-19 DIAGNOSIS — R29898 Other symptoms and signs involving the musculoskeletal system: Secondary | ICD-10-CM

## 2016-02-19 NOTE — Therapy (Signed)
[place], [place], [address] Phone: [phone]   Fax:  [phone]  Occupational Therapy Treatment  Patient Details  Name: Kyle Collins MRN: [id number] Date of Birth: November 15, 1985 Referring Provider: Arther Abbott, MD  Encounter Date: 02/19/2016      OT End of Session - 02/19/16 1205    Visit Number 5   Number of Visits 8   Date for OT Re-Evaluation 03/05/16   Authorization Type Medicare and Medicaid   Authorization Time Period before 10th visit   Authorization - Visit Number 5   Authorization - Number of Visits 10   OT Start Time 1030   OT Stop Time 1115   OT Time Calculation (min) 45 min   Activity Tolerance Patient tolerated treatment well   Behavior During Therapy [facility] for tasks assessed/performed      Past Medical History:  Diagnosis Date  . Bipolar disorder ([place])   . Depression   . Diabetes mellitus   . GERD (gastroesophageal reflux disease)   . Headache   . Hypertension   . Sleep apnea   . Thyroid disease     Past Surgical History:  Procedure Laterality Date  . HERNIA REPAIR    . SHOULDER CLOSED REDUCTION Right 12/04/2015   Procedure: CLOSED REDUCTION SHOULDER;  Surgeon: Carole Civil, MD;  Location: [facility];  Service: Orthopedics;  Laterality: Right;  . TOOTH EXTRACTION  spring 2016   top left     There were no vitals filed for this visit.      Subjective Assessment - 02/19/16 1202    Subjective  S: They changed my pre-op to the 31st and my surgery to 3rd.    Currently in Pain? Yes   Pain Score 4    Pain Location Shoulder   Pain Orientation Right   Pain Descriptors / Indicators Aching   Pain Type Acute pain            [facility] OT Assessment - 02/19/16 1204      Assessment   Diagnosis S/P Right Shoulder Dislocation and right greater tuberosity fracture     Precautions   Precautions Shoulder   Type of Shoulder Precautions no heavy lifting, no abduction combined with  external or internal rotation                   OT Treatments/Exercises (OP) - 02/19/16 1056      Exercises   Exercises Shoulder     Shoulder Exercises: Supine   Protraction PROM;10 reps;AAROM;12 reps   Horizontal ABduction PROM;10 reps   External Rotation PROM;10 reps;AAROM;12 reps   Internal Rotation PROM;10 reps;AAROM;12 reps   Flexion PROM;10 reps;AAROM;12 reps   ABduction PROM;AAROM;10 reps     Shoulder Exercises: Seated   Elevation AROM;15 reps   Extension AROM;15 reps   Row AROM;15 reps     Shoulder Exercises: Standing   Protraction AAROM;12 reps   Flexion AAROM;12 reps     Shoulder Exercises: Pulleys   Flexion 1 minute   ABduction 1 minute     Shoulder Exercises: Therapy Ball   Flexion 15 reps   ABduction 15 reps     Shoulder Exercises: ROM/Strengthening   Wall Wash 1'   Thumb Tacks 1'     Shoulder Exercises: Isometric Strengthening   Flexion Supine;5X5"   Extension Supine;5X5"   External Rotation Supine;5X5"   Internal Rotation Supine;5X5"   ABduction Supine;5X5"   ADduction Supine;5X5"     Manual Therapy  Manual Therapy Myofascial release   Manual therapy comments Manual therapy completed prior to therapeutic exercise.    Myofascial Release Myofascial release completed to right upper arm, trapezius, and scapularis regions to decrease pain and fascial restrictions and increase joint range of motion.                   OT Short Term Goals - 02/08/16 1419      OT SHORT TERM GOAL #1   Title Patient will be educated and independent with a HEP for improving functional use of his right arm.   Time 2   Period Weeks   Status On-going     OT SHORT TERM GOAL #2   Title Patient will improve right shoulder P/ROM by 50% for increased functional use of right arm with daily tasks.   Time 2   Period Weeks   Status On-going     OT SHORT TERM GOAL #3   Title Patient will decrease intermittent pain to 7/10 in his right arm.   Time 2    Period Weeks   Status On-going           OT [place] Term Goals - 02/08/16 1419      OT [place] TERM GOAL #1   Title Patient will improve right shoulder A/ROM by 25% for improved ability to complete functional activities at shoulder level.   Time 4   Period Weeks   Status On-going     OT Aye TERM GOAL #2   Title Patient will have 3+/5 strength in his right shoulder for improved ability to lift items onto his counter top.   Time 4   Period Weeks   Status On-going     OT [place] TERM GOAL #3   Title Patient will decrease fascial restrictions to min-moderate in his right shoulder region for greater mobility needed for daily tasks.   Time 4   Period Weeks   Status On-going     OT Abend TERM GOAL #4   Title Patient will decrease pain level to 5/10 or better in his right shoulder region during functional activities.    Status On-going               Plan - 02/19/16 1206    Clinical Impression Statement A: Pt with increased pain noted during wall wash this session. Continues to require VC for form and technique. Overall completes exercises good control of movement.    Plan P: Increase AA/ROM repetitions to 15.      Patient will benefit from skilled therapeutic intervention in order to improve the following deficits and impairments:  Decreased range of motion, Decreased strength, Increased muscle spasms, Increased fascial restricitons, Impaired UE functional use, Pain  Visit Diagnosis: Pain in right shoulder  Stiffness of right shoulder, not elsewhere classified  Other symptoms and signs involving the musculoskeletal system    Problem List Patient Active Problem List   Diagnosis Date Noted  . Hyperglycemia 12/04/2015  . Fracture dislocation of right shoulder joint 12/04/2015  . Dislocation, shoulder closed   . Fracture, humerus, greater tuberosity   . Morbid obesity due to excess calories ([place]) 10/26/2015  . Uncontrolled type 2 diabetes mellitus without complication,  without [place]-term current use of insulin ([place]) 07/16/2015  . Essential hypertension, benign 07/16/2015  . Personal history of noncompliance with medical treatment, presenting hazards to health 07/16/2015   Ailene Ravel, OTR/L,CBIS  [phone]  02/19/2016, 12:12 PM  [address]  [place], [place], [address] Phone: [phone]   Fax:  [phone]  Name: Kyle Collins MRN: [id number] Date of Birth: 1985/11/06

## 2016-02-21 ENCOUNTER — Ambulatory Visit (HOSPITAL_COMMUNITY): Payer: Medicare Other

## 2016-02-21 ENCOUNTER — Encounter (HOSPITAL_COMMUNITY): Payer: Self-pay

## 2016-02-21 DIAGNOSIS — M25511 Pain in right shoulder: Secondary | ICD-10-CM

## 2016-02-21 DIAGNOSIS — M25611 Stiffness of right shoulder, not elsewhere classified: Secondary | ICD-10-CM | POA: Diagnosis not present

## 2016-02-21 DIAGNOSIS — R29898 Other symptoms and signs involving the musculoskeletal system: Secondary | ICD-10-CM | POA: Diagnosis not present

## 2016-02-21 NOTE — Therapy (Addendum)
[place], [place], [address] Phone: [phone]   Fax:  [phone]  Occupational Therapy Treatment  Patient Details  Name: Kyle Collins MRN: [id number] Date of Birth: 01-08-1986 Referring Provider: Arther Abbott, MD  Encounter Date: 02/21/2016      OT End of Session - 02/21/16 1200    Visit Number 6   Number of Visits 8   Date for OT Re-Evaluation 03/05/16   Authorization Type Medicare and Medicaid   Authorization Time Period before 10th visit   Authorization - Visit Number 6   Authorization - Number of Visits 10   OT Start Time 7989   OT Stop Time 1115   OT Time Calculation (min) 40 min   Activity Tolerance Patient tolerated treatment well   Behavior During Therapy [facility] for tasks assessed/performed      Past Medical History:  Diagnosis Date  . Bipolar disorder ([place])   . Depression   . Diabetes mellitus   . GERD (gastroesophageal reflux disease)   . Headache   . Hypertension   . Sleep apnea   . Thyroid disease     Past Surgical History:  Procedure Laterality Date  . HERNIA REPAIR    . SHOULDER CLOSED REDUCTION Right 12/04/2015   Procedure: CLOSED REDUCTION SHOULDER;  Surgeon: Carole Civil, MD;  Location: [facility];  Service: Orthopedics;  Laterality: Right;  . TOOTH EXTRACTION  spring 2016   top left     There were no vitals filed for this visit.      Subjective Assessment - 02/21/16 1158    Subjective  S: I'm a little sore. I may have slept on it.    Currently in Pain? Yes   Pain Score 2    Pain Location Shoulder   Pain Orientation Right            [facility] OT Assessment - 02/21/16 1159      Assessment   Diagnosis S/P Right Shoulder Dislocation and right greater tuberosity fracture     Precautions   Precautions Shoulder   Type of Shoulder Precautions no heavy lifting, no abduction combined with external or internal rotation                   OT  Treatments/Exercises (OP) - 02/21/16 1159      Exercises   Exercises Shoulder     Shoulder Exercises: Supine   Protraction PROM;10 reps;AAROM;15 reps   Horizontal ABduction PROM;10 reps;AAROM;15 reps   External Rotation PROM;10 reps;AAROM;15 reps   Internal Rotation PROM;10 reps;AAROM;15 reps   Flexion PROM;10 reps;AAROM;15 reps   ABduction PROM;10 reps;AAROM;15 reps     Shoulder Exercises: Pulleys   Flexion 1 minute   ABduction 1 minute     Shoulder Exercises: ROM/Strengthening   Wall Wash 1'   Thumb Tacks 1'   Proximal Shoulder Strengthening, Supine 12X no rest breaks   Proximal Shoulder Strengthening, Seated 12X no rest breaks     Manual Therapy   Manual Therapy Myofascial release   Manual therapy comments Manual therapy completed prior to therapeutic exercise.    Myofascial Release Myofascial release completed to right upper arm, trapezius, and scapularis regions to decrease pain and fascial restrictions and increase joint range of motion.                   OT Short Term Goals - 02/08/16 1419      OT SHORT TERM GOAL #1   Title  Patient will be educated and independent with a HEP for improving functional use of his right arm.   Time 2   Period Weeks   Status On-going     OT SHORT TERM GOAL #2   Title Patient will improve right shoulder P/ROM by 50% for increased functional use of right arm with daily tasks.   Time 2   Period Weeks   Status On-going     OT SHORT TERM GOAL #3   Title Patient will decrease intermittent pain to 7/10 in his right arm.   Time 2   Period Weeks   Status On-going           OT [place] Term Goals - 02/08/16 1419      OT [place] TERM GOAL #1   Title Patient will improve right shoulder A/ROM by 25% for improved ability to complete functional activities at shoulder level.   Time 4   Period Weeks   Status On-going     OT [place] TERM GOAL #2   Title Patient will have 3+/5 strength in his right shoulder for improved ability to lift  items onto his counter top.   Time 4   Period Weeks   Status On-going     OT [place] TERM GOAL #3   Title Patient will decrease fascial restrictions to min-moderate in his right shoulder region for greater mobility needed for daily tasks.   Time 4   Period Weeks   Status On-going     OT Senn TERM GOAL #4   Title Patient will decrease pain level to 5/10 or better in his right shoulder region during functional activities.    Status On-going               Plan - 02/21/16 1201    Clinical Impression Statement A: Increased AA/ROM to 15 repetitions without increased pain. patient does not complete full range of motion during abduction or flexion at this time. VC for form and technique especially when completing seated exercises regarding seated posture.    Plan P: Continue to with AA/ROM seated. Attempt A/ROM supine. D/C next session due to upcoming surgery.      Patient will benefit from skilled therapeutic intervention in order to improve the following deficits and impairments:  Decreased range of motion, Decreased strength, Increased muscle spasms, Increased fascial restricitons, Impaired UE functional use, Pain  Visit Diagnosis: Pain in right shoulder  Stiffness of right shoulder, not elsewhere classified  Other symptoms and signs involving the musculoskeletal system      G-Codes - 29-Feb-2016 1136    Functional Assessment Tool Used Clinical judgement   Functional Limitation Carrying, moving and handling objects       Carrying, Moving and Handling Objects Goal Status  At least 20 percent but less than 40 percent impaired, limited or restricted   Carrying, Moving and Handling Objects Discharge Status At least 40 percent but less than 60 percent impaired, limited or restricted    Problem List Patient Active Problem List   Diagnosis Date Noted  . Hyperglycemia 12/04/2015  . Fracture dislocation of right shoulder joint 12/04/2015  . Dislocation, shoulder closed   .  Fracture, humerus, greater tuberosity   . Morbid obesity due to excess calories ([place]) 10/26/2015  . Uncontrolled type 2 diabetes mellitus without complication, without [place]-term current use of insulin ([place]) 07/16/2015  . Essential hypertension, benign 07/16/2015  . Personal history of noncompliance with medical treatment, presenting hazards to health 07/16/2015   Ailene Ravel, OTR/L,CBIS  [phone]  02/21/2016, 12:04 PM  [place], [place], [address] Phone: [phone]   Fax:  [phone]  Name: Kyle Collins MRN: [id number] Date of Birth: 11-Dec-1985    OCCUPATIONAL THERAPY DISCHARGE SUMMARY  Visits from Start of Care: 6  Current functional level related to goals / functional outcomes: Pt is requesting to be discharged due to impending shoulder surgery, pt has made improvements during therapy sessions including decreasing pain and increasing range of motion and strength of RUE.   Remaining deficits: Pt continues with have limitations with decreased range of motion, strength, and functional use of RUE.    Education / Equipment: Pt has been educated on updated HEPs including table slides and AA/ROM.  Plan: Patient agrees to discharge.  Patient goals were partially met. Patient is being discharged due to the patient's request.  ?????

## 2016-02-22 NOTE — Patient Instructions (Signed)
    Kyle Collins  02/22/2016     @PREFPERIOPPHARMACY @   Your procedure is scheduled on 02/28/2016.  Report to [name] at 6:15 A.M.  Call this number if you have problems the morning of surgery:  [phone]   Remember:  Do not eat food or drink liquids after midnight.  Take these medicines the morning of surgery with A SIP OF WATER Lexapro, Lisinopril  Do not take diabetic medications morning of procedure    Do not wear jewelry, make-up or nail polish.  Do not wear lotions, powders, or perfumes.  You may wear deoderant.  Do not shave 48 hours prior to surgery.  Men may shave face and neck.  Do not bring valuables to the hospital.  [name] is not responsible for any belongings or valuables.  Contacts, dentures or bridgework may not be worn into surgery.  Leave your suitcase in the car.  After surgery it may be brought to your room.  For patients admitted to the hospital, discharge time will be determined by your treatment team.  Patients discharged the day of surgery will not be allowed to drive home.    Please read over the following fact sheets that you were given. Surgical Site Infection Prevention and Anesthesia Post-op Instructions     PATIENT INSTRUCTIONS POST-ANESTHESIA  IMMEDIATELY FOLLOWING SURGERY:  Do not drive or operate machinery for the first twenty four hours after surgery.  Do not make any important decisions for twenty four hours after surgery or while taking narcotic pain medications or sedatives.  If you develop intractable nausea and vomiting or a severe headache please notify your doctor immediately.  FOLLOW-UP:  Please make an appointment with your surgeon as instructed. You do not need to follow up with anesthesia unless specifically instructed to do so.  WOUND CARE INSTRUCTIONS (if applicable):  Keep a dry clean dressing on the anesthesia/puncture wound site if there is drainage.  Once the wound has quit draining you may leave it open to air.   Generally you should leave the bandage intact for twenty four hours unless there is drainage.  If the epidural site drains for more than 36-48 hours please call the anesthesia department.  QUESTIONS?:  Please feel free to call your physician or the hospital operator if you have any questions, and they will be happy to assist you.

## 2016-02-25 ENCOUNTER — Telehealth (HOSPITAL_COMMUNITY): Payer: Self-pay

## 2016-02-25 ENCOUNTER — Encounter (HOSPITAL_COMMUNITY): Payer: Self-pay

## 2016-02-25 ENCOUNTER — Encounter (HOSPITAL_COMMUNITY)
Admission: RE | Admit: 2016-02-25 | Discharge: 2016-02-25 | Disposition: A | Discharge status: Home / Self Care | Payer: Medicare Other | Source: Ambulatory Visit | Attending: Orthopedic Surgery | Admitting: Orthopedic Surgery

## 2016-02-25 ENCOUNTER — Ambulatory Visit: Payer: Medicare Other | Admitting: Orthopedic Surgery

## 2016-02-25 DIAGNOSIS — K219 Gastro-esophageal reflux disease without esophagitis: Secondary | ICD-10-CM | POA: Diagnosis not present

## 2016-02-25 DIAGNOSIS — R51 Headache: Secondary | ICD-10-CM | POA: Diagnosis not present

## 2016-02-25 DIAGNOSIS — R631 Polydipsia: Secondary | ICD-10-CM | POA: Diagnosis not present

## 2016-02-25 DIAGNOSIS — E119 Type 2 diabetes mellitus without complications: Secondary | ICD-10-CM | POA: Diagnosis not present

## 2016-02-25 DIAGNOSIS — E079 Disorder of thyroid, unspecified: Secondary | ICD-10-CM | POA: Diagnosis not present

## 2016-02-25 DIAGNOSIS — F329 Major depressive disorder, single episode, unspecified: Secondary | ICD-10-CM | POA: Diagnosis not present

## 2016-02-25 DIAGNOSIS — S42253A Displaced fracture of greater tuberosity of unspecified humerus, initial encounter for closed fracture: Secondary | ICD-10-CM | POA: Diagnosis present

## 2016-02-25 DIAGNOSIS — R0602 Shortness of breath: Secondary | ICD-10-CM | POA: Diagnosis not present

## 2016-02-25 DIAGNOSIS — G4730 Sleep apnea, unspecified: Secondary | ICD-10-CM | POA: Diagnosis not present

## 2016-02-25 DIAGNOSIS — F419 Anxiety disorder, unspecified: Secondary | ICD-10-CM | POA: Diagnosis not present

## 2016-02-25 DIAGNOSIS — Z833 Family history of diabetes mellitus: Secondary | ICD-10-CM | POA: Diagnosis not present

## 2016-02-25 DIAGNOSIS — I10 Essential (primary) hypertension: Secondary | ICD-10-CM | POA: Diagnosis not present

## 2016-02-25 DIAGNOSIS — Z87891 Personal history of nicotine dependence: Secondary | ICD-10-CM | POA: Diagnosis not present

## 2016-02-25 DIAGNOSIS — Z888 Allergy status to other drugs, medicaments and biological substances status: Secondary | ICD-10-CM | POA: Diagnosis not present

## 2016-02-25 DIAGNOSIS — Y939 Activity, unspecified: Secondary | ICD-10-CM | POA: Diagnosis not present

## 2016-02-25 DIAGNOSIS — Z6841 Body Mass Index (BMI) 40.0 and over, adult: Secondary | ICD-10-CM | POA: Diagnosis not present

## 2016-02-25 DIAGNOSIS — S42251A Displaced fracture of greater tuberosity of right humerus, initial encounter for closed fracture: Secondary | ICD-10-CM | POA: Diagnosis not present

## 2016-02-25 DIAGNOSIS — W06XXXA Fall from bed, initial encounter: Secondary | ICD-10-CM | POA: Diagnosis not present

## 2016-02-25 DIAGNOSIS — F319 Bipolar disorder, unspecified: Secondary | ICD-10-CM | POA: Diagnosis not present

## 2016-02-25 HISTORY — DX: Anxiety disorder, unspecified: F41.9

## 2016-02-25 LAB — BASIC METABOLIC PANEL
Anion gap: 6 (ref 5–15)
BUN: 16 mg/dL (ref 6–20)
CHLORIDE: 101 mmol/L (ref 101–111)
CO2: 25 mmol/L (ref 22–32)
CREATININE: 0.64 mg/dL (ref 0.61–1.24)
Calcium: 9.2 mg/dL (ref 8.9–10.3)
GFR calc Af Amer: >60 mL/min (ref >60)
GFR calc non Af Amer: >60 mL/min (ref >60)
GLUCOSE: 276 mg/dL — AB (ref 65–99)
POTASSIUM: 4.2 mmol/L (ref 3.5–5.1)
Sodium: 132 mmol/L — ABNORMAL LOW (ref 135–145)

## 2016-02-25 LAB — SURGICAL PCR SCREEN
MRSA, PCR: POSITIVE — AB
Staphylococcus aureus: POSITIVE — AB

## 2016-02-25 LAB — CBC
HEMATOCRIT: 45 % (ref 39.0–52.0)
HEMOGLOBIN: 15 g/dL (ref 13.0–17.0)
MCH: 29.7 pg (ref 26.0–34.0)
MCHC: 33.3 g/dL (ref 30.0–36.0)
MCV: 89.1 fL (ref 78.0–100.0)
Platelets: 252 10*3/uL (ref 150–400)
RBC: 5.05 MIL/uL (ref 4.22–5.81)
RDW: 13 % (ref 11.5–15.5)
WBC: 6.7 10*3/uL (ref 4.0–10.5)

## 2016-02-25 NOTE — Telephone Encounter (Signed)
Wife called and states that patient will be having shoulder surgery soon. She wants this referral to be put on hold until after that at that time she will call us back to re-schedule sometime in Sept. NF 02/25/16

## 2016-02-26 ENCOUNTER — Ambulatory Visit (HOSPITAL_COMMUNITY): Payer: Medicare Other

## 2016-02-27 NOTE — H&P (Signed)
Kyle Collins is an 30 y.o. male.   Chief Complaint: Pain right shoulder HPI: 30 year old male presents with close reduction of right shoulder fracture dislocation with greater tuberosity comminuted fracture. Presents now for fixation of said fracture. Patient has had delay in surgery because of unmanageable diabetes  His complaints were pain stiffness loss of motion aching over the right greater tuberosity and deltoid area.  Review of systems below  Past Medical History:  Diagnosis Date  . Anxiety   . Bipolar disorder ([place])   . Depression   . Diabetes mellitus   . GERD (gastroesophageal reflux disease)   . Headache   . Hypertension   . Sleep apnea   . Thyroid disease     Past Surgical History:  Procedure Laterality Date  . HERNIA REPAIR     scrotum  . SHOULDER CLOSED REDUCTION Right 12/04/2015   Procedure: CLOSED REDUCTION SHOULDER;  Surgeon: Carole Civil, MD;  Location: [facility];  Service: Orthopedics;  Laterality: Right;  . TOOTH EXTRACTION  spring 2016   top left     Family History  Problem Relation Age of Onset  . Diabetes Mother   . Diabetes Father    Social History:  reports that he quit smoking about 7 years ago. His smoking use included E-cigarettes. He has a 2.50 pack-year smoking history. He has never used smokeless tobacco. He reports that he drinks alcohol. He reports that he does not use drugs.  Allergies:  Allergies  Allergen Reactions  . Methylphenidate Derivatives Other (See Comments)    Patient states it made him "act like a zombie" when given as a child  . Ritalin [Methylphenidate Hcl]     No prescriptions prior to admission.    No results found for this or any previous visit (from the past 48 hour(s)). No results found.  Review of Systems  Constitutional: Negative for chills.  Eyes: Negative.   Respiratory: Negative for shortness of breath.   Cardiovascular: Negative for chest pain.  Gastrointestinal: Negative for heartburn.   Genitourinary: Negative for dysuria.  Musculoskeletal: Positive for falls and joint pain. Negative for myalgias and neck pain.  Skin: Negative for rash.  Neurological: Negative.  Negative for headaches.  Endo/Heme/Allergies: Negative for environmental allergies and polydipsia. Does not bruise/bleed easily.  Psychiatric/Behavioral: Positive for depression.    There were no vitals taken for this visit. Physical Exam  Constitutional: He is oriented to person, place, and time. He appears well-developed and well-nourished. No distress.  HENT:  Head: Normocephalic and atraumatic.  Right Ear: External ear normal.  Left Ear: External ear normal.  Eyes: Conjunctivae are normal. Pupils are equal, round, and reactive to light. Right eye exhibits no discharge. Left eye exhibits no discharge. No scleral icterus.  Neck: Normal range of motion. Neck supple. No JVD present. No tracheal deviation present.  Musculoskeletal:  On most recent examination patient had painful range of motion in the right shoulder with no swelling but tenderness in the greater tuberosity and rotator interval. His stability tests were unreliable due to muscle guarding. Weakness in his rotator cuff supraspinatus with unreliable external rotation exam normal internal rotation strength skin normal. Pulses and sensation normal and the right arm. Lymph nodes normal.  His other extremities show no clubbing cyanosis or edema no contracture subluxation atrophy or tremor that includes the 2 lower extremities and the left upper extremity.  Lymphadenopathy:    He has no cervical adenopathy.  Neurological: He is alert and oriented to person, place, and  time. He has normal reflexes. He displays normal reflexes. No cranial nerve deficit. He exhibits normal muscle tone. Coordination normal.  Skin: Skin is warm and dry. No rash noted. He is not diaphoretic. No erythema. No pallor.  Psychiatric: He has a normal mood and affect. His behavior is  normal. Judgment and thought content normal.     Assessment/Plan Comminuted greater tuberosity fracture right shoulder  Patient is rotator cuff repair and repair of greater tuberosity fracture  Consent was obtained the informed consent process patient understands he may have weakness stiffness he is at risk for infection.  Arther Abbott, MD 02/27/2016, 5:58 PM

## 2016-02-28 ENCOUNTER — Encounter (HOSPITAL_COMMUNITY): Payer: Self-pay

## 2016-02-28 ENCOUNTER — Ambulatory Visit (HOSPITAL_COMMUNITY): Payer: Medicare Other | Admitting: Anesthesiology

## 2016-02-28 ENCOUNTER — Encounter (HOSPITAL_COMMUNITY): Payer: Medicare Other

## 2016-02-28 ENCOUNTER — Ambulatory Visit (HOSPITAL_COMMUNITY)
Admission: RE | Admit: 2016-02-28 | Discharge: 2016-02-28 | Disposition: A | Discharge status: Home / Self Care | Payer: Medicare Other | Source: Ambulatory Visit | Attending: Orthopedic Surgery | Admitting: Orthopedic Surgery

## 2016-02-28 ENCOUNTER — Encounter (HOSPITAL_COMMUNITY)
Admission: RE | Disposition: A | Discharge status: Home / Self Care | Payer: Self-pay | Source: Ambulatory Visit | Attending: Orthopedic Surgery

## 2016-02-28 DIAGNOSIS — I10 Essential (primary) hypertension: Secondary | ICD-10-CM | POA: Insufficient documentation

## 2016-02-28 DIAGNOSIS — F319 Bipolar disorder, unspecified: Secondary | ICD-10-CM | POA: Diagnosis not present

## 2016-02-28 DIAGNOSIS — E119 Type 2 diabetes mellitus without complications: Secondary | ICD-10-CM | POA: Diagnosis not present

## 2016-02-28 DIAGNOSIS — G4730 Sleep apnea, unspecified: Secondary | ICD-10-CM | POA: Insufficient documentation

## 2016-02-28 DIAGNOSIS — E079 Disorder of thyroid, unspecified: Secondary | ICD-10-CM | POA: Insufficient documentation

## 2016-02-28 DIAGNOSIS — Y939 Activity, unspecified: Secondary | ICD-10-CM | POA: Insufficient documentation

## 2016-02-28 DIAGNOSIS — F419 Anxiety disorder, unspecified: Secondary | ICD-10-CM | POA: Insufficient documentation

## 2016-02-28 DIAGNOSIS — R0602 Shortness of breath: Secondary | ICD-10-CM | POA: Insufficient documentation

## 2016-02-28 DIAGNOSIS — S42251A Displaced fracture of greater tuberosity of right humerus, initial encounter for closed fracture: Secondary | ICD-10-CM | POA: Diagnosis not present

## 2016-02-28 DIAGNOSIS — Z6841 Body Mass Index (BMI) 40.0 and over, adult: Secondary | ICD-10-CM | POA: Insufficient documentation

## 2016-02-28 DIAGNOSIS — R51 Headache: Secondary | ICD-10-CM | POA: Diagnosis not present

## 2016-02-28 DIAGNOSIS — R631 Polydipsia: Secondary | ICD-10-CM | POA: Insufficient documentation

## 2016-02-28 DIAGNOSIS — W06XXXA Fall from bed, initial encounter: Secondary | ICD-10-CM | POA: Insufficient documentation

## 2016-02-28 DIAGNOSIS — Z888 Allergy status to other drugs, medicaments and biological substances status: Secondary | ICD-10-CM | POA: Insufficient documentation

## 2016-02-28 DIAGNOSIS — Z833 Family history of diabetes mellitus: Secondary | ICD-10-CM | POA: Insufficient documentation

## 2016-02-28 DIAGNOSIS — Z87891 Personal history of nicotine dependence: Secondary | ICD-10-CM | POA: Insufficient documentation

## 2016-02-28 DIAGNOSIS — F329 Major depressive disorder, single episode, unspecified: Secondary | ICD-10-CM | POA: Diagnosis not present

## 2016-02-28 DIAGNOSIS — K219 Gastro-esophageal reflux disease without esophagitis: Secondary | ICD-10-CM | POA: Insufficient documentation

## 2016-02-28 HISTORY — PX: ORIF SHOULDER FRACTURE: SHX5035

## 2016-02-28 LAB — GLUCOSE, CAPILLARY
Glucose-Capillary: 196 mg/dL — ABNORMAL HIGH (ref 65–99)
Glucose-Capillary: 287 mg/dL — ABNORMAL HIGH (ref 65–99)

## 2016-02-28 SURGERY — OPEN REDUCTION INTERNAL FIXATION (ORIF) SHOULDER FRACTURE
Anesthesia: General | Laterality: Right

## 2016-02-28 MED ORDER — SUCCINYLCHOLINE CHLORIDE 20 MG/ML IJ SOLN
INTRAMUSCULAR | Status: AC
  Filled 2016-02-28: qty 1

## 2016-02-28 MED ORDER — BUPIVACAINE-EPINEPHRINE (PF) 0.5% -1:200000 IJ SOLN
INTRAMUSCULAR | Status: AC
  Filled 2016-02-28: qty 60

## 2016-02-28 MED ORDER — ROCURONIUM BROMIDE 50 MG/5ML IV SOLN
INTRAVENOUS | Status: AC
  Filled 2016-02-28: qty 1

## 2016-02-28 MED ORDER — GLYCOPYRROLATE 0.2 MG/ML IJ SOLN
0.2000 mg | Freq: Once | INTRAMUSCULAR | Status: AC
  Administered 2016-02-28: 0.2 mg via INTRAVENOUS

## 2016-02-28 MED ORDER — BUPIVACAINE-EPINEPHRINE (PF) 0.5% -1:200000 IJ SOLN
INTRAMUSCULAR | Status: DC | PRN
  Administered 2016-02-28: 55 mL

## 2016-02-28 MED ORDER — FENTANYL CITRATE (PF) 100 MCG/2ML IJ SOLN: 25.0000 ug | INTRAMUSCULAR | Status: DC | PRN

## 2016-02-28 MED ORDER — NEOSTIGMINE METHYLSULFATE 10 MG/10ML IV SOLN
INTRAVENOUS | Status: DC | PRN
  Administered 2016-02-28: 4 mg via INTRAVENOUS

## 2016-02-28 MED ORDER — CEFAZOLIN SODIUM-DEXTROSE 2-4 GM/100ML-% IV SOLN
2.0000 g | INTRAVENOUS | Status: AC
  Administered 2016-02-28: 2 g via INTRAVENOUS
  Filled 2016-02-28: qty 100

## 2016-02-28 MED ORDER — GLYCOPYRROLATE 0.2 MG/ML IJ SOLN
INTRAMUSCULAR | Status: AC
  Filled 2016-02-28: qty 3

## 2016-02-28 MED ORDER — MIDAZOLAM HCL 2 MG/2ML IJ SOLN
INTRAMUSCULAR | Status: AC
  Filled 2016-02-28: qty 2

## 2016-02-28 MED ORDER — EPHEDRINE SULFATE 50 MG/ML IJ SOLN
INTRAMUSCULAR | Status: AC
  Filled 2016-02-28: qty 1

## 2016-02-28 MED ORDER — FENTANYL CITRATE (PF) 250 MCG/5ML IJ SOLN
INTRAMUSCULAR | Status: AC
  Filled 2016-02-28: qty 5

## 2016-02-28 MED ORDER — ONDANSETRON HCL 4 MG/2ML IJ SOLN
4.0000 mg | Freq: Once | INTRAMUSCULAR | Status: AC
  Administered 2016-02-28: 4 mg via INTRAVENOUS
  Filled 2016-02-28: qty 2

## 2016-02-28 MED ORDER — FENTANYL CITRATE (PF) 100 MCG/2ML IJ SOLN
INTRAMUSCULAR | Status: AC
  Filled 2016-02-28: qty 2

## 2016-02-28 MED ORDER — ONDANSETRON HCL 4 MG/2ML IJ SOLN: 4.0000 mg | Freq: Once | INTRAMUSCULAR | Status: DC | PRN

## 2016-02-28 MED ORDER — SUFENTANIL CITRATE 50 MCG/ML IV SOLN
INTRAVENOUS | Status: DC | PRN
  Administered 2016-02-28: 10 ug via INTRAVENOUS

## 2016-02-28 MED ORDER — LACTATED RINGERS IV SOLN
INTRAVENOUS | Status: DC
  Administered 2016-02-28: 11:00:00 via INTRAVENOUS
  Administered 2016-02-28: 1000 mL via INTRAVENOUS

## 2016-02-28 MED ORDER — SODIUM CHLORIDE 0.9 % IJ SOLN
INTRAMUSCULAR | Status: AC
  Filled 2016-02-28: qty 10

## 2016-02-28 MED ORDER — SODIUM CHLORIDE 0.9 % IR SOLN
Status: DC | PRN
  Administered 2016-02-28: 1000 mL

## 2016-02-28 MED ORDER — MIDAZOLAM HCL 2 MG/2ML IJ SOLN
1.0000 mg | INTRAMUSCULAR | Status: DC | PRN
  Administered 2016-02-28 (×3): 2 mg via INTRAVENOUS
  Filled 2016-02-28 (×2): qty 2

## 2016-02-28 MED ORDER — DEXAMETHASONE SODIUM PHOSPHATE 4 MG/ML IJ SOLN
4.0000 mg | Freq: Once | INTRAMUSCULAR | Status: AC
  Administered 2016-02-28: 4 mg via INTRAVENOUS
  Filled 2016-02-28: qty 1

## 2016-02-28 MED ORDER — CEFAZOLIN IN D5W 1 GM/50ML IV SOLN
INTRAVENOUS | Status: AC
  Filled 2016-02-28: qty 50

## 2016-02-28 MED ORDER — IBUPROFEN 800 MG PO TABS: 800.0000 mg | ORAL_TABLET | Freq: Three times a day (TID) | ORAL | 0 refills | Status: DC | PRN

## 2016-02-28 MED ORDER — METHOCARBAMOL 1000 MG/10ML IJ SOLN
500.0000 mg | Freq: Once | INTRAMUSCULAR | Status: AC
  Administered 2016-02-28: 500 mg via INTRAVENOUS
  Filled 2016-02-28: qty 5

## 2016-02-28 MED ORDER — CEFAZOLIN IN D5W 1 GM/50ML IV SOLN
INTRAVENOUS | Status: DC | PRN
  Administered 2016-02-28: 1 g via INTRAVENOUS

## 2016-02-28 MED ORDER — SUFENTANIL CITRATE 50 MCG/ML IV SOLN
INTRAVENOUS | Status: AC
  Filled 2016-02-28: qty 1

## 2016-02-28 MED ORDER — ONDANSETRON HCL 4 MG/2ML IJ SOLN
4.0000 mg | Freq: Once | INTRAMUSCULAR | Status: AC
  Administered 2016-02-28: 4 mg via INTRAVENOUS

## 2016-02-28 MED ORDER — PROPOFOL 10 MG/ML IV BOLUS
INTRAVENOUS | Status: AC
  Filled 2016-02-28: qty 40

## 2016-02-28 MED ORDER — GLYCOPYRROLATE 0.2 MG/ML IJ SOLN
INTRAMUSCULAR | Status: AC
  Filled 2016-02-28: qty 1

## 2016-02-28 MED ORDER — MIDAZOLAM HCL 5 MG/5ML IJ SOLN
INTRAMUSCULAR | Status: DC | PRN
  Administered 2016-02-28: 2 mg via INTRAVENOUS

## 2016-02-28 MED ORDER — ONDANSETRON HCL 4 MG/2ML IJ SOLN
INTRAMUSCULAR | Status: AC
  Filled 2016-02-28: qty 2

## 2016-02-28 MED ORDER — OXYCODONE HCL 5 MG PO TABS
5.0000 mg | ORAL_TABLET | Freq: Once | ORAL | Status: AC
  Administered 2016-02-28: 5 mg via ORAL
  Filled 2016-02-28: qty 1

## 2016-02-28 MED ORDER — HYDROCODONE-ACETAMINOPHEN 10-325 MG PO TABS: 1.0000 | ORAL_TABLET | ORAL | 0 refills | Status: DC | PRN

## 2016-02-28 MED ORDER — FENTANYL CITRATE (PF) 100 MCG/2ML IJ SOLN
INTRAMUSCULAR | Status: DC | PRN
  Administered 2016-02-28: 100 ug via INTRAVENOUS
  Administered 2016-02-28 (×3): 50 ug via INTRAVENOUS
  Administered 2016-02-28: 100 ug via INTRAVENOUS
  Administered 2016-02-28 (×3): 50 ug via INTRAVENOUS

## 2016-02-28 MED ORDER — CHLORHEXIDINE GLUCONATE 4 % EX LIQD: 60.0000 mL | Freq: Once | CUTANEOUS | Status: DC

## 2016-02-28 MED ORDER — ROCURONIUM BROMIDE 100 MG/10ML IV SOLN
INTRAVENOUS | Status: DC | PRN
  Administered 2016-02-28: 20 mg via INTRAVENOUS
  Administered 2016-02-28 (×2): 10 mg via INTRAVENOUS
  Administered 2016-02-28: 20 mg via INTRAVENOUS
  Administered 2016-02-28: 10 mg via INTRAVENOUS

## 2016-02-28 MED ORDER — PROPOFOL 10 MG/ML IV BOLUS
INTRAVENOUS | Status: AC
  Filled 2016-02-28: qty 20

## 2016-02-28 MED ORDER — GLYCOPYRROLATE 0.2 MG/ML IJ SOLN
INTRAMUSCULAR | Status: DC | PRN
  Administered 2016-02-28: 0.6 mg via INTRAVENOUS

## 2016-02-28 MED ORDER — LIDOCAINE HCL (PF) 1 % IJ SOLN
INTRAMUSCULAR | Status: AC
  Filled 2016-02-28: qty 5

## 2016-02-28 MED ORDER — BUPIVACAINE-EPINEPHRINE (PF) 0.5% -1:200000 IJ SOLN
INTRAMUSCULAR | Status: DC | PRN
  Administered 2016-02-28: 5 mL

## 2016-02-28 MED ORDER — CELECOXIB 400 MG PO CAPS
400.0000 mg | ORAL_CAPSULE | Freq: Once | ORAL | Status: AC
  Administered 2016-02-28: 400 mg via ORAL
  Filled 2016-02-28: qty 1

## 2016-02-28 MED ORDER — PREGABALIN 50 MG PO CAPS
50.0000 mg | ORAL_CAPSULE | Freq: Once | ORAL | Status: AC
  Administered 2016-02-28: 50 mg via ORAL

## 2016-02-28 MED ORDER — PROPOFOL 10 MG/ML IV BOLUS
INTRAVENOUS | Status: DC | PRN
  Administered 2016-02-28: 150 mg via INTRAVENOUS
  Administered 2016-02-28: 70 mg via INTRAVENOUS
  Administered 2016-02-28: 50 mg via INTRAVENOUS
  Administered 2016-02-28: 30 mg via INTRAVENOUS

## 2016-02-28 MED ORDER — SUCCINYLCHOLINE CHLORIDE 20 MG/ML IJ SOLN
INTRAMUSCULAR | Status: DC | PRN
  Administered 2016-02-28: 160 mg via INTRAVENOUS

## 2016-02-28 MED ORDER — LIDOCAINE HCL (CARDIAC) 20 MG/ML IV SOLN
INTRAVENOUS | Status: DC | PRN
  Administered 2016-02-28: 50 mg via INTRAVENOUS

## 2016-02-28 SURGICAL SUPPLY — 48 items
ANCH SUT 2 CRKSCW 15.5X6.5 (Anchor) ×2 IMPLANT
ANCH SUT PUSHLCK 24X4.5 STRL (Orthopedic Implant) ×1 IMPLANT
ANCHOR CORKSCREW 6.5 FIBERWIRE (Anchor) ×4 IMPLANT
BIT DRILL 2.0X128 (BIT) ×1 IMPLANT
BIT DRILL 2.0X128MM (BIT) ×1
BLADE HEX COATED 2.75 (ELECTRODE) ×3 IMPLANT
CHLORAPREP W/TINT 26ML (MISCELLANEOUS) ×3 IMPLANT
CLOTH BEACON ORANGE TIMEOUT ST (SAFETY) ×3 IMPLANT
COVER LIGHT HANDLE STERIS (MISCELLANEOUS) ×8 IMPLANT
DECANTER SPIKE VIAL GLASS SM (MISCELLANEOUS) ×4 IMPLANT
DRAPE PROXIMA HALF (DRAPES) ×3 IMPLANT
DRESSING ALLEVYN BORDER 5X5 (GAUZE/BANDAGES/DRESSINGS) ×3 IMPLANT
ELECT REM PT RETURN 9FT ADLT (ELECTROSURGICAL) ×3
ELECTRODE REM PT RTRN 9FT ADLT (ELECTROSURGICAL) ×1 IMPLANT
GLOVE BIO SURGEON STRL SZ7 (GLOVE) ×4 IMPLANT
GLOVE BIOGEL PI IND STRL 7.0 (GLOVE) ×1 IMPLANT
GLOVE BIOGEL PI INDICATOR 7.0 (GLOVE) ×2
GLOVE SKINSENSE NS SZ8.0 LF (GLOVE) ×4
GLOVE SKINSENSE STRL SZ8.0 LF (GLOVE) IMPLANT
GLOVE SS N UNI LF 8.5 STRL (GLOVE) ×5 IMPLANT
GOWN STRL REUS W/TWL LRG LVL3 (GOWN DISPOSABLE) ×7 IMPLANT
GOWN STRL REUS W/TWL XL LVL3 (GOWN DISPOSABLE) ×3 IMPLANT
INST SET MINOR BONE (KITS) ×3 IMPLANT
KIT BLADEGUARD II DBL (SET/KITS/TRAYS/PACK) ×2 IMPLANT
KIT ROOM TURNOVER APOR (KITS) ×3 IMPLANT
MANIFOLD NEPTUNE II (INSTRUMENTS) ×3 IMPLANT
MARKER SKIN DUAL TIP RULER LAB (MISCELLANEOUS) ×3 IMPLANT
NDL HYPO 21X1.5 SAFETY (NEEDLE) ×1 IMPLANT
NDL MA TROC 1/2 (NEEDLE) IMPLANT
NEEDLE HYPO 21X1.5 SAFETY (NEEDLE) ×3 IMPLANT
NEEDLE MA TROC 1/2 (NEEDLE) ×3 IMPLANT
NS IRRIG 1000ML POUR BTL (IV SOLUTION) ×3 IMPLANT
PACK BASIC III (CUSTOM PROCEDURE TRAY) ×3
PACK SRG BSC III STRL LF ECLPS (CUSTOM PROCEDURE TRAY) ×1 IMPLANT
PACK TOTAL JOINT (CUSTOM PROCEDURE TRAY) ×2 IMPLANT
PAD ARMBOARD 7.5X6 YLW CONV (MISCELLANEOUS) ×3 IMPLANT
PENCIL HANDSWITCHING (ELECTRODE) ×3 IMPLANT
PUSHLOCK PEEK 4.5X24 (Orthopedic Implant) ×2 IMPLANT
RASP SM TEAR CROSS CUT (RASP) IMPLANT
SET BASIN LINEN APH (SET/KITS/TRAYS/PACK) ×3 IMPLANT
SLING ARM XL FOAM STRAP (SOFTGOODS) ×2 IMPLANT
STAPLER VISISTAT 35W (STAPLE) ×2 IMPLANT
SUT ETHIBOND 5 LR DA (SUTURE) ×2 IMPLANT
SUT ETHIBOND NAB OS 4 #2 30IN (SUTURE) ×3 IMPLANT
SUT MON AB 0 CT1 (SUTURE) ×3 IMPLANT
SUT MON AB 2-0 CT1 36 (SUTURE) ×3 IMPLANT
SYR 30ML LL (SYRINGE) ×3 IMPLANT
SYR BULB IRRIGATION 50ML (SYRINGE) ×4 IMPLANT

## 2016-02-28 NOTE — Interval H&P Note (Signed)
History and Physical Interval Note:  02/28/2016 10:30 AM  Kyle Collins  has presented today for surgery, with the diagnosis of FRACTURE RIGHT GREATER TUBEROSITY  The various methods of treatment have been discussed with the patient and family. After consideration of risks, benefits and other options for treatment, the patient has consented to  Procedure(s): OPEN REDUCTION INTERNAL FIXATION (ORIF) SHOULDER FRACTURE (Right) as a surgical intervention .  The patient's history has been reviewed, patient examined, no change in status, stable for surgery.  I have reviewed the patient's chart and labs.  Questions were answered to the patient's satisfaction.     Arther Abbott

## 2016-02-28 NOTE — Anesthesia Procedure Notes (Signed)
Procedure Name: Intubation Date/Time: 02/28/2016 11:06 AM Performed by: Andree Elk, Clayvon Parlett A Pre-anesthesia Checklist: Patient identified, Patient being monitored, Timeout performed, Emergency Drugs available and Suction available Patient Re-evaluated:Patient Re-evaluated prior to inductionOxygen Delivery Method: Circle System Utilized Preoxygenation: Pre-oxygenation with 100% oxygen Intubation Type: IV induction, Cricoid Pressure applied and Rapid sequence Laryngoscope Size: Miller and 3 Grade View: Grade I Tube type: Oral Tube size: 7.0 mm Number of attempts: 1 Airway Equipment and Method: Stylet Placement Confirmation: ETT inserted through vocal cords under direct vision,  positive ETCO2 and breath sounds checked- equal and bilateral Secured at: 22 cm Tube secured with: Tape Dental Injury: Teeth and Oropharynx as per pre-operative assessment

## 2016-02-28 NOTE — Progress Notes (Signed)
Reported abnormal blood pressure readings to Dr. Patsey Berthold. No further orders given.

## 2016-02-28 NOTE — Anesthesia Postprocedure Evaluation (Signed)
Anesthesia Post Note  Patient: Kyle Collins  Procedure(s) Performed: Procedure(s) (LRB): OPEN REDUCTION INTERNAL FIXATION (ORIF) SHOULDER FRACTURE (Right)  Patient location during evaluation: PACU Anesthesia Type: General Level of consciousness: awake and alert Pain management: pain level not controlled Vital Signs Assessment: post-procedure vital signs reviewed and stable Respiratory status: spontaneous breathing Cardiovascular status: stable Postop Assessment: no signs of nausea or vomiting Anesthetic complications: no    Last Vitals:  Vitals:   02/28/16 1345 02/28/16 1350  BP: (!) 187/113   Pulse: 94   Resp: 14 14  Temp:      Last Pain:  Vitals:   02/28/16 1000  TempSrc:   PainSc: 0-No pain                 [name] A

## 2016-02-28 NOTE — Anesthesia Preprocedure Evaluation (Signed)
Anesthesia Evaluation  Patient identified by MRN, date of birth, ID band Patient awake    Reviewed: Allergy & Precautions, Patient's Chart, lab work & pertinent test results  Airway Mallampati: II  TM Distance: >3 FB Neck ROM: Full    Dental  (+) Poor Dentition, Missing, Chipped,    Pulmonary sleep apnea and Continuous Positive Airway Pressure Ventilation , Current Smoker, former smoker,     + decreased breath sounds      Cardiovascular hypertension, Pt. on medications  Rhythm:Regular Rate:Normal     Neuro/Psych  Headaches, Anxiety Depression Bipolar Disorder    GI/Hepatic GERD  Medicated and Poorly Controlled,  Endo/Other  diabetes, Poorly Controlled, Type 2Hypothyroidism Morbid obesity  Renal/GU      Musculoskeletal   Abdominal (+) + obese,   Peds  Hematology   Anesthesia Other Findings   Reproductive/Obstetrics                             Anesthesia Physical Anesthesia Plan  ASA: III  Anesthesia Plan: General   Post-op Pain Management:    Induction: Intravenous, Rapid sequence and Cricoid pressure planned  Airway Management Planned: Oral ETT  Additional Equipment:   Intra-op Plan:   Post-operative Plan: Extubation in OR  Informed Consent: I have reviewed the patients History and Physical, chart, labs and discussed the procedure including the risks, benefits and alternatives for the proposed anesthesia with the patient or authorized representative who has indicated his/her understanding and acceptance.     Plan Discussed with:   Anesthesia Plan Comments:         Anesthesia Quick Evaluation

## 2016-02-28 NOTE — Transfer of Care (Signed)
Immediate Anesthesia Transfer of Care Note  Patient: Kyle Collins  Procedure(s) Performed: Procedure(s): OPEN REDUCTION INTERNAL FIXATION (ORIF) SHOULDER FRACTURE (Right)  Patient Location: PACU  Anesthesia Type:General  Level of Consciousness: awake, alert , oriented and patient cooperative  Airway & Oxygen Therapy: Patient Spontanous Breathing and Patient connected to face mask oxygen  Post-op Assessment: Report given to RN and Post -op Vital signs reviewed and stable  Post vital signs: Reviewed and stable  Last Vitals:  Vitals:   02/28/16 0955 02/28/16 1005  BP: (!) 165/103 (!) 164/102  Pulse: 75 76  Resp: 14 12  Temp:      Last Pain:  Vitals:   02/28/16 1000  TempSrc:   PainSc: 0-No pain      Patients Stated Pain Goal: 8 ([id number])  Complications: No apparent anesthesia complications

## 2016-02-28 NOTE — Op Note (Signed)
02/28/2016  12:52 PM  PATIENT:  Kyle Collins  30 y.o. male  PRE-OPERATIVE DIAGNOSIS:  FRACTURE RIGHT GREATER TUBEROSITY  POST-OPERATIVE DIAGNOSIS:  FRACTURE RIGHT GREATER TUBEROSITY  PROCEDURE:  Procedure(s): OPEN REDUCTION INTERNAL FIXATION (ORIF) SHOULDER FRACTURE (Right)   Implants 2-6 0.5 mm corkscrew suture anchors and one 4.5 mm push lock  Operative findings displaced greater tuberosity fracture with cuff attached externally rotated.  SURGEON:   moderate internal rotation contracture with external rotation only 30  Assisted by Simonne Maffucci  Gen. anesthesia   Surgeon(s) and Role:    * Carole Civil, MD - Primary   EBL:  Total I/O In: 1300 [I.V.:1300] Out: 50 [Blood:50]   LOCAL MEDICATIONS USED:  MARCAINE   , Amount: 60 ml and OTHER epi  SPECIMEN:  No Specimen  DISPOSITION OF SPECIMEN:  N/A  COUNTS:  YES  TOURNIQUET:  * No tourniquets in log *  DICTATION: .Dragon Dictation  PLAN OF CARE: Discharge to home after PACU  PATIENT DISPOSITION:  PACU - hemodynamically stable.   Delay start of Pharmacological VTE agent (>24hrs) due to surgical blood loss or risk of bleeding: yes  Details of surgery. This patient was identified in the preop area the right shoulder was confirmed as a surgical site and marked. Chart review was completed  Appropriate paperwork was signed  Patient was taken to the operating room given 3 g of Ancef based on his weight of 349 pounds  He is placed in the modified beachchair position after appropriate intubation which went successfully without event  Sterile prep and drape was performed  Timeout was completed  Standard open rotator cuff incision was made over the anterolateral acromion extended distally between the anterior and middle deltoid. Subtenons tissue was divided down to the deltoid fascia. Deltoid fascia was split up to the acromion but was not detached. Blunt dissection was carried out to release adhesions in the  subacromial space a bursectomy was performed. The fracture was noted and the bone of the greater tuberosity was externally rotated. By externally rotating the arm the reduction was easily obtained. We placed a #5 suture in the rotator cuff and greater tuberosity to gain control and then placed 2 number 6. 5 suture anchors from [employer]. We used a corkscrew suture anchors. We did place drill holes in the greater tuberosity to stimulate bleeding and we debrided it of soft tissue.  We passed the sutures 4 through the tuberosity and cuff and tied it down with the arm externally rotated and abducted. We then placed 4 of the suture limbs through a push lock through the proximal humerus. This gave excellent repair and stabilization of the fracture. Passive range of motion was checked and there was no tension on the repair. Suture ends were cut  The wound was irrigated injected with 60 mL of Marcaine. The deltoid split was closed with 0 Monocryl suture the subcutaneous taste tissue was closed with 2-0 Monocryl suture and the skin was closed with staples  Sterile dressing was applied  Sling was applied  Patient was extubated taken recovery room stable condition  Patient will be in a sling for the next 4 weeks and then we can start passive range of motion between weeks 5 and 8 and then progressive resistance exercises between weeks 9 and 12

## 2016-02-28 NOTE — Brief Op Note (Signed)
02/28/2016  12:52 PM  PATIENT:  Kyle Collins  30 y.o. male  PRE-OPERATIVE DIAGNOSIS:  FRACTURE RIGHT GREATER TUBEROSITY  POST-OPERATIVE DIAGNOSIS:  FRACTURE RIGHT GREATER TUBEROSITY  PROCEDURE:  Procedure(s): OPEN REDUCTION INTERNAL FIXATION (ORIF) SHOULDER FRACTURE (Right)   Implants 2-6 0.5 mm corkscrew suture anchors and one 4.5 mm push lock  Operative findings displaced greater tuberosity fracture with cuff attached externally rotated.  SURGEON:   moderate internal rotation contracture with external rotation only 30  Assisted by Simonne Maffucci  Gen. anesthesia   Surgeon(s) and Role:    * Carole Civil, MD - Primary   EBL:  Total I/O In: 1300 [I.V.:1300] Out: 50 [Blood:50]   LOCAL MEDICATIONS USED:  MARCAINE   , Amount: 60 ml and OTHER epi  SPECIMEN:  No Specimen  DISPOSITION OF SPECIMEN:  N/A  COUNTS:  YES  TOURNIQUET:  * No tourniquets in log *  DICTATION: .Dragon Dictation  PLAN OF CARE: Discharge to home after PACU  PATIENT DISPOSITION:  PACU - hemodynamically stable.   Delay start of Pharmacological VTE agent (>24hrs) due to surgical blood loss or risk of bleeding: yes

## 2016-02-28 NOTE — Discharge Instructions (Signed)
Incision Care An incision is when a surgeon cuts into your body. After surgery, the incision needs to be cared for properly to prevent infection.  HOW TO CARE FOR YOUR INCISION  Take medicines only as directed by your health care provider.  There are many different ways to close and cover an incision, including stitches, skin glue, and adhesive strips. Follow your health care provider's instructions on:  Incision care.  Bandage (dressing) changes and removal.  Incision closure removal.  Do not take baths, swim, or use a hot tub until your health care provider approves. You may shower as directed by your health care provider.  Resume your normal diet and activities as directed.  Use anti-itch medicine (such as an antihistamine) as directed by your health care provider. The incision may itch while it is healing. Do not pick or scratch at the incision.  Drink enough fluid to keep your urine clear or pale yellow. SEEK MEDICAL CARE IF:   You have drainage, redness, swelling, or pain at your incision site.  You have muscle aches, chills, or a general ill feeling.  You notice a bad smell coming from the incision or dressing.  Your incision edges separate after the sutures, staples, or skin adhesive strips have been removed.  You have persistent nausea or vomiting.  You have a fever.  You are dizzy. SEEK IMMEDIATE MEDICAL CARE IF:   You have a rash.  You faint.  You have difficulty breathing. MAKE SURE YOU:   Understand these instructions.  Will watch your condition.  Will get help right away if you are not doing well or get worse.   This information is not intended to replace advice given to you by your health care provider. Make sure you discuss any questions you have with your health care provider.   Document Released: 01/31/2005 Document Revised: 08/04/2014 Document Reviewed: 09/07/2013 Elsevier Interactive Patient Education 2016 [employer]  PATIENT  INSTRUCTIONS POST-ANESTHESIA  IMMEDIATELY FOLLOWING SURGERY:  Do not drive or operate machinery for the first twenty four hours after surgery.  Do not make any important decisions for twenty four hours after surgery or while taking narcotic pain medications or sedatives.  If you develop intractable nausea and vomiting or a severe headache please notify your doctor immediately.  FOLLOW-UP:  Please make an appointment with your surgeon as instructed. You do not need to follow up with anesthesia unless specifically instructed to do so.  WOUND CARE INSTRUCTIONS (if applicable):  Keep a dry clean dressing on the anesthesia/puncture wound site if there is drainage.  Once the wound has quit draining you may leave it open to air.  Generally you should leave the bandage intact for twenty four hours unless there is drainage.  If the epidural site drains for more than 36-48 hours please call the anesthesia department.  QUESTIONS?:  Please feel free to call your physician or the hospital operator if you have any questions, and they will be happy to assist you.

## 2016-02-28 NOTE — H&P (Signed)
Kyle Collins is an 30 y.o. male.   Chief Complaint: Pain right shoulder HPI: 30 yo male with sleep apnea?CPAP, bipolar disorder, DM, HTN, fell getting out of bed back on May 9 and sustained a fracture dilsocation of the shoulder. The ER tried closed reduction with 160 mg of propofol and it was not successful He co numbness in the C5 deltoid area and AIN. He went to the operating room for closed reduction under anesthesia. He had a stable closed reduction. He was treated with appropriate sling and immobilization. The plan was to come back and repairs greater tuberosity/rotator cuff fracture/injury but he was noncompliant with glucose management. Therefore he was sent endocrinologist to try to gain control of his sugar. He seems to have a reasonably under control and presents now for treatment of the greater tuberosity/rotator cuff tear.    Past Medical History:  Diagnosis Date  . Anxiety   . Bipolar disorder ([place])   . Depression   . Diabetes mellitus   . GERD (gastroesophageal reflux disease)   . Headache   . Hypertension   . Sleep apnea   . Thyroid disease     Past Surgical History:  Procedure Laterality Date  . HERNIA REPAIR     scrotum  . SHOULDER CLOSED REDUCTION Right 12/04/2015   Procedure: CLOSED REDUCTION SHOULDER;  Surgeon: Carole Civil, MD;  Location: [facility];  Service: Orthopedics;  Laterality: Right;  . TOOTH EXTRACTION  spring 2016   top left     Family History  Problem Relation Age of Onset  . Diabetes Mother   . Diabetes Father    Social History:  reports that he quit smoking about 7 years ago. His smoking use included E-cigarettes. He has a 2.50 pack-year smoking history. He has never used smokeless tobacco. He reports that he drinks alcohol. He reports that he does not use drugs.  Allergies:  Allergies  Allergen Reactions  . Methylphenidate Derivatives Other (See Comments)    Patient states it made him "act like a zombie" when given as a child  . Ritalin  [Methylphenidate Hcl]     No prescriptions prior to admission.    No results found for this or any previous visit (from the past 48 hour(s)). No results found.  Review of Systems  Constitutional: Negative for chills, fever and weight loss.  HENT: Negative for nosebleeds.   Eyes: Negative for blurred vision.  Respiratory: Positive for shortness of breath.   Cardiovascular: Negative for chest pain.  Gastrointestinal: Negative for vomiting.  Genitourinary: Negative for frequency.  Musculoskeletal: Positive for joint pain.  Skin: Negative for rash.  Neurological: Positive for sensory change.  Endo/Heme/Allergies: Positive for polydipsia. Negative for environmental allergies. Does not bruise/bleed easily.  Psychiatric/Behavioral: Positive for depression.    There were no vitals taken for this visit. BP (!) 164/102   Pulse 76   Temp 98.1 F (36.7 C) (Oral)   Resp 12   Ht 6\' 1"  (1.854 m)   Wt (!) 349 lb (158.3 kg)   SpO2 97%   BMI 46.04 kg/m   Physical Exam  Gen. appearance the patient is somewhat unkept normal development. Hygiene is poor grooming poor He is awake alert and oriented 3 His mood and affect is flat His gait shows no evidence of a limp  Vascular exam shows normal pulses and no edema in any of the 4 extremities  Sensation shows decreased sensation over the right deltoid and also in the forearm including the anterior  interosseous nerve The lower extremities and left upper extremity have normal sensation Balance seems to be normal, coordination lower extremities normal upper extremities deferred no comparison due to injury Lymph nodes show no axillary involvement on either the right or left axilla  Right and left lower extremity Inspection we see no abnormalities normal alignment Range of motion we find no contractures and no tremors Strength we find no weakness and no atrophy Stability of joints are reduced without subluxation  Left upper extremity  inspection we see normal alignment Range of motion is normal and full Strength tests were grade 5 over 5 for the rotator cuff flexors of the elbow extensors of the elbow and wrist and hand musculature Stability of the shoulder elbow wrist normal  Right upper extremity inspection we find the arm and I immobilization device with decreased range of motion in all planes no atrophy is noted. The shoulder stability was deferred because of the injury that can be tested again under anesthesia elbow wrist and hand stability normal  BMP Latest Ref Rng & Units 02/25/2016 12/04/2015 10/09/2015  Glucose 65 - 99 mg/dL 276(H) 429(H) 261(H)  BUN 6 - 20 mg/dL 16 15 13   Creatinine 0.61 - 1.24 mg/dL 0.64 0.80 0.69  Sodium 135 - 145 mmol/L 132(L) 138 136  Potassium 3.5 - 5.1 mmol/L 4.2 4.3 4.4  Chloride 101 - 111 mmol/L 101 100(L) 98  CO2 22 - 32 mmol/L 25 26 26   Calcium 8.9 - 10.3 mg/dL 9.2 9.3 10.0   Glucose today 196  CBC Latest Ref Rng & Units 02/25/2016 12/04/2015 06/07/2015  WBC 4.0 - 10.5 K/uL 6.7 13.3(H) 7.3  Hemoglobin 13.0 - 17.0 g/dL 15.0 16.0 15.9  Hematocrit 39.0 - 52.0 % 45.0 46.1 46.5  Platelets 150 - 400 K/uL 252 260 222     Assessment/Plan Right shoulder: Comminuted fracture greater tuberosity indicating rotator cuff injury status post dislocation close reduction  Open treatment internal fixation and repair of greater tuberosity/rotator cuff right shoulder  Arther Abbott, MD 02/28/2016, 7:28 AM

## 2016-02-29 ENCOUNTER — Other Ambulatory Visit: Payer: Self-pay | Admitting: "Endocrinology

## 2016-03-04 ENCOUNTER — Ambulatory Visit: Payer: Medicare Other | Admitting: Orthopedic Surgery

## 2016-03-04 ENCOUNTER — Encounter (HOSPITAL_COMMUNITY): Payer: Self-pay | Admitting: Orthopedic Surgery

## 2016-03-04 VITALS — BP 142/98 | Ht 73.0 in | Wt 347.0 lb

## 2016-03-04 DIAGNOSIS — Z4789 Encounter for other orthopedic aftercare: Secondary | ICD-10-CM

## 2016-03-04 DIAGNOSIS — S4291XD Fracture of right shoulder girdle, part unspecified, subsequent encounter for fracture with routine healing: Secondary | ICD-10-CM

## 2016-03-04 NOTE — Patient Instructions (Signed)
Okay to shower. Do not lift commonly from your body.  Keep wound clean dry except for showering. After shower pat the wound dry covered with a sterile bandage.  Keep sling on except for changing close

## 2016-03-04 NOTE — Progress Notes (Addendum)
Patient ID: Kyle Collins, male   DOB: 06-18-1986, 30 y.o.   MRN: [id number]  Follow up visit  Chief Complaint  Patient presents with  . Follow-up    post op 1, ORIF Rt shoudler, DOS 02/28/16    BP (!) 142/98   Ht 6\' 1"  (1.854 m)   Wt (!) 347 lb (157.4 kg)   BMI 45.78 kg/m   Encounter Diagnoses  Name Primary?  . Shoulder fracture, right, with routine healing, subsequent encounter   . Surgical aftercare, musculoskeletal system Yes   Patient was a very minimal discomfort. Does not need any new pain medication  Given instructions on sling wear and use of arm.  Wound is clean dry and intact. Periscapular and. Acromial region and deltoid area normal sensation. Ecchymosis of the skin normal.  Follow-up on the 17th for staples,  and x-rays internal/external rotation right shoulder  The postop plan will be weeks 0 through 4 sling  Weeks 5 through 8 passive range of motion  Weeks 9 through 12 progressive resistance exercises  Weeks 12-24 active range of motion and advance progressive resistance exercises 10:30 AM Arther Abbott, MD 03/04/2016

## 2016-03-13 ENCOUNTER — Encounter: Payer: Self-pay | Admitting: Orthopedic Surgery

## 2016-03-13 ENCOUNTER — Ambulatory Visit (INDEPENDENT_AMBULATORY_CARE_PROVIDER_SITE_OTHER): Payer: Medicare Other

## 2016-03-13 ENCOUNTER — Ambulatory Visit (INDEPENDENT_AMBULATORY_CARE_PROVIDER_SITE_OTHER): Payer: Medicare Other | Admitting: Orthopedic Surgery

## 2016-03-13 VITALS — BP 101/58 | HR 48 | Ht 73.0 in | Wt 347.0 lb

## 2016-03-13 DIAGNOSIS — S4291XD Fracture of right shoulder girdle, part unspecified, subsequent encounter for fracture with routine healing: Secondary | ICD-10-CM

## 2016-03-13 DIAGNOSIS — Z4789 Encounter for other orthopedic aftercare: Secondary | ICD-10-CM

## 2016-03-13 NOTE — Progress Notes (Signed)
Patient ID: Kyle Collins, male   DOB: 01-02-1986, 30 y.o.   MRN: [id number]  Follow up visit  Chief Complaint  Patient presents with  . Follow-up    POST OP RT SHOULDER ORIF, DOS 02/21/16    There were no vitals taken for this visit.  Encounter Diagnoses  Name Primary?  . Surgical aftercare, musculoskeletal system Yes  . Shoulder fracture, right, with routine healing, subsequent encounter    XRAYS: STABLE FIXATION WITH SOME FRAGMENTS IN THE SUBACROMIAL SPACE  WOUND: CLEAN  WEAR SLING AS NEEDED   START OT    3:09 PM Arther Abbott, MD 03/13/2016

## 2016-03-13 NOTE — Patient Instructions (Signed)
START OT   WEAR SLING AS NEEDED   NO HEAVY LIFTING

## 2016-03-13 NOTE — Addendum Note (Signed)
Addended by: Baldomero Lamy B on: 03/13/2016 03:47 PM   Modules accepted: Orders

## 2016-03-18 ENCOUNTER — Encounter (HOSPITAL_COMMUNITY): Payer: Self-pay | Admitting: Occupational Therapy

## 2016-03-18 ENCOUNTER — Ambulatory Visit (HOSPITAL_COMMUNITY): Payer: Medicare Other | Attending: Orthopedic Surgery | Admitting: Occupational Therapy

## 2016-03-18 DIAGNOSIS — M25611 Stiffness of right shoulder, not elsewhere classified: Secondary | ICD-10-CM | POA: Insufficient documentation

## 2016-03-18 DIAGNOSIS — R29898 Other symptoms and signs involving the musculoskeletal system: Secondary | ICD-10-CM | POA: Insufficient documentation

## 2016-03-18 DIAGNOSIS — M25511 Pain in right shoulder: Secondary | ICD-10-CM | POA: Diagnosis not present

## 2016-03-18 NOTE — Therapy (Signed)
[place], [place], [address] Phone: [phone]   Fax:  [phone]  Occupational Therapy Evaluation  Patient Details  Name: Kyle Collins MRN: [id number] Date of Birth: Dec 09, 1985 Referring Provider: Dr. Arther Abbott  Encounter Date: 03/18/2016      OT End of Session - 03/18/16 1618    Visit Number 1   Number of Visits 16   Date for OT Re-Evaluation 05/17/16  mini-reassessment 04/16/2016   Authorization Type Medicare and Medicaid   Authorization Time Period before 10th visit   Authorization - Visit Number 1   Authorization - Number of Visits 10   OT Start Time [id number]   OT Stop Time 1548   OT Time Calculation (min) 30 min   Activity Tolerance Patient tolerated treatment well   Behavior During Therapy [facility] for tasks assessed/performed      Past Medical History:  Diagnosis Date  . Anxiety   . Bipolar disorder ([place])   . Depression   . Diabetes mellitus   . GERD (gastroesophageal reflux disease)   . Headache   . Hypertension   . Sleep apnea   . Thyroid disease     Past Surgical History:  Procedure Laterality Date  . HERNIA REPAIR     scrotum  . ORIF SHOULDER FRACTURE Right 02/28/2016   Procedure: OPEN REDUCTION INTERNAL FIXATION (ORIF) SHOULDER FRACTURE;  Surgeon: Carole Civil, MD;  Location: [facility];  Service: Orthopedics;  Laterality: Right;  . SHOULDER CLOSED REDUCTION Right 12/04/2015   Procedure: CLOSED REDUCTION SHOULDER;  Surgeon: Carole Civil, MD;  Location: [facility];  Service: Orthopedics;  Laterality: Right;  . TOOTH EXTRACTION  spring 2016   top left     There were no vitals filed for this visit.      Subjective Assessment - 03/18/16 1616    Subjective  S: I haven't worn my sling since the first day.    Pertinent History Pt is a 30 y/o male s/p right open RCR on 02/28/2016 after falling out of his bed in may and landing on his right shoulder, resulting in a right greater  tuberosity fracture and rotator cuff tear. Dr. Aline Brochure referred pt to occupational therapy for evaluation and treatment.    Special Tests FOTO 38/100 (62% impairment)   Patient Stated Goals I want to get my arm back to it's normal use.   Currently in Pain? No/denies           [facility] OT Assessment - 03/18/16 1522      Assessment   Diagnosis Right open RCR   Referring Provider Dr. Arther Abbott   Onset Date 02/28/16   Prior Therapy None     Precautions   Precautions Shoulder   Type of Shoulder Precautions P/ROM for 4 weeks (8/22-9/19)   Shoulder Interventions Shoulder sling/immobilizer  prn     Restrictions   Weight Bearing Restrictions No     Balance Screen   Has the patient fallen in the past 6 months Yes   How many times? 1   Has the patient had a decrease in activity level because of a fear of falling?  No   Is the patient reluctant to leave their home because of a fear of falling?  No     Home  Environment   Family/patient expects to be discharged to: Private residence     Prior Function   Level of Independence Independent   Vocation On disability   Leisure  gaming     ADL   ADL comments Pt is having difficulty with dressing, bathing, grooming, meal preparation, is unable to lift arm or lift any weighted objects due to precautions.      Written Expression   Dominant Hand Right     Cognition   Overall Cognitive Status Within Functional Limits for tasks assessed     ROM / Strength   AROM / PROM / Strength PROM     Palpation   Palpation comment Moderate fascial restrictions in right upper arm and trapezius regions     AROM   Overall AROM  Unable to assess;Due to precautions     PROM   Overall PROM Comments Assessed in supine, ER/IR adducted   Right Shoulder Flexion 150 Degrees   Right Shoulder ABduction 85 Degrees   Right Shoulder Internal Rotation 90 Degrees   Right Shoulder External Rotation -25 Degrees     Strength   Overall Strength Unable to  assess;Due to precautions                         OT Education - 03/18/16 1617    Education provided Yes   Education Details table slides   Person(s) Educated Patient   Methods Explanation;Demonstration;Handout   Comprehension Verbalized understanding;Returned demonstration          OT Short Term Goals - 03/18/16 1623      OT SHORT TERM GOAL #1   Title Patient will be educated and independent with a HEP for improving functional use of his right arm.   Time 4   Period Weeks   Status New     OT SHORT TERM GOAL #2   Title Patient will improve right shoulder P/ROM to WNL for increased functional use of right arm with daily tasks.   Time 4   Period Weeks   Status New     OT SHORT TERM GOAL #3   Title Patient will decrease intermittent pain to 4/10 in his right arm.   Time 4   Period Weeks   Status New     OT SHORT TERM GOAL #4   Title Pt will decrease fascial restrictions from mod to minimal amounts or less in RUE to improve mobility during functional tasks.    Time 4   Period Weeks   Status New           OT [place] Term Goals - 03/18/16 1625      OT Saephanh TERM GOAL #1   Title Patient will improve right shoulder A/ROM to [facility] for improved ability to complete functional activities at shoulder level.   Time 8   Period Weeks   Status New     OT Batta TERM GOAL #2   Title Patient will have 4/5 strength in his right shoulder for improved ability to lift items onto his counter top.   Time 8   Period Weeks   Status New     OT Matto TERM GOAL #3   Title Patient will decrease fascial restrictions to trace amounts or less in his right shoulder region for greater mobility needed for daily tasks.   Time 8   Period Weeks   Status New     OT [place] TERM GOAL #4   Title Patient will decrease pain level to 5210 or better in his right shoulder region during functional activities.    Time 8   Period Weeks   Status New  OT Demary TERM GOAL #5   Title Pt will  return to prior level of functioning and independence in daily tasks using RUE as dominant.    Time 8   Period Weeks   Status New               Plan - 2016/04/02 1620    Clinical Impression Statement A: Pt is a 30 y/o male presenting with increased pain and fascial restrictions, decreased range of motion and strength of RUE limiting ability to perform daily and leisure tasks using RUE as dominant. Pt is not wearing sling at this time. Provided pt with table slides and reviewed.    Rehab Potential Good   OT Frequency 2x / week   OT Duration 8 weeks   OT Treatment/Interventions Self-care/ADL training;Cryotherapy;Electrical Stimulation;Moist Heat;Ultrasound;Iontophoresis;Therapeutic exercise;Neuromuscular education;Energy conservation;DME and/or AE instruction;Passive range of motion;Manual Therapy;Therapeutic exercises;Therapeutic activities;Patient/family education   Plan P: Pt will benefit from skilled OT services to decrease pain and fascial restrictions and increase range of motion and strength of RUE, improving ability to use RUE during functional tasks. Treatment plan: myofascial release, manual therapy, P/ROM, AA/ROM, A/ROM, general RUE strengthening, scapular stability and strengthening, modalities prn   OT Home Exercise Plan table slides   Consulted and Agree with Plan of Care Patient      Patient will benefit from skilled therapeutic intervention in order to improve the following deficits and impairments:  Decreased strength, Decreased range of motion, Pain, Decreased safety awareness, Increased fascial restricitons, Impaired UE functional use  Visit Diagnosis: Pain in right shoulder  Stiffness of right shoulder, not elsewhere classified  Other symptoms and signs involving the musculoskeletal system      G-Codes - 04/02/2016 1626    Functional Assessment Tool Used FOTO 38/100, 62% impaired    Functional Limitation Carrying, moving and handling objects   Carrying, Moving  and Handling Objects Current Status [id number]) At least 60 percent but less than 80 percent impaired, limited or restricted   Carrying, Moving and Handling Objects Goal Status [id number]) At least 20 percent but less than 40 percent impaired, limited or restricted      Problem List Patient Active Problem List   Diagnosis Date Noted  . Hyperglycemia 12/04/2015  . Fracture dislocation of right shoulder joint 12/04/2015  . Dislocation, shoulder closed   . Greater tuberosity of humerus fracture   . Morbid obesity due to excess calories ([place]) 10/26/2015  . Uncontrolled type 2 diabetes mellitus without complication, without [place]-term current use of insulin ([place]) 07/16/2015  . Essential hypertension, benign 07/16/2015  . Personal history of noncompliance with medical treatment, presenting hazards to health 07/16/2015     Guadelupe Sabin, OTR/L  [id number] 2016/04/02, 4:38 PM  [place], [place], [address] Phone: [phone]   Fax:  [phone]  Name: Kyle Collins MRN: [id number] Date of Birth: Jun 11, 1986

## 2016-03-18 NOTE — Addendum Note (Signed)
Addended by: Guadelupe Sabin A on: 03/18/2016 05:01 PM   Modules accepted: Orders

## 2016-03-18 NOTE — Patient Instructions (Signed)
SHOULDER: Flexion On Table   Place hands on table, elbows straight. Move hips away from body. Press hands down into table.  _10-15__ reps per set, _1-2__ sets per day  Abduction (Passive)   With arm out to side, resting on table, lower head toward arm, keeping trunk away from table.  Repeat _10-15___ times. Do _1-2___ sessions per day.  Copyright  VHI. All rights reserved.     Internal Rotation (Assistive)   Seated with elbow bent at right angle and held against side, slide arm on table surface in an inward arc. Repeat __10-15__ times. Do _1-2___ sessions per day. Activity: Use this motion to brush crumbs off the table.  Copyright  VHI. All rights reserved.    

## 2016-03-21 ENCOUNTER — Ambulatory Visit: Payer: Medicare Other | Admitting: Orthopedic Surgery

## 2016-03-21 ENCOUNTER — Encounter (HOSPITAL_COMMUNITY): Payer: Self-pay

## 2016-03-21 ENCOUNTER — Ambulatory Visit (HOSPITAL_COMMUNITY): Payer: Medicare Other

## 2016-03-21 DIAGNOSIS — M25611 Stiffness of right shoulder, not elsewhere classified: Secondary | ICD-10-CM | POA: Diagnosis not present

## 2016-03-21 DIAGNOSIS — R29898 Other symptoms and signs involving the musculoskeletal system: Secondary | ICD-10-CM

## 2016-03-21 DIAGNOSIS — M25511 Pain in right shoulder: Secondary | ICD-10-CM | POA: Diagnosis not present

## 2016-03-21 NOTE — Therapy (Signed)
[place], [place], [address] Phone: [phone]   Fax:  [phone]  Occupational Therapy Treatment  Patient Details  Name: Kyle Collins MRN: [id number] Date of Birth: 10/30/1985 Referring Provider: Dr. Arther Abbott  Encounter Date: 03/21/2016      OT End of Session - 03/21/16 1351    Visit Number 2   Number of Visits 16   Date for OT Re-Evaluation 05/17/16  mini-reassessment 04/16/2016   Authorization Type Medicare and Medicaid   Authorization Time Period before 10th visit   Authorization - Visit Number 2   Authorization - Number of Visits 10   OT Start Time 1304   OT Stop Time 1345   OT Time Calculation (min) 41 min   Activity Tolerance Patient tolerated treatment well   Behavior During Therapy [facility] for tasks assessed/performed      Past Medical History:  Diagnosis Date  . Anxiety   . Bipolar disorder ([place])   . Depression   . Diabetes mellitus   . GERD (gastroesophageal reflux disease)   . Headache   . Hypertension   . Sleep apnea   . Thyroid disease     Past Surgical History:  Procedure Laterality Date  . HERNIA REPAIR     scrotum  . ORIF SHOULDER FRACTURE Right 02/28/2016   Procedure: OPEN REDUCTION INTERNAL FIXATION (ORIF) SHOULDER FRACTURE;  Surgeon: Carole Civil, MD;  Location: [facility];  Service: Orthopedics;  Laterality: Right;  . SHOULDER CLOSED REDUCTION Right 12/04/2015   Procedure: CLOSED REDUCTION SHOULDER;  Surgeon: Carole Civil, MD;  Location: [facility];  Service: Orthopedics;  Laterality: Right;  . TOOTH EXTRACTION  spring 2016   top left     There were no vitals filed for this visit.      Subjective Assessment - 03/21/16 1334    Subjective  S: I tried to pick my arm off the bed when I was laying             [facility] OT Assessment - 03/21/16 1330      Assessment   Diagnosis Right open RCR     Precautions   Precautions Shoulder   Type of Shoulder  Precautions P/ROM for 4 weeks (8/22-9/19)   Shoulder Interventions Shoulder sling/immobilizer  PRN                  OT Treatments/Exercises (OP) - 03/21/16 1306      Exercises   Exercises Shoulder     Shoulder Exercises: Supine   Protraction PROM;10 reps   Horizontal ABduction PROM;10 reps   External Rotation PROM;10 reps   Internal Rotation PROM;10 reps   Flexion PROM;10 reps   ABduction PROM;10 reps     Shoulder Exercises: Therapy Ball   Flexion --  12X   ABduction --  12X     Shoulder Exercises: ROM/Strengthening   Anterior Glide 3x10"     Shoulder Exercises: Isometric Strengthening   Flexion Supine;5X5"   Extension Supine;5X5"   External Rotation Supine;5X5"   Internal Rotation Supine;5X5"   ABduction Supine;5X5"   ADduction Supine;5X5"     Manual Therapy   Manual Therapy Myofascial release   Manual therapy comments Manual therapy completed prior to therapeutic exercise.    Myofascial Release Myofascial release completed to right upper arm, trapezius, and scapularis regions to decrease pain and fascial restrictions and increase joint range of motion.  OT Education - 03/21/16 1350    Education provided Yes   Education Details Patient was given OT evaluation print out. Reviewed goals and POC. Pt was given pendulum exercises to add to HEP.   Person(s) Educated Patient   Methods Explanation;Demonstration;Verbal cues;Handout   Comprehension Verbalized understanding          OT Short Term Goals - 03/21/16 1354      OT SHORT TERM GOAL #1   Title Patient will be educated and independent with a HEP for improving functional use of his right arm.   Time 4   Period Weeks   Status On-going     OT SHORT TERM GOAL #2   Title Patient will improve right shoulder P/ROM to WNL for increased functional use of right arm with daily tasks.   Time 4   Period Weeks   Status On-going     OT SHORT TERM GOAL #3   Title Patient will  decrease intermittent pain to 4/10 in his right arm.   Time 4   Period Weeks   Status On-going     OT SHORT TERM GOAL #4   Title Pt will decrease fascial restrictions from mod to minimal amounts or less in RUE to improve mobility during functional tasks.    Time 4   Period Weeks   Status On-going           OT Pr[place] Term Goals - 03/21/16 1354      OT [place] TERM GOAL #1   Title Patient will improve right shoulder A/ROM to [facility] for improved ability to complete functional activities at shoulder level.   Time 8   Period Weeks   Status On-going     OT [place] TERM GOAL #2   Title Patient will have 4/5 strength in his right shoulder for improved ability to lift items onto his counter top.   Time 8   Period Weeks   Status On-going     OT V[place] TERM GOAL #3   Title Patient will decrease fascial restrictions to trace amounts or less in his right shoulder region for greater mobility needed for daily tasks.   Time 8   Period Weeks   Status On-going     OT [place] TERM GOAL #4   Title Patient will decrease pain level to 5210 or better in his right shoulder region during functional activities.    Time 8   Period Weeks   Status On-going     OT Joye TERM GOAL #5   Title Pt will return to prior level of functioning and independence in daily tasks using RUE as dominant.    Time 8   Period Weeks   Status On-going               Plan - 03/21/16 1352    Clinical Impression Statement A: Initiated myofascial release, manual stretching, P/ROM, therapy ball stretches, and isometric exercises. VC for form and technique. patient re-educated to not try and pick up or move his arm against gravity right now per MD protocol.    Plan P: Add thumb tacks, pro/ret/elev/dept.   OT Home Exercise Plan 8/25: Added pendulum exercises to HEP.      Patient will benefit from skilled therapeutic intervention in order to improve the following deficits and impairments:  Decreased strength, Decreased range of  motion, Pain, Decreased safety awareness, Increased fascial restricitons, Impaired UE functional use  Visit Diagnosis: Stiffness of right shoulder, not elsewhere classified  Other symptoms and  signs involving the musculoskeletal system    Problem List Patient Active Problem List   Diagnosis Date Noted  . Hyperglycemia 12/04/2015  . Fracture dislocation of right shoulder joint 12/04/2015  . Dislocation, shoulder closed   . Greater tuberosity of humerus fracture   . Morbid obesity due to excess calories ([place]) 10/26/2015  . Uncontrolled type 2 diabetes mellitus without complication, without [place]-term current use of insulin ([place]) 07/16/2015  . Essential hypertension, benign 07/16/2015  . Personal history of noncompliance with medical treatment, presenting hazards to health 07/16/2015   Ailene Ravel, OTR/L,CBIS  [phone]  03/21/2016, 1:55 PM  [place], [place], [address] Phone: [phone]   Fax:  [phone]  Name: Kyle Collins MRN: [id number] Date of Birth: 1986-03-02

## 2016-03-21 NOTE — Patient Instructions (Signed)
    COMPLETE PENDULUM EXERCISES FOR 30 SECONDS TO A MINUTE EACH, 3-5 TIMES PER DAY. ROM: Pendulum (Side-to-Side)    http://orth.exer.us/792   Copyright  VHI. All rights reserved.  Pendulum Forward/Back   Bend forward 90 at waist, using table for support. Rock body forward and back to swing arm. Repeat ____ times. Do ____ sessions per day.  Copyright  VHI. All rights reserved.  Pendulum Circular   Bend forward 90 at waist, leaning on table for support. Rock body in a circular pattern to move arm clockwise ____ times then counterclockwise ____ times. Do ____ sessions per day.   

## 2016-03-24 ENCOUNTER — Ambulatory Visit (HOSPITAL_COMMUNITY): Payer: Medicare Other

## 2016-03-24 ENCOUNTER — Encounter (HOSPITAL_COMMUNITY): Payer: Self-pay

## 2016-03-24 DIAGNOSIS — R29898 Other symptoms and signs involving the musculoskeletal system: Secondary | ICD-10-CM | POA: Diagnosis not present

## 2016-03-24 DIAGNOSIS — M25611 Stiffness of right shoulder, not elsewhere classified: Secondary | ICD-10-CM | POA: Diagnosis not present

## 2016-03-24 DIAGNOSIS — M25511 Pain in right shoulder: Secondary | ICD-10-CM | POA: Diagnosis not present

## 2016-03-24 NOTE — Therapy (Signed)
[place], [place], [address] Phone: [phone]   Fax:  [phone]  Occupational Therapy Treatment  Patient Details  Name: Kyle Collins MRN: [id number] Date of Birth: May 05, 1986 Referring Provider: Dr. Arther Abbott  Encounter Date: 03/24/2016      OT End of Session - 03/24/16 1235    Visit Number 3   Number of Visits 16   Date for OT Re-Evaluation 05/17/16  mini-reassessment 04/16/2016   Authorization Type Medicare and Medicaid   Authorization Time Period before 10th visit   Authorization - Visit Number 3   Authorization - Number of Visits 10   OT Start Time 1117   OT Stop Time 1200   OT Time Calculation (min) 43 min   Activity Tolerance Patient tolerated treatment well   Behavior During Therapy [facility] for tasks assessed/performed      Past Medical History:  Diagnosis Date  . Anxiety   . Bipolar disorder ([place])   . Depression   . Diabetes mellitus   . GERD (gastroesophageal reflux disease)   . Headache   . Hypertension   . Sleep apnea   . Thyroid disease     Past Surgical History:  Procedure Laterality Date  . HERNIA REPAIR     scrotum  . ORIF SHOULDER FRACTURE Right 02/28/2016   Procedure: OPEN REDUCTION INTERNAL FIXATION (ORIF) SHOULDER FRACTURE;  Surgeon: Carole Civil, MD;  Location: [facility];  Service: Orthopedics;  Laterality: Right;  . SHOULDER CLOSED REDUCTION Right 12/04/2015   Procedure: CLOSED REDUCTION SHOULDER;  Surgeon: Carole Civil, MD;  Location: [facility];  Service: Orthopedics;  Laterality: Right;  . TOOTH EXTRACTION  spring 2016   top left     There were no vitals filed for this visit.      Subjective Assessment - 03/24/16 1231    Subjective  S: It only hurts once in a while and then it'll just be a twing.    Currently in Pain? No/denies            [facility] OT Assessment - 03/24/16 1141      Assessment   Diagnosis Right open RCR     Precautions   Precautions Shoulder   Type of Shoulder Precautions P/ROM for 4 weeks (8/22-9/19)   Shoulder Interventions Shoulder sling/immobilizer   Precaution Comments PRN                  OT Treatments/Exercises (OP) - 03/24/16 1141      Exercises   Exercises Shoulder     Shoulder Exercises: Supine   Protraction PROM;10 reps   Horizontal ABduction PROM;10 reps   External Rotation PROM;10 reps   Internal Rotation PROM;10 reps   Flexion PROM;10 reps   ABduction PROM;10 reps     Shoulder Exercises: Seated   Elevation AROM;15 reps   Extension AROM;15 reps   Row AROM;15 reps     Shoulder Exercises: Therapy Ball   Flexion 15 reps   ABduction 15 reps     Shoulder Exercises: ROM/Strengthening   Thumb Tacks 1'   Anterior Glide 3x10"   Prot/Ret//Elev/Dep 1'     Shoulder Exercises: Isometric Strengthening   Flexion Supine;5X5"   Extension Supine;5X5"   External Rotation Supine;5X5"   Internal Rotation Supine;5X5"   ABduction Supine;5X5"   ADduction Supine;5X5"     Manual Therapy   Manual Therapy Myofascial release   Manual therapy comments Manual therapy completed prior to therapeutic exercise.  Myofascial Release Myofascial release completed to right upper arm, trapezius, and scapularis regions to decrease pain and fascial restrictions and increase joint range of motion.                 OT Education - 03/24/16 1234    Education provided Yes   Education Details Pt reports that having his arm hang down to do the pendulum exercises hurts. Recommended breaking down and modifying the exercise. Instead of doing the pendulums just hold his arm down for 15-30 seconds.    Person(s) Educated Patient   Methods Explanation   Comprehension Verbalized understanding          OT Short Term Goals - 03/21/16 1354      OT SHORT TERM GOAL #1   Title Patient will be educated and independent with a HEP for improving functional use of his right arm.   Time 4   Period Weeks    Status On-going     OT SHORT TERM GOAL #2   Title Patient will improve right shoulder P/ROM to WNL for increased functional use of right arm with daily tasks.   Time 4   Period Weeks   Status On-going     OT SHORT TERM GOAL #3   Title Patient will decrease intermittent pain to 4/10 in his right arm.   Time 4   Period Weeks   Status On-going     OT SHORT TERM GOAL #4   Title Pt will decrease fascial restrictions from mod to minimal amounts or less in RUE to improve mobility during functional tasks.    Time 4   Period Weeks   Status On-going           OT Kretz Term Goals - 03/21/16 1354      OT Schnieders TERM GOAL #1   Title Patient will improve right shoulder A/ROM to [facility] for improved ability to complete functional activities at shoulder level.   Time 8   Period Weeks   Status On-going     OT Walston TERM GOAL #2   Title Patient will have 4/5 strength in his right shoulder for improved ability to lift items onto his counter top.   Time 8   Period Weeks   Status On-going     OT Silveira TERM GOAL #3   Title Patient will decrease fascial restrictions to trace amounts or less in his right shoulder region for greater mobility needed for daily tasks.   Time 8   Period Weeks   Status On-going     OT Cataldo TERM GOAL #4   Title Patient will decrease pain level to 5210 or better in his right shoulder region during functional activities.    Time 8   Period Weeks   Status On-going     OT Dieppa TERM GOAL #5   Title Pt will return to prior level of functioning and independence in daily tasks using RUE as dominant.    Time 8   Period Weeks   Status On-going               Plan - 03/24/16 1236    Clinical Impression Statement A: Added thumb tacks and pro/ret/elev/dep to focus on scapular mobility. Pt required verbal and tactile cues to complete with proper form.    Plan P: Increase isometrics to 3 or 5x10".      Patient will benefit from skilled therapeutic intervention in  order to improve the following deficits and impairments:  Decreased  strength, Decreased range of motion, Pain, Decreased safety awareness, Increased fascial restricitons, Impaired UE functional use  Visit Diagnosis: Stiffness of right shoulder, not elsewhere classified  Other symptoms and signs involving the musculoskeletal system    Problem List Patient Active Problem List   Diagnosis Date Noted  . Hyperglycemia 12/04/2015  . Fracture dislocation of right shoulder joint 12/04/2015  . Dislocation, shoulder closed   . Greater tuberosity of humerus fracture   . Morbid obesity due to excess calories ([place]) 10/26/2015  . Uncontrolled type 2 diabetes mellitus without complication, without Ragon-term current use of insulin ([place]) 07/16/2015  . Essential hypertension, benign 07/16/2015  . Personal history of noncompliance with medical treatment, presenting hazards to health 07/16/2015   Ailene Ravel, OTR/L,CBIS  [phone]  03/24/2016, 12:39 PM  [place], [place], [address] Phone: [phone]   Fax:  [phone]  Name: Kyle Collins MRN: [id number] Date of Birth: 1985/11/25

## 2016-04-02 ENCOUNTER — Ambulatory Visit (HOSPITAL_COMMUNITY): Payer: Medicare Other | Attending: Orthopedic Surgery | Admitting: Specialist

## 2016-04-02 DIAGNOSIS — R29898 Other symptoms and signs involving the musculoskeletal system: Secondary | ICD-10-CM

## 2016-04-02 DIAGNOSIS — M25511 Pain in right shoulder: Secondary | ICD-10-CM

## 2016-04-02 DIAGNOSIS — M25611 Stiffness of right shoulder, not elsewhere classified: Secondary | ICD-10-CM

## 2016-04-02 NOTE — Therapy (Signed)
[place], [place], [address] Phone: [phone]   Fax:  [phone]  Occupational Therapy Treatment  Patient Details  Name: Kyle Collins MRN: [id number] Date of Birth: [birth year] Referring Provider: Dr. Arther Abbott  Encounter Date: 04/02/2016      OT End of Session - 04/02/16 1113    Visit Number 4   Number of Visits 16   Date for OT Re-Evaluation 05/17/16  mini reassess on 9/20   Authorization Type Medicare and Medicaid   Authorization Time Period before 10th visit   Authorization - Visit Number 4   Authorization - Number of Visits 10   OT Start Time 1037   OT Stop Time 1112   OT Time Calculation (min) 35 min   Activity Tolerance Patient tolerated treatment well   Behavior During Therapy [facility] for tasks assessed/performed      Past Medical History:  Diagnosis Date  . Anxiety   . Bipolar disorder ([place])   . Depression   . Diabetes mellitus   . GERD (gastroesophageal reflux disease)   . Headache   . Hypertension   . Sleep apnea   . Thyroid disease     Past Surgical History:  Procedure Laterality Date  . HERNIA REPAIR     scrotum  . ORIF SHOULDER FRACTURE Right 02/28/2016   Procedure: OPEN REDUCTION INTERNAL FIXATION (ORIF) SHOULDER FRACTURE;  Surgeon: Carole Civil, MD;  Location: [facility];  Service: Orthopedics;  Laterality: Right;  . SHOULDER CLOSED REDUCTION Right 12/04/2015   Procedure: CLOSED REDUCTION SHOULDER;  Surgeon: Carole Civil, MD;  Location: [facility];  Service: Orthopedics;  Laterality: Right;  . TOOTH EXTRACTION  spring 2016   top left     There were no vitals filed for this visit.      Subjective Assessment - 04/02/16 1035    Subjective  S:  I dont have to wear the sling anymore    Pain Score 2    Pain Location Shoulder   Pain Orientation Right   Pain Descriptors / Indicators Aching   Pain Type Acute pain   Pain Onset More than a month ago   Pain Frequency  Intermittent   Aggravating Factors  positioning   Pain Relieving Factors repositioning   Effect of Pain on Daily Activities unable to use dominant arm with daily tasks            [facility] OT Assessment - 04/02/16 0001      Assessment   Diagnosis Right open RCR     Precautions   Precautions Shoulder   Type of Shoulder Precautions P/ROM for 4 weeks (8/22-9/19), 9-12 weeks PRE, 12-24 weeks A/ROM and PRE                  OT Treatments/Exercises (OP) - 04/02/16 0001      Exercises   Exercises Shoulder     Shoulder Exercises: Supine   Protraction PROM;10 reps   Horizontal ABduction PROM;10 reps   External Rotation PROM;10 reps   Internal Rotation PROM;10 reps   Flexion PROM;10 reps   ABduction PROM;10 reps   Other Supine Exercises bridges 10 X      Shoulder Exercises: Seated   Elevation AROM;15 reps   Extension AROM;15 reps   Row AROM;15 reps     Shoulder Exercises: Therapy Ball   Flexion 15 reps   ABduction 15 reps     Shoulder Exercises: ROM/Strengthening   Thumb Tacks 1'  Anterior Glide 3x10"   Caudal Glide 3 X 10"   Prot/Ret//Elev/Dep 1'     Shoulder Exercises: Isometric Strengthening   Flexion Supine;5X5"   Extension Supine;5X5"   External Rotation Supine;5X5"   Internal Rotation Supine;5X5"   ABduction Supine;5X5"   ADduction Supine;5X5"     Manual Therapy   Manual Therapy Myofascial release   Manual therapy comments Manual therapy completed prior to therapeutic exercise.    Myofascial Release Myofascial release completed to right upper arm, trapezius, and scapularis regions to decrease pain and fascial restrictions and increase joint range of motion.                   OT Short Term Goals - 03/21/16 1354      OT SHORT TERM GOAL #1   Title Patient will be educated and independent with a HEP for improving functional use of his right arm.   Time 4   Period Weeks   Status On-going     OT SHORT TERM GOAL #2   Title Patient will  improve right shoulder P/ROM to WNL for increased functional use of right arm with daily tasks.   Time 4   Period Weeks   Status On-going     OT SHORT TERM GOAL #3   Title Patient will decrease intermittent pain to 4/10 in his right arm.   Time 4   Period Weeks   Status On-going     OT SHORT TERM GOAL #4   Title Pt will decrease fascial restrictions from mod to minimal amounts or less in RUE to improve mobility during functional tasks.    Time 4   Period Weeks   Status On-going           OT Muro Term Goals - 03/21/16 1354      OT Sherrin TERM GOAL #1   Title Patient will improve right shoulder A/ROM to [facility] for improved ability to complete functional activities at shoulder level.   Time 8   Period Weeks   Status On-going     OT Shough TERM GOAL #2   Title Patient will have 4/5 strength in his right shoulder for improved ability to lift items onto his counter top.   Time 8   Period Weeks   Status On-going     OT Paolucci TERM GOAL #3   Title Patient will decrease fascial restrictions to trace amounts or less in his right shoulder region for greater mobility needed for daily tasks.   Time 8   Period Weeks   Status On-going     OT Quaranta TERM GOAL #4   Title Patient will decrease pain level to 5210 or better in his right shoulder region during functional activities.    Time 8   Period Weeks   Status On-going     OT Tomasetti TERM GOAL #5   Title Pt will return to prior level of functioning and independence in daily tasks using RUE as dominant.    Time 8   Period Weeks   Status On-going               Plan - 04/02/16 1114    Clinical Impression Statement A:  Patient continues to experience discomfort with P/ROM flexion, abduction, external rotation past 50-75% range. added caudal glide and bridging this date for improved scapular mobility.    Plan P:  Increase isometric contraction time, improve P/ROM to 75% without pain for greater independence with functional activities.  Patient will benefit from skilled therapeutic intervention in order to improve the following deficits and impairments:  Decreased strength, Decreased range of motion, Pain, Decreased safety awareness, Increased fascial restricitons, Impaired UE functional use  Visit Diagnosis: Stiffness of right shoulder, not elsewhere classified  Other symptoms and signs involving the musculoskeletal system  Pain in right shoulder    Problem List Patient Active Problem List   Diagnosis Date Noted  . Hyperglycemia 12/04/2015  . Fracture dislocation of right shoulder joint 12/04/2015  . Dislocation, shoulder closed   . Greater tuberosity of humerus fracture   . Morbid obesity due to excess calories ([place]) 10/26/2015  . Uncontrolled type 2 diabetes mellitus without complication, without Kendra-term current use of insulin ([place]) 07/16/2015  . Essential hypertension, benign 07/16/2015  . Personal history of noncompliance with medical treatment, presenting hazards to health 07/16/2015    Vangie Bicker, [place], OTR/L [phone]  04/02/2016, [place], [place], [address] Phone: [phone]   Fax:  [phone]  Name: Kyle Collins MRN: [id number] Date of Birth: May 21, 1986

## 2016-04-04 ENCOUNTER — Encounter (HOSPITAL_COMMUNITY): Payer: Self-pay | Admitting: Occupational Therapy

## 2016-04-04 ENCOUNTER — Ambulatory Visit (HOSPITAL_COMMUNITY): Payer: Medicare Other | Admitting: Occupational Therapy

## 2016-04-04 DIAGNOSIS — R29898 Other symptoms and signs involving the musculoskeletal system: Secondary | ICD-10-CM | POA: Diagnosis not present

## 2016-04-04 DIAGNOSIS — M25611 Stiffness of right shoulder, not elsewhere classified: Secondary | ICD-10-CM | POA: Diagnosis not present

## 2016-04-04 DIAGNOSIS — M25511 Pain in right shoulder: Secondary | ICD-10-CM | POA: Diagnosis not present

## 2016-04-04 NOTE — Therapy (Signed)
[place], [place], [address] Phone: [phone]   Fax:  [phone]  Occupational Therapy Treatment  Patient Details  Name: Kyle Collins MRN: [id number] Date of Birth: 10/22/1985 Referring Provider: Dr. Arther Abbott  Encounter Date: 04/04/2016      OT End of Session - 04/04/16 1203    Visit Number 5   Number of Visits 16   Date for OT Re-Evaluation 05/17/16  mini reassess on 9/20   Authorization Type Medicare and Medicaid   Authorization Time Period before 10th visit   Authorization - Visit Number 5   Authorization - Number of Visits 10   OT Start Time 1324   OT Stop Time 1201   OT Time Calculation (min) 45 min   Activity Tolerance Patient tolerated treatment well   Behavior During Therapy [facility] for tasks assessed/performed      Past Medical History:  Diagnosis Date  . Anxiety   . Bipolar disorder ([place])   . Depression   . Diabetes mellitus   . GERD (gastroesophageal reflux disease)   . Headache   . Hypertension   . Sleep apnea   . Thyroid disease     Past Surgical History:  Procedure Laterality Date  . HERNIA REPAIR     scrotum  . ORIF SHOULDER FRACTURE Right 02/28/2016   Procedure: OPEN REDUCTION INTERNAL FIXATION (ORIF) SHOULDER FRACTURE;  Surgeon: Carole Civil, MD;  Location: [facility];  Service: Orthopedics;  Laterality: Right;  . SHOULDER CLOSED REDUCTION Right 12/04/2015   Procedure: CLOSED REDUCTION SHOULDER;  Surgeon: Carole Civil, MD;  Location: [facility];  Service: Orthopedics;  Laterality: Right;  . TOOTH EXTRACTION  spring 2016   top left     There were no vitals filed for this visit.      Subjective Assessment - 04/04/16 1118    Subjective  S:    Currently in Pain? No/denies            [facility] OT Assessment - 04/04/16 1117      Assessment   Diagnosis Right open RCR     Precautions   Precautions Shoulder   Type of Shoulder Precautions P/ROM for 4 weeks  (8/22-9/19), 9-12 weeks PRE, 12-24 weeks A/ROM and PRE                  OT Treatments/Exercises (OP) - 04/04/16 1119      Exercises   Exercises Shoulder     Shoulder Exercises: Supine   Protraction PROM;10 reps   Horizontal ABduction PROM;10 reps   External Rotation PROM;10 reps   Internal Rotation PROM;10 reps   Flexion PROM;10 reps   ABduction PROM;10 reps     Shoulder Exercises: Seated   Elevation AROM;15 reps   Extension AROM;15 reps   Row AROM;15 reps     Shoulder Exercises: Therapy Ball   Flexion 15 reps   ABduction 15 reps     Shoulder Exercises: ROM/Strengthening   Thumb Tacks 1'   Anterior Glide 3x10"   Caudal Glide 3 X 10"   Prot/Ret//Elev/Dep 1'     Shoulder Exercises: Isometric Strengthening   Flexion Supine;5X10"   Extension Supine;5X10"   External Rotation Supine;5X10"   Internal Rotation Supine;5X10"   ABduction Supine;5X10"   ADduction Supine;5X10"     Manual Therapy   Manual Therapy Myofascial release   Manual therapy comments Manual therapy completed prior to therapeutic exercise.    Myofascial Release Myofascial release completed to right  upper arm, trapezius, and scapularis regions to decrease pain and fascial restrictions and increase joint range of motion.                   OT Short Term Goals - 03/21/16 1354      OT SHORT TERM GOAL #1   Title Patient will be educated and independent with a HEP for improving functional use of his right arm.   Time 4   Period Weeks   Status On-going     OT SHORT TERM GOAL #2   Title Patient will improve right shoulder P/ROM to WNL for increased functional use of right arm with daily tasks.   Time 4   Period Weeks   Status On-going     OT SHORT TERM GOAL #3   Title Patient will decrease intermittent pain to 4/10 in his right arm.   Time 4   Period Weeks   Status On-going     OT SHORT TERM GOAL #4   Title Pt will decrease fascial restrictions from mod to minimal amounts or less  in RUE to improve mobility during functional tasks.    Time 4   Period Weeks   Status On-going           OT Latino Term Goals - 03/21/16 1354      OT Belay TERM GOAL #1   Title Patient will improve right shoulder A/ROM to [facility] for improved ability to complete functional activities at shoulder level.   Time 8   Period Weeks   Status On-going     OT Clock TERM GOAL #2   Title Patient will have 4/5 strength in his right shoulder for improved ability to lift items onto his counter top.   Time 8   Period Weeks   Status On-going     OT Whicker TERM GOAL #3   Title Patient will decrease fascial restrictions to trace amounts or less in his right shoulder region for greater mobility needed for daily tasks.   Time 8   Period Weeks   Status On-going     OT Skipper TERM GOAL #4   Title Patient will decrease pain level to 5210 or better in his right shoulder region during functional activities.    Time 8   Period Weeks   Status On-going     OT Kutter TERM GOAL #5   Title Pt will return to prior level of functioning and independence in daily tasks using RUE as dominant.    Time 8   Period Weeks   Status On-going               Plan - 04/04/16 1203    Clinical Impression Statement A: Continued with P/ROM this session, pt able to tolerate P/ROM flexion and abduction to 75% range, ER continues to be painful and at 40-50% range. Increased isometric contraction time to 5x10.    Plan P: continue with P/ROM per protocol, continue working towards P/ROM up to and greater than 75% within pain tolerance      Patient will benefit from skilled therapeutic intervention in order to improve the following deficits and impairments:  Decreased strength, Decreased range of motion, Pain, Decreased safety awareness, Increased fascial restricitons, Impaired UE functional use  Visit Diagnosis: Stiffness of right shoulder, not elsewhere classified  Other symptoms and signs involving the musculoskeletal  system  Pain in right shoulder    Problem List Patient Active Problem List   Diagnosis Date Noted  .  Hyperglycemia 12/04/2015  . Fracture dislocation of right shoulder joint 12/04/2015  . Dislocation, shoulder closed   . Greater tuberosity of humerus fracture   . Morbid obesity due to excess calories ([place]) 10/26/2015  . Uncontrolled type 2 diabetes mellitus without complication, without Godsey-term current use of insulin ([place]) 07/16/2015  . Essential hypertension, benign 07/16/2015  . Personal history of noncompliance with medical treatment, presenting hazards to health 07/16/2015   Guadelupe Sabin, OTR/L  [phone] 04/04/2016, 12:07 PM  [place], [place], [address] Phone: [phone]   Fax:  [phone]  Name: Kyle Collins MRN: [id number] Date of Birth: August 16, 1985

## 2016-04-07 ENCOUNTER — Ambulatory Visit (HOSPITAL_COMMUNITY): Payer: Medicare Other | Admitting: Specialist

## 2016-04-07 ENCOUNTER — Telehealth (HOSPITAL_COMMUNITY): Payer: Self-pay

## 2016-04-07 NOTE — Telephone Encounter (Signed)
04/07/16 his social worker called and said that he couldn't come today he was sick

## 2016-04-10 ENCOUNTER — Ambulatory Visit (HOSPITAL_COMMUNITY): Payer: Medicare Other

## 2016-04-10 DIAGNOSIS — M25611 Stiffness of right shoulder, not elsewhere classified: Secondary | ICD-10-CM

## 2016-04-10 NOTE — Therapy (Signed)
[place], [place], [address] Phone: [phone]   Fax:  [phone]  Patient Details  Name: Kyle Collins MRN: [id number] Date of Birth: 06/02/1986 Referring Provider:  Carole Civil, MD  Encounter Date: 04/10/2016   Patient arrived for therapy although was not seen. Patient reports that he was sick with a fever, congestion, and cough. Spoke with patient and recommended that patient not complete his therapy due to his illness and to go home and rest. Patient was worried about missing too much therapy as he will be going to visit his mother and he does not have an appointment scheduled until 04/18/16. Therapist reassured patient that it was important to take of himself and get better and to complete his HEP at home as he is able to.     Ailene Ravel, OTR/L,CBIS  [phone]   04/10/2016, [place], [place], [address] Phone: [phone]   Fax:  [phone]

## 2016-04-11 ENCOUNTER — Ambulatory Visit: Payer: Medicare Other | Admitting: Orthopedic Surgery

## 2016-04-11 DIAGNOSIS — J069 Acute upper respiratory infection, unspecified: Secondary | ICD-10-CM | POA: Diagnosis not present

## 2016-04-11 DIAGNOSIS — J40 Bronchitis, not specified as acute or chronic: Secondary | ICD-10-CM | POA: Diagnosis not present

## 2016-04-15 ENCOUNTER — Telehealth (HOSPITAL_COMMUNITY): Payer: Self-pay

## 2016-04-15 NOTE — Telephone Encounter (Signed)
04/15/16 caller cx - said there was a funeral that he was going to attend.

## 2016-04-18 ENCOUNTER — Encounter (HOSPITAL_COMMUNITY): Payer: Medicare Other | Admitting: Occupational Therapy

## 2016-04-28 ENCOUNTER — Encounter (HOSPITAL_COMMUNITY): Payer: Self-pay

## 2016-04-28 ENCOUNTER — Ambulatory Visit (HOSPITAL_COMMUNITY): Payer: Medicare Other | Attending: Orthopedic Surgery

## 2016-04-28 DIAGNOSIS — M25511 Pain in right shoulder: Secondary | ICD-10-CM | POA: Insufficient documentation

## 2016-04-28 DIAGNOSIS — R29898 Other symptoms and signs involving the musculoskeletal system: Secondary | ICD-10-CM | POA: Diagnosis not present

## 2016-04-28 DIAGNOSIS — M25611 Stiffness of right shoulder, not elsewhere classified: Secondary | ICD-10-CM | POA: Insufficient documentation

## 2016-04-28 NOTE — Therapy (Signed)
[place], [place], [address] Phone: [phone]   Fax:  [phone]  Occupational Therapy Treatment And mini reassessment Patient Details  Name: Kyle Collins MRN: [id number] Date of Birth: 1986/03/22 Referring Provider: Dr. Arther Abbott  Encounter Date: 04/28/2016      OT End of Session - 04/28/16 1558    Visit Number 6   Number of Visits 16   Date for OT Re-Evaluation 05/17/16   Authorization Type Medicare and Medicaid   Authorization Time Period before 10th visit   Authorization - Visit Number 6   Authorization - Number of Visits 10   OT Start Time [id number]  mini reassessment   OT Stop Time 1600   OT Time Calculation (min) 38 min   Activity Tolerance Patient tolerated treatment well   Behavior During Therapy [facility] for tasks assessed/performed      Past Medical History:  Diagnosis Date  . Anxiety   . Bipolar disorder ([place])   . Depression   . Diabetes mellitus   . GERD (gastroesophageal reflux disease)   . Headache   . Hypertension   . Sleep apnea   . Thyroid disease     Past Surgical History:  Procedure Laterality Date  . HERNIA REPAIR     scrotum  . ORIF SHOULDER FRACTURE Right 02/28/2016   Procedure: OPEN REDUCTION INTERNAL FIXATION (ORIF) SHOULDER FRACTURE;  Surgeon: Carole Civil, MD;  Location: [facility];  Service: Orthopedics;  Laterality: Right;  . SHOULDER CLOSED REDUCTION Right 12/04/2015   Procedure: CLOSED REDUCTION SHOULDER;  Surgeon: Carole Civil, MD;  Location: [facility];  Service: Orthopedics;  Laterality: Right;  . TOOTH EXTRACTION  spring 2016   top left     There were no vitals filed for this visit.      Subjective Assessment - 04/28/16 1546    Subjective  S: I wasn't on top of my exercisesas much as I was because I was so sick.    Currently in Pain? No/denies            [facility] OT Assessment - 04/28/16 1528      Assessment   Diagnosis Right open RCR     Precautions   Precautions Shoulder   Type of Shoulder Precautions P/ROM for 4 weeks (8/22-9/19), 9-12 weeks PRE, 12-24 weeks A/ROM and PRE     AROM   Overall AROM Comments Assessed in supine. IR/er adducted. A/ROM not measured prior.    AROM Assessment Site Shoulder   Right/Left Shoulder Right   Right Shoulder Flexion 135 Degrees   Right Shoulder ABduction 125 Degrees   Right Shoulder Internal Rotation 90 Degrees   Right Shoulder External Rotation 55 Degrees     PROM   Overall PROM Comments Assessed in supine, er/IR adducted   PROM Assessment Site Shoulder   Right/Left Shoulder Right   Right Shoulder Flexion 160 Degrees  previous: 150   Right Shoulder ABduction 180 Degrees  previous: 85   Right Shoulder Internal Rotation 90 Degrees  previous: same   Right Shoulder External Rotation 50 Degrees  previous: -25                  OT Treatments/Exercises (OP) - 04/28/16 1536      Exercises   Exercises Shoulder     Shoulder Exercises: Supine   Protraction PROM;5 reps;AAROM;10 reps   Horizontal ABduction PROM;5 reps;AAROM;10 reps   External Rotation PROM;5 reps;AAROM;10 reps   Internal Rotation PROM;5  reps;AAROM;10 reps   Flexion PROM;5 reps;AAROM;10 reps   ABduction PROM;5 reps;AAROM;10 reps     Shoulder Exercises: Standing   Protraction AAROM;10 reps   Horizontal ABduction AAROM;10 reps;Limitations  n   Horizontal ABduction Limitations Physical assist to hold rod to decrease shoulder elevatio   External Rotation AAROM;10 reps   Internal Rotation AAROM;10 reps   Flexion AAROM;10 reps     Shoulder Exercises: Pulleys   Flexion 1 minute   ABduction 1 minute     Shoulder Exercises: ROM/Strengthening   Wall Wash 1'     Manual Therapy   Manual Therapy Myofascial release   Manual therapy comments Manual therapy completed prior to therapeutic exercise.    Myofascial Release Myofascial release completed to right upper arm, trapezius, and scapularis regions to  decrease pain and fascial restrictions and increase joint range of motion.                   OT Short Term Goals - 04/28/16 2028      OT SHORT TERM GOAL #1   Title Patient will be educated and independent with a HEP for improving functional use of his right arm.   Time 4   Period Weeks   Status Achieved     OT SHORT TERM GOAL #2   Title Patient will improve right shoulder P/ROM to WNL for increased functional use of right arm with daily tasks.   Time 4   Period Weeks   Status Achieved     OT SHORT TERM GOAL #3   Title Patient will decrease intermittent pain to 4/10 in his right arm.   Time 4   Period Weeks   Status Achieved     OT SHORT TERM GOAL #4   Title Pt will decrease fascial restrictions from mod to minimal amounts or less in RUE to improve mobility during functional tasks.    Time 4   Period Weeks   Status Achieved           OT [place] Term Goals - 04/28/16 2029      OT Kuz[place] TERM GOAL #1   Title Patient will improve right shoulder A/ROM to [facility] for improved ability to complete functional activities at shoulder level.   Time 8   Period Weeks   Status On-going     OT [place] TERM GOAL #2   Title Patient will have 4/5 strength in his right shoulder for improved ability to lift items onto his counter top.   Time 8   Period Weeks   Status On-going     OT [place] TERM GOAL #3   Title Patient will decrease fascial restrictions to trace amounts or less in his right shoulder region for greater mobility needed for daily tasks.   Time 8   Period Weeks   Status On-going     OT [place] TERM GOAL #4   Title Patient will decrease pain level to 5/10 or better in his right shoulder region during functional activities.    Time 8   Period Weeks   Status On-going     OT [place] TERM GOAL #5   Title Pt will return to prior level of functioning and independence in daily tasks using RUE as dominant.    Time 8   Period Weeks   Status On-going               Plan -  04/28/16 2024    Clinical Impression Statement A: Mini reassessment completed this session  as patient has not been to therapy for approximately 4 weeks due to sickness, death in the family, and visiting his mother. Regardless of his lack of attendance, patient did make significant gains in his Passive and active ROM measurements. Pt continues to have deficits in ROM as well as strength and fascial restrictions. Pt progressed to AA/ROM exercises this session requiring assistance from therapist to off load arm during standing horizontal abduction to decrease occurance of shoulder/scapular elevation. VC for form and technique as needed.    Plan P: Update HEP when appropriate. Add PVC pipe slide.       Patient will benefit from skilled therapeutic intervention in order to improve the following deficits and impairments:  Decreased strength, Decreased range of motion, Pain, Decreased safety awareness, Increased fascial restricitons, Impaired UE functional use  Visit Diagnosis: Stiffness of right shoulder, not elsewhere classified  Other symptoms and signs involving the musculoskeletal system    Problem List Patient Active Problem List   Diagnosis Date Noted  . Hyperglycemia 12/04/2015  . Fracture dislocation of right shoulder joint 12/04/2015  . Dislocation, shoulder closed   . Greater tuberosity of humerus fracture   . Morbid obesity due to excess calories ([place]) 10/26/2015  . Uncontrolled type 2 diabetes mellitus without complication, without [place]-term current use of insulin ([place]) 07/16/2015  . Essential hypertension, benign 07/16/2015  . Personal history of noncompliance with medical treatment, presenting hazards to health 07/16/2015   Ailene Ravel, OTR/L,CBIS  [phone]  04/28/2016, 8:34 PM  [place], [place], [address] Phone: [phone]   Fax:  [phone]  Name: Kyle Collins MRN: [id number] Date of  Birth: 10-12-1985

## 2016-05-01 ENCOUNTER — Encounter (HOSPITAL_COMMUNITY): Payer: Self-pay | Admitting: Occupational Therapy

## 2016-05-01 ENCOUNTER — Ambulatory Visit (HOSPITAL_COMMUNITY): Payer: Medicare Other | Admitting: Occupational Therapy

## 2016-05-01 DIAGNOSIS — M25511 Pain in right shoulder: Secondary | ICD-10-CM | POA: Diagnosis not present

## 2016-05-01 DIAGNOSIS — R29898 Other symptoms and signs involving the musculoskeletal system: Secondary | ICD-10-CM | POA: Diagnosis not present

## 2016-05-01 DIAGNOSIS — M25611 Stiffness of right shoulder, not elsewhere classified: Secondary | ICD-10-CM | POA: Diagnosis not present

## 2016-05-01 NOTE — Patient Instructions (Signed)
Perform each exercise 10-15 reps. 1-3x days.   Protraction - STANDING  Start by holding a wand or cane at chest height.  Next, slowly push the wand outwards in front of your body so that your elbows become fully straightened. Then, return to the original position.     Shoulder FLEXION - STANDING - PALMS UP  In the standing position, hold a wand/cane with both arms, palms up on both sides. Raise up the wand/cane allowing your unaffected arm to perform most of the effort. Your affected arm should be partially relaxed.      Internal/External ROTATION - STANDING  In the standing position, hold a wand/cane with both hands keeping your elbows bent. Move your arms and wand/cane to one side.  Your affected arm should be partially relaxed while your unaffected arm performs most of the effort.       Shoulder ABDUCTION - STANDING  While holding a wand/cane palm face up on the injured side and palm face down on the uninjured side, slowly raise up your injured arm to the side.                     Horizontal Abduction/Adduction      Straight arms holding cane at shoulder height, bring cane to right, center, left. Repeat starting to left.   Copyright  VHI. All rights reserved.       

## 2016-05-01 NOTE — Therapy (Signed)
[place], [place], [address] Phone: [phone]   Fax:  [phone]  Occupational Therapy Treatment  Patient Details  Name: Kyle Collins MRN: [id number] Date of Birth: 01-24-1986 Referring Provider: Dr. Arther Abbott  Encounter Date: 05/01/2016      OT End of Session - 05/01/16 1626    Visit Number 7   Number of Visits 16   Date for OT Re-Evaluation 05/17/16   Authorization Type Medicare and Medicaid   Authorization Time Period before 10th visit   Authorization - Visit Number 7   Authorization - Number of Visits 10   OT Start Time 0300   OT Stop Time 1428   OT Time Calculation (min) 40 min   Activity Tolerance Patient tolerated treatment well   Behavior During Therapy [facility] for tasks assessed/performed      Past Medical History:  Diagnosis Date  . Anxiety   . Bipolar disorder ([place])   . Depression   . Diabetes mellitus   . GERD (gastroesophageal reflux disease)   . Headache   . Hypertension   . Sleep apnea   . Thyroid disease     Past Surgical History:  Procedure Laterality Date  . HERNIA REPAIR     scrotum  . ORIF SHOULDER FRACTURE Right 02/28/2016   Procedure: OPEN REDUCTION INTERNAL FIXATION (ORIF) SHOULDER FRACTURE;  Surgeon: Carole Civil, MD;  Location: [facility];  Service: Orthopedics;  Laterality: Right;  . SHOULDER CLOSED REDUCTION Right 12/04/2015   Procedure: CLOSED REDUCTION SHOULDER;  Surgeon: Carole Civil, MD;  Location: [facility];  Service: Orthopedics;  Laterality: Right;  . TOOTH EXTRACTION  spring 2016   top left     There were no vitals filed for this visit.      Subjective Assessment - 05/01/16 1351    Subjective  S: The only motion that really bothers me is reaching behind my back.    Currently in Pain? No/denies            [facility] OT Assessment - 05/01/16 1347      Assessment   Diagnosis Right open RCR     Precautions   Precautions Shoulder   Type of  Shoulder Precautions P/ROM for 4 weeks (8/22-9/19), 9-12 weeks PRE, 12-24 weeks A/ROM and PRE                  OT Treatments/Exercises (OP) - 05/01/16 1351      Exercises   Exercises Shoulder     Shoulder Exercises: Supine   Protraction PROM;5 reps;AAROM;10 reps   Horizontal ABduction PROM;5 reps;AAROM;10 reps   External Rotation PROM;5 reps;AAROM;10 reps   Internal Rotation PROM;5 reps;AAROM;10 reps   Flexion PROM;5 reps;AAROM;10 reps   ABduction PROM;5 reps;AAROM;10 reps     Shoulder Exercises: Standing   Protraction AAROM;10 reps   Horizontal ABduction AAROM;10 reps   External Rotation AAROM;10 reps   Internal Rotation AAROM;10 reps   Flexion AAROM;10 reps   ABduction AAROM;10 reps   Other Standing Exercises PVC Pipe slide, 10X     Shoulder Exercises: Therapy Ball   Right/Left --  3 reps each direction     Shoulder Exercises: ROM/Strengthening   Wall Wash 2'   Proximal Shoulder Strengthening, Supine 10X each no rest breaks   Prot/Ret//Elev/Dep 1'     Manual Therapy   Manual Therapy Myofascial release   Manual therapy comments Manual therapy completed prior to therapeutic exercise.    Myofascial Release Myofascial  release completed to right upper arm, trapezius, and scapularis regions to decrease pain and fascial restrictions and increase joint range of motion.                 OT Education - 05/01/16 1626    Education provided Yes   Education Details AA/ROM exercises   Person(s) Educated Patient   Methods Explanation;Demonstration;Handout   Comprehension Verbalized understanding;Returned demonstration          OT Short Term Goals - 04/28/16 2028      OT SHORT TERM GOAL #1   Title Patient will be educated and independent with a HEP for improving functional use of his right arm.   Time 4   Period Weeks   Status Achieved     OT SHORT TERM GOAL #2   Title Patient will improve right shoulder P/ROM to WNL for increased functional use of  right arm with daily tasks.   Time 4   Period Weeks   Status Achieved     OT SHORT TERM GOAL #3   Title Patient will decrease intermittent pain to 4/10 in his right arm.   Time 4   Period Weeks   Status Achieved     OT SHORT TERM GOAL #4   Title Pt will decrease fascial restrictions from mod to minimal amounts or less in RUE to improve mobility during functional tasks.    Time 4   Period Weeks   Status Achieved           OT [place] Term Goals - 04/28/16 2029      OT Peer TERM GOAL #1   Title Patient will improve right shoulder A/ROM to [facility] for improved ability to complete functional activities at shoulder level.   Time 8   Period Weeks   Status On-going     OT [place] TERM GOAL #2   Title Patient will have 4/5 strength in his right shoulder for improved ability to lift items onto his counter top.   Time 8   Period Weeks   Status On-going     OT [place] TERM GOAL #3   Title Patient will decrease fascial restrictions to trace amounts or less in his right shoulder region for greater mobility needed for daily tasks.   Time 8   Period Weeks   Status On-going     OT [place] TERM GOAL #4   Title Patient will decrease pain level to 5/10 or better in his right shoulder region during functional activities.    Time 8   Period Weeks   Status On-going     OT [place] TERM GOAL #5   Title Pt will return to prior level of functioning and independence in daily tasks using RUE as dominant.    Time 8   Period Weeks   Status On-going               Plan - 05/01/16 1626    Clinical Impression Statement A: Continued AA/ROM this session, pt with improved standing exercises as he was able to complete horizontal abduction with min verbal cuing and no tactile assist. Pt required verbal cuing for form and technique, added therapy ball circles to begin working on improved IR. Pt provided with AA/ROM HEP.    Plan P: Increase AA/ROM repetitions to 12 supine and standing. Add scapular theraband if  pt able to complete with good form. Follow up on AA/ROM HEP.    OT Home Exercise Plan 8/25: Added pendulum exercises to HEP.  10/5: AA/ROM exercises   Consulted and Agree with Plan of Care Patient      Patient will benefit from skilled therapeutic intervention in order to improve the following deficits and impairments:  Decreased strength, Decreased range of motion, Pain, Decreased safety awareness, Increased fascial restricitons, Impaired UE functional use  Visit Diagnosis: Stiffness of right shoulder, not elsewhere classified  Other symptoms and signs involving the musculoskeletal system    Problem List Patient Active Problem List   Diagnosis Date Noted  . Hyperglycemia 12/04/2015  . Fracture dislocation of right shoulder joint 12/04/2015  . Dislocation, shoulder closed   . Greater tuberosity of humerus fracture   . Morbid obesity due to excess calories ([place]) 10/26/2015  . Uncontrolled type 2 diabetes mellitus without complication, without [place]-term current use of insulin ([place]) 07/16/2015  . Essential hypertension, benign 07/16/2015  . Personal history of noncompliance with medical treatment, presenting hazards to health 07/16/2015   Guadelupe Sabin, OTR/L  [phone] 05/01/2016, 4:29 PM  [place], [place], [address] Phone: [phone]   Fax:  [phone]  Name: Kyle Collins MRN: [id number] Date of Birth: [birth year]

## 2016-05-09 ENCOUNTER — Ambulatory Visit (HOSPITAL_COMMUNITY): Payer: Medicare Other | Admitting: Specialist

## 2016-05-09 DIAGNOSIS — M25611 Stiffness of right shoulder, not elsewhere classified: Secondary | ICD-10-CM

## 2016-05-09 DIAGNOSIS — M25511 Pain in right shoulder: Secondary | ICD-10-CM | POA: Diagnosis not present

## 2016-05-09 DIAGNOSIS — R29898 Other symptoms and signs involving the musculoskeletal system: Secondary | ICD-10-CM

## 2016-05-09 NOTE — Therapy (Signed)
[place], [place], [address] Phone: [phone]   Fax:  [phone]  Occupational Therapy Treatment  Patient Details  Name: Kyle Collins MRN: [id number] Date of Birth: Jul 12, 1986 Referring Provider: Dr. Arther Abbott  Encounter Date: 05/09/2016      OT End of Session - 05/09/16 1233    Visit Number 8   Number of Visits 16   Date for OT Re-Evaluation 05/17/16   Authorization Type Medicare and Medicaid   Authorization Time Period before 10th visit   Authorization - Visit Number 8   Authorization - Number of Visits 10   OT Start Time [phone]   OT Stop Time 1030   OT Time Calculation (min) 38 min   Activity Tolerance Patient tolerated treatment well   Behavior During Therapy [facility] for tasks assessed/performed      Past Medical History:  Diagnosis Date  . Anxiety   . Bipolar disorder ([place])   . Depression   . Diabetes mellitus   . GERD (gastroesophageal reflux disease)   . Headache   . Hypertension   . Sleep apnea   . Thyroid disease     Past Surgical History:  Procedure Laterality Date  . HERNIA REPAIR     scrotum  . ORIF SHOULDER FRACTURE Right 02/28/2016   Procedure: OPEN REDUCTION INTERNAL FIXATION (ORIF) SHOULDER FRACTURE;  Surgeon: Carole Civil, MD;  Location: [facility];  Service: Orthopedics;  Laterality: Right;  . SHOULDER CLOSED REDUCTION Right 12/04/2015   Procedure: CLOSED REDUCTION SHOULDER;  Surgeon: Carole Civil, MD;  Location: [facility];  Service: Orthopedics;  Laterality: Right;  . TOOTH EXTRACTION  spring 2016   top left     There were no vitals filed for this visit.      Subjective Assessment - 05/09/16 0952    Subjective  S:  Its been a bit stiff today, but not sore.    Currently in Pain? No/denies            [facility] OT Assessment - 05/09/16 0001      Assessment   Diagnosis Right open RCR     Precautions   Precautions Shoulder   Type of Shoulder Precautions P/ROM  for 4 weeks (8/22-9/19), 9-12 weeks PRE, 12-24 weeks A/ROM and PRE                  OT Treatments/Exercises (OP) - 05/09/16 0001      Exercises   Exercises Shoulder     Shoulder Exercises: Supine   Protraction PROM;5 reps;AAROM;15 reps   Horizontal ABduction PROM;5 reps;AAROM;15 reps   External Rotation PROM;5 reps;AAROM;15 reps   Internal Rotation PROM;5 reps;AAROM;15 reps   Flexion PROM;5 reps;AAROM;15 reps   ABduction PROM;5 reps;AAROM;15 reps     Shoulder Exercises: Standing   Protraction AAROM;12 reps   Horizontal ABduction AAROM;12 reps   External Rotation AAROM;12 reps   Internal Rotation AAROM;12 reps   Flexion AAROM;12 reps   ABduction AAROM;12 reps   Extension Theraband;10 reps   Theraband Level (Shoulder Extension) Level 2 (Red)   Row Theraband;10 reps   Theraband Level (Shoulder Row) Level 2 (Red)     Shoulder Exercises: Pulleys   Flexion 2 minutes   ABduction 2 minutes     Shoulder Exercises: Therapy Ball   Flexion 10 reps   ABduction 10 reps   Right/Left 5 reps     Manual Therapy   Manual Therapy Myofascial release   Manual therapy comments  Manual therapy completed prior to therapeutic exercise.    Myofascial Release Myofascial release completed to right upper arm, trapezius, and scapularis regions to decrease pain and fascial restrictions and increase joint range of motion.                   OT Short Term Goals - 04/28/16 2028      OT SHORT TERM GOAL #1   Title Patient will be educated and independent with a HEP for improving functional use of his right arm.   Time 4   Period Weeks   Status Achieved     OT SHORT TERM GOAL #2   Title Patient will improve right shoulder P/ROM to WNL for increased functional use of right arm with daily tasks.   Time 4   Period Weeks   Status Achieved     OT SHORT TERM GOAL #3   Title Patient will decrease intermittent pain to 4/10 in his right arm.   Time 4   Period Weeks   Status Achieved      OT SHORT TERM GOAL #4   Title Pt will decrease fascial restrictions from mod to minimal amounts or less in RUE to improve mobility during functional tasks.    Time 4   Period Weeks   Status Achieved           OT [place] Term Goals - 04/28/16 2029      OT [place] TERM GOAL #1   Title Patient will improve right shoulder A/ROM to [facility] for improved ability to complete functional activities at shoulder level.   Time 8   Period Weeks   Status On-going     OT [place] TERM GOAL #2   Title Patient will have 4/5 strength in his right shoulder for improved ability to lift items onto his counter top.   Time 8   Period Weeks   Status On-going     OT [place] TERM GOAL #3   Title Patient will decrease fascial restrictions to trace amounts or less in his right shoulder region for greater mobility needed for daily tasks.   Time 8   Period Weeks   Status On-going     OT [place] TERM GOAL #4   Title Patient will decrease pain level to 5/10 or better in his right shoulder region during functional activities.    Time 8   Period Weeks   Status On-going     OT [place] TERM GOAL #5   Title Pt will return to prior level of functioning and independence in daily tasks using RUE as dominant.    Time 8   Period Weeks   Status On-going               Plan - 05/09/16 1233    Clinical Impression Statement A:  Patient able to complete scapular tband exercises this date for improved proximal shoulder strength.  patient reports good follow through with AA/ROM HEP.     Plan P:  continue AA/ROM and progress to A/ROM as protocol allows.  add pvc pipe corner slide in prep for function reaching.    Consulted and Agree with Plan of Care Patient      Patient will benefit from skilled therapeutic intervention in order to improve the following deficits and impairments:  Decreased strength, Decreased range of motion, Pain, Decreased safety awareness, Increased fascial restricitons, Impaired UE functional  use  Visit Diagnosis: Stiffness of right shoulder, not elsewhere classified  Other symptoms and signs  involving the musculoskeletal system  Acute pain of right shoulder    Problem List Patient Active Problem List   Diagnosis Date Noted  . Hyperglycemia 12/04/2015  . Fracture dislocation of right shoulder joint 12/04/2015  . Dislocation, shoulder closed   . Greater tuberosity of humerus fracture   . Morbid obesity due to excess calories ([place]) 10/26/2015  . Uncontrolled type 2 diabetes mellitus without complication, without [place]-term current use of insulin ([place]) 07/16/2015  . Essential hypertension, benign 07/16/2015  . Personal history of noncompliance with medical treatment, presenting hazards to health 07/16/2015    Vangie Bicker, [place], OTR/L [phone]  05/09/2016, 12:37 PM  [place], [place], [address] Phone: [phone]   Fax:  [phone]  Name: Kyle Collins MRN: [id number] Date of Birth: 04/03/1986

## 2016-05-14 ENCOUNTER — Ambulatory Visit (HOSPITAL_COMMUNITY): Payer: Medicare Other | Admitting: Specialist

## 2016-05-14 DIAGNOSIS — M25511 Pain in right shoulder: Secondary | ICD-10-CM

## 2016-05-14 DIAGNOSIS — R29898 Other symptoms and signs involving the musculoskeletal system: Secondary | ICD-10-CM

## 2016-05-14 DIAGNOSIS — M25611 Stiffness of right shoulder, not elsewhere classified: Secondary | ICD-10-CM

## 2016-05-14 NOTE — Therapy (Signed)
[facility] [place], [place], [address] Phone: [phone]   Fax:  [phone]  Occupational Therapy Treatment  Patient Details  Name: Kyle Collins MRN: [id number] Date of Birth: 05/19/1986 Referring Provider: Dr. Arther Abbott  Encounter Date: 05/14/2016      OT End of Session - 05/14/16 1647    Visit Number 9   Number of Visits 16   Authorization Type Medicare and Medicaid   Authorization Time Period before 10th visit   Authorization - Visit Number 9   Authorization - Number of Visits 10   OT Start Time [id number]   OT Stop Time 1600   OT Time Calculation (min) 45 min      Past Medical History:  Diagnosis Date  . Anxiety   . Bipolar disorder ([place])   . Depression   . Diabetes mellitus   . GERD (gastroesophageal reflux disease)   . Headache   . Hypertension   . Sleep apnea   . Thyroid disease     Past Surgical History:  Procedure Laterality Date  . HERNIA REPAIR     scrotum  . ORIF SHOULDER FRACTURE Right 02/28/2016   Procedure: OPEN REDUCTION INTERNAL FIXATION (ORIF) SHOULDER FRACTURE;  Surgeon: Carole Civil, MD;  Location: [facility];  Service: Orthopedics;  Laterality: Right;  . SHOULDER CLOSED REDUCTION Right 12/04/2015   Procedure: CLOSED REDUCTION SHOULDER;  Surgeon: Carole Civil, MD;  Location: [facility];  Service: Orthopedics;  Laterality: Right;  . TOOTH EXTRACTION  spring 2016   top left     There were no vitals filed for this visit.      Subjective Assessment - 05/14/16 1516    Subjective  S:  I went to [place] this weekend   Currently in Pain? No/denies   Pain Score 0-No pain            [facility] OT Assessment - 05/14/16 0001      Assessment   Diagnosis Right open RCR     Precautions   Precautions Shoulder   Type of Shoulder Precautions P/ROM for 4 weeks (8/22-9/19), 9-12 weeks PRE, 12-24 weeks A/ROM and PRE   Shoulder Interventions Shoulder sling/immobilizer                   OT Treatments/Exercises (OP) - 05/14/16 0001      Exercises   Exercises Shoulder     Shoulder Exercises: Supine   Protraction PROM;5 reps;AAROM;15 reps   Horizontal ABduction PROM;5 reps;AAROM;15 reps   External Rotation PROM;5 reps;AAROM;15 reps   Internal Rotation PROM;5 reps;AAROM;15 reps   Flexion PROM;5 reps;AAROM;15 reps   ABduction PROM;5 reps;AAROM;15 reps     Shoulder Exercises: Standing   Protraction AAROM;15 reps   Horizontal ABduction AAROM;15 reps   External Rotation AAROM;15 reps   Internal Rotation AAROM;15 reps   Flexion AAROM;15 reps   ABduction AAROM;15 reps   Extension Theraband;15 reps   Theraband Level (Shoulder Extension) Level 2 (Red)   Row Theraband;15 reps   Theraband Level (Shoulder Row) Level 2 (Red)   Other Standing Exercises PVC Pipe slide, 10X into flexion and 10 X into abduction      Shoulder Exercises: Pulleys   Flexion 2 minutes   ABduction 2 minutes     Shoulder Exercises: Therapy Ball   Flexion 10 reps   ABduction 10 reps   Right/Left 5 reps     Manual Therapy   Manual Therapy Myofascial release   Manual therapy  comments Manual therapy completed prior to therapeutic exercise.    Myofascial Release Myofascial release completed to right upper arm, trapezius, and scapularis regions to decrease pain and fascial restrictions and increase joint range of motion.                   OT Short Term Goals - 04/28/16 2028      OT SHORT TERM GOAL #1   Title Patient will be educated and independent with a HEP for improving functional use of his right arm.   Time 4   Period Weeks   Status Achieved     OT SHORT TERM GOAL #2   Title Patient will improve right shoulder P/ROM to WNL for increased functional use of right arm with daily tasks.   Time 4   Period Weeks   Status Achieved     OT SHORT TERM GOAL #3   Title Patient will decrease intermittent pain to 4/10 in his right arm.   Time 4   Period Weeks    Status Achieved     OT SHORT TERM GOAL #4   Title Pt will decrease fascial restrictions from mod to minimal amounts or less in RUE to improve mobility during functional tasks.    Time 4   Period Weeks   Status Achieved           OT [place] Term Goals - 04/28/16 2029      OT [place] TERM GOAL #1   Title Patient will improve right shoulder A/ROM to [facility] for improved ability to complete functional activities at shoulder level.   Time 8   Period Weeks   Status On-going     OT [place] TERM GOAL #2   Title Patient will have 4/5 strength in his right shoulder for improved ability to lift items onto his counter top.   Time 8   Period Weeks   Status On-going     OT [place] TERM GOAL #3   Title Patient will decrease fascial restrictions to trace amounts or less in his right shoulder region for greater mobility needed for daily tasks.   Time 8   Period Weeks   Status On-going     OT [place] TERM GOAL #4   Title Patient will decrease pain level to 5/10 or better in his right shoulder region during functional activities.    Time 8   Period Weeks   Status On-going     OT [place] TERM GOAL #5   Title Pt will return to prior level of functioning and independence in daily tasks using RUE as dominant.    Time 8   Period Weeks   Status On-going               Plan - 05/14/16 1647    Clinical Impression Statement A:  Added flexion and abduction PVC pipe slide this date with patient demonstrating full AA/ROM in both ranges.     Plan P:  Recertification and MD progress note, contact MD regarding protocol.       Patient will benefit from skilled therapeutic intervention in order to improve the following deficits and impairments:     Visit Diagnosis: Stiffness of right shoulder, not elsewhere classified  Other symptoms and signs involving the musculoskeletal system  Acute pain of right shoulder    Problem List Patient Active Problem List   Diagnosis Date Noted  . Hyperglycemia  12/04/2015  . Fracture dislocation of right shoulder joint 12/04/2015  . Dislocation, shoulder  closed   . Greater tuberosity of humerus fracture   . Morbid obesity due to excess calories ([place]) 10/26/2015  . Uncontrolled type 2 diabetes mellitus without complication, without [place]-term current use of insulin ([place]) 07/16/2015  . Essential hypertension, benign 07/16/2015  . Personal history of noncompliance with medical treatment, presenting hazards to health 07/16/2015    Vangie Bicker, [place], OTR/L [phone]  05/14/2016, 4:51 PM  [place], [place], [address] Phone: [phone]   Fax:  [phone]  Name: Kyle Collins MRN: [id number] Date of Birth: 29-Sep-1985

## 2016-05-15 DIAGNOSIS — E1165 Type 2 diabetes mellitus with hyperglycemia: Secondary | ICD-10-CM | POA: Diagnosis not present

## 2016-05-15 DIAGNOSIS — Z6841 Body Mass Index (BMI) 40.0 and over, adult: Secondary | ICD-10-CM | POA: Diagnosis not present

## 2016-05-15 DIAGNOSIS — N471 Phimosis: Secondary | ICD-10-CM | POA: Diagnosis not present

## 2016-05-15 DIAGNOSIS — Z0000 Encounter for general adult medical examination without abnormal findings: Secondary | ICD-10-CM | POA: Diagnosis not present

## 2016-05-15 DIAGNOSIS — Z23 Encounter for immunization: Secondary | ICD-10-CM | POA: Diagnosis not present

## 2016-05-15 DIAGNOSIS — I10 Essential (primary) hypertension: Secondary | ICD-10-CM | POA: Diagnosis not present

## 2016-05-16 ENCOUNTER — Ambulatory Visit (HOSPITAL_COMMUNITY): Payer: Medicare Other

## 2016-05-16 ENCOUNTER — Encounter (HOSPITAL_COMMUNITY): Payer: Self-pay

## 2016-05-16 DIAGNOSIS — R29898 Other symptoms and signs involving the musculoskeletal system: Secondary | ICD-10-CM

## 2016-05-16 DIAGNOSIS — M25611 Stiffness of right shoulder, not elsewhere classified: Secondary | ICD-10-CM | POA: Diagnosis not present

## 2016-05-16 DIAGNOSIS — M25511 Pain in right shoulder: Secondary | ICD-10-CM | POA: Diagnosis not present

## 2016-05-16 NOTE — Patient Instructions (Signed)
1) Shoulder Protraction    Begin with elbows by your side, slowly "punch" straight out in front of you.      2) Shoulder Flexion  Standing:         Begin with arms at your side with thumbs pointed up, slowly raise both arms up and forward towards overhead.          3) Horizontal abduction/adduction   Standing:           Begin with arms straight out in front of you, bring out to the side in at "T" shape. Keep arms straight entire time.       4) Internal & External Rotation    *No band* -Stand with elbows at the side and elbows bent 90 degrees. Move your forearms away from your body, then bring back inward toward the body.     5) Shoulder Abduction  Standing:       Lying on your back begin with your arms flat on the table next to your side. Slowly move your arms out to the side so that they go overhead, in a jumping jack or snow angel movement.       Repeat all exercises 10-15 times, 1-2 times per day.  

## 2016-05-16 NOTE — Therapy (Signed)
[place], [place], [address] Phone: [phone]   Fax:  [phone]  Occupational Therapy Treatment And reassessment  Patient Details  Name: Kyle Collins MRN: [id number] Date of Birth: 04-Apr-1986 Referring Provider: Dr. Arther Abbott  Encounter Date: 05/16/2016      OT End of Session - 05/16/16 1338    Visit Number 10   Number of Visits 16   Date for OT Re-Evaluation 06/15/16   Authorization Type Medicare and Medicaid   Authorization Time Period before 20th visit   Authorization - Visit Number 10   Authorization - Number of Visits 20   OT Start Time 1304  reassessment   OT Stop Time 1345   OT Time Calculation (min) 41 min   Activity Tolerance Patient tolerated treatment well   Behavior During Therapy [facility] for tasks assessed/performed      Past Medical History:  Diagnosis Date  . Anxiety   . Bipolar disorder ([place])   . Depression   . Diabetes mellitus   . GERD (gastroesophageal reflux disease)   . Headache   . Hypertension   . Sleep apnea   . Thyroid disease     Past Surgical History:  Procedure Laterality Date  . HERNIA REPAIR     scrotum  . ORIF SHOULDER FRACTURE Right 02/28/2016   Procedure: OPEN REDUCTION INTERNAL FIXATION (ORIF) SHOULDER FRACTURE;  Surgeon: Carole Civil, MD;  Location: [facility];  Service: Orthopedics;  Laterality: Right;  . SHOULDER CLOSED REDUCTION Right 12/04/2015   Procedure: CLOSED REDUCTION SHOULDER;  Surgeon: Carole Civil, MD;  Location: [facility];  Service: Orthopedics;  Laterality: Right;  . TOOTH EXTRACTION  spring 2016   top left     There were no vitals filed for this visit.          [facility] OT Assessment - 05/16/16 1304      Assessment   Diagnosis Right open RCR   Onset Date 02/28/16     Precautions   Precautions Shoulder   Type of Shoulder Precautions P/ROM for 4 weeks (8/22-9/19), 9-12 weeks PRE, 12-24 weeks A/ROM and PRE     Prior  Function   Level of Independence Independent     ROM / Strength   AROM / PROM / Strength AROM;PROM;Strength     AROM   Overall AROM Comments Assessed in supine. IR/er adducted.    AROM Assessment Site Shoulder   Right/Left Shoulder Right   Right Shoulder Flexion 165 Degrees  previuos: 135   Right Shoulder ABduction 180 Degrees  previous: 125   Right Shoulder Internal Rotation 90 Degrees  previous: same   Right Shoulder External Rotation 70 Degrees  previous: 55     PROM   Overall PROM Comments Assessed in supine, er/IR adducted   PROM Assessment Site Shoulder   Right/Left Shoulder Right   Right Shoulder Flexion 180 Degrees  previous: 160   Right Shoulder ABduction 180 Degrees  previous: 180   Right Shoulder Internal Rotation 90 Degrees  previous: 90   Right Shoulder External Rotation 65 Degrees  previous; 50     Strength   Overall Strength Comments Assessed seated. IR/er adducted. Strength not assessed prior to this date.   Strength Assessment Site Shoulder   Right/Left Shoulder Right                  OT Treatments/Exercises (OP) - 05/16/16 1313      Exercises   Exercises Shoulder  Shoulder Exercises: Supine   Protraction PROM;5 reps;AROM;10 reps   Horizontal ABduction PROM;5 reps;AROM;10 reps   External Rotation PROM;5 reps;AROM;10 reps   Internal Rotation PROM;5 reps;AROM;10 reps   Flexion PROM;5 reps;AROM;10 reps   ABduction PROM;5 reps;AROM;10 reps     Shoulder Exercises: Standing   Protraction AROM;10 reps   Horizontal ABduction AROM;10 reps   External Rotation AROM;10 reps   Internal Rotation AROM;10 reps   Flexion AROM;10 reps   ABduction AROM;10 reps   Extension Theraband;15 reps   Theraband Level (Shoulder Extension) Level 2 (Red)   Row Theraband;15 reps   Theraband Level (Shoulder Row) Level 2 (Red)   Retraction Theraband;15 reps     Shoulder Exercises: ROM/Strengthening   UBE (Upper Arm Bike) Level 1 2' reverse 2' forward    Over Head Lace 2'   "W" Arms 10X   Proximal Shoulder Strengthening, Supine 12X with no rest breaks     Manual Therapy   Manual Therapy --   Manual therapy comments --   Myofascial Release --                OT Education - 05/16/16 1325    Education provided Yes   Education Details A/ROM exercises   Person(s) Educated Patient   Methods Explanation;Demonstration;Verbal cues;Handout   Comprehension Returned demonstration;Verbalized understanding          OT Short Term Goals - 05/16/16 1318      OT SHORT TERM GOAL #1   Title Patient will be educated and independent with a HEP for improving functional use of his right arm.   Time 4   Period Weeks     OT SHORT TERM GOAL #2   Title Patient will improve right shoulder P/ROM to WNL for increased functional use of right arm with daily tasks.   Time 4   Period Weeks     OT SHORT TERM GOAL #3   Title Patient will decrease intermittent pain to 4/10 in his right arm.   Time 4   Period Weeks     OT SHORT TERM GOAL #4   Title Pt will decrease fascial restrictions from mod to minimal amounts or less in RUE to improve mobility during functional tasks.    Time 4   Period Weeks           OT [place] Term Goals - 05/16/16 1319      OT [place] TERM GOAL #1   Title Patient will improve right shoulder A/ROM to [facility] for improved ability to complete functional activities at shoulder level.   Time 8   Period Weeks   Status On-going     OT [place] TERM GOAL #2   Title Patient will have 4/5 strength in his right shoulder for improved ability to lift items onto his counter top.   Time 8   Period Weeks   Status On-going     OT [place] TERM GOAL #3   Title Patient will decrease fascial restrictions to trace amounts or less in his right shoulder region for greater mobility needed for daily tasks.   Time 8   Period Weeks   Status Achieved     OT [place] TERM GOAL #4   Title Patient will decrease pain level to 5/10 or better in his right  shoulder region during functional activities.    Time 8   Period Weeks   Status Achieved     OT [place] TERM GOAL #5   Title Pt will return to prior  level of functioning and independence in daily tasks using RUE as dominant.    Time 8   Period Weeks   Status On-going               Plan - 05/16/16 1345    Clinical Impression Statement A: Reassessment completed this date. Patient is progressing well with in therapy with all measurements increasing. Pt reports that strength is his biggest deficit at the time. Is able to use his RUE for all daily tasks. Progressed to A/ROM exercises this date and updated HEP.   Plan P: Follow up on updated HEP. Continue with A/ROM focusing on form and technique.  Myofascial release PRN.   OT Home Exercise Plan 8/25: Added pendulum exercises to HEP. 10/5: AA/ROM exercises 10/20: A/ROM exercises. May stop with pendulums and AA/ROM.      Patient will benefit from skilled therapeutic intervention in order to improve the following deficits and impairments:  Decreased strength, Decreased range of motion, Pain, Decreased safety awareness, Increased fascial restricitons, Impaired UE functional use  Visit Diagnosis: Stiffness of right shoulder, not elsewhere classified - Plan: Ot plan of care cert/re-cert  Other symptoms and signs involving the musculoskeletal system - Plan: Ot plan of care cert/re-cert      G-Codes - [id number]    Functional Assessment Tool Used FOTO score: 61/100 (39% impaired)   Functional Limitation Carrying, moving and handling objects   Carrying, Moving and Handling Objects Current Status [id number]) At least 20 percent but less than 40 percent impaired, limited or restricted   Carrying, Moving and Handling Objects Goal Status [id number]) At least 20 percent but less than 40 percent impaired, limited or restricted      Problem List Patient Active Problem List   Diagnosis Date Noted  . Hyperglycemia 12/04/2015  . Fracture dislocation  of right shoulder joint 12/04/2015  . Dislocation, shoulder closed   . Greater tuberosity of humerus fracture   . Morbid obesity due to excess calories ([place]) 10/26/2015  . Uncontrolled type 2 diabetes mellitus without complication, without [place]-term current use of insulin ([place]) 07/16/2015  . Essential hypertension, benign 07/16/2015  . Personal history of noncompliance with medical treatment, presenting hazards to health 07/16/2015   Ailene Ravel, OTR/L,CBIS  [phone]  05/16/2016, 2:21 PM  [place], [place], [address] Phone: [phone]   Fax:  [phone]  Name: Kyle Collins MRN: [id number] Date of Birth: February 17, 1986

## 2016-05-20 ENCOUNTER — Encounter (HOSPITAL_COMMUNITY): Payer: Self-pay | Admitting: Occupational Therapy

## 2016-05-20 ENCOUNTER — Ambulatory Visit (HOSPITAL_COMMUNITY): Payer: Medicare Other | Admitting: Occupational Therapy

## 2016-05-20 DIAGNOSIS — M25611 Stiffness of right shoulder, not elsewhere classified: Secondary | ICD-10-CM

## 2016-05-20 DIAGNOSIS — R29898 Other symptoms and signs involving the musculoskeletal system: Secondary | ICD-10-CM

## 2016-05-20 DIAGNOSIS — M25511 Pain in right shoulder: Secondary | ICD-10-CM | POA: Diagnosis not present

## 2016-05-20 NOTE — Therapy (Signed)
[place], [place], [address] Phone: [phone]   Fax:  [phone]  Occupational Therapy Treatment  Patient Details  Name: Kyle Collins MRN: [id number] Date of Birth: 1985/11/19 Referring Provider: Dr. Arther Abbott  Encounter Date: 05/20/2016      OT End of Session - 05/20/16 1508    Visit Number 11   Number of Visits 16   Date for OT Re-Evaluation 06/15/16   Authorization Type Medicare and Medicaid   Authorization Time Period before 20th visit   Authorization - Visit Number 11   Authorization - Number of Visits 20   OT Start Time [id number]   OT Stop Time 1510   OT Time Calculation (min) 39 min   Activity Tolerance Patient tolerated treatment well   Behavior During Therapy [facility] for tasks assessed/performed      Past Medical History:  Diagnosis Date  . Anxiety   . Bipolar disorder ([place])   . Depression   . Diabetes mellitus   . GERD (gastroesophageal reflux disease)   . Headache   . Hypertension   . Sleep apnea   . Thyroid disease     Past Surgical History:  Procedure Laterality Date  . HERNIA REPAIR     scrotum  . ORIF SHOULDER FRACTURE Right 02/28/2016   Procedure: OPEN REDUCTION INTERNAL FIXATION (ORIF) SHOULDER FRACTURE;  Surgeon: Carole Civil, MD;  Location: [facility];  Service: Orthopedics;  Laterality: Right;  . SHOULDER CLOSED REDUCTION Right 12/04/2015   Procedure: CLOSED REDUCTION SHOULDER;  Surgeon: Carole Civil, MD;  Location: [facility];  Service: Orthopedics;  Laterality: Right;  . TOOTH EXTRACTION  spring 2016   top left     There were no vitals filed for this visit.      Subjective Assessment - 05/20/16 1434    Subjective  S: I think I pulled a muscle in my shoulder blade area.    Currently in Pain? Yes   Pain Score 7    Pain Location Shoulder   Pain Orientation Right   Pain Descriptors / Indicators Aching   Pain Type Acute pain   Pain Radiating Towards n/a   Pain  Onset In the past 7 days   Pain Frequency Intermittent   Aggravating Factors  movement   Pain Relieving Factors sleeping   Effect of Pain on Daily Activities difficulty using RUE during daily tasks   Multiple Pain Sites No            [facility] OT Assessment - 05/20/16 1432      Assessment   Diagnosis Right open RCR     Precautions   Precautions Shoulder   Type of Shoulder Precautions P/ROM for 4 weeks (8/22-9/19), 9-12 weeks PRE, 12-24 weeks A/ROM and PRE                  OT Treatments/Exercises (OP) - 05/20/16 1435      Exercises   Exercises Shoulder     Shoulder Exercises: Supine   Protraction PROM;5 reps;AROM;10 reps   Horizontal ABduction PROM;5 reps;AROM;10 reps   External Rotation PROM;5 reps;AROM;10 reps   Internal Rotation PROM;5 reps;AROM;10 reps   Flexion PROM;5 reps;AROM;10 reps   ABduction PROM;5 reps;AROM;10 reps     Shoulder Exercises: Sidelying   External Rotation AROM;10 reps   Internal Rotation AROM;10 reps   Flexion AROM;10 reps   ABduction AROM;10 reps   Other Sidelying Exercises protraction, A/ROM, 10X   Other Sidelying Exercises Horizontal  abduction, A/ROM, 10X     Shoulder Exercises: Standing   Protraction AROM;10 reps   Horizontal ABduction AROM;10 reps   External Rotation AROM;10 reps   Internal Rotation AROM;10 reps   Flexion AROM;10 reps   ABduction AROM;10 reps   Extension Theraband;15 reps   Theraband Level (Shoulder Extension) Level 2 (Red)   Row Theraband;15 reps   Theraband Level (Shoulder Row) Level 2 (Red)   Retraction Theraband;15 reps   Theraband Level (Shoulder Retraction) Level 2 (Red)     Shoulder Exercises: ROM/Strengthening   UBE (Upper Arm Bike) Level 1 3' reverse 3' forward   Proximal Shoulder Strengthening, Supine 12X with no rest breaks     Manual Therapy   Manual Therapy Myofascial release   Manual therapy comments Manual therapy completed prior to therapeutic exercise.    Myofascial Release Myofascial  release completed to right upper arm, trapezius, and scapularis regions to decrease pain and fascial restrictions and increase joint range of motion.                   OT Short Term Goals - 05/16/16 1318      OT SHORT TERM GOAL #1   Title Patient will be educated and independent with a HEP for improving functional use of his right arm.   Time 4   Period Weeks     OT SHORT TERM GOAL #2   Title Patient will improve right shoulder P/ROM to WNL for increased functional use of right arm with daily tasks.   Time 4   Period Weeks     OT SHORT TERM GOAL #3   Title Patient will decrease intermittent pain to 4/10 in his right arm.   Time 4   Period Weeks     OT SHORT TERM GOAL #4   Title Pt will decrease fascial restrictions from mod to minimal amounts or less in RUE to improve mobility during functional tasks.    Time 4   Period Weeks           OT [place] Term Goals - 05/16/16 1319      OT [place] TERM GOAL #1   Title Patient will improve right shoulder A/ROM to [facility] for improved ability to complete functional activities at shoulder level.   Time 8   Period Weeks   Status On-going     OT [place] TERM GOAL #2   Title Patient will have 4/5 strength in his right shoulder for improved ability to lift items onto his counter top.   Time 8   Period Weeks   Status On-going     OT [place] TERM GOAL #3   Title Patient will decrease fascial restrictions to trace amounts or less in his right shoulder region for greater mobility needed for daily tasks.   Time 8   Period Weeks   Status Achieved     OT [place] TERM GOAL #4   Title Patient will decrease pain level to 5/10 or better in his right shoulder region during functional activities.    Time 8   Period Weeks   Status Achieved     OT [place] TERM GOAL #5   Title Pt will return to prior level of functioning and independence in daily tasks using RUE as dominant.    Time 8   Period Weeks   Status On-going               Plan -  05/20/16 1509    Clinical Impression Statement A:  Pt reports pain around medial scapula region this session, myofascial release completed however no new knots palpated. Added A/ROM in sidelying, increased UBE to 3' both directions. Pt reports pain as 1/10 at end of session compared to 7/10 on arrival. Pt reports A/ROM HEP is going well.    Plan P: Myofascial release prn. Continue with A/ROM and scapular theraband, update HEP for scapular theraband.    OT Home Exercise Plan 8/25: Added pendulum exercises to HEP. 10/5: AA/ROM exercises 10/20: A/ROM exercises. May stop with pendulums and AA/ROM.      Patient will benefit from skilled therapeutic intervention in order to improve the following deficits and impairments:  Decreased strength, Decreased range of motion, Pain, Decreased safety awareness, Increased fascial restricitons, Impaired UE functional use  Visit Diagnosis: Stiffness of right shoulder, not elsewhere classified  Other symptoms and signs involving the musculoskeletal system  Acute pain of right shoulder    Problem List Patient Active Problem List   Diagnosis Date Noted  . Hyperglycemia 12/04/2015  . Fracture dislocation of right shoulder joint 12/04/2015  . Dislocation, shoulder closed   . Greater tuberosity of humerus fracture   . Morbid obesity due to excess calories ([place]) 10/26/2015  . Uncontrolled type 2 diabetes mellitus without complication, without [place]-term current use of insulin ([place]) 07/16/2015  . Essential hypertension, benign 07/16/2015  . Personal history of noncompliance with medical treatment, presenting hazards to health 07/16/2015   Guadelupe Sabin, OTR/L  [phone] 05/20/2016, 3:11 PM  [place], [place], [address] Phone: [phone]   Fax:  [phone]  Name: Kyle Collins MRN: [id number] Date of Birth: 1986-02-20

## 2016-05-23 ENCOUNTER — Encounter (HOSPITAL_COMMUNITY): Payer: Self-pay

## 2016-05-23 ENCOUNTER — Ambulatory Visit (HOSPITAL_COMMUNITY): Payer: Medicare Other

## 2016-05-23 DIAGNOSIS — M25611 Stiffness of right shoulder, not elsewhere classified: Secondary | ICD-10-CM

## 2016-05-23 DIAGNOSIS — R29898 Other symptoms and signs involving the musculoskeletal system: Secondary | ICD-10-CM

## 2016-05-23 DIAGNOSIS — M25511 Pain in right shoulder: Secondary | ICD-10-CM | POA: Diagnosis not present

## 2016-05-23 NOTE — Patient Instructions (Signed)

## 2016-05-23 NOTE — Therapy (Signed)
[place], [place], [address] Phone: [phone]   Fax:  [phone]  Occupational Therapy Treatment  Patient Details  Name: Kyle Collins MRN: [id number] Date of Birth: 06/10/1986 Referring Provider: Dr. Arther Abbott  Encounter Date: 05/23/2016      OT End of Session - 05/23/16 1429    Visit Number 12   Number of Visits 16   Date for OT Re-Evaluation 06/15/16   Authorization Type Medicare and Medicaid   Authorization Time Period before 20th visit   Authorization - Visit Number 12   Authorization - Number of Visits 20   OT Start Time [id number]   OT Stop Time 1432   OT Time Calculation (min) 38 min   Activity Tolerance Patient tolerated treatment well   Behavior During Therapy [facility] for tasks assessed/performed      Past Medical History:  Diagnosis Date  . Anxiety   . Bipolar disorder ([place])   . Depression   . Diabetes mellitus   . GERD (gastroesophageal reflux disease)   . Headache   . Hypertension   . Sleep apnea   . Thyroid disease     Past Surgical History:  Procedure Laterality Date  . HERNIA REPAIR     scrotum  . ORIF SHOULDER FRACTURE Right 02/28/2016   Procedure: OPEN REDUCTION INTERNAL FIXATION (ORIF) SHOULDER FRACTURE;  Surgeon: Carole Civil, MD;  Location: [facility];  Service: Orthopedics;  Laterality: Right;  . SHOULDER CLOSED REDUCTION Right 12/04/2015   Procedure: CLOSED REDUCTION SHOULDER;  Surgeon: Carole Civil, MD;  Location: [facility];  Service: Orthopedics;  Laterality: Right;  . TOOTH EXTRACTION  spring 2016   top left     There were no vitals filed for this visit.      Subjective Assessment - 05/23/16 1408    Subjective  S: I may have slept weird because my shoulder is a little sore.    Currently in Pain? Yes   Pain Score 2    Pain Location Shoulder   Pain Orientation Right   Pain Descriptors / Indicators Aching   Pain Type Acute pain            [facility] OT  Assessment - 05/23/16 1408      Assessment   Diagnosis Right open RCR     Precautions   Precautions Shoulder   Type of Shoulder Precautions P/ROM for 4 weeks (8/22-9/19), 9-12 weeks PRE, 12-24 weeks A/ROM and PRE                  OT Treatments/Exercises (OP) - 05/23/16 1409      Exercises   Exercises Shoulder     Shoulder Exercises: Supine   Protraction PROM;5 reps;AROM;12 reps   Horizontal ABduction PROM;5 reps;AROM;12 reps   External Rotation PROM;5 reps;AROM;12 reps   Internal Rotation PROM;5 reps;AROM;12 reps   Flexion PROM;5 reps;AROM;12 reps   ABduction PROM;5 reps;AROM;12 reps     Shoulder Exercises: Standing   Protraction AROM;12 reps   Horizontal ABduction AROM;12 reps   External Rotation AROM;12 reps   Internal Rotation AROM;12 reps   Flexion AROM;12 reps   ABduction AROM;12 reps   Extension Theraband;15 reps   Theraband Level (Shoulder Extension) Level 2 (Red)   Row Theraband;15 reps   Theraband Level (Shoulder Row) Level 2 (Red)   Retraction Theraband;15 reps   Theraband Level (Shoulder Retraction) Level 2 (Red)     Shoulder Exercises: ROM/Strengthening   UBE (Upper Arm  Bike) Level 1 3' reverse 3' forward   Proximal Shoulder Strengthening, Supine 12X with no rest breaks   Proximal Shoulder Strengthening, Seated 12X with no rest breaks   Ball on Wall 1' flexion 1' abduction      Manual Therapy   Manual Therapy Myofascial release   Manual therapy comments Manual therapy completed prior to therapeutic exercise.    Myofascial Release Myofascial release completed to right upper arm, trapezius, and scapularis regions to decrease pain and fascial restrictions and increase joint range of motion.                 OT Education - 05/23/16 1429    Education provided Yes   Education Details Scapular strengthening exercises - red   Person(s) Educated Patient   Methods Explanation;Demonstration;Handout;Verbal cues   Comprehension Verbalized  understanding;Returned demonstration          OT Short Term Goals - 05/16/16 1318      OT SHORT TERM GOAL #1   Title Patient will be educated and independent with a HEP for improving functional use of his right arm.   Time 4   Period Weeks     OT SHORT TERM GOAL #2   Title Patient will improve right shoulder P/ROM to WNL for increased functional use of right arm with daily tasks.   Time 4   Period Weeks     OT SHORT TERM GOAL #3   Title Patient will decrease intermittent pain to 4/10 in his right arm.   Time 4   Period Weeks     OT SHORT TERM GOAL #4   Title Pt will decrease fascial restrictions from mod to minimal amounts or less in RUE to improve mobility during functional tasks.    Time 4   Period Weeks           OT [place]ose Term Goals - 05/23/16 1452      OT Mo TERM GOAL #1   Title Patient will improve right shoulder A/ROM to [facility] for improved ability to complete functional activities at shoulder level.   Time 8   Period Weeks   Status On-going     OT Graham TERM GOAL #2   Title Patient will have 4/5 strength in his right shoulder for improved ability to lift items onto his counter top.   Time 8   Period Weeks   Status On-going     OT Gavitt TERM GOAL #3   Title Patient will decrease fascial restrictions to trace amounts or less in his right shoulder region for greater mobility needed for daily tasks.   Time 8   Period Weeks     OT Ezzell TERM GOAL #4   Title Patient will decrease pain level to 5/10 or better in his right shoulder region during functional activities.    Time 8   Period Weeks     OT G[place] TERM GOAL #5   Title Pt will return to prior level of functioning and independence in daily tasks using RUE as dominant.    Time 8   Period Weeks   Status On-going               Plan - 05/23/16 1448    Clinical Impression Statement A; Pt reports that he may have slept wrong last night and is experiencing some sore in the anterior portion of his  shoulder. Myofascial release completed to area prior to exercises. VC to complete standing Abduction (A/ROM) to only half range due  to shoulder elevation. Focused on keeping shoulder tight and arms straight.    Plan P: Myofascial release PRN. Add overhead lacing. Continue to focus on decreasing shoulder elevation when completing abduction and above shoulder level reaching activities.       Patient will benefit from skilled therapeutic intervention in order to improve the following deficits and impairments:  Decreased strength, Decreased range of motion, Pain, Decreased safety awareness, Increased fascial restricitons, Impaired UE functional use  Visit Diagnosis: Stiffness of right shoulder, not elsewhere classified  Other symptoms and signs involving the musculoskeletal system  Acute pain of right shoulder    Problem List Patient Active Problem List   Diagnosis Date Noted  . Hyperglycemia 12/04/2015  . Fracture dislocation of right shoulder joint 12/04/2015  . Dislocation, shoulder closed   . Greater tuberosity of humerus fracture   . Morbid obesity due to excess calories ([place]) 10/26/2015  . Uncontrolled type 2 diabetes mellitus without complication, without [place]-term current use of insulin ([place]) 07/16/2015  . Essential hypertension, benign 07/16/2015  . Personal history of noncompliance with medical treatment, presenting hazards to health 07/16/2015   Ailene Ravel, OTR/L,CBIS  [phone]  05/23/2016, 2:53 PM  [place], [place], [address] Phone: [phone]   Fax:  [phone]  Name: Kyle Collins MRN: [id number] Date of Birth: September 06, 1985

## 2016-05-26 ENCOUNTER — Telehealth (HOSPITAL_COMMUNITY): Payer: Self-pay | Admitting: Family Medicine

## 2016-05-26 NOTE — Telephone Encounter (Signed)
05/26/16 the social worker called to change the appt to accomodate her schedule but we did reschedule the appt.

## 2016-05-27 ENCOUNTER — Ambulatory Visit (HOSPITAL_COMMUNITY): Payer: Medicare Other

## 2016-05-28 ENCOUNTER — Ambulatory Visit (HOSPITAL_COMMUNITY): Payer: Medicare Other | Attending: Orthopedic Surgery | Admitting: Specialist

## 2016-05-28 DIAGNOSIS — M25511 Pain in right shoulder: Secondary | ICD-10-CM | POA: Insufficient documentation

## 2016-05-28 DIAGNOSIS — R29898 Other symptoms and signs involving the musculoskeletal system: Secondary | ICD-10-CM | POA: Diagnosis not present

## 2016-05-28 DIAGNOSIS — M25611 Stiffness of right shoulder, not elsewhere classified: Secondary | ICD-10-CM | POA: Insufficient documentation

## 2016-05-28 NOTE — Therapy (Signed)
[name], [place], [address] Phone: [phone]   Fax:  [phone]  Occupational Therapy Treatment  Patient Details  Name: Kyle Collins MRN: [id number] Date of Birth: April 21, 1986 Referring Provider: Dr. Arther Collins  Encounter Date: 05/28/2016      OT End of Session - 05/28/16 1414    Visit Number 13   Number of Visits 16   Date for OT Re-Evaluation 06/15/16   Authorization Type Medicare and Medicaid   Authorization Time Period before 20th visit   Authorization - Visit Number 13   Authorization - Number of Visits 20   OT Start Time [id number]   OT Stop Time 1346   OT Time Calculation (min) 41 min   Activity Tolerance Patient tolerated treatment well   Behavior During Therapy [facility] for tasks assessed/performed      Past Medical History:  Diagnosis Date  . Anxiety   . Bipolar disorder ([place])   . Depression   . Diabetes mellitus   . GERD (gastroesophageal reflux disease)   . Headache   . Hypertension   . Sleep apnea   . Thyroid disease     Past Surgical History:  Procedure Laterality Date  . HERNIA REPAIR     scrotum  . ORIF SHOULDER FRACTURE Right 02/28/2016   Procedure: OPEN REDUCTION INTERNAL FIXATION (ORIF) SHOULDER FRACTURE;  Surgeon: Kyle Civil, MD;  Location: [facility];  Service: Orthopedics;  Laterality: Right;  . SHOULDER CLOSED REDUCTION Right 12/04/2015   Procedure: CLOSED REDUCTION SHOULDER;  Surgeon: Kyle Civil, MD;  Location: [facility];  Service: Orthopedics;  Laterality: Right;  . TOOTH EXTRACTION  spring 2016   top left     There were no vitals filed for this visit.      Subjective Assessment - 05/28/16 1306    Subjective  S:  My shoulder is a bit tight.  I may have slept on it wrong.    Currently in Pain? Yes   Pain Score 4    Pain Location Shoulder   Pain Orientation Right   Pain Descriptors / Indicators Aching   Pain Type Acute pain   Pain Onset In the past 7 days    Pain Frequency Intermittent            [facility] OT Assessment - 05/28/16 0001      Assessment   Diagnosis Right open RCR     Precautions   Precautions Shoulder   Type of Shoulder Precautions P/ROM for 4 weeks (8/22-9/19), 9-12 weeks PRE, 12-24 weeks A/ROM and PRE                  OT Treatments/Exercises (OP) - 05/28/16 0001      Exercises   Exercises Shoulder     Shoulder Exercises: Supine   Protraction PROM;5 reps;AROM;15 reps   Horizontal ABduction PROM;5 reps;AROM;15 reps   External Rotation PROM;5 reps;AROM;15 reps   Internal Rotation PROM;5 reps;AROM;15 reps   Flexion PROM;5 reps;AROM;15 reps   ABduction PROM;5 reps;AROM;15 reps     Shoulder Exercises: Standing   Protraction AROM;15 reps   Horizontal ABduction AROM;15 reps   External Rotation AROM;15 reps   Internal Rotation AROM;15 reps   Flexion AROM;15 reps   ABduction AROM;15 reps   Extension Theraband;15 reps   Theraband Level (Shoulder Extension) Level 3 (Green)   Row [employer] reps   Theraband Level (Shoulder Row) Level 3 (Green)   Retraction Theraband;15 reps   Theraband Level (  Shoulder Retraction) Level 3 (Green)     Shoulder Exercises: ROM/Strengthening   UBE (Upper Arm Bike) level 2 3' reverse and 3' forward   "W" Arms 6 times with right arm only max verbal guidance for technique    X to V Arms 10 times only able to complete with right arm isolated   Proximal Shoulder Strengthening, Supine 10 times with no rest   Proximal Shoulder Strengthening, Seated 10 times with no rest     Manual Therapy   Manual Therapy Myofascial release   Manual therapy comments Manual therapy completed prior to therapeutic exercise.    Myofascial Release Myofascial release completed to right upper arm, trapezius, and scapularis regions to decrease pain and fascial restrictions and increase joint range of motion.                   OT Short Term Goals - 05/16/16 1318      OT SHORT TERM GOAL #1    Title Patient will be educated and independent with a HEP for improving functional use of his right arm.   Time 4   Period Weeks     OT SHORT TERM GOAL #2   Title Patient will improve right shoulder P/ROM to WNL for increased functional use of right arm with daily tasks.   Time 4   Period Weeks     OT SHORT TERM GOAL #3   Title Patient will decrease intermittent pain to 4/10 in his right arm.   Time 4   Period Weeks     OT SHORT TERM GOAL #4   Title Pt will decrease fascial restrictions from mod to minimal amounts or less in RUE to improve mobility during functional tasks.    Time 4   Period Weeks           OT [name] - 05/23/16 1452      OT [name] TERM GOAL #1   Title Patient will improve right shoulder A/ROM to [facility] for improved ability to complete functional activities at shoulder level.   Time 8   Period Weeks   Status On-going     OT [name] TERM GOAL #2   Title Patient will have 4/5 strength in his right shoulder for improved ability to lift items onto his counter top.   Time 8   Period Weeks   Status On-going     OT [name] TERM GOAL #3   Title Patient will decrease fascial restrictions to trace amounts or less in his right shoulder region for greater mobility needed for daily tasks.   Time 8   Period Weeks     OT [name] TERM GOAL #4   Title Patient will decrease pain level to 5/10 or better in his right shoulder region during functional activities.    Time 8   Period Weeks     OT [name] TERM GOAL #5   Title Pt will return to prior level of functioning and independence in daily tasks using RUE as dominant.    Time 8   Period Weeks   Status On-going               Plan - 05/28/16 1414    Clinical Impression Statement A:  Patient noted increased soreness in anterior shoulder, therefore completed manual therapy this date.  Added x to v arms and w arms this date with patient requiring to complete with right arm only as he is too weak to utilize proper  form in  right arm when completing with bilateral arms.  Increased to level 2 on UBE and mod resist green theraband.  less verbal guidance required for decreasing shoulder elevation during overhead reaching.     Plan P:  Myofascial release PRN.  Add overhead lacing add sidelying flexion, abduction, protraction, horizontal abduction for improved proximal shoulder strength needed.       Patient will benefit from skilled therapeutic intervention in order to improve the following deficits and impairments:     Visit Diagnosis: Stiffness of right shoulder, not elsewhere classified  Other symptoms and signs involving the musculoskeletal system  Acute pain of right shoulder    Problem List Patient Active Problem List   Diagnosis Date Noted  . Hyperglycemia 12/04/2015  . Fracture dislocation of right shoulder joint 12/04/2015  . Dislocation, shoulder closed   . Greater tuberosity of humerus fracture   . Morbid obesity due to excess calories ([place]) 10/26/2015  . Uncontrolled type 2 diabetes mellitus without complication, without [place]-term current use of insulin ([place]) 07/16/2015  . Essential hypertension, benign 07/16/2015  . Personal history of noncompliance with medical treatment, presenting hazards to health 07/16/2015    Vangie Bicker, [place], OTR/L [phone]  05/28/2016, 2:21 PM  [place], [place], [address] Phone: [phone]   Fax:  [phone]  Name: Kyle Collins MRN: [id number] Date of Birth: Jul 24, 1986

## 2016-05-29 ENCOUNTER — Ambulatory Visit (HOSPITAL_COMMUNITY): Payer: Medicare Other | Admitting: Occupational Therapy

## 2016-05-29 ENCOUNTER — Encounter (HOSPITAL_COMMUNITY): Payer: Self-pay | Admitting: Occupational Therapy

## 2016-05-29 DIAGNOSIS — M25511 Pain in right shoulder: Secondary | ICD-10-CM | POA: Diagnosis not present

## 2016-05-29 DIAGNOSIS — R29898 Other symptoms and signs involving the musculoskeletal system: Secondary | ICD-10-CM

## 2016-05-29 DIAGNOSIS — M25611 Stiffness of right shoulder, not elsewhere classified: Secondary | ICD-10-CM

## 2016-05-29 NOTE — Therapy (Signed)
[place], [place], [address] Phone: [phone]   Fax:  [phone]  Occupational Therapy Treatment  Patient Details  Name: Kyle Collins MRN: [id number] Date of Birth: 1986-04-10 Referring Provider: Dr. Arther Abbott  Encounter Date: 05/29/2016      OT End of Session - 05/29/16 1557    Visit Number 14   Number of Visits 16   Date for OT Re-Evaluation 06/15/16   Authorization Type Medicare and Medicaid   Authorization Time Period before 20th visit   Authorization - Visit Number 14   Authorization - Number of Visits 20   OT Start Time [id number]   OT Stop Time 1429   OT Time Calculation (min) 41 min   Activity Tolerance Patient tolerated treatment well   Behavior During Therapy [facility] for tasks assessed/performed      Past Medical History:  Diagnosis Date  . Anxiety   . Bipolar disorder ([place])   . Depression   . Diabetes mellitus   . GERD (gastroesophageal reflux disease)   . Headache   . Hypertension   . Sleep apnea   . Thyroid disease     Past Surgical History:  Procedure Laterality Date  . HERNIA REPAIR     scrotum  . ORIF SHOULDER FRACTURE Right 02/28/2016   Procedure: OPEN REDUCTION INTERNAL FIXATION (ORIF) SHOULDER FRACTURE;  Surgeon: Carole Civil, MD;  Location: [facility];  Service: Orthopedics;  Laterality: Right;  . SHOULDER CLOSED REDUCTION Right 12/04/2015   Procedure: CLOSED REDUCTION SHOULDER;  Surgeon: Carole Civil, MD;  Location: [facility];  Service: Orthopedics;  Laterality: Right;  . TOOTH EXTRACTION  spring 2016   top left     There were no vitals filed for this visit.      Subjective Assessment - 05/29/16 1350    Subjective  S: It was a little bit tight today but the hot shower took care of that.    Currently in Pain? No/denies            [facility] OT Assessment - 05/29/16 1348      Assessment   Diagnosis Right open RCR     Precautions   Precautions Shoulder   Type  of Shoulder Precautions P/ROM for 4 weeks (8/22-9/19), 9-12 weeks PRE, 12-24 weeks A/ROM and PRE                  OT Treatments/Exercises (OP) - 05/29/16 1350      Exercises   Exercises Shoulder     Shoulder Exercises: Supine   Protraction PROM;5 reps;AROM;15 reps   Horizontal ABduction PROM;5 reps;AROM;15 reps   External Rotation PROM;5 reps;AROM;15 reps   Internal Rotation PROM;5 reps;AROM;15 reps   Flexion PROM;5 reps;AROM;15 reps   ABduction PROM;5 reps;AROM;15 reps     Shoulder Exercises: Sidelying   External Rotation AROM;12 reps  2 sets   Internal Rotation AROM;12 reps  2 sets   Flexion AROM;12 reps  2 sets   ABduction AROM;15 reps  2 sets   Other Sidelying Exercises protraction, A/ROM, 12X  2 sets   Other Sidelying Exercises Horizontal abduction, A/ROM, 12X  2 sets     Shoulder Exercises: Standing   Protraction AROM;15 reps   Horizontal ABduction AROM;15 reps   External Rotation AROM;15 reps   Internal Rotation AROM;15 reps   Flexion AROM;15 reps   ABduction AROM;15 reps   Extension Theraband;15 reps   Theraband Level (Shoulder Extension) Level 3 (Green)  Row Theraband;15 reps   Theraband Level (Shoulder Row) Level 3 (Green)   Retraction Theraband;15 reps   Theraband Level (Shoulder Retraction) Level 3 (Green)     Shoulder Exercises: ROM/Strengthening   UBE (Upper Arm Bike) level 2 3' reverse and 3' forward   X to V Arms 10 times only able to complete with right arm isolated   Proximal Shoulder Strengthening, Supine 12X each no rest breaks   Proximal Shoulder Strengthening, Seated 12X each no rest breaks   Ball on Wall 1' flexion 1' abduction                   OT Short Term Goals - 05/16/16 1318      OT SHORT TERM GOAL #1   Title Patient will be educated and independent with a HEP for improving functional use of his right arm.   Time 4   Period Weeks     OT SHORT TERM GOAL #2   Title Patient will improve right shoulder P/ROM  to WNL for increased functional use of right arm with daily tasks.   Time 4   Period Weeks     OT SHORT TERM GOAL #3   Title Patient will decrease intermittent pain to 4/10 in his right arm.   Time 4   Period Weeks     OT SHORT TERM GOAL #4   Title Pt will decrease fascial restrictions from mod to minimal amounts or less in RUE to improve mobility during functional tasks.    Time 4   Period Weeks           OT [place] Term Goals - 05/23/16 1452      OT Mell[place] TERM GOAL #1   Title Patient will improve right shoulder A/ROM to [facility] for improved ability to complete functional activities at shoulder level.   Time 8   Period Weeks   Status On-going     OT Coletti TERM GOAL #2   Title Patient will have 4/5 strength in his right shoulder for improved ability to lift items onto his counter top.   Time 8   Period Weeks   Status On-going     OT Setterlund TERM GOAL #3   Title Patient will decrease fascial restrictions to trace amounts or less in his right shoulder region for greater mobility needed for daily tasks.   Time 8   Period Weeks     OT Orosz TERM GOAL #4   Title Patient will decrease pain level to 5/10 or better in his right shoulder region during functional activities.    Time 8   Period Weeks     OT Hu[place] TERM GOAL #5   Title Pt will return to prior level of functioning and independence in daily tasks using RUE as dominant.    Time 8   Period Weeks   Status On-going               Plan - 05/29/16 1557    Clinical Impression Statement A: Resumed sidelying A/ROM this session, pt with improved ROM/strength in sidelying versus supine. Pt reports painful popping with abduction in supine, reports improved with sidelying, able to complete 50% range in supine and standing, 70% in sidelying. Pt requires verbal cuing for stretching to end range and for control on desent.    Plan P: Myofascial release PRN. Add overhead lacing, continue to work on correct form and gaining end range  stretch with A/ROM exercises.    OT Home Exercise  Plan 8/25: Added pendulum exercises to HEP. 10/5: AA/ROM exercises 10/20: A/ROM exercises. May stop with pendulums and AA/ROM.   Consulted and Agree with Plan of Care Patient      Patient will benefit from skilled therapeutic intervention in order to improve the following deficits and impairments:  Decreased strength, Decreased range of motion, Pain, Decreased safety awareness, Increased fascial restricitons, Impaired UE functional use  Visit Diagnosis: Stiffness of right shoulder, not elsewhere classified  Other symptoms and signs involving the musculoskeletal system  Acute pain of right shoulder    Problem List Patient Active Problem List   Diagnosis Date Noted  . Hyperglycemia 12/04/2015  . Fracture dislocation of right shoulder joint 12/04/2015  . Dislocation, shoulder closed   . Greater tuberosity of humerus fracture   . Morbid obesity due to excess calories ([place]) 10/26/2015  . Uncontrolled type 2 diabetes mellitus without complication, without [place]-term current use of insulin ([place]) 07/16/2015  . Essential hypertension, benign 07/16/2015  . Personal history of noncompliance with medical treatment, presenting hazards to health 07/16/2015   Guadelupe Sabin, OTR/L  [phone] 05/29/2016, 4:00 PM  [place], [place], [address] Phone: [phone]   Fax:  [phone]  Name: Kyle Collins MRN: [id number] Date of Birth: 24-Sep-1985

## 2016-05-30 ENCOUNTER — Other Ambulatory Visit: Payer: Self-pay | Admitting: "Endocrinology

## 2016-06-05 ENCOUNTER — Telehealth (HOSPITAL_COMMUNITY): Payer: Self-pay | Admitting: Occupational Therapy

## 2016-06-05 ENCOUNTER — Ambulatory Visit (HOSPITAL_COMMUNITY): Payer: Medicare Other | Admitting: Occupational Therapy

## 2016-06-05 NOTE — Telephone Encounter (Signed)
Pt's caregiver called to state Kyle Collins is sick today and come in today

## 2016-06-11 ENCOUNTER — Telehealth (HOSPITAL_COMMUNITY): Payer: Self-pay | Admitting: Family Medicine

## 2016-06-11 ENCOUNTER — Ambulatory Visit (HOSPITAL_COMMUNITY): Payer: Medicare Other | Admitting: Specialist

## 2016-06-11 NOTE — Telephone Encounter (Signed)
06/11/16 case worker called to cx - said that he was still sick today

## 2016-06-12 ENCOUNTER — Ambulatory Visit (HOSPITAL_COMMUNITY): Payer: Medicare Other | Admitting: Occupational Therapy

## 2016-06-12 ENCOUNTER — Telehealth (HOSPITAL_COMMUNITY): Payer: Self-pay | Admitting: Family Medicine

## 2016-06-12 NOTE — Telephone Encounter (Signed)
06/12/16 social worker cx appt because he is still sick

## 2016-06-16 ENCOUNTER — Ambulatory Visit (HOSPITAL_COMMUNITY): Payer: Medicare Other | Admitting: Specialist

## 2016-06-16 DIAGNOSIS — M25611 Stiffness of right shoulder, not elsewhere classified: Secondary | ICD-10-CM | POA: Diagnosis not present

## 2016-06-16 DIAGNOSIS — M25511 Pain in right shoulder: Secondary | ICD-10-CM

## 2016-06-16 DIAGNOSIS — R29898 Other symptoms and signs involving the musculoskeletal system: Secondary | ICD-10-CM | POA: Diagnosis not present

## 2016-06-16 NOTE — Therapy (Signed)
[place], [place], [address] Phone: [phone]   Fax:  [phone]  Occupational Therapy Treatment and recertification  Patient Details  Name: Kyle Collins MRN: [id number] Date of Birth: 11-25-1985 Referring Provider: Dr. Arther Abbott  Encounter Date: 06/16/2016      OT End of Session - 06/16/16 1007    Visit Number 15   Number of Visits 24   Date for OT Re-Evaluation 08/15/16  07/15/16   Authorization Type Medicare and Medicaid   Authorization Time Period before 25th visit   Authorization - Visit Number 15   Authorization - Number of Visits 20   OT Start Time 0950   OT Stop Time 1035   OT Time Calculation (min) 45 min      Past Medical History:  Diagnosis Date  . Anxiety   . Bipolar disorder ([place])   . Depression   . Diabetes mellitus   . GERD (gastroesophageal reflux disease)   . Headache   . Hypertension   . Sleep apnea   . Thyroid disease     Past Surgical History:  Procedure Laterality Date  . HERNIA REPAIR     scrotum  . ORIF SHOULDER FRACTURE Right 02/28/2016   Procedure: OPEN REDUCTION INTERNAL FIXATION (ORIF) SHOULDER FRACTURE;  Surgeon: Carole Civil, MD;  Location: [facility];  Service: Orthopedics;  Laterality: Right;  . SHOULDER CLOSED REDUCTION Right 12/04/2015   Procedure: CLOSED REDUCTION SHOULDER;  Surgeon: Carole Civil, MD;  Location: [facility];  Service: Orthopedics;  Laterality: Right;  . TOOTH EXTRACTION  spring 2016   top left     There were no vitals filed for this visit.          [facility] OT Assessment - 06/16/16 0001      Assessment   Diagnosis Right open RCR   Referring Provider Dr. Arther Abbott   Onset Date 02/28/16   Prior Therapy None     Precautions   Precautions Shoulder   Type of Shoulder Precautions P/ROM for 4 weeks (8/22-9/19), 9-12 weeks PRE, 12-24 weeks A/ROM and PRE     ADL   ADL comments patient is having improved independence  reaching to waist and shoulder height.  He is continuing to have difficulty reaching overhead and lifting heavy items.  reaching out to his side is also difficult.       Observation/Other Assessments   Focus on Therapeutic Outcomes (FOTO)  59/100  44/100     AROM   Overall AROM Comments assessed om seated (05/16/16)  external and internal rotation with shoulder adducted    Right Shoulder Flexion 174 Degrees  165   Right Shoulder ABduction 140 Degrees  180   Right Shoulder Internal Rotation 90 Degrees  90   Right Shoulder External Rotation 72 Degrees  70     PROM   Overall PROM Comments [facility]      Strength   Overall Strength Comments Assessed seated. IR/er adducted. Strength not assessed prior to this date.   Right Shoulder Flexion 5/5   Right Shoulder ABduction 4-/5   Right Shoulder Internal Rotation 5/5   Right Shoulder External Rotation 4-/5                  OT Treatments/Exercises (OP) - 06/16/16 0001      Exercises   Exercises Shoulder     Shoulder Exercises: Supine   Protraction PROM;5 reps   Horizontal ABduction PROM;5 reps   External  Rotation PROM;5 reps   Internal Rotation PROM;5 reps   Flexion PROM;5 reps   ABduction PROM;5 reps     Shoulder Exercises: Seated   Protraction Strengthening;10 reps   Protraction Weight (lbs) 1   Horizontal ABduction Strengthening;10 reps;Limitations   Horizontal ABduction Weight (lbs) 1   Horizontal ABduction Limitations minimal verbal guidance to depress shoulder blade during exercise   Flexion Strengthening;10 reps   Flexion Weight (lbs) 1   Abduction AROM;10 reps   Other Seated Exercises abduction A/ROM with elbow flexed to 90 10 times with less difficulty than elbow extended, abduction 90-170 with therapist providing min tactile assist to maintain position.      Shoulder Exercises: ROM/Strengthening   UBE (Upper Arm Bike) level 2 3' reverse and 3' forward   Over Head Lace 1' with 1# resistance   Proximal  Shoulder Strengthening, Seated 10 times with 1# without resting      Manual Therapy   Manual Therapy Myofascial release   Manual therapy comments Manual therapy completed prior to therapeutic exercise.    Myofascial Release Myofascial release completed to right upper arm, trapezius, and scapularis regions to decrease pain and fascial restrictions and increase joint range of motion.                   OT Short Term Goals - 06/16/16 1005      OT SHORT TERM GOAL #1   Title Patient will be educated and independent with a HEP for improving functional use of his right arm.   Time 4   Period Weeks     OT SHORT TERM GOAL #2   Title Patient will improve right shoulder P/ROM to WNL for increased functional use of right arm with daily tasks.   Time 4   Period Weeks     OT SHORT TERM GOAL #3   Title Patient will decrease intermittent pain to 4/10 in his right arm.   Time 4   Period Weeks     OT SHORT TERM GOAL #4   Title Pt will decrease fascial restrictions from mod to minimal amounts or less in RUE to improve mobility during functional tasks.    Time 4   Period Weeks           OT [place] Term Goals - 06/16/16 1005      OT [place] TERM GOAL #1   Title Patient will improve right shoulder A/ROM to [facility] for improved ability to complete functional activities at shoulder level.   Time 8   Period Weeks   Status On-going     OT [place] TERM GOAL #2   Title Patient will have 4/5 strength in his right shoulder for improved ability to lift items onto his counter top.   Time 8   Period Weeks   Status On-going     OT [place] TERM GOAL #3   Title Patient will decrease fascial restrictions to trace amounts or less in his right shoulder region for greater mobility needed for daily tasks.   Time 8   Period Weeks     OT [place] TERM GOAL #4   Title Patient will decrease pain level to 5/10 or better in his right shoulder region during functional activities.    Time 8   Period Weeks     OT  [place] TERM GOAL #5   Title Pt will return to prior level of functioning and independence in daily tasks using RUE as dominant.    Time 8  Period Weeks   Status On-going               Plan - 06/16/16 1029    Clinical Impression Statement A:  patient has met 3/5 Sauber term goals.  He continues to have deficits in A/ROM and strength, particularly external rotation and abduction that are limiting his ability to complete all daily activities using his right arm as dominant.  Patient will benefit from continued skilled OT intervention to improve A/ROM and strength to WNL in his left arm.    Rehab Potential Good   OT Frequency 2x / week   OT Duration 8 weeks   OT Treatment/Interventions Self-care/ADL training;Cryotherapy;Electrical Stimulation;Moist Heat;Ultrasound;Iontophoresis;Therapeutic exercise;Neuromuscular education;Energy conservation;DME and/or AE instruction;Passive range of motion;Manual Therapy;Therapeutic exercises;Therapeutic activities;Patient/family education   Plan P:  Focus on improving abduction and external rotation to WNL for right arm to return to dominant.        Patient will benefit from skilled therapeutic intervention in order to improve the following deficits and impairments:  Decreased strength, Decreased range of motion, Pain, Decreased safety awareness, Increased fascial restricitons, Impaired UE functional use  Visit Diagnosis: Stiffness of right shoulder, not elsewhere classified - Plan: Ot plan of care cert/re-cert  Other symptoms and signs involving the musculoskeletal system - Plan: Ot plan of care cert/re-cert  Acute pain of right shoulder - Plan: Ot plan of care cert/re-cert      G-Codes - 93/90/30 1032    Functional Assessment Tool Used FOTO scored 59/100    Functional Limitation Carrying, moving and handling objects   Carrying, Moving and Handling Objects Current Status [phone]) At least 40 percent but less than 60 percent impaired, limited or  restricted   Carrying, Moving and Handling Objects Goal Status ([id number]) At least 20 percent but less than 40 percent impaired, limited or restricted      Problem List Patient Active Problem List   Diagnosis Date Noted  . Hyperglycemia 12/04/2015  . Fracture dislocation of right shoulder joint 12/04/2015  . Dislocation, shoulder closed   . Greater tuberosity of humerus fracture   . Morbid obesity due to excess calories ([place]) 10/26/2015  . Uncontrolled type 2 diabetes mellitus without complication, without Zwicker-term current use of insulin ([place]) 07/16/2015  . Essential hypertension, benign 07/16/2015  . Personal history of noncompliance with medical treatment, presenting hazards to health 07/16/2015    Vangie Bicker, [place], OTR/L [phone]  06/16/2016, 10:56 AM  [place], [place], [address] Phone: [phone]   Fax:  [phone]  Name: BRANDUN PINN MRN: [id number] Date of Birth: [birth year]

## 2016-06-17 ENCOUNTER — Encounter (HOSPITAL_COMMUNITY): Payer: Self-pay | Admitting: Occupational Therapy

## 2016-06-17 ENCOUNTER — Ambulatory Visit (HOSPITAL_COMMUNITY): Payer: Medicare Other | Admitting: Occupational Therapy

## 2016-06-17 DIAGNOSIS — M25611 Stiffness of right shoulder, not elsewhere classified: Secondary | ICD-10-CM

## 2016-06-17 DIAGNOSIS — M25511 Pain in right shoulder: Secondary | ICD-10-CM

## 2016-06-17 DIAGNOSIS — R29898 Other symptoms and signs involving the musculoskeletal system: Secondary | ICD-10-CM

## 2016-06-17 NOTE — Therapy (Signed)
[place], [place], [address] Phone: [phone]   Fax:  [phone]  Occupational Therapy Treatment  Patient Details  Name: Kyle Collins MRN: [id number] Date of Birth: Mar 30, 1986 Referring Provider: Dr. Arther Abbott  Encounter Date: 06/17/2016      OT End of Session - 06/17/16 1616    Visit Number 16   Number of Visits 24   Date for OT Re-Evaluation 08/15/16  07/15/16   Authorization Type Medicare and Medicaid   Authorization Time Period before 25th visit   Authorization - Visit Number 16   Authorization - Number of Visits 20   OT Start Time [id number]  pt arrived late   OT Stop Time 1515   OT Time Calculation (min) 36 min      Past Medical History:  Diagnosis Date  . Anxiety   . Bipolar disorder ([place])   . Depression   . Diabetes mellitus   . GERD (gastroesophageal reflux disease)   . Headache   . Hypertension   . Sleep apnea   . Thyroid disease     Past Surgical History:  Procedure Laterality Date  . HERNIA REPAIR     scrotum  . ORIF SHOULDER FRACTURE Right 02/28/2016   Procedure: OPEN REDUCTION INTERNAL FIXATION (ORIF) SHOULDER FRACTURE;  Surgeon: Carole Civil, MD;  Location: [facility];  Service: Orthopedics;  Laterality: Right;  . SHOULDER CLOSED REDUCTION Right 12/04/2015   Procedure: CLOSED REDUCTION SHOULDER;  Surgeon: Carole Civil, MD;  Location: [facility];  Service: Orthopedics;  Laterality: Right;  . TOOTH EXTRACTION  spring 2016   top left     There were no vitals filed for this visit.      Subjective Assessment - 06/17/16 1441    Subjective  S: I keep getting stiff but I don't know what is causing that.    Currently in Pain? No/denies            [facility] OT Assessment - 06/17/16 1441      Assessment   Diagnosis Right open RCR     Precautions   Precautions Shoulder   Type of Shoulder Precautions P/ROM for 4 weeks (8/22-9/19), 9-12 weeks PRE, 12-24 weeks A/ROM and PRE                   OT Treatments/Exercises (OP) - 06/17/16 1441      Exercises   Exercises Shoulder     Shoulder Exercises: Supine   Protraction PROM;5 reps;Strengthening;10 reps   Protraction Weight (lbs) 1   Horizontal ABduction PROM;5 reps;Strengthening;10 reps   Horizontal ABduction Weight (lbs) 1   External Rotation PROM;5 reps;Strengthening;10 reps   External Rotation Weight (lbs) 1   Internal Rotation PROM;5 reps;Strengthening;10 reps   Internal Rotation Weight (lbs) 1   Flexion PROM;5 reps;Strengthening;10 reps   Shoulder Flexion Weight (lbs) 1   ABduction PROM;5 reps;Strengthening;10 reps   Shoulder ABduction Weight (lbs) 1     Shoulder Exercises: Sidelying   External Rotation AROM;15 reps   Internal Rotation AROM;15 reps   Flexion AROM;15 reps   ABduction AROM;15 reps   Other Sidelying Exercises protraction, A/ROM, 15X   Other Sidelying Exercises Horizontal abduction, A/ROM, 15X     Shoulder Exercises: Standing   Protraction Strengthening;10 reps   Protraction Weight (lbs) 1   Horizontal ABduction Strengthening;10 reps   Horizontal ABduction Weight (lbs) 1   External Rotation Strengthening;10 reps   External Rotation Weight (lbs) 1   Internal  Rotation Strengthening;10 reps   Internal Rotation Weight (lbs) 1   Flexion Strengthening;10 reps   Shoulder Flexion Weight (lbs) 1   ABduction Strengthening;10 reps   Shoulder ABduction Weight (lbs) --   ABduction Limitations using 2# dowel rod at end of A/ROM range due to weakness   Extension Theraband;15 reps   Theraband Level (Shoulder Extension) Level 3 (Green)   Row [employer] reps   Theraband Level (Shoulder Row) Level 3 (Green)   Retraction Theraband;15 reps   Theraband Level (Shoulder Retraction) Level 3 (Green)     Shoulder Exercises: ROM/Strengthening   UBE (Upper Arm Bike) level 2 2' reverse and 2' forward   X to V Arms 10X, 1#,  only able to complete with right arm isolated   Proximal  Shoulder Strengthening, Supine 12X each no rest breaks   Proximal Shoulder Strengthening, Seated 10 times with 1# without resting    Ball on Wall 1' flexion 1' abduction      Manual Therapy   Manual Therapy Myofascial release   Manual therapy comments Manual therapy completed prior to therapeutic exercise.    Myofascial Release Myofascial release completed to right upper arm, trapezius, and scapularis regions to decrease pain and fascial restrictions and increase joint range of motion.                   OT Short Term Goals - 06/16/16 1005      OT SHORT TERM GOAL #1   Title Patient will be educated and independent with a HEP for improving functional use of his right arm.   Time 4   Period Weeks     OT SHORT TERM GOAL #2   Title Patient will improve right shoulder P/ROM to WNL for increased functional use of right arm with daily tasks.   Time 4   Period Weeks     OT SHORT TERM GOAL #3   Title Patient will decrease intermittent pain to 4/10 in his right arm.   Time 4   Period Weeks     OT SHORT TERM GOAL #4   Title Pt will decrease fascial restrictions from mod to minimal amounts or less in RUE to improve mobility during functional tasks.    Time 4   Period Weeks           OT [place] Term Goals - 06/16/16 1005      OT [place] TERM GOAL #1   Title Patient will improve right shoulder A/ROM to [facility] for improved ability to complete functional activities at shoulder level.   Time 8   Period Weeks   Status On-going     OT [place] TERM GOAL #2   Title Patient will have 4/5 strength in his right shoulder for improved ability to lift items onto his counter top.   Time 8   Period Weeks   Status On-going     OT [place] TERM GOAL #3   Title Patient will decrease fascial restrictions to trace amounts or less in his right shoulder region for greater mobility needed for daily tasks.   Time 8   Period Weeks     OT [place] TERM GOAL #4   Title Patient will decrease pain level to 5/10  or better in his right shoulder region during functional activities.    Time 8   Period Weeks     OT Niemann TERM GOAL #5   Title Pt will return to prior level of functioning and independence in daily tasks using RUE  as dominant.    Time 8   Period Weeks   Status On-going               Plan - 06/17/16 1616    Clinical Impression Statement A: Pt continues to have difficulty with strength during flexion and abduction. Added 1# weight in supine and standing, pt requires consistent verbal cuing for speed, form, and technqiue. Pt uses momentum to get arm to end range, requires cuing for using strength instead of momentum.    Plan P: Continue to focus on improving abduction and er to WNL, as well as using strength instead of momentum to raise arm.    OT Home Exercise Plan 8/25: Added pendulum exercises to HEP. 10/5: AA/ROM exercises 10/20: A/ROM exercises. May stop with pendulums and AA/ROM.   Consulted and Agree with Plan of Care Patient      Patient will benefit from skilled therapeutic intervention in order to improve the following deficits and impairments:  Decreased strength, Decreased range of motion, Pain, Decreased safety awareness, Increased fascial restricitons, Impaired UE functional use  Visit Diagnosis: Stiffness of right shoulder, not elsewhere classified  Other symptoms and signs involving the musculoskeletal system  Acute pain of right shoulder      G-Codes - 2016/07/13 1032    Functional Assessment Tool Used FOTO scored 59/100    Functional Limitation Carrying, moving and handling objects   Carrying, Moving and Handling Objects Current Status [phone]) At least 40 percent but less than 60 percent impaired, limited or restricted   Carrying, Moving and Handling Objects Goal Status [id number]) At least 20 percent but less than 40 percent impaired, limited or restricted      Problem List Patient Active Problem List   Diagnosis Date Noted  . Hyperglycemia 12/04/2015  .  Fracture dislocation of right shoulder joint 12/04/2015  . Dislocation, shoulder closed   . Greater tuberosity of humerus fracture   . Morbid obesity due to excess calories ([place]) 10/26/2015  . Uncontrolled type 2 diabetes mellitus without complication, without Weiher-term current use of insulin ([place]) 07/16/2015  . Essential hypertension, benign 07/16/2015  . Personal history of noncompliance with medical treatment, presenting hazards to health 07/16/2015   [place]upe Sabin, OTR/L  [phone] 06/17/2016, 4:23 PM  [place], [place], [address] Phone: [phone]   Fax:  [phone]  Name: ANURAG LAWTON MRN: [id number] Date of Birth: [birth year]

## 2016-06-24 ENCOUNTER — Ambulatory Visit (HOSPITAL_COMMUNITY): Payer: Medicare Other | Admitting: Occupational Therapy

## 2016-06-24 ENCOUNTER — Encounter (HOSPITAL_COMMUNITY): Payer: Self-pay | Admitting: Occupational Therapy

## 2016-06-24 DIAGNOSIS — M25511 Pain in right shoulder: Secondary | ICD-10-CM | POA: Diagnosis not present

## 2016-06-24 DIAGNOSIS — M25611 Stiffness of right shoulder, not elsewhere classified: Secondary | ICD-10-CM | POA: Diagnosis not present

## 2016-06-24 DIAGNOSIS — R29898 Other symptoms and signs involving the musculoskeletal system: Secondary | ICD-10-CM

## 2016-06-24 NOTE — Therapy (Signed)
[place], [place], [address] Phone: [phone]   Fax:  [phone]  Occupational Therapy Treatment  Patient Details  Name: Kyle Collins MRN: [id number] Date of Birth: 1985/11/10 Referring Provider: Dr. Arther Abbott  Encounter Date: 06/24/2016      OT End of Session - 06/24/16 1549    Visit Number 17   Number of Visits 24   Date for OT Re-Evaluation 08/15/16  07/15/16   Authorization Type Medicare and Medicaid   Authorization Time Period before 25th visit   Authorization - Visit Number 28   Authorization - Number of Visits 20   OT Start Time 1430   OT Stop Time 1512   OT Time Calculation (min) 42 min   Activity Tolerance Patient tolerated treatment well   Behavior During Therapy [facility] for tasks assessed/performed      Past Medical History:  Diagnosis Date  . Anxiety   . Bipolar disorder ([place])   . Depression   . Diabetes mellitus   . GERD (gastroesophageal reflux disease)   . Headache   . Hypertension   . Sleep apnea   . Thyroid disease     Past Surgical History:  Procedure Laterality Date  . HERNIA REPAIR     scrotum  . ORIF SHOULDER FRACTURE Right 02/28/2016   Procedure: OPEN REDUCTION INTERNAL FIXATION (ORIF) SHOULDER FRACTURE;  Surgeon: Carole Civil, MD;  Location: [facility];  Service: Orthopedics;  Laterality: Right;  . SHOULDER CLOSED REDUCTION Right 12/04/2015   Procedure: CLOSED REDUCTION SHOULDER;  Surgeon: Carole Civil, MD;  Location: [facility];  Service: Orthopedics;  Laterality: Right;  . TOOTH EXTRACTION  spring 2016   top left     There were no vitals filed for this visit.      Subjective Assessment - 06/24/16 1429    Subjective  S: It's not getting as stiff now.    Currently in Pain? No/denies            [facility] OT Assessment - 06/24/16 1429      Assessment   Diagnosis Right open RCR     Precautions   Precautions Shoulder   Type of Shoulder Precautions  P/ROM for 4 weeks (8/22-9/19), 9-12 weeks PRE, 12-24 weeks A/ROM and PRE                  OT Treatments/Exercises (OP) - 06/24/16 1434      Exercises   Exercises Shoulder     Shoulder Exercises: Supine   Protraction PROM;5 reps;Strengthening;12 reps   Protraction Weight (lbs) 1   Horizontal ABduction PROM;5 reps;Strengthening;12 reps   Horizontal ABduction Weight (lbs) 1   External Rotation PROM;5 reps;Strengthening;12 reps   External Rotation Weight (lbs) 1   Internal Rotation PROM;5 reps;Strengthening;12 reps   Internal Rotation Weight (lbs) 1   Flexion PROM;5 reps;Strengthening;12 reps   Shoulder Flexion Weight (lbs) 1   ABduction PROM;5 reps   Shoulder ABduction Weight (lbs) --   ABduction Limitations unable to complete due to pain     Shoulder Exercises: Sidelying   External Rotation Strengthening;10 reps  2 sets   External Rotation Weight (lbs) 1   Internal Rotation Strengthening;10 reps  2 sets   Internal Rotation Weight (lbs) 1   Flexion Strengthening;10 reps  2 sets   Flexion Weight (lbs) 1   ABduction Strengthening;10 reps  2 sets   ABduction Weight (lbs) 1   Other Sidelying Exercises protraction strengthening 10X 1# weight;  2 sets   Other Sidelying Exercises Horizontal abduction, strengthening, 10X 1#; 2 sets     Shoulder Exercises: Standing   ABduction Strengthening;10 reps   ABduction Limitations using 2# dowel rod at end of A/ROM range due to weakness     Shoulder Exercises: ROM/Strengthening   Wall Pushups 10 reps   X to V Arms 10X, 1#,  only able to complete with right arm isolated   Proximal Shoulder Strengthening, Seated 10 times with 1# without resting    Ball on Wall 1' flexion 1' abduction      Manual Therapy   Manual Therapy Myofascial release   Manual therapy comments Manual therapy completed prior to therapeutic exercise.    Myofascial Release Myofascial release completed to right upper arm, trapezius, and scapularis regions to  decrease pain and fascial restrictions and increase joint range of motion.                   OT Short Term Goals - 06/16/16 1005      OT SHORT TERM GOAL #1   Title Patient will be educated and independent with a HEP for improving functional use of his right arm.   Time 4   Period Weeks     OT SHORT TERM GOAL #2   Title Patient will improve right shoulder P/ROM to WNL for increased functional use of right arm with daily tasks.   Time 4   Period Weeks     OT SHORT TERM GOAL #3   Title Patient will decrease intermittent pain to 4/10 in his right arm.   Time 4   Period Weeks     OT SHORT TERM GOAL #4   Title Pt will decrease fascial restrictions from mod to minimal amounts or less in RUE to improve mobility during functional tasks.    Time 4   Period Weeks           OT [place] Term Goals - 06/16/16 1005      OT Gruver TERM GOAL #1   Title Patient will improve right shoulder A/ROM to [facility] for improved ability to complete functional activities at shoulder level.   Time 8   Period Weeks   Status On-going     OT Sebald TERM GOAL #2   Title Patient will have 4/5 strength in his right shoulder for improved ability to lift items onto his counter top.   Time 8   Period Weeks   Status On-going     OT Quesenberry TERM GOAL #3   Title Patient will decrease fascial restrictions to trace amounts or less in his right shoulder region for greater mobility needed for daily tasks.   Time 8   Period Weeks     OT Rehm TERM GOAL #4   Title Patient will decrease pain level to 5/10 or better in his right shoulder region during functional activities.    Time 8   Period Weeks     OT Dayrit TERM GOAL #5   Title Pt will return to prior level of functioning and independence in daily tasks using RUE as dominant.    Time 8   Period Weeks   Status On-going               Plan - 06/24/16 1550    Clinical Impression Statement A: Pt reports decreased pain and tightness from last session.  During treatment session pt continues to experience pain with strengthening exercises, instructed pt to focus on form and control,  only pushing to point of discomfort, no further reports of increased pain. Pt continues to be unable to complete abduction in standing with or without weight.    Plan P: Continue focusing on strengthening, attempt theraband strengthening versus weights if pt able to tolerate.    OT Home Exercise Plan 8/25: Added pendulum exercises to HEP. 10/5: AA/ROM exercises 10/20: A/ROM exercises. May stop with pendulums and AA/ROM.   Consulted and Agree with Plan of Care Patient      Patient will benefit from skilled therapeutic intervention in order to improve the following deficits and impairments:  Decreased strength, Decreased range of motion, Pain, Decreased safety awareness, Increased fascial restricitons, Impaired UE functional use  Visit Diagnosis: Stiffness of right shoulder, not elsewhere classified  Other symptoms and signs involving the musculoskeletal system  Acute pain of right shoulder    Problem List Patient Active Problem List   Diagnosis Date Noted  . Hyperglycemia 12/04/2015  . Fracture dislocation of right shoulder joint 12/04/2015  . Dislocation, shoulder closed   . Greater tuberosity of humerus fracture   . Morbid obesity due to excess calories ([place]) 10/26/2015  . Uncontrolled type 2 diabetes mellitus without complication, without [place]-term current use of insulin ([place]) 07/16/2015  . Essential hypertension, benign 07/16/2015  . Personal history of noncompliance with medical treatment, presenting hazards to health 07/16/2015   Guadelupe Sabin, OTR/L  [phone] 06/24/2016, 3:52 PM  [place], [place], [address] Phone: [phone]   Fax:  [phone]  Name: AOUS MODEL MRN: [id number] Date of Birth: 1986-03-22

## 2016-06-26 ENCOUNTER — Encounter (HOSPITAL_COMMUNITY): Payer: Self-pay | Admitting: Occupational Therapy

## 2016-06-26 ENCOUNTER — Ambulatory Visit (HOSPITAL_COMMUNITY): Payer: Medicare Other | Admitting: Occupational Therapy

## 2016-06-26 DIAGNOSIS — M25511 Pain in right shoulder: Secondary | ICD-10-CM | POA: Diagnosis not present

## 2016-06-26 DIAGNOSIS — R29898 Other symptoms and signs involving the musculoskeletal system: Secondary | ICD-10-CM | POA: Diagnosis not present

## 2016-06-26 DIAGNOSIS — M25611 Stiffness of right shoulder, not elsewhere classified: Secondary | ICD-10-CM

## 2016-06-26 NOTE — Therapy (Signed)
[place], [place], [address] Phone: [phone]   Fax:  [phone]  Occupational Therapy Treatment  Patient Details  Name: Kyle Collins MRN: [id number] Date of Birth: Aug 02, 1985 Referring Provider: Dr. Arther Abbott  Encounter Date: 06/26/2016      OT End of Session - 06/26/16 1343    Visit Number 18   Number of Visits 24   Date for OT Re-Evaluation 08/15/16  07/15/16   Authorization Type Medicare and Medicaid   Authorization Time Period before 25th visit   Authorization - Visit Number 18   Authorization - Number of Visits 20   OT Start Time 1302   OT Stop Time 1345   OT Time Calculation (min) 43 min   Activity Tolerance Patient tolerated treatment well   Behavior During Therapy [facility] for tasks assessed/performed      Past Medical History:  Diagnosis Date  . Anxiety   . Bipolar disorder ([place])   . Depression   . Diabetes mellitus   . GERD (gastroesophageal reflux disease)   . Headache   . Hypertension   . Sleep apnea   . Thyroid disease     Past Surgical History:  Procedure Laterality Date  . HERNIA REPAIR     scrotum  . ORIF SHOULDER FRACTURE Right 02/28/2016   Procedure: OPEN REDUCTION INTERNAL FIXATION (ORIF) SHOULDER FRACTURE;  Surgeon: Carole Civil, MD;  Location: [facility];  Service: Orthopedics;  Laterality: Right;  . SHOULDER CLOSED REDUCTION Right 12/04/2015   Procedure: CLOSED REDUCTION SHOULDER;  Surgeon: Carole Civil, MD;  Location: [facility];  Service: Orthopedics;  Laterality: Right;  . TOOTH EXTRACTION  spring 2016   top left     There were no vitals filed for this visit.      Subjective Assessment - 06/26/16 1301    Subjective  S: I had a little bit of soreness after Tuesday's session.    Currently in Pain? No/denies            [facility] OT Assessment - 06/26/16 1258      Assessment   Diagnosis Right open RCR     Precautions   Precautions Shoulder   Type of  Shoulder Precautions P/ROM for 4 weeks (8/22-9/19), 9-12 weeks PRE, 12-24 weeks A/ROM and PRE                  OT Treatments/Exercises (OP) - 06/26/16 1304      Exercises   Exercises Shoulder     Shoulder Exercises: Supine   Protraction PROM;5 reps;Strengthening;12 reps   Protraction Weight (lbs) 1   Horizontal ABduction PROM;5 reps;Strengthening;12 reps   Horizontal ABduction Weight (lbs) 1   External Rotation PROM;5 reps;Strengthening;12 reps   External Rotation Weight (lbs) 1   Internal Rotation PROM;5 reps;Strengthening;12 reps   Internal Rotation Weight (lbs) 1   Flexion PROM;5 reps;Strengthening;12 reps   Shoulder Flexion Weight (lbs) 1   ABduction PROM;5 reps;AROM;12 reps     Shoulder Exercises: Sidelying   External Rotation Strengthening;12 reps   External Rotation Weight (lbs) 1   Internal Rotation Strengthening;12 reps   Internal Rotation Weight (lbs) 1   Flexion Strengthening;12 reps   Flexion Weight (lbs) 1   ABduction Strengthening;12 reps   ABduction Weight (lbs) 1   Other Sidelying Exercises protraction strengthening 12X 1# weight; 2 sets   Other Sidelying Exercises Horizontal abduction, strengthening, 12X 1#; 2 sets     Shoulder Exercises: Standing   Protraction  Theraband;12 reps   Theraband Level (Shoulder Protraction) Level 3 (Green)   Horizontal ABduction Theraband;10 reps   Theraband Level (Shoulder Horizontal ABduction) Level 3 (Green)   External Rotation Theraband;10 reps   Theraband Level (Shoulder External Rotation) Level 3 (Green)   Internal Rotation Theraband;10 reps   Theraband Level (Shoulder Internal Rotation) Level 3 (Green)   Flexion Theraband;10 reps   Theraband Level (Shoulder Flexion) Level 3 (Green)   Flexion Limitations mod difficulty reaching 50% range   ABduction Theraband;10 reps   Theraband Level (Shoulder ABduction) Level 3 (Green)   ABduction Limitations max difficulty reaching 50% range     Shoulder Exercises:  ROM/Strengthening   UBE (Upper Arm Bike) level 2 3' reverse and 3' forward   Over Head Lace 2' with 1# resistance     Manual Therapy   Manual Therapy Myofascial release   Manual therapy comments Manual therapy completed prior to therapeutic exercise.    Myofascial Release Myofascial release completed to right upper arm, trapezius, and scapularis regions to decrease pain and fascial restrictions and increase joint range of motion.                   OT Short Term Goals - 06/16/16 1005      OT SHORT TERM GOAL #1   Title Patient will be educated and independent with a HEP for improving functional use of his right arm.   Time 4   Period Weeks     OT SHORT TERM GOAL #2   Title Patient will improve right shoulder P/ROM to WNL for increased functional use of right arm with daily tasks.   Time 4   Period Weeks     OT SHORT TERM GOAL #3   Title Patient will decrease intermittent pain to 4/10 in his right arm.   Time 4   Period Weeks     OT SHORT TERM GOAL #4   Title Pt will decrease fascial restrictions from mod to minimal amounts or less in RUE to improve mobility during functional tasks.    Time 4   Period Weeks           OT [place] Term Goals - 06/16/16 1005      OT [place] TERM GOAL #1   Title Patient will improve right shoulder A/ROM to [facility] for improved ability to complete functional activities at shoulder level.   Time 8   Period Weeks   Status On-going     OT Gerlich TERM GOAL #2   Title Patient will have 4/5 strength in his right shoulder for improved ability to lift items onto his counter top.   Time 8   Period Weeks   Status On-going     OT Sivils TERM GOAL #3   Title Patient will decrease fascial restrictions to trace amounts or less in his right shoulder region for greater mobility needed for daily tasks.   Time 8   Period Weeks     OT Guedea TERM GOAL #4   Title Patient will decrease pain level to 5/10 or better in his right shoulder region during functional  activities.    Time 8   Period Weeks     OT Welle TERM GOAL #5   Title Pt will return to prior level of functioning and independence in daily tasks using RUE as dominant.    Time 8   Period Weeks   Status On-going  Plan - 06/26/16 1341    Clinical Impression Statement A: Pt reports minimal soreness after previous session. Pt able to complete A/ROM abduction in supine this session. Added green theraband exercises in standing, pt able to complete with mod/max difficulty reaching 50% range with flexion and abduction. Pt reports intermittent popping sensation at various times during treatment. Verbal cuing required for form during exercises.    Plan P: Update G-Code. Continue with strengthening exercises, theraband or weights according to pt tolerance.    OT Home Exercise Plan 8/25: Added pendulum exercises to HEP. 10/5: AA/ROM exercises 10/20: A/ROM exercises. May stop with pendulums and AA/ROM.   Consulted and Agree with Plan of Care Patient      Patient will benefit from skilled therapeutic intervention in order to improve the following deficits and impairments:  Decreased strength, Decreased range of motion, Pain, Decreased safety awareness, Increased fascial restricitons, Impaired UE functional use  Visit Diagnosis: Stiffness of right shoulder, not elsewhere classified  Other symptoms and signs involving the musculoskeletal system  Acute pain of right shoulder    Problem List Patient Active Problem List   Diagnosis Date Noted  . Hyperglycemia 12/04/2015  . Fracture dislocation of right shoulder joint 12/04/2015  . Dislocation, shoulder closed   . Greater tuberosity of humerus fracture   . Morbid obesity due to excess calories ([place]) 10/26/2015  . Uncontrolled type 2 diabetes mellitus without complication, without [place]-term current use of insulin ([place]) 07/16/2015  . Essential hypertension, benign 07/16/2015  . Personal history of noncompliance with medical  treatment, presenting hazards to health 07/16/2015   Guadelupe Sabin, OTR/L  [phone] 06/26/2016, 2:34 PM  [place], [place], [address] Phone: [phone]   Fax:  [phone]  Name: JAMIRE MACNAB MRN: [id number] Date of Birth: Mar 26, 1986

## 2016-07-01 ENCOUNTER — Encounter (HOSPITAL_COMMUNITY): Payer: Self-pay | Admitting: Occupational Therapy

## 2016-07-01 ENCOUNTER — Ambulatory Visit (HOSPITAL_COMMUNITY): Payer: Medicare Other | Attending: Orthopedic Surgery | Admitting: Occupational Therapy

## 2016-07-01 DIAGNOSIS — M25511 Pain in right shoulder: Secondary | ICD-10-CM | POA: Insufficient documentation

## 2016-07-01 DIAGNOSIS — M25611 Stiffness of right shoulder, not elsewhere classified: Secondary | ICD-10-CM | POA: Diagnosis not present

## 2016-07-01 DIAGNOSIS — R29898 Other symptoms and signs involving the musculoskeletal system: Secondary | ICD-10-CM

## 2016-07-01 NOTE — Therapy (Signed)
[place], [place], [address] Phone: [phone]   Fax:  [phone]  Occupational Therapy Treatment  Patient Details  Name: Kyle Collins MRN: [id number] Date of Birth: [birth year] Referring Provider: Dr. Arther Abbott  Encounter Date: 07/01/2016      OT End of Session - 07/01/16 1609    Visit Number 19   Number of Visits 24   Date for OT Re-Evaluation 08/15/16  07/15/16   Authorization Type Medicare and Medicaid   Authorization Time Period before 25th visit   Authorization - Visit Number 19   Authorization - Number of Visits 25   OT Start Time 1430   OT Stop Time 1512   OT Time Calculation (min) 42 min   Activity Tolerance Patient tolerated treatment well   Behavior During Therapy [facility] for tasks assessed/performed      Past Medical History:  Diagnosis Date  . Anxiety   . Bipolar disorder ([place])   . Depression   . Diabetes mellitus   . GERD (gastroesophageal reflux disease)   . Headache   . Hypertension   . Sleep apnea   . Thyroid disease     Past Surgical History:  Procedure Laterality Date  . HERNIA REPAIR     scrotum  . ORIF SHOULDER FRACTURE Right 02/28/2016   Procedure: OPEN REDUCTION INTERNAL FIXATION (ORIF) SHOULDER FRACTURE;  Surgeon: Carole Civil, MD;  Location: [facility];  Service: Orthopedics;  Laterality: Right;  . SHOULDER CLOSED REDUCTION Right 12/04/2015   Procedure: CLOSED REDUCTION SHOULDER;  Surgeon: Carole Civil, MD;  Location: [facility];  Service: Orthopedics;  Laterality: Right;  . TOOTH EXTRACTION  spring 2016   top left     There were no vitals filed for this visit.      Subjective Assessment - 07/01/16 1432    Subjective  S: It felt a lot better after last session.    Currently in Pain? No/denies            [facility] OT Assessment - 07/01/16 1429      Assessment   Diagnosis Right open RCR     Precautions   Precautions Shoulder   Type of Shoulder  Precautions P/ROM for 4 weeks (8/22-9/19), 9-12 weeks PRE, 12-24 weeks A/ROM and PRE                  OT Treatments/Exercises (OP) - 07/01/16 1434      Exercises   Exercises Shoulder     Shoulder Exercises: Supine   Protraction PROM;5 reps;Strengthening;12 reps   Protraction Weight (lbs) 1   Horizontal ABduction PROM;5 reps;Strengthening;12 reps   Horizontal ABduction Weight (lbs) 1   External Rotation PROM;5 reps;Strengthening;12 reps   External Rotation Weight (lbs) 1   Internal Rotation PROM;5 reps;Strengthening;12 reps   Internal Rotation Weight (lbs) 1   Flexion PROM;5 reps;Strengthening;12 reps   Shoulder Flexion Weight (lbs) 1   ABduction PROM;5 reps;AROM;12 reps     Shoulder Exercises: Sidelying   External Rotation Strengthening;12 reps   External Rotation Weight (lbs) 1   Internal Rotation Strengthening;12 reps   Internal Rotation Weight (lbs) 1   Flexion Strengthening;12 reps   Flexion Weight (lbs) 1   ABduction Strengthening;12 reps   ABduction Weight (lbs) 1   Other Sidelying Exercises protraction strengthening 12X 1# weight; 2 sets   Other Sidelying Exercises Horizontal abduction, strengthening, 12X 1#; 2 sets     Shoulder Exercises: Standing   Protraction Theraband;12 reps  Theraband Level (Shoulder Protraction) Level 3 (Green)   Horizontal ABduction Theraband;10 reps   Theraband Level (Shoulder Horizontal ABduction) Level 3 (Green)   External Rotation Theraband;10 reps   Theraband Level (Shoulder External Rotation) Level 3 (Green)   Internal Rotation Theraband;10 reps   Theraband Level (Shoulder Internal Rotation) Level 3 (Green)   Flexion AROM;15 reps   ABduction AROM;15 reps     Shoulder Exercises: ROM/Strengthening   Wall Pushups 15 reps   X to V Arms 10X, BUE, focusing on correct form   Proximal Shoulder Strengthening, Supine 12X, 1# weight each no rest breaks   Proximal Shoulder Strengthening, Seated 10 times without resting                    OT Short Term Goals - 06/16/16 1005      OT SHORT TERM GOAL #1   Title Patient will be educated and independent with a HEP for improving functional use of his right arm.   Time 4   Period Weeks     OT SHORT TERM GOAL #2   Title Patient will improve right shoulder P/ROM to WNL for increased functional use of right arm with daily tasks.   Time 4   Period Weeks     OT SHORT TERM GOAL #3   Title Patient will decrease intermittent pain to 4/10 in his right arm.   Time 4   Period Weeks     OT SHORT TERM GOAL #4   Title Pt will decrease fascial restrictions from mod to minimal amounts or less in RUE to improve mobility during functional tasks.    Time 4   Period Weeks           OT Victory Term Goals - 06/16/16 1005      OT [place] TERM GOAL #1   Title Patient will improve right shoulder A/ROM to [facility] for improved ability to complete functional activities at shoulder level.   Time 8   Period Weeks   Status On-going     OT [place] TERM GOAL #2   Title Patient will have 4/5 strength in his right shoulder for improved ability to lift items onto his counter top.   Time 8   Period Weeks   Status On-going     OT Empson TERM GOAL #3   Title Patient will decrease fascial restrictions to trace amounts or less in his right shoulder region for greater mobility needed for daily tasks.   Time 8   Period Weeks     OT [place]t TERM GOAL #4   Title Patient will decrease pain level to 5/10 or better in his right shoulder region during functional activities.    Time 8   Period Weeks     OT Manzella TERM GOAL #5   Title Pt will return to prior level of functioning and independence in daily tasks using RUE as dominant.    Time 8   Period Weeks   Status On-going               Plan - 07/01/16 1609    Clinical Impression Statement A: Continued with strengthening this session. Pt able to tolerate A/ROM abduction in supine and standing with improvement in range. Pt requires  max verbal and visual cuing for correct form with theraband strengthening exercises, including reduction in use of trapezius for completion. Focused on correct form and technique, and not using momentum to complete exercises. Encouraged pt to complete HEP as he reports  his shoulder is less painful after exercise completion.    Plan P: Continue with standing exercises using weights focusing on correct form.    OT Home Exercise Plan 8/25: Added pendulum exercises to HEP. 10/5: AA/ROM exercises 10/20: A/ROM exercises. May stop with pendulums and AA/ROM.   Consulted and Agree with Plan of Care Patient      Patient will benefit from skilled therapeutic intervention in order to improve the following deficits and impairments:  Decreased strength, Decreased range of motion, Pain, Decreased safety awareness, Increased fascial restricitons, Impaired UE functional use  Visit Diagnosis: Stiffness of right shoulder, not elsewhere classified  Other symptoms and signs involving the musculoskeletal system  Acute pain of right shoulder    Problem List Patient Active Problem List   Diagnosis Date Noted  . Hyperglycemia 12/04/2015  . Fracture dislocation of right shoulder joint 12/04/2015  . Dislocation, shoulder closed   . Greater tuberosity of humerus fracture   . Morbid obesity due to excess calories ([place]) 10/26/2015  . Uncontrolled type 2 diabetes mellitus without complication, without [place]-term current use of insulin ([place]) 07/16/2015  . Essential hypertension, benign 07/16/2015  . Personal history of noncompliance with medical treatment, presenting hazards to health 07/16/2015   Guadelupe Sabin, OTR/L  [phone] 07/01/2016, 4:12 PM  [place], [place], [address] Phone: [phone]   Fax:  [phone]  Name: Kyle Collins MRN: [id number] Date of Birth: 1986/03/23

## 2016-07-03 ENCOUNTER — Encounter (HOSPITAL_COMMUNITY): Payer: Self-pay | Admitting: Occupational Therapy

## 2016-07-03 ENCOUNTER — Ambulatory Visit (HOSPITAL_COMMUNITY): Payer: Medicare Other | Admitting: Occupational Therapy

## 2016-07-03 DIAGNOSIS — M25611 Stiffness of right shoulder, not elsewhere classified: Secondary | ICD-10-CM

## 2016-07-03 DIAGNOSIS — R29898 Other symptoms and signs involving the musculoskeletal system: Secondary | ICD-10-CM | POA: Diagnosis not present

## 2016-07-03 DIAGNOSIS — M25511 Pain in right shoulder: Secondary | ICD-10-CM

## 2016-07-03 NOTE — Therapy (Signed)
[place], [place], [address] Phone: [phone]   Fax:  [phone]  Occupational Therapy Treatment  Patient Details  Name: Kyle Collins MRN: [id number] Date of Birth: 01-17-1986 Referring Provider: Dr. Arther Abbott  Encounter Date: 07/03/2016      OT End of Session - 07/03/16 1112    Visit Number 20   Number of Visits 24   Date for OT Re-Evaluation 08/15/16  07/15/16   Authorization Type Medicare and Medicaid   Authorization Time Period before 25th visit   Authorization - Visit Number 20   Authorization - Number of Visits 25   OT Start Time [id number]   OT Stop Time 1111   OT Time Calculation (min) 39 min   Activity Tolerance Patient tolerated treatment well   Behavior During Therapy [facility] for tasks assessed/performed      Past Medical History:  Diagnosis Date  . Anxiety   . Bipolar disorder ([place])   . Depression   . Diabetes mellitus   . GERD (gastroesophageal reflux disease)   . Headache   . Hypertension   . Sleep apnea   . Thyroid disease     Past Surgical History:  Procedure Laterality Date  . HERNIA REPAIR     scrotum  . ORIF SHOULDER FRACTURE Right 02/28/2016   Procedure: OPEN REDUCTION INTERNAL FIXATION (ORIF) SHOULDER FRACTURE;  Surgeon: Carole Civil, MD;  Location: [facility];  Service: Orthopedics;  Laterality: Right;  . SHOULDER CLOSED REDUCTION Right 12/04/2015   Procedure: CLOSED REDUCTION SHOULDER;  Surgeon: Carole Civil, MD;  Location: [facility];  Service: Orthopedics;  Laterality: Right;  . TOOTH EXTRACTION  spring 2016   top left     There were no vitals filed for this visit.      Subjective Assessment - 07/03/16 1031    Subjective  S: It was a little stiff this morning but that's pretty much gone now.    Currently in Pain? No/denies            [facility] OT Assessment - 07/03/16 1031      Assessment   Diagnosis Right open RCR     Precautions   Precautions Shoulder    Type of Shoulder Precautions P/ROM for 4 weeks (8/22-9/19), 9-12 weeks PRE, 12-24 weeks A/ROM and PRE                  OT Treatments/Exercises (OP) - 07/03/16 1034      Exercises   Exercises Shoulder     Shoulder Exercises: Supine   Protraction PROM;5 reps;Strengthening;15 reps   Protraction Weight (lbs) 1   Horizontal ABduction PROM;5 reps;Strengthening;15 reps   Horizontal ABduction Weight (lbs) 1   External Rotation PROM;5 reps;Strengthening;15 reps   External Rotation Weight (lbs) 1   Internal Rotation PROM;5 reps;Strengthening;15 reps   Internal Rotation Weight (lbs) 1   Flexion PROM;5 reps;Strengthening;15 reps   Shoulder Flexion Weight (lbs) 1   ABduction PROM;5 reps;Strengthening;15 reps   Shoulder ABduction Weight (lbs) 1     Shoulder Exercises: Sidelying   External Rotation Strengthening;15 reps   External Rotation Weight (lbs) 1   Internal Rotation Strengthening;15 reps   Internal Rotation Weight (lbs) 1   Flexion Strengthening;15 reps   Flexion Weight (lbs) 1   ABduction Strengthening;15 reps   ABduction Weight (lbs) 1   Other Sidelying Exercises protraction strengthening 15X 1# weight; 2 sets   Other Sidelying Exercises Horizontal abduction, strengthening, 15X 1#; 2 sets  Shoulder Exercises: Standing   Protraction Strengthening;12 reps   Protraction Weight (lbs) 1   Horizontal ABduction Strengthening;12 reps   Horizontal ABduction Weight (lbs) 1   External Rotation Strengthening;12 reps   External Rotation Weight (lbs) 1   Internal Rotation Strengthening;12 reps   Internal Rotation Weight (lbs) 1   Flexion Strengthening;12 reps   Shoulder Flexion Weight (lbs) 1   ABduction Strengthening;12 reps   Shoulder ABduction Weight (lbs) 1     Shoulder Exercises: ROM/Strengthening   Wall Pushups 15 reps   "W" Arms 10X, verbal cuing for form   X to V Arms 10X, BUE, focusing on correct form   Proximal Shoulder Strengthening, Supine 15X, 1# weight  each no rest breaks   Proximal Shoulder Strengthening, Seated 15X, 1# weight, no rest breaks   Ball on Wall 1' flexion 1' abduction                   OT Short Term Goals - 06/16/16 1005      OT SHORT TERM GOAL #1   Title Patient will be educated and independent with a HEP for improving functional use of his right arm.   Time 4   Period Weeks     OT SHORT TERM GOAL #2   Title Patient will improve right shoulder P/ROM to WNL for increased functional use of right arm with daily tasks.   Time 4   Period Weeks     OT SHORT TERM GOAL #3   Title Patient will decrease intermittent pain to 4/10 in his right arm.   Time 4   Period Weeks     OT SHORT TERM GOAL #4   Title Pt will decrease fascial restrictions from mod to minimal amounts or less in RUE to improve mobility during functional tasks.    Time 4   Period Weeks           OT [place] Term Goals - 06/16/16 1005      OT Kinzler TERM GOAL #1   Title Patient will improve right shoulder A/ROM to [facility] for improved ability to complete functional activities at shoulder level.   Time 8   Period Weeks   Status On-going     OT Dusenbery TERM GOAL #2   Title Patient will have 4/5 strength in his right shoulder for improved ability to lift items onto his counter top.   Time 8   Period Weeks   Status On-going     OT Recht TERM GOAL #3   Title Patient will decrease fascial restrictions to trace amounts or less in his right shoulder region for greater mobility needed for daily tasks.   Time 8   Period Weeks     OT Deyarmin TERM GOAL #4   Title Patient will decrease pain level to 5/10 or better in his right shoulder region during functional activities.    Time 8   Period Weeks     OT Reinheimer TERM GOAL #5   Title Pt will return to prior level of functioning and independence in daily tasks using RUE as dominant.    Time 8   Period Weeks   Status On-going               Plan - 07/03/16 1113    Clinical Impression Statement A:  Continued with strengthening this session, pt able to add weight to abduction in supine and standing this session, slight improvement in range noted. Pt continues to require verbal cuing for  correct form in standing. Pt reports he is completing theraband exercises at home. Pt reports fatigue, requiring intermittent rest breaks during session.    Plan P: Continue with weights, focusing on correct form. Resume scapular theraband   OT Home Exercise Plan 8/25: Added pendulum exercises to HEP. 10/5: AA/ROM exercises 10/20: A/ROM exercises. May stop with pendulums and AA/ROM.   Consulted and Agree with Plan of Care Patient      Patient will benefit from skilled therapeutic intervention in order to improve the following deficits and impairments:  Decreased strength, Decreased range of motion, Pain, Decreased safety awareness, Increased fascial restricitons, Impaired UE functional use  Visit Diagnosis: Stiffness of right shoulder, not elsewhere classified  Other symptoms and signs involving the musculoskeletal system  Acute pain of right shoulder    Problem List Patient Active Problem List   Diagnosis Date Noted  . Hyperglycemia 12/04/2015  . Fracture dislocation of right shoulder joint 12/04/2015  . Dislocation, shoulder closed   . Greater tuberosity of humerus fracture   . Morbid obesity due to excess calories ([place]) 10/26/2015  . Uncontrolled type 2 diabetes mellitus without complication, without [place]-term current use of insulin ([place]) 07/16/2015  . Essential hypertension, benign 07/16/2015  . Personal history of noncompliance with medical treatment, presenting hazards to health 07/16/2015   Kyle Collins, OTR/L  [phone] 07/03/2016, 11:15 AM  [place], [place], [address] Phone: [phone]   Fax:  [phone]  Name: Kyle Collins MRN: [id number] Date of Birth: 1986-07-13

## 2016-07-07 ENCOUNTER — Encounter (HOSPITAL_COMMUNITY): Payer: Self-pay

## 2016-07-07 ENCOUNTER — Ambulatory Visit (HOSPITAL_COMMUNITY): Payer: Medicare Other

## 2016-07-07 DIAGNOSIS — R29898 Other symptoms and signs involving the musculoskeletal system: Secondary | ICD-10-CM

## 2016-07-07 DIAGNOSIS — M25611 Stiffness of right shoulder, not elsewhere classified: Secondary | ICD-10-CM | POA: Diagnosis not present

## 2016-07-07 DIAGNOSIS — M25511 Pain in right shoulder: Secondary | ICD-10-CM | POA: Diagnosis not present

## 2016-07-07 NOTE — Therapy (Signed)
[place], [place], [address] Phone: [phone]   Fax:  [phone]  Occupational Therapy Treatment  Patient Details  Name: Kyle Collins MRN: [id number] Date of Birth: May 22, 1986 Referring Provider: Dr. Arther Abbott  Encounter Date: 07/07/2016      OT End of Session - 07/07/16 1432    Visit Number 21   Number of Visits 24   Date for OT Re-Evaluation 08/15/16  07/15/16   Authorization Type Medicare and Medicaid   Authorization Time Period before 25th visit   Authorization - Visit Number 21   Authorization - Number of Visits 25   OT Start Time [id number]   OT Stop Time 1430   OT Time Calculation (min) 45 min   Activity Tolerance Patient tolerated treatment well   Behavior During Therapy [facility] for tasks assessed/performed      Past Medical History:  Diagnosis Date  . Anxiety   . Bipolar disorder ([place])   . Depression   . Diabetes mellitus   . GERD (gastroesophageal reflux disease)   . Headache   . Hypertension   . Sleep apnea   . Thyroid disease     Past Surgical History:  Procedure Laterality Date  . HERNIA REPAIR     scrotum  . ORIF SHOULDER FRACTURE Right 02/28/2016   Procedure: OPEN REDUCTION INTERNAL FIXATION (ORIF) SHOULDER FRACTURE;  Surgeon: Carole Civil, MD;  Location: [facility];  Service: Orthopedics;  Laterality: Right;  . SHOULDER CLOSED REDUCTION Right 12/04/2015   Procedure: CLOSED REDUCTION SHOULDER;  Surgeon: Carole Civil, MD;  Location: [facility];  Service: Orthopedics;  Laterality: Right;  . TOOTH EXTRACTION  spring 2016   top left     There were no vitals filed for this visit.      Subjective Assessment - 07/07/16 1352    Subjective  S: It's feeling really good today.   Currently in Pain? No/denies            [facility] OT Assessment - 07/07/16 1352      Assessment   Diagnosis Right open RCR     Precautions   Precautions Shoulder   Type of Shoulder Precautions  Progress as tolerated.                  OT Treatments/Exercises (OP) - 07/07/16 1352      Exercises   Exercises Shoulder     Shoulder Exercises: Supine   Protraction PROM;5 reps;Strengthening;15 reps   Protraction Weight (lbs) 1   Horizontal ABduction PROM;5 reps;Strengthening;15 reps   Horizontal ABduction Weight (lbs) 1   External Rotation PROM;5 reps;Strengthening;15 reps   External Rotation Weight (lbs) 1   Internal Rotation PROM;5 reps;Strengthening;15 reps   Internal Rotation Weight (lbs) 1   Flexion PROM;5 reps;Strengthening;15 reps   Shoulder Flexion Weight (lbs) 1   ABduction PROM;5 reps;Strengthening;15 reps   Shoulder ABduction Weight (lbs) 1     Shoulder Exercises: Sidelying   External Rotation Strengthening;15 reps   External Rotation Weight (lbs) 1   Internal Rotation Strengthening;15 reps   Internal Rotation Weight (lbs) 1   Flexion Strengthening;15 reps   Flexion Weight (lbs) 1   ABduction Strengthening;15 reps   ABduction Weight (lbs) 1   Other Sidelying Exercises protraction strengthening 15X 1# weight; 2 sets   Other Sidelying Exercises Horizontal abduction, strengthening, 15X 1#; 2 sets     Shoulder Exercises: Standing   Protraction Strengthening;12 reps   Protraction Weight (lbs) 1  Horizontal ABduction Strengthening;12 reps   Horizontal ABduction Weight (lbs) 1   External Rotation Strengthening;12 reps   External Rotation Weight (lbs) 1   Internal Rotation Strengthening;12 reps   Internal Rotation Weight (lbs) 1   Flexion Strengthening;12 reps   Theraband Level (Shoulder Flexion) Level 3 (Green)   Shoulder Flexion Weight (lbs) 1   ABduction Strengthening;12 reps   Theraband Level (Shoulder ABduction) Level 3 (Green)   Extension [employer] reps   Theraband Level (Shoulder Extension) Level 3 (Green)   Row [employer] reps   Theraband Level (Shoulder Row) Level 3 (Green)   Retraction Theraband;15 reps   Theraband Level (Shoulder  Retraction) Level 3 (Green)     Shoulder Exercises: ROM/Strengthening   Rebounder                          UBE (Upper Arm Bike) level 2 3' reverse and 3' forward   "W" Arms 10X, verbal cuing for form   X to V Arms 10X,  BUE, focusing on correct form   Proximal Shoulder Strengthening, Supine 15X, 1# weight each no rest breaks   Proximal Shoulder Strengthening, Seated 15X, 1# weight, no rest breaks   Ball on Wall 1' flexion 1' abduction green ball                  OT Short Term Goals - 06/16/16 1005      OT SHORT TERM GOAL #1   Title Patient will be educated and independent with a HEP for improving functional use of his right arm.   Time 4   Period Weeks     OT SHORT TERM GOAL #2   Title Patient will improve right shoulder P/ROM to WNL for increased functional use of right arm with daily tasks.   Time 4   Period Weeks     OT SHORT TERM GOAL #3   Title Patient will decrease intermittent pain to 4/10 in his right arm.   Time 4   Period Weeks     OT SHORT TERM GOAL #4   Title Pt will decrease fascial restrictions from mod to minimal amounts or less in RUE to improve mobility during functional tasks.    Time 4   Period Weeks           OT [place] Term Goals - 06/16/16 1005      OT [place] TERM GOAL #1   Title Patient will improve right shoulder A/ROM to [facility] for improved ability to complete functional activities at shoulder level.   Time 8   Period Weeks   Status On-going     OT Sk[place]icki TERM GOAL #2   Title Patient will have 4/5 strength in his right shoulder for improved ability to lift items onto his counter top.   Time 8   Period Weeks   Status On-going     OT [place] TERM GOAL #3   Title Patient will decrease fascial restrictions to trace amounts or less in his right shoulder region for greater mobility needed for daily tasks.   Time 8   Period Weeks     OT Rottman TERM GOAL #4   Title Patient will decrease pain level to 5/10 or better in his right shoulder region  during functional activities.    Time 8   Period Weeks     OT Klassen TERM GOAL #5   Title Pt will return to prior level of functioning and independence in daily tasks  using RUE as dominant.    Time 8   Period Weeks   Status On-going               Plan - 07/07/16 1439    Clinical Impression Statement A: Continued with strengthening and scapular theraband exercises. patient doing well with 1# weight. Reports that it is still challenging although does not appear to be during supine and sidelying exercises. VC for form and technique when needed.    Plan P: Attempt 2# weight supine and sidelying. Update HEP if needed.      Patient will benefit from skilled therapeutic intervention in order to improve the following deficits and impairments:  Decreased strength, Decreased range of motion, Pain, Decreased safety awareness, Increased fascial restricitons, Impaired UE functional use  Visit Diagnosis: Stiffness of right shoulder, not elsewhere classified  Other symptoms and signs involving the musculoskeletal system    Problem List Patient Active Problem List   Diagnosis Date Noted  . Hyperglycemia 12/04/2015  . Fracture dislocation of right shoulder joint 12/04/2015  . Dislocation, shoulder closed   . Greater tuberosity of humerus fracture   . Morbid obesity due to excess calories ([place]) 10/26/2015  . Uncontrolled type 2 diabetes mellitus without complication, without [place]-term current use of insulin ([place]) 07/16/2015  . Essential hypertension, benign 07/16/2015  . Personal history of noncompliance with medical treatment, presenting hazards to health 07/16/2015   Ailene Ravel, OTR/L,CBIS  [phone]  07/07/2016, 2:41 PM  [place], [place], [address] Phone: [phone]   Fax:  [phone]  Name: Kyle Collins MRN: [id number] Date of Birth: 1986-03-01

## 2016-07-11 ENCOUNTER — Telehealth (HOSPITAL_COMMUNITY): Payer: Self-pay | Admitting: Family Medicine

## 2016-07-11 ENCOUNTER — Ambulatory Visit (HOSPITAL_COMMUNITY): Payer: Medicare Other | Admitting: Occupational Therapy

## 2016-07-11 ENCOUNTER — Encounter (HOSPITAL_COMMUNITY): Payer: Self-pay | Admitting: Occupational Therapy

## 2016-07-11 DIAGNOSIS — R29898 Other symptoms and signs involving the musculoskeletal system: Secondary | ICD-10-CM

## 2016-07-11 DIAGNOSIS — M25611 Stiffness of right shoulder, not elsewhere classified: Secondary | ICD-10-CM | POA: Diagnosis not present

## 2016-07-11 DIAGNOSIS — M25511 Pain in right shoulder: Secondary | ICD-10-CM | POA: Diagnosis not present

## 2016-07-11 NOTE — Therapy (Signed)
[place], [place], [address] Phone: [phone]   Fax:  [phone]  Occupational Therapy Treatment  Patient Details  Name: DEMARLO BHOLA MRN: [id number] Date of Birth: 1986/06/30 Referring Provider: Dr. Arther Abbott  Encounter Date: 07/11/2016      OT End of Session - 07/11/16 1139    Visit Number 22   Number of Visits 24   Date for OT Re-Evaluation 08/15/16  07/15/16   Authorization Type Medicare and Medicaid   Authorization Time Period before 25th visit   Authorization - Visit Number 6   Authorization - Number of Visits 25   OT Start Time 1030   OT Stop Time 1114   OT Time Calculation (min) 44 min   Activity Tolerance Patient tolerated treatment well   Behavior During Therapy [facility] for tasks assessed/performed      Past Medical History:  Diagnosis Date  . Anxiety   . Bipolar disorder ([place])   . Depression   . Diabetes mellitus   . GERD (gastroesophageal reflux disease)   . Headache   . Hypertension   . Sleep apnea   . Thyroid disease     Past Surgical History:  Procedure Laterality Date  . HERNIA REPAIR     scrotum  . ORIF SHOULDER FRACTURE Right 02/28/2016   Procedure: OPEN REDUCTION INTERNAL FIXATION (ORIF) SHOULDER FRACTURE;  Surgeon: Carole Civil, MD;  Location: [facility];  Service: Orthopedics;  Laterality: Right;  . SHOULDER CLOSED REDUCTION Right 12/04/2015   Procedure: CLOSED REDUCTION SHOULDER;  Surgeon: Carole Civil, MD;  Location: [facility];  Service: Orthopedics;  Laterality: Right;  . TOOTH EXTRACTION  spring 2016   top left     There were no vitals filed for this visit.      Subjective Assessment - 07/11/16 1031    Subjective  S: For the last three times I've been here it's felt pretty good.    Currently in Pain? No/denies            [facility] OT Assessment - 07/11/16 1030      Assessment   Diagnosis Right open RCR     Precautions   Precautions Shoulder   Type of Shoulder Precautions Progress as tolerated.                  OT Treatments/Exercises (OP) - 07/11/16 1032      Exercises   Exercises Shoulder     Shoulder Exercises: Supine   Protraction PROM;5 reps;Strengthening;12 reps   Protraction Weight (lbs) 2   Horizontal ABduction PROM;5 reps;Strengthening;10 reps   Horizontal ABduction Weight (lbs) 2   External Rotation PROM;5 reps;Strengthening;10 reps   External Rotation Weight (lbs) 2   Internal Rotation PROM;5 reps;Strengthening;10 reps   Internal Rotation Weight (lbs) 2   Flexion PROM;5 reps;Strengthening;10 reps   Shoulder Flexion Weight (lbs) 2   ABduction PROM;5 reps;Strengthening;15 reps   Shoulder ABduction Weight (lbs) 1     Shoulder Exercises: Sidelying   External Rotation Strengthening;10 reps   External Rotation Weight (lbs) 2   Internal Rotation Strengthening;10 reps   Internal Rotation Weight (lbs) 2   Flexion Strengthening;10 reps   Flexion Weight (lbs) 2   ABduction Strengthening;10 reps   ABduction Weight (lbs) 2   Other Sidelying Exercises protraction strengthening 10X 2# weight; 2 sets   Other Sidelying Exercises Horizontal abduction, strengthening, 10X 2#; 2 sets     Shoulder Exercises: Standing   Protraction Strengthening;10 reps  Protraction Weight (lbs) 2   Horizontal ABduction Strengthening;10 reps   Horizontal ABduction Weight (lbs) 2   External Rotation Strengthening;10 reps   External Rotation Weight (lbs) 2   Internal Rotation Strengthening;10 reps   Internal Rotation Weight (lbs) 2   Flexion Strengthening;10 reps   Shoulder Flexion Weight (lbs) 2   ABduction Strengthening;10 reps   Shoulder ABduction Weight (lbs) 1   Extension Theraband;15 reps   Theraband Level (Shoulder Extension) Level 3 (Green)   Row [employer] reps   Theraband Level (Shoulder Row) Level 3 (Green)   Retraction Theraband;15 reps   Theraband Level (Shoulder Retraction) Level 3 (Green)     Shoulder  Exercises: ROM/Strengthening   UBE (Upper Arm Bike) level 2 2' reverse and 2' forward   Cybex Press 1.5 plate;15 reps   X to V Arms 10X, 2#  BUE, focusing on correct form   Proximal Shoulder Strengthening, Supine 15X, 2# weight each no rest breaks   Proximal Shoulder Strengthening, Seated 15X, 2# weight, no rest breaks   Ball on Wall 1.5' flexion 1.5' abduction green ball   Other ROM/Strengthening Exercises arms on fire, 40 seconds completed, 3 positions   Other ROM/Strengthening Exercises stand field goal, 10X 1#                  OT Short Term Goals - 06/16/16 1005      OT SHORT TERM GOAL #1   Title Patient will be educated and independent with a HEP for improving functional use of his right arm.   Time 4   Period Weeks     OT SHORT TERM GOAL #2   Title Patient will improve right shoulder P/ROM to WNL for increased functional use of right arm with daily tasks.   Time 4   Period Weeks     OT SHORT TERM GOAL #3   Title Patient will decrease intermittent pain to 4/10 in his right arm.   Time 4   Period Weeks     OT SHORT TERM GOAL #4   Title Pt will decrease fascial restrictions from mod to minimal amounts or less in RUE to improve mobility during functional tasks.    Time 4   Period Weeks           OT [place] Term Goals - 06/16/16 1005      OT [place] TERM GOAL #1   Title Patient will improve right shoulder A/ROM to [facility] for improved ability to complete functional activities at shoulder level.   Time 8   Period Weeks   Status On-going     OT L[place] TERM GOAL #2   Title Patient will have 4/5 strength in his right shoulder for improved ability to lift items onto his counter top.   Time 8   Period Weeks   Status On-going     OT Hansmann TERM GOAL #3   Title Patient will decrease fascial restrictions to trace amounts or less in his right shoulder region for greater mobility needed for daily tasks.   Time 8   Period Weeks     OT Bouse TERM GOAL #4   Title Patient will  decrease pain level to 5/10 or better in his right shoulder region during functional activities.    Time 8   Period Weeks     OT Lambe TERM GOAL #5   Title Pt will return to prior level of functioning and independence in daily tasks using RUE as dominant.    Time 8  Period Weeks   Status On-going               Plan - 07/11/16 1216    Clinical Impression Statement A: Increased strengthening weight to 2# this session. Pt unable to complete abduction in supine with 2# weight, otherwise able to complete with intermittent rest breaks. Added arms on fire, max difficulty completing with proper form, unable to maintain for 1'. Pt continues to require cuing for correct form during exercises and using muscle strength versus momentum for completion. Discussed completing strengthening with weights at home versus soley theraband exercises. Pt plans to complete strengthening with weights over weekend.    Plan P: Reassess for possible discharge (?). Follow up on HEP completion with weights   OT Home Exercise Plan 8/25: Added pendulum exercises to HEP. 10/5: AA/ROM exercises 10/20: A/ROM exercises. May stop with pendulums and AA/ROM.      Patient will benefit from skilled therapeutic intervention in order to improve the following deficits and impairments:  Decreased strength, Decreased range of motion, Pain, Decreased safety awareness, Increased fascial restricitons, Impaired UE functional use  Visit Diagnosis: Stiffness of right shoulder, not elsewhere classified  Other symptoms and signs involving the musculoskeletal system  Acute pain of right shoulder    Problem List Patient Active Problem List   Diagnosis Date Noted  . Hyperglycemia 12/04/2015  . Fracture dislocation of right shoulder joint 12/04/2015  . Dislocation, shoulder closed   . Greater tuberosity of humerus fracture   . Morbid obesity due to excess calories ([place]) 10/26/2015  . Uncontrolled type 2 diabetes mellitus without  complication, without [place]-term current use of insulin ([place]) 07/16/2015  . Essential hypertension, benign 07/16/2015  . Personal history of noncompliance with medical treatment, presenting hazards to health 07/16/2015   Guadelupe Sabin, OTR/L  [phone] 07/11/2016, 12:19 PM  [place], [place], [address] Phone: [phone]   Fax:  [phone]  Name: QUINNTIN MIEDEMA MRN: [id number] Date of Birth: 06/22/1986

## 2016-07-11 NOTE — Telephone Encounter (Signed)
07/11/16 social worker left a message to cancel the 1/19 appt because she can't transport him on this day

## 2016-07-14 ENCOUNTER — Ambulatory Visit (HOSPITAL_COMMUNITY): Payer: Medicare Other | Admitting: Specialist

## 2016-07-14 DIAGNOSIS — M25611 Stiffness of right shoulder, not elsewhere classified: Secondary | ICD-10-CM | POA: Diagnosis not present

## 2016-07-14 DIAGNOSIS — R29898 Other symptoms and signs involving the musculoskeletal system: Secondary | ICD-10-CM | POA: Diagnosis not present

## 2016-07-14 DIAGNOSIS — M25511 Pain in right shoulder: Secondary | ICD-10-CM | POA: Diagnosis not present

## 2016-07-14 NOTE — Therapy (Signed)
[place], [place], [address] Phone: [phone]   Fax:  [phone]  Occupational Therapy Treatment  Patient Details  Name: Kyle Collins MRN: [id number] Date of Birth: 12/31/1985 Referring Provider: Dr. Arther Abbott  Encounter Date: 07/14/2016      OT End of Session - 07/14/16 1633    Visit Number 23   Number of Visits 24   Date for OT Re-Evaluation 08/15/16   Authorization Type Medicare and Medicaid   Authorization Time Period before 25th visit   Authorization - Visit Number 23   Authorization - Number of Visits 25   OT Start Time 1430   OT Stop Time 1510   OT Time Calculation (min) 40 min   Activity Tolerance Patient tolerated treatment well   Behavior During Therapy [facility] for tasks assessed/performed      Past Medical History:  Diagnosis Date  . Anxiety   . Bipolar disorder ([place])   . Depression   . Diabetes mellitus   . GERD (gastroesophageal reflux disease)   . Headache   . Hypertension   . Sleep apnea   . Thyroid disease     Past Surgical History:  Procedure Laterality Date  . HERNIA REPAIR     scrotum  . ORIF SHOULDER FRACTURE Right 02/28/2016   Procedure: OPEN REDUCTION INTERNAL FIXATION (ORIF) SHOULDER FRACTURE;  Surgeon: Carole Civil, MD;  Location: [facility];  Service: Orthopedics;  Laterality: Right;  . SHOULDER CLOSED REDUCTION Right 12/04/2015   Procedure: CLOSED REDUCTION SHOULDER;  Surgeon: Carole Civil, MD;  Location: [facility];  Service: Orthopedics;  Laterality: Right;  . TOOTH EXTRACTION  spring 2016   top left     There were no vitals filed for this visit.      Subjective Assessment - 07/14/16 1630    Subjective  S:  I think I am more than 80% better   Special Tests FOTO 78% independent   Currently in Pain? No/denies            [facility] OT Assessment - 07/14/16 0001      Assessment   Diagnosis Right open RCR     Precautions   Precautions Shoulder   Type of Shoulder Precautions Progress as tolerated.     AROM   AROM Assessment Site Shoulder   Right/Left Shoulder Right                          OT Education - 07/14/16 1632    Education provided Yes   Education Details flexion, abduction, horizontal abduction with green and blue theraband, ball on wall foward, forward left, forward right 1 minute each   Person(s) Educated Patient   Methods Explanation;Demonstration;Handout;Verbal cues   Comprehension Verbalized understanding;Returned demonstration          OT Short Term Goals - 06/16/16 1005      OT SHORT TERM GOAL #1   Title Patient will be educated and independent with a HEP for improving functional use of his right arm.   Time 4   Period Weeks     OT SHORT TERM GOAL #2   Title Patient will improve right shoulder P/ROM to WNL for increased functional use of right arm with daily tasks.   Time 4   Period Weeks     OT SHORT TERM GOAL #3   Title Patient will decrease intermittent pain to 4/10 in his right arm.   Time  4   Period Weeks     OT SHORT TERM GOAL #4   Title Pt will decrease fascial restrictions from mod to minimal amounts or less in RUE to improve mobility during functional tasks.    Time 4   Period Weeks           OT [place] Term Goals - 07/14/16 1506      OT [place] TERM GOAL #1   Title Patient will improve right shoulder A/ROM to [facility] for improved ability to complete functional activities at shoulder level.   Time 8   Period Weeks   Status Achieved     OT [place] TERM GOAL #2   Title Patient will have 4/5 strength in his right shoulder for improved ability to lift items onto his counter top.   Time 8   Period Weeks   Status Achieved     OT [place] TERM GOAL #3   Title Patient will decrease fascial restrictions to trace amounts or less in his right shoulder region for greater mobility needed for daily tasks.   Time 8   Period Weeks   Status Achieved     OT [place] TERM GOAL #4   Title  Patient will decrease pain level to 5/10 or better in his right shoulder region during functional activities.    Time 8   Period Weeks   Status Achieved     OT [place] TERM GOAL #5   Title Pt will return to prior level of functioning and independence in daily tasks using RUE as dominant.    Time 8   Period Weeks   Status Achieved               Plan - 07/14/16 1638    Clinical Impression Statement A:  Patient has met all OT goals.  While his strength is 4+/5, he is able to complete all functional activities independently.  Patient will continue to focus on strengthening of his right upper extremity at home with updated HEP as well as activities completed during session at home.  Issued patient a free trial to [facility] for continued strengthening.     Plan P: discharge from skilled OT intervention this date, as patient has met all OT goals and is pleased with his current functional status.  Patient will continue with HEP for strengthening.    Consulted and Agree with Plan of Care Patient      Patient will benefit from skilled therapeutic intervention in order to improve the following deficits and impairments:  Decreased strength, Decreased range of motion, Pain, Decreased safety awareness, Increased fascial restricitons, Impaired UE functional use  Visit Diagnosis: Stiffness of right shoulder, not elsewhere classified  Other symptoms and signs involving the musculoskeletal system  Acute pain of right shoulder    Problem List Patient Active Problem List   Diagnosis Date Noted  . Hyperglycemia 12/04/2015  . Fracture dislocation of right shoulder joint 12/04/2015  . Dislocation, shoulder closed   . Greater tuberosity of humerus fracture   . Morbid obesity due to excess calories (HCC) 10/26/2015  . Uncontrolled type 2 diabetes mellitus without complication, without [place]-term current use of insulin (HCC) 07/16/2015  . Essential hypertension, benign 07/16/2015  . Personal history of  noncompliance with medical treatment, presenting hazards to health 07/16/2015    Shirlean Mylar, MHA, OTR/L [phone] OCCUPATIONAL THERAPY DISCHARGE SUMMARY  Visits from Start of Care: 23 Current functional level related to goals / functional outcomes: Independent with all functional activities using  right arm as dominant.    Remaining deficits: Strength in right shoulder 4+/5    Education / Equipment: Right shoulder strengthening Plan: Patient agrees to discharge.  Patient goals were partially met. Patient is being discharged due to meeting the stated rehab goals.  ?????      07/14/2016, 4:42 PM  Vangie Bicker, [place], OTR/L [place] [address] [place], [place], [address] Phone: [phone]   Fax:  [phone]  Name: Kyle Collins MRN: [id number] Date of Birth: Jun 11, 1986

## 2016-07-14 NOTE — Patient Instructions (Signed)
FLEXION: Standing - Unstable: Resistance Band (Active)    Stand on foam with right arm down. Against yellow resistance band, lift arm forward and up as high as possible, keeping elbow straight. Complete ___ sets of ___ repetitions. Perform ___ sessions per day.  Copyright  VHI. All rights reserved.  ABDUCTION: Sitting - Resistance Band (Active)    Sit with right arm down. Against yellow resistance band, lift arm out to side and up as high as possible, keeping elbow straight. Complete ___ sets of ___ repetitions. Perform ___ sessions per day.  Copyright  VHI. All rights reserved.  (Home) Flexion: Horizontal Abduction (Multiple Resist)    Opposite side toward anchor, strap around left wrist, smaller weights in same hand. Lift arms forward, thumbs up. Do not allow weight to draw arm across body. Repeat ____ times per set. Do ____ sets per session. Do ____ sessions per week. Use ____ lb weights.  Copyright  VHI. All rights reser (Home) Extension: Isometric / Bilateral Arm Retraction - Sitting   Facing anchor, hold hands and elbow at shoulder height, with elbow bent.  Pull arms back to squeeze shoulder blades together. Repeat 10-15 times.  Copyright  VHI. All rights reserved.   (Home) Retraction: Row - Bilateral (Anchor)   Facing anchor, arms reaching forward, pull hands toward stomach, keeping elbows bent and at your sides and pinching shoulder blades together. Repeat 10-15 times.  Copyright  VHI. All rights reserved.   (Clinic) Extension / Flexion (Assist)   Face anchor, pull arms back, keeping elbow straight, and squeze shoulder blades together. Repeat 10-15 times.   Copyright  VHI. All rights reserved.  Gymball on Wall: Clockwise / Counterclockwise    Stand ___ feet from wall with right hand supporting a ball on the wall. Lean into ball and move ball in circles clockwise. Circle ___ times. Repeat with other arm for set. Rest ___ seconds after set. Do ___ sets  per session.  http://plyo.exer.us/180   Copyright  VHI. All rights reserved.

## 2016-07-16 ENCOUNTER — Ambulatory Visit (HOSPITAL_COMMUNITY): Payer: Medicare Other | Admitting: Specialist

## 2016-07-17 ENCOUNTER — Encounter (HOSPITAL_COMMUNITY): Payer: Medicare Other | Admitting: Specialist

## 2016-07-30 ENCOUNTER — Ambulatory Visit (HOSPITAL_COMMUNITY): Payer: Medicare Other | Admitting: Occupational Therapy

## 2016-07-31 DIAGNOSIS — H5213 Myopia, bilateral: Secondary | ICD-10-CM | POA: Diagnosis not present

## 2016-07-31 DIAGNOSIS — E119 Type 2 diabetes mellitus without complications: Secondary | ICD-10-CM | POA: Diagnosis not present

## 2016-07-31 DIAGNOSIS — H52223 Regular astigmatism, bilateral: Secondary | ICD-10-CM | POA: Diagnosis not present

## 2016-08-01 ENCOUNTER — Ambulatory Visit (HOSPITAL_COMMUNITY): Payer: Medicare Other

## 2016-08-04 ENCOUNTER — Ambulatory Visit (INDEPENDENT_AMBULATORY_CARE_PROVIDER_SITE_OTHER): Payer: Medicare Other | Admitting: "Endocrinology

## 2016-08-04 ENCOUNTER — Encounter (HOSPITAL_COMMUNITY): Payer: Medicare Other

## 2016-08-04 ENCOUNTER — Encounter: Payer: Self-pay | Admitting: "Endocrinology

## 2016-08-04 VITALS — BP 140/94 | HR 91 | Ht 73.0 in | Wt 354.0 lb

## 2016-08-04 DIAGNOSIS — E1165 Type 2 diabetes mellitus with hyperglycemia: Secondary | ICD-10-CM

## 2016-08-04 DIAGNOSIS — IMO0001 Reserved for inherently not codable concepts without codable children: Secondary | ICD-10-CM

## 2016-08-04 DIAGNOSIS — I10 Essential (primary) hypertension: Secondary | ICD-10-CM

## 2016-08-04 DIAGNOSIS — Z9119 Patient's noncompliance with other medical treatment and regimen: Secondary | ICD-10-CM | POA: Diagnosis not present

## 2016-08-04 DIAGNOSIS — Z91199 Patient's noncompliance with other medical treatment and regimen due to unspecified reason: Secondary | ICD-10-CM

## 2016-08-04 MED ORDER — HYDROCHLOROTHIAZIDE 25 MG PO TABS: 25.0000 mg | ORAL_TABLET | Freq: Every day | ORAL | 6 refills | Status: DC

## 2016-08-04 NOTE — Patient Instructions (Signed)
Advice for weight management -For most of us the best way to lose weight is by diet management. Generally speaking, diet management means restricting carbohydrate consumption to minimum possible (and to unprocessed or minimally processed complex starch) and increasing protein intake (animal or plant source), fruits, and vegetables.  -Sticking to a routine mealtime to eat 3 meals a day and avoiding unnecessary snacks is shown to have a big role in weight control.  -It is better to avoid simple carbohydrates including: Cakes, Desserts, Ice Cream, Soda (diet and regular), Sweet Tea, Candies, Chips, Cookies, Artificial Sweeteners, and "Sugar-free" Products.   -Exercise: 30 minutes a day 3-4 days a week, or 150 minutes a week. Combine stretch, strength, and aerobic activities. You may seek evaluation by your heart doctor prior to initiating exercise if you have high risk for heart disease.  -If you are interested, we can schedule a visit with Kyle Collins, RDN, CDE for individualized nutrition education.  

## 2016-08-04 NOTE — Progress Notes (Signed)
Subjective:    Patient ID: Kyle Collins, male    DOB: Dec 06, 1985, PCP Deloria Lair, MD   Past Medical History:  Diagnosis Date  . Anxiety   . Bipolar disorder ([place])   . Depression   . Diabetes mellitus   . GERD (gastroesophageal reflux disease)   . Headache   . Hypertension   . Sleep apnea   . Thyroid disease    Past Surgical History:  Procedure Laterality Date  . HERNIA REPAIR     scrotum  . ORIF SHOULDER FRACTURE Right 02/28/2016   Procedure: OPEN REDUCTION INTERNAL FIXATION (ORIF) SHOULDER FRACTURE;  Surgeon: Carole Civil, MD;  Location: [facility];  Service: Orthopedics;  Laterality: Right;  . SHOULDER CLOSED REDUCTION Right 12/04/2015   Procedure: CLOSED REDUCTION SHOULDER;  Surgeon: Carole Civil, MD;  Location: [facility];  Service: Orthopedics;  Laterality: Right;  . TOOTH EXTRACTION  spring 2016   top left    Social History   Social History  . Marital status: Single    Spouse name: N/A  . Number of children: N/A  . Years of education: N/A   Social History Main Topics  . Smoking status: Former Smoker    Packs/day: 0.50    Years: 5.00    Types: E-cigarettes    Quit date: 02/24/2009  . Smokeless tobacco: Never Used  . Alcohol use Yes     Comment: once year  . Drug use: No  . Sexual activity: No   Other Topics Concern  . None   Social History Narrative  . None   Outpatient Encounter Prescriptions as of 08/04/2016  Medication Sig  . escitalopram (LEXAPRO) 20 MG tablet Take 20 mg by mouth daily.  . hydrochlorothiazide (HYDRODIURIL) 25 MG tablet Take 1 tablet (25 mg total) by mouth daily.  Marland Kitchen HYDROcodone-acetaminophen (NORCO) 10-325 MG tablet Take 1 tablet by mouth every 4 (four) hours as needed.  Marland Kitchen ibuprofen (ADVIL,MOTRIN) 800 MG tablet Take 1 tablet (800 mg total) by mouth every 8 (eight) hours as needed.  . INVOKANA 100 MG TABS tablet TAKE ONE TABLET BY MOUTH DAILY BEFORE BREAKFAST.  Marland Kitchen lisinopril (PRINIVIL,ZESTRIL) 40 MG tablet Take 1 tablet (40 mg  total) by mouth daily.  . metFORMIN (GLUCOPHAGE-XR) 500 MG 24 hr tablet Take 1,000 mg by mouth daily after breakfast.  . traZODone (DESYREL) 150 MG tablet Take 75-150 mg by mouth at bedtime as needed for sleep.   . TURMERIC PO Take 1 tablet by mouth daily.  . [DISCONTINUED] hydrochlorothiazide (HYDRODIURIL) 25 MG tablet Take 1 tablet (25 mg total) by mouth daily.   No facility-administered encounter medications on file as of 08/04/2016.    ALLERGIES: Allergies  Allergen Reactions  . Methylphenidate Derivatives Other (See Comments)    Patient states it made him "act like a zombie" when given as a child  . Ritalin [Methylphenidate Hcl]    VACCINATION STATUS:  There is no immunization history on file for this patient.  Diabetes  He presents for his follow-up diabetic visit. He has type 2 diabetes mellitus. Onset time: Diagnosed at approximate age of 64 years. His disease course has been improving. There are no hypoglycemic associated symptoms. Pertinent negatives for hypoglycemia include no confusion, headaches, pallor or seizures. Associated symptoms include polydipsia, polyuria and visual change. Pertinent negatives for diabetes include no chest pain, no fatigue, no polyphagia and no weakness. There are no hypoglycemic complications. Symptoms are improving. There are no diabetic complications. Risk factors for coronary artery  disease include diabetes mellitus, hypertension, male sex, obesity, sedentary lifestyle and tobacco exposure. Current diabetic treatment includes oral agent (monotherapy). He is compliant with treatment some of the time. His weight is decreasing steadily. He is following a generally unhealthy diet. When asked about meal planning, he reported none. He has not had a previous visit with a dietitian (He missed his appointment with the dietitian.). Home blood sugar record trend: Earlier documented hypoglycemia on his log and meter are likely erroneous since he did not have any  symptoms during those readings. His breakfast blood glucose range is generally 180-200 mg/dl. His lunch blood glucose range is generally 180-200 mg/dl. His dinner blood glucose range is generally 180-200 mg/dl. His overall blood glucose range is 180-200 mg/dl.  Hypertension  This is a chronic problem. The current episode started more than 1 year ago. Pertinent negatives include no chest pain, headaches, neck pain, palpitations or shortness of breath. Past treatments include ACE inhibitors.     Review of Systems  Constitutional: Negative for chills, fatigue, fever and unexpected weight change.  HENT: Negative for dental problem, mouth sores and trouble swallowing.   Eyes: Negative for visual disturbance.  Respiratory: Negative for cough, choking, chest tightness, shortness of breath and wheezing.   Cardiovascular: Negative for chest pain, palpitations and leg swelling.  Gastrointestinal: Negative for abdominal distention, abdominal pain, constipation, diarrhea, nausea and vomiting.  Endocrine: Positive for polydipsia and polyuria. Negative for polyphagia.  Genitourinary: Negative for dysuria, flank pain, hematuria and urgency.  Musculoskeletal: Negative for back pain, gait problem, myalgias and neck pain.  Skin: Negative for pallor, rash and wound.  Neurological: Negative for seizures, syncope, weakness, numbness and headaches.  Psychiatric/Behavioral: Negative.  Negative for confusion and dysphoric mood.    Objective:    BP (!) 140/94   Pulse 91   Ht 6\' 1"  (1.854 m)   Wt (!) 354 lb (160.6 kg)   BMI 46.70 kg/m   Wt Readings from Last 3 Encounters:  08/04/16 (!) 354 lb (160.6 kg)  03/13/16 (!) 347 lb (157.4 kg)  03/04/16 (!) 347 lb (157.4 kg)    Physical Exam  Constitutional: He is oriented to person, place, and time. He appears well-developed and well-nourished. He is cooperative. No distress.  HENT:  Head: Normocephalic and atraumatic.  Eyes: EOM are normal.  Neck: Normal range  of motion. Neck supple. No tracheal deviation present. No thyromegaly present.  Cardiovascular: Normal rate, S1 normal, S2 normal and normal heart sounds.  Exam reveals no gallop.   No murmur heard. Pulses:      Dorsalis pedis pulses are 1+ on the right side, and 1+ on the left side.       Posterior tibial pulses are 1+ on the right side, and 1+ on the left side.  Pulmonary/Chest: Breath sounds normal. No respiratory distress. He has no wheezes.  Abdominal: Soft. Bowel sounds are normal. He exhibits no distension. There is no tenderness. There is no guarding and no CVA tenderness.  Musculoskeletal: He exhibits no edema.       Right shoulder: He exhibits no swelling and no deformity.  Neurological: He is alert and oriented to person, place, and time. He has normal strength and normal reflexes. No cranial nerve deficit or sensory deficit. Gait normal.  Skin: Skin is warm and dry. No rash noted. No cyanosis. Nails show no clubbing.  Psychiatric: His speech is normal. Cognition and memory are normal.  Noncompliant, defensive and reluctant affect.     Complete Blood  Count (Most recent): Lab Results  Component Value Date   WBC 6.7 02/25/2016   HGB 15.0 02/25/2016   HCT 45.0 02/25/2016   MCV 89.1 02/25/2016   PLT 252 02/25/2016   Chemistry (most recent): Lab Results  Component Value Date   NA 132 (L) 02/25/2016   K 4.2 02/25/2016   CL 101 02/25/2016   CO2 25 02/25/2016   BUN 16 02/25/2016   CREATININE 0.64 02/25/2016    Lab Results  Component Value Date   HGBA1C 10.3 (H) 10/09/2015   HGBA1C 11.4 (H) 06/07/2015     Assessment & Plan:   1. Uncontrolled type 2 diabetes mellitus without complication, without [place]-term current use of insulin ([place]   - His diabetes is  complicated by noncompliance and patient remains at a high risk for more acute and chronic complications of diabetes which include CAD, CVA, CKD, retinopathy, and neuropathy. These are all discussed in detail with  the patient.  Patient came with no meter nor logs. And he has missed his last appointment. -His repeat labs show improved A1c of 8.4%, generally improving from 11.4%.   Recent labs reviewed.   - I have re-counseled the patient on diet management and weight loss  by adopting a carbohydrate restricted / protein rich  Diet.  - Suggestion is made for patient to avoid simple carbohydrates   from their diet including Cakes , Desserts, Ice Cream,  Soda (  diet and regular) , Sweet Tea , Candies,  Chips, Cookies, Artificial Sweeteners,   and "Sugar-free" Products .  This will help patient to have stable blood glucose profile and potentially avoid unintended  Weight gain.  - Patient is advised to stick to a routine mealtimes to eat 3 meals  a day and avoid unnecessary snacks ( to snack only to correct hypoglycemia).  - The patient  has been  scheduled with Jearld Fenton, RDN, CDE for individualized DM education. He did not show up for appointment, will reschedule.  - I have approached patient with the following individualized plan to manage diabetes and patient agrees.  - Based on his significantly  improved A1c to 8.4% and his major hesitance to go on injectable therapy, he will be continued on none insulin therapy for now.   -I will continue MTF 1000mg   ER po daily after breakfast, and continue Invokana 100mg  po qday.  - Patient specific target  for A1c; LDL, HDL, Triglycerides, and  Waist Circumference were discussed in detail.  2) BP/HTN: Uncontrolled. Continue current medications including ACEI/ARB. I added HCTZ 25mg  po qday.  3)  Weight/Diet:  He did reluctantly accepted CDE consult, however he did not follow through. he did not keep appointment. he is not motivated to exercise. - carbohydrates information provided.  4. Personal history of noncompliance with medical treatment, presenting hazards to health -I tried to counsel this patient however he is frankly unconcerned and does not  want to take any responsibility.  5) Chronic Care/Health Maintenance:  -Patient  on ACEI/ARB  and encouraged to continue to follow up with Ophthalmology, Podiatrist at least yearly or according to recommendations, and advised to  quit smoking. I have recommended yearly flu vaccine and pneumonia vaccination at least every 5 years; moderate intensity exercise for up to 150 minutes weekly; and  sleep for at least 7 hours a day. -He is cleared from diabetes point of view for the planned shoulder surgery.  - 25 minutes of time was spent on the care of this patient ,  50% of which was applied for counseling on diabetes complications and their preventions.  - I advised patient to maintain close follow up with TAPPER,DAVID B, MD for primary care needs.  Patient is asked to bring meter and  blood glucose logs during their next visit.   Follow up plan: -Return in about 3 months (around 11/02/2016) for meter, and logs.  Glade Lloyd, MD Phone: [phone]  Fax: [phone]   08/04/2016, 10:12 AM

## 2016-08-07 ENCOUNTER — Encounter (HOSPITAL_COMMUNITY): Payer: Medicare Other

## 2016-08-11 ENCOUNTER — Other Ambulatory Visit: Payer: Self-pay

## 2016-08-11 MED ORDER — BLOOD GLUCOSE MONITOR KIT: PACK | 0 refills | Status: DC

## 2016-08-13 ENCOUNTER — Encounter (HOSPITAL_COMMUNITY): Payer: Medicare Other | Admitting: Occupational Therapy

## 2016-08-15 ENCOUNTER — Encounter (HOSPITAL_COMMUNITY): Payer: Medicare Other | Admitting: Occupational Therapy

## 2016-08-19 ENCOUNTER — Encounter (HOSPITAL_COMMUNITY): Payer: Medicare Other

## 2016-08-21 ENCOUNTER — Encounter (HOSPITAL_COMMUNITY): Payer: Medicare Other

## 2016-08-25 ENCOUNTER — Encounter (HOSPITAL_COMMUNITY): Payer: Medicare Other

## 2016-08-27 ENCOUNTER — Encounter (HOSPITAL_COMMUNITY): Payer: Medicare Other

## 2016-09-25 DIAGNOSIS — J339 Nasal polyp, unspecified: Secondary | ICD-10-CM | POA: Diagnosis not present

## 2016-09-25 DIAGNOSIS — E1165 Type 2 diabetes mellitus with hyperglycemia: Secondary | ICD-10-CM | POA: Diagnosis not present

## 2016-09-25 DIAGNOSIS — I10 Essential (primary) hypertension: Secondary | ICD-10-CM | POA: Diagnosis not present

## 2016-09-25 DIAGNOSIS — N471 Phimosis: Secondary | ICD-10-CM | POA: Diagnosis not present

## 2016-09-26 ENCOUNTER — Other Ambulatory Visit: Payer: Self-pay | Admitting: "Endocrinology

## 2016-11-19 ENCOUNTER — Ambulatory Visit: Payer: Medicare Other | Admitting: Urology

## 2017-02-04 ENCOUNTER — Ambulatory Visit (INDEPENDENT_AMBULATORY_CARE_PROVIDER_SITE_OTHER): Payer: Medicare Other | Admitting: Urology

## 2017-02-04 DIAGNOSIS — N471 Phimosis: Secondary | ICD-10-CM

## 2017-02-06 ENCOUNTER — Other Ambulatory Visit: Payer: Self-pay | Admitting: "Endocrinology

## 2017-03-02 ENCOUNTER — Telehealth: Payer: Self-pay | Admitting: *Deleted

## 2017-03-02 ENCOUNTER — Other Ambulatory Visit: Payer: Self-pay | Admitting: "Endocrinology

## 2017-03-02 DIAGNOSIS — E1110 Type 2 diabetes mellitus with ketoacidosis without coma: Secondary | ICD-10-CM

## 2017-03-02 LAB — COMPLETE METABOLIC PANEL WITH GFR
ALBUMIN: 4.1 g/dL (ref 3.6–5.1)
ALK PHOS: 75 U/L (ref 40–115)
ALT: 21 U/L (ref 9–46)
AST: 12 U/L (ref 10–40)
BILIRUBIN TOTAL: 1.1 mg/dL (ref 0.2–1.2)
BUN: 13 mg/dL (ref 7–25)
CALCIUM: 9.1 mg/dL (ref 8.6–10.3)
CO2: 26 mmol/L (ref 20–32)
Chloride: 96 mmol/L — ABNORMAL LOW (ref 98–110)
Creat: 0.65 mg/dL (ref 0.60–1.35)
Glucose, Bld: 289 mg/dL — ABNORMAL HIGH (ref 65–99)
Potassium: 4.2 mmol/L (ref 3.5–5.3)
Sodium: 136 mmol/L (ref 135–146)
TOTAL PROTEIN: 6.7 g/dL (ref 6.1–8.1)

## 2017-03-02 NOTE — Telephone Encounter (Signed)
Labs Expired--

## 2017-03-03 ENCOUNTER — Ambulatory Visit: Payer: Medicare Other | Admitting: "Endocrinology

## 2017-03-03 LAB — HEMOGLOBIN A1C
HEMOGLOBIN A1C: 10.3 % — AB (ref <5.7)
MEAN PLASMA GLUCOSE: 249 mg/dL

## 2017-03-04 ENCOUNTER — Ambulatory Visit (INDEPENDENT_AMBULATORY_CARE_PROVIDER_SITE_OTHER): Payer: Medicare Other | Admitting: Urology

## 2017-03-04 DIAGNOSIS — N471 Phimosis: Secondary | ICD-10-CM | POA: Diagnosis not present

## 2017-03-05 ENCOUNTER — Ambulatory Visit (INDEPENDENT_AMBULATORY_CARE_PROVIDER_SITE_OTHER): Payer: Medicare Other | Admitting: "Endocrinology

## 2017-03-05 ENCOUNTER — Encounter: Payer: Self-pay | Admitting: "Endocrinology

## 2017-03-05 VITALS — BP 144/98 | HR 94 | Wt 343.0 lb

## 2017-03-05 DIAGNOSIS — Z9119 Patient's noncompliance with other medical treatment and regimen: Secondary | ICD-10-CM | POA: Diagnosis not present

## 2017-03-05 DIAGNOSIS — I10 Essential (primary) hypertension: Secondary | ICD-10-CM

## 2017-03-05 DIAGNOSIS — E1110 Type 2 diabetes mellitus with ketoacidosis without coma: Secondary | ICD-10-CM | POA: Diagnosis not present

## 2017-03-05 DIAGNOSIS — Z91199 Patient's noncompliance with other medical treatment and regimen due to unspecified reason: Secondary | ICD-10-CM

## 2017-03-05 MED ORDER — EMPAGLIFLOZIN 10 MG PO TABS: 10.0000 mg | ORAL_TABLET | Freq: Every day | ORAL | 3 refills | Status: DC

## 2017-03-05 NOTE — Patient Instructions (Signed)

## 2017-03-05 NOTE — Progress Notes (Signed)
Subjective:    Patient ID: Kyle Collins, male    DOB: [birth year], PCP Deloria Lair., MD   Past Medical History:  Diagnosis Date  . Anxiety   . Bipolar disorder ([place])   . Depression   . Diabetes mellitus   . GERD (gastroesophageal reflux disease)   . Headache   . Hypertension   . Sleep apnea   . Thyroid disease    Past Surgical History:  Procedure Laterality Date  . HERNIA REPAIR     scrotum  . ORIF SHOULDER FRACTURE Right 02/28/2016   Procedure: OPEN REDUCTION INTERNAL FIXATION (ORIF) SHOULDER FRACTURE;  Surgeon: Carole Civil, MD;  Location: [facility];  Service: Orthopedics;  Laterality: Right;  . SHOULDER CLOSED REDUCTION Right 12/04/2015   Procedure: CLOSED REDUCTION SHOULDER;  Surgeon: Carole Civil, MD;  Location: [facility];  Service: Orthopedics;  Laterality: Right;  . TOOTH EXTRACTION  spring 2016   top left    Social History   Social History  . Marital status: Single    Spouse name: N/A  . Number of children: N/A  . Years of education: N/A   Social History Main Topics  . Smoking status: Former Smoker    Packs/day: 0.50    Years: 5.00    Types: E-cigarettes    Quit date: 02/24/2009  . Smokeless tobacco: Never Used  . Alcohol use Yes     Comment: once year  . Drug use: No  . Sexual activity: No   Other Topics Concern  . None   Social History Narrative  . None   Outpatient Encounter Prescriptions as of 03/05/2017  Medication Sig  . blood glucose meter kit and supplies KIT Dispense based on patient and insurance preference. Use up to 2 times daily as directed. (FOR ICD-10 E11.65)  . escitalopram (LEXAPRO) 20 MG tablet Take 20 mg by mouth daily.  . hydrochlorothiazide (HYDRODIURIL) 25 MG tablet Take 1 tablet (25 mg total) by mouth daily.  Marland Kitchen HYDROcodone-acetaminophen (NORCO) 10-325 MG tablet Take 1 tablet by mouth every 4 (four) hours as needed.  Marland Kitchen ibuprofen (ADVIL,MOTRIN) 800 MG tablet Take 1 tablet (800 mg total) by mouth every 8 (eight) hours as  needed.  . LamoTRIgine (LAMICTAL PO) Take by mouth 4 (four) times daily.  Marland Kitchen lisinopril (PRINIVIL,ZESTRIL) 40 MG tablet Take 1 tablet (40 mg total) by mouth daily.  . metFORMIN (GLUCOPHAGE-XR) 500 MG 24 hr tablet Take 1,000 mg by mouth daily after breakfast.  . traZODone (DESYREL) 150 MG tablet Take 75-150 mg by mouth at bedtime as needed for sleep.   . TURMERIC PO Take 1 tablet by mouth daily.  . [DISCONTINUED] INVOKANA 100 MG TABS tablet TAKE ONE TABLET BY MOUTH DAILY BEFORE BREAKFAST.  Marland Kitchen empagliflozin (JARDIANCE) 10 MG TABS tablet Take 10 mg by mouth daily.   No facility-administered encounter medications on file as of 03/05/2017.    ALLERGIES: Allergies  Allergen Reactions  . Methylphenidate Derivatives Other (See Comments)    Patient states it made him "act like a zombie" when given as a child  . Ritalin [Methylphenidate Hcl]    VACCINATION STATUS:  There is no immunization history on file for this patient.  Diabetes  He presents for his follow-up diabetic visit. He has type 2 diabetes mellitus. Onset time: Diagnosed at approximate age of 60 years. His disease course has been worsening. There are no hypoglycemic associated symptoms. Pertinent negatives for hypoglycemia include no confusion, headaches, pallor or seizures. Associated symptoms include  polydipsia, polyuria and visual change. Pertinent negatives for diabetes include no chest pain, no fatigue, no polyphagia and no weakness. There are no hypoglycemic complications. Symptoms are worsening. There are no diabetic complications. Risk factors for coronary artery disease include diabetes mellitus, hypertension, male sex, obesity, sedentary lifestyle and tobacco exposure. Current diabetic treatment includes oral agent (monotherapy). He is compliant with treatment some of the time. His weight is fluctuating minimally. He is following a generally unhealthy diet. When asked about meal planning, he reported none. He has not had a previous  visit with a dietitian (He missed his appointment with the dietitian.). Home blood sugar record trend: Earlier documented hypoglycemia on his log and meter are likely erroneous since he did not have any symptoms during those readings. His breakfast blood glucose range is generally >200 mg/dl. His dinner blood glucose range is generally >200 mg/dl. His bedtime blood glucose range is generally >200 mg/dl. His overall blood glucose range is >200 mg/dl. An ACE inhibitor/angiotensin II receptor blocker is being taken.  Hypertension  This is a chronic problem. The current episode started more than 1 year ago. Pertinent negatives include no chest pain, headaches, neck pain, palpitations or shortness of breath. Past treatments include ACE inhibitors.     Review of Systems  Constitutional: Negative for chills, fatigue, fever and unexpected weight change.  HENT: Negative for dental problem, mouth sores and trouble swallowing.   Eyes: Negative for visual disturbance.  Respiratory: Negative for cough, choking, chest tightness, shortness of breath and wheezing.   Cardiovascular: Negative for chest pain, palpitations and leg swelling.  Gastrointestinal: Negative for abdominal distention, abdominal pain, constipation, diarrhea, nausea and vomiting.  Endocrine: Positive for polydipsia and polyuria. Negative for polyphagia.  Genitourinary: Negative for dysuria, flank pain, hematuria and urgency.  Musculoskeletal: Negative for back pain, gait problem, myalgias and neck pain.  Skin: Negative for pallor, rash and wound.  Neurological: Negative for seizures, syncope, weakness, numbness and headaches.  Psychiatric/Behavioral: Negative.  Negative for confusion and dysphoric mood.    Objective:    BP (!) 144/98   Pulse 94   Wt (!) 343 lb (155.6 kg)   SpO2 96%   BMI 45.25 kg/m   Wt Readings from Last 3 Encounters:  03/05/17 (!) 343 lb (155.6 kg)  08/04/16 (!) 354 lb (160.6 kg)  03/13/16 (!) 347 lb (157.4 kg)     Physical Exam  Constitutional: He is oriented to person, place, and time. He appears well-developed and well-nourished. He is cooperative. No distress.  HENT:  Head: Normocephalic and atraumatic.  Eyes: EOM are normal.  Neck: Normal range of motion. Neck supple. No tracheal deviation present. No thyromegaly present.  Cardiovascular: Normal rate, S1 normal, S2 normal and normal heart sounds.  Exam reveals no gallop.   No murmur heard. Pulses:      Dorsalis pedis pulses are 1+ on the right side, and 1+ on the left side.       Posterior tibial pulses are 1+ on the right side, and 1+ on the left side.  Pulmonary/Chest: Breath sounds normal. No respiratory distress. He has no wheezes.  Abdominal: Soft. Bowel sounds are normal. He exhibits no distension. There is no tenderness. There is no guarding and no CVA tenderness.  Musculoskeletal: He exhibits no edema.       Right shoulder: He exhibits no swelling and no deformity.  Neurological: He is alert and oriented to person, place, and time. He has normal strength and normal reflexes. No cranial nerve deficit  or sensory deficit. Gait normal.  Skin: Skin is warm and dry. No rash noted. No cyanosis. Nails show no clubbing.  Psychiatric: His speech is normal. Cognition and memory are normal.  Noncompliant, defensive and reluctant affect.     Complete Blood Count (Most recent): Lab Results  Component Value Date   WBC 6.7 02/25/2016   HGB 15.0 02/25/2016   HCT 45.0 02/25/2016   MCV 89.1 02/25/2016   PLT 252 02/25/2016   Chemistry (most recent): Lab Results  Component Value Date   NA 136 03/02/2017   K 4.2 03/02/2017   CL 96 (L) 03/02/2017   CO2 26 03/02/2017   BUN 13 03/02/2017   CREATININE 0.65 03/02/2017    Lab Results  Component Value Date   HGBA1C 10.3 (H) 03/02/2017   HGBA1C 10.3 (H) 10/09/2015   HGBA1C 11.4 (H) 06/07/2015     Assessment & Plan:   1. Uncontrolled type 2 diabetes mellitus without complication, without  [place]-term current use of insulin ([place]   - His diabetes is  complicated by noncompliance and patient remains at a high risk for more acute and chronic complications of diabetes which include CAD, CVA, CKD, retinopathy, and neuropathy. These are all discussed in detail with the patient.  Patient came with a meter a log showing persistently above target blood glucose readings averaging 245+ .  And he has missed his last appointment. -His repeat labs show  A1c remaining high at 10.3%.  Recent labs reviewed.  - I have re-counseled the patient on diet management and weight loss  by adopting a carbohydrate restricted / protein rich  Diet.  - Suggestion is made for patient to avoid simple carbohydrates   from his diet including Cakes , Desserts, Ice Cream,  Soda (  diet and regular) , Sweet Tea , Candies,  Chips, Cookies, Artificial Sweeteners,   and "Sugar-free" Products .  This will help patient to have stable blood glucose profile and potentially avoid unintended  Weight gain.  - Patient is advised to stick to a routine mealtimes to eat 3 meals  a day and avoid unnecessary snacks ( to snack only to correct hypoglycemia).  - The patient  has been  scheduled with Jearld Fenton, RDN, CDE for individualized DM education. He did not show up for appointment, will reschedule.  - I have approached patient with the following individualized plan to manage diabetes and patient agrees.  - Based on his significant glycemic burden, he would need additional treatment for his diabetes including insulin treatment. However, he continues to refuse his treatment. - I offered him weekly  Bydureon , he still declines. - He has discontinued his Invokana due to TV commercials, and asking if there is any replacement for it. - If his insurance cooperates, he may benefit from Jardiance  10 mg by mouth every morning. Side effects and precautions discussed with him. -I will continue metformin  1082m  ER po daily after  breakfast.  - Patient specific target  for A1c; LDL, HDL, Triglycerides, and  Waist Circumference were discussed in detail.  2) BP/HTN: Uncontrolled. Continue current medications including ACEI/ARB. I added HCTZ 282mpo qday.  3)  Weight/Diet:  He did reluctantly accepted CDE consult, however he did not follow through. he did not keep appointment. he is not motivated to exercise. - carbohydrates information provided.  4. Personal history of noncompliance with medical treatment, presenting hazards to health -I tried to counsel this patient however he is frankly unconcerned and does not  want to take any responsibility.  5) Chronic Care/Health Maintenance:  -Patient  on ACEI/ARB  and encouraged to continue to follow up with Ophthalmology, Podiatrist at least yearly or according to recommendations, and advised to stay away from  smoking. I have recommended yearly flu vaccine and pneumonia vaccination at least every 5 years; moderate intensity exercise for up to 150 minutes weekly; and  sleep for at least 7 hours a day. -He is cleared from diabetes point of view for the planned shoulder surgery.  - 20 minutes of time was spent on the care of this patient , 50% of which was applied for counseling on diabetes complications and their preventions.  - I advised patient to maintain close follow up with Deloria Lair., MD for primary care needs.  Patient is asked to bring meter and  blood glucose logs during his next visit.   Follow up plan: -Return in about 3 months (around 06/05/2017) for follow up with pre-visit labs, meter, and logs.  Glade Lloyd, MD Phone: [phone]  Fax: [phone]   03/05/2017, 12:09 PM

## 2017-03-09 ENCOUNTER — Telehealth: Payer: Self-pay

## 2017-03-09 NOTE — Telephone Encounter (Signed)
Pt called very upset (yelling and screaming that we have not signed a form yet) After speaking with him I found out that it was a PA that we are working on for his Jardiance. Pt also started yelling that I was very rude to him last week. I explained to him that I was not here last week. He states that it is ridiculous for him to test 2 x daily like Dr Dorris Fetch had asked because he was not on any diabetic medication. I asked him if he was taking the Metformin and states yes.  I attempted to contact his case worker because this is very out of character for him to yell and scream on the phone. Case worker did not answer. Will continue to attempt to contact.

## 2017-03-10 ENCOUNTER — Telehealth: Payer: Self-pay

## 2017-03-10 NOTE — Telephone Encounter (Signed)
FYI  Pt discharged self from practice today. Signed release for records to be sent to Dr Buddy Duty. All f/u appts cancelled.

## 2017-03-10 NOTE — Telephone Encounter (Signed)
NOTED

## 2017-06-01 ENCOUNTER — Other Ambulatory Visit: Payer: Self-pay | Admitting: "Endocrinology

## 2017-06-01 ENCOUNTER — Ambulatory Visit (INDEPENDENT_AMBULATORY_CARE_PROVIDER_SITE_OTHER): Payer: Medicare Other | Admitting: Otolaryngology

## 2017-06-01 DIAGNOSIS — J330 Polyp of nasal cavity: Secondary | ICD-10-CM | POA: Diagnosis not present

## 2017-06-01 DIAGNOSIS — J342 Deviated nasal septum: Secondary | ICD-10-CM

## 2017-06-01 DIAGNOSIS — J343 Hypertrophy of nasal turbinates: Secondary | ICD-10-CM

## 2017-06-09 ENCOUNTER — Ambulatory Visit: Payer: Medicare Other | Admitting: "Endocrinology

## 2017-06-22 ENCOUNTER — Ambulatory Visit (INDEPENDENT_AMBULATORY_CARE_PROVIDER_SITE_OTHER): Payer: Medicare Other | Admitting: Otolaryngology

## 2017-06-26 ENCOUNTER — Other Ambulatory Visit (INDEPENDENT_AMBULATORY_CARE_PROVIDER_SITE_OTHER): Payer: Self-pay | Admitting: Otolaryngology

## 2017-06-26 DIAGNOSIS — J329 Chronic sinusitis, unspecified: Secondary | ICD-10-CM

## 2017-07-28 HISTORY — DX: Hemochromatosis, unspecified: E83.119

## 2017-07-29 ENCOUNTER — Other Ambulatory Visit: Payer: Self-pay | Admitting: "Endocrinology

## 2017-08-26 DIAGNOSIS — Z794 Long term (current) use of insulin: Secondary | ICD-10-CM | POA: Diagnosis not present

## 2017-08-26 DIAGNOSIS — I10 Essential (primary) hypertension: Secondary | ICD-10-CM | POA: Diagnosis not present

## 2017-08-26 DIAGNOSIS — E1169 Type 2 diabetes mellitus with other specified complication: Secondary | ICD-10-CM | POA: Diagnosis not present

## 2017-08-26 DIAGNOSIS — R1084 Generalized abdominal pain: Secondary | ICD-10-CM | POA: Diagnosis not present

## 2017-08-26 DIAGNOSIS — B999 Unspecified infectious disease: Secondary | ICD-10-CM | POA: Diagnosis not present

## 2017-08-26 DIAGNOSIS — E088 Diabetes mellitus due to underlying condition with unspecified complications: Secondary | ICD-10-CM | POA: Diagnosis not present

## 2017-08-27 DIAGNOSIS — E088 Diabetes mellitus due to underlying condition with unspecified complications: Secondary | ICD-10-CM | POA: Diagnosis not present

## 2017-08-27 DIAGNOSIS — R1084 Generalized abdominal pain: Secondary | ICD-10-CM | POA: Diagnosis not present

## 2017-08-27 DIAGNOSIS — Z794 Long term (current) use of insulin: Secondary | ICD-10-CM | POA: Diagnosis not present

## 2017-08-28 ENCOUNTER — Ambulatory Visit: Payer: Medicare Other | Admitting: Urology

## 2017-08-28 ENCOUNTER — Ambulatory Visit (INDEPENDENT_AMBULATORY_CARE_PROVIDER_SITE_OTHER): Payer: Medicare Other | Admitting: Urology

## 2017-08-28 DIAGNOSIS — N471 Phimosis: Secondary | ICD-10-CM

## 2017-08-31 ENCOUNTER — Other Ambulatory Visit: Payer: Self-pay | Admitting: "Endocrinology

## 2017-09-01 ENCOUNTER — Other Ambulatory Visit: Payer: Self-pay | Admitting: Urology

## 2017-09-04 DIAGNOSIS — K760 Fatty (change of) liver, not elsewhere classified: Secondary | ICD-10-CM | POA: Diagnosis not present

## 2017-09-04 DIAGNOSIS — K8020 Calculus of gallbladder without cholecystitis without obstruction: Secondary | ICD-10-CM | POA: Diagnosis not present

## 2017-09-10 NOTE — Patient Instructions (Signed)
Kyle Collins  09/10/2017     @PREFPERIOPPHARMACY @   Your procedure is scheduled on 09/14/2017.  Report to [facility] at 7:00 A.M.  Call this number if you have problems the morning of surgery:  [phone]   Remember:  Do not eat food or drink liquids after midnight.  Take these medicines the morning of surgery with A SIP OF WATER Amlodipine, Lexapro, Flonase, Lamictal, Zanaflex,  NO diabetes medication   Do not wear jewelry, make-up or nail polish.  Do not wear lotions, powders, or perfumes, or deodorant.  Do not shave 48 hours prior to surgery.  Men may shave face and neck.  Do not bring valuables to the hospital.  [facility] is not responsible for any belongings or valuables.  Contacts, dentures or bridgework may not be worn into surgery.  Leave your suitcase in the car.  After surgery it may be brought to your room.  For patients admitted to the hospital, discharge time will be determined by your treatment team.  Patients discharged the day of surgery will not be allowed to drive home.    Please read over the following fact sheets that you were given. Surgical Site Infection Prevention and Anesthesia Post-op Instructions     PATIENT INSTRUCTIONS POST-ANESTHESIA  IMMEDIATELY FOLLOWING SURGERY:  Do not drive or operate machinery for the first twenty four hours after surgery.  Do not make any important decisions for twenty four hours after surgery or while taking narcotic pain medications or sedatives.  If you develop intractable nausea and vomiting or a severe headache please notify your doctor immediately.  FOLLOW-UP:  Please make an appointment with your surgeon as instructed. You do not need to follow up with anesthesia unless specifically instructed to do so.  WOUND CARE INSTRUCTIONS (if applicable):  Keep a dry clean dressing on the anesthesia/puncture wound site if there is drainage.  Once the wound has quit draining you may leave it open to air.  Generally you  should leave the bandage intact for twenty four hours unless there is drainage.  If the epidural site drains for more than 36-48 hours please call the anesthesia department.  QUESTIONS?:  Please feel free to call your physician or the hospital operator if you have any questions, and they will be happy to assist you.      Circumcision, Adult Circumcision is a surgery to remove the foreskin of the penis or to cut the foreskin so the space between skin and the tip of the penis is larger. When only the foreskin is cut, it is called a dorsal incision. A dorsal circumcision leaves the entire foreskin but makes the end of the foreskin looser so it can be pulled back over the head of the penis. Tell a health care provider about:  Any allergies you have.  All medicines you are taking, including vitamins, herbs, eye drops, creams, and over-the-counter medicines.  Any problems you or family members have had with anesthetic medicines.  Any blood disorders you have.  Any surgeries you have had.  Any medical conditions you have, including a cold or other infection. What are the risks? Generally, this is a safe procedure. However, problems may occur, including:  Bleeding.  Infection.  Pain.  Urethral injury. The urethra is a tube that ends at the tip of the penis and carries urine out of your body.  Opening of the surgical wound. This can occur from an unwanted erection after surgery.  Allergic reactions to medicines.  What happens before the procedure? Staying hydrated Follow instructions from your health care provider about hydration, which may include:  Up to 2 hours before the procedure - you may continue to drink clear liquids, such as water, clear fruit juice, black coffee, and plain tea.  Eating and drinking restrictions Follow instructions from your health care provider about eating and drinking, which may include:  8 hours before the procedure - stop eating heavy meals or foods  such as meat, fried foods, or fatty foods.  6 hours before the procedure - stop eating light meals or foods, such as toast or cereal.  6 hours before the procedure - stop drinking milk or drinks that contain milk.  2 hours before the procedure - stop drinking clear liquids.  Medicines  Ask your health care provider about: ? Changing or stopping your regular medicines. This is especially important if you take diabetes medicines or blood thinners. ? Taking medicines such as aspirin and ibuprofen. These medicines can thin your blood. Do not take these medicines before your procedure if your doctor instructs you not to.  You may be given antibiotic medicine to help prevent infection. General instructions  If you will be going home right after the procedure, plan to have someone with you for 24 hours.  Plan to have someone take you home from the hospital or clinic.  Ask your health care provider how your surgical site will be marked or identified.  You may be asked to shower with a germ-killing soap. What happens during the procedure?  To reduce your risk of infection: ? Your health care team will wash or sanitize their hands. ? Your skin will be washed with soap.  An IV tube may be inserted into one of your veins.  You may be given medicine to help you relax (sedative).  An anesthetic will be injected with a needle into the skin of your penis (local anesthetic) to numb the nerves of your foreskin.  An incision will be made to remove or cut the foreskin.  Absorbable stitches (sutures) may be used to close the incision.  A bandage (dressing) will be applied to the incision site. The procedure may vary among health care providers and hospitals. What happens after the procedure?  Your blood pressure, heart rate, breathing rate, and blood oxygen level will be monitored until the medicines you were given have worn off.  Do not get out of bed until your health care provider  approves.  Do not drive for 24 hours after the procedure if you were given a sedative. Summary  Circumcision is a surgery to remove the foreskin of the penis or to cut the foreskin so the opening is larger.  Absorbable sutures may be used to close the incision after the foreskin has been removed or cut.  If you will be going home right after the procedure, plan to have someone with you for 24 hours. This information is not intended to replace advice given to you by your health care provider. Make sure you discuss any questions you have with your health care provider. Document Released: 08/03/2007 Document Revised: 06/02/2016 Document Reviewed: 06/02/2016 Elsevier Interactive Patient Education  2017 [employer].

## 2017-09-11 ENCOUNTER — Encounter (HOSPITAL_COMMUNITY)
Admission: RE | Admit: 2017-09-11 | Discharge: 2017-09-11 | Disposition: A | Discharge status: Home / Self Care | Payer: Medicare Other | Source: Ambulatory Visit | Attending: Urology | Admitting: Urology

## 2017-09-11 ENCOUNTER — Encounter (HOSPITAL_COMMUNITY): Payer: Self-pay

## 2017-09-11 DIAGNOSIS — F319 Bipolar disorder, unspecified: Secondary | ICD-10-CM | POA: Diagnosis not present

## 2017-09-11 DIAGNOSIS — E079 Disorder of thyroid, unspecified: Secondary | ICD-10-CM | POA: Diagnosis not present

## 2017-09-11 DIAGNOSIS — Z87891 Personal history of nicotine dependence: Secondary | ICD-10-CM | POA: Diagnosis not present

## 2017-09-11 DIAGNOSIS — G4730 Sleep apnea, unspecified: Secondary | ICD-10-CM | POA: Diagnosis not present

## 2017-09-11 DIAGNOSIS — E119 Type 2 diabetes mellitus without complications: Secondary | ICD-10-CM | POA: Diagnosis not present

## 2017-09-11 DIAGNOSIS — N471 Phimosis: Secondary | ICD-10-CM | POA: Diagnosis not present

## 2017-09-11 DIAGNOSIS — Z79899 Other long term (current) drug therapy: Secondary | ICD-10-CM | POA: Diagnosis not present

## 2017-09-11 DIAGNOSIS — N476 Balanoposthitis: Secondary | ICD-10-CM | POA: Diagnosis not present

## 2017-09-11 DIAGNOSIS — F419 Anxiety disorder, unspecified: Secondary | ICD-10-CM | POA: Diagnosis not present

## 2017-09-11 DIAGNOSIS — K219 Gastro-esophageal reflux disease without esophagitis: Secondary | ICD-10-CM | POA: Diagnosis not present

## 2017-09-11 DIAGNOSIS — I10 Essential (primary) hypertension: Secondary | ICD-10-CM | POA: Diagnosis not present

## 2017-09-11 LAB — CBC
HEMATOCRIT: 51.8 % (ref 39.0–52.0)
HEMOGLOBIN: 17.6 g/dL — AB (ref 13.0–17.0)
MCH: 30.6 pg (ref 26.0–34.0)
MCHC: 34 g/dL (ref 30.0–36.0)
MCV: 89.9 fL (ref 78.0–100.0)
Platelets: 278 10*3/uL (ref 150–400)
RBC: 5.76 MIL/uL (ref 4.22–5.81)
RDW: 13.2 % (ref 11.5–15.5)
WBC: 10.7 10*3/uL — ABNORMAL HIGH (ref 4.0–10.5)

## 2017-09-11 LAB — GLUCOSE, CAPILLARY: Glucose-Capillary: 208 mg/dL — ABNORMAL HIGH (ref 65–99)

## 2017-09-11 LAB — BASIC METABOLIC PANEL
Anion gap: 16 — ABNORMAL HIGH (ref 5–15)
BUN: 22 mg/dL — AB (ref 6–20)
CHLORIDE: 98 mmol/L — AB (ref 101–111)
CO2: 20 mmol/L — AB (ref 22–32)
CREATININE: 0.81 mg/dL (ref 0.61–1.24)
Calcium: 9.9 mg/dL (ref 8.9–10.3)
GFR calc Af Amer: >60 mL/min (ref >60)
GFR calc non Af Amer: >60 mL/min (ref >60)
GLUCOSE: 190 mg/dL — AB (ref 65–99)
POTASSIUM: 3.7 mmol/L (ref 3.5–5.1)
Sodium: 134 mmol/L — ABNORMAL LOW (ref 135–145)

## 2017-09-11 LAB — HEMOGLOBIN A1C
HEMOGLOBIN A1C: 9.9 % — AB (ref 4.8–5.6)
Mean Plasma Glucose: 237.43 mg/dL

## 2017-09-14 ENCOUNTER — Encounter (HOSPITAL_COMMUNITY)
Admission: RE | Disposition: A | Discharge status: Home / Self Care | Payer: Self-pay | Source: Ambulatory Visit | Attending: Urology

## 2017-09-14 ENCOUNTER — Ambulatory Visit (HOSPITAL_COMMUNITY)
Admission: RE | Admit: 2017-09-14 | Discharge: 2017-09-14 | Disposition: A | Discharge status: Home / Self Care | Payer: Medicare Other | Source: Ambulatory Visit | Attending: Urology | Admitting: Urology

## 2017-09-14 ENCOUNTER — Encounter (HOSPITAL_COMMUNITY): Payer: Self-pay | Admitting: *Deleted

## 2017-09-14 ENCOUNTER — Other Ambulatory Visit: Payer: Self-pay

## 2017-09-14 ENCOUNTER — Ambulatory Visit (HOSPITAL_COMMUNITY): Payer: Medicare Other | Admitting: Anesthesiology

## 2017-09-14 DIAGNOSIS — E119 Type 2 diabetes mellitus without complications: Secondary | ICD-10-CM | POA: Insufficient documentation

## 2017-09-14 DIAGNOSIS — I10 Essential (primary) hypertension: Secondary | ICD-10-CM | POA: Insufficient documentation

## 2017-09-14 DIAGNOSIS — N476 Balanoposthitis: Secondary | ICD-10-CM | POA: Diagnosis not present

## 2017-09-14 DIAGNOSIS — N481 Balanitis: Secondary | ICD-10-CM | POA: Diagnosis not present

## 2017-09-14 DIAGNOSIS — Z87891 Personal history of nicotine dependence: Secondary | ICD-10-CM | POA: Insufficient documentation

## 2017-09-14 DIAGNOSIS — Z79899 Other long term (current) drug therapy: Secondary | ICD-10-CM | POA: Diagnosis not present

## 2017-09-14 DIAGNOSIS — G4730 Sleep apnea, unspecified: Secondary | ICD-10-CM | POA: Diagnosis not present

## 2017-09-14 DIAGNOSIS — N471 Phimosis: Secondary | ICD-10-CM | POA: Insufficient documentation

## 2017-09-14 DIAGNOSIS — E079 Disorder of thyroid, unspecified: Secondary | ICD-10-CM | POA: Diagnosis not present

## 2017-09-14 DIAGNOSIS — K219 Gastro-esophageal reflux disease without esophagitis: Secondary | ICD-10-CM | POA: Insufficient documentation

## 2017-09-14 DIAGNOSIS — F419 Anxiety disorder, unspecified: Secondary | ICD-10-CM | POA: Diagnosis not present

## 2017-09-14 DIAGNOSIS — N478 Other disorders of prepuce: Secondary | ICD-10-CM | POA: Diagnosis not present

## 2017-09-14 DIAGNOSIS — F319 Bipolar disorder, unspecified: Secondary | ICD-10-CM | POA: Insufficient documentation

## 2017-09-14 HISTORY — PX: CIRCUMCISION: SHX1350

## 2017-09-14 LAB — GLUCOSE, CAPILLARY
GLUCOSE-CAPILLARY: 288 mg/dL — AB (ref 65–99)
GLUCOSE-CAPILLARY: 206 mg/dL — AB (ref 65–99)

## 2017-09-14 SURGERY — CIRCUMCISION, ADULT
Anesthesia: General

## 2017-09-14 MED ORDER — SUCCINYLCHOLINE CHLORIDE 20 MG/ML IJ SOLN
INTRAMUSCULAR | Status: DC | PRN
  Administered 2017-09-14: 160 mg via INTRAVENOUS

## 2017-09-14 MED ORDER — FENTANYL CITRATE (PF) 100 MCG/2ML IJ SOLN: 25.0000 ug | INTRAMUSCULAR | Status: DC | PRN

## 2017-09-14 MED ORDER — MIDAZOLAM HCL 2 MG/2ML IJ SOLN
INTRAMUSCULAR | Status: AC
  Filled 2017-09-14: qty 2

## 2017-09-14 MED ORDER — CEFAZOLIN SODIUM-DEXTROSE 2-4 GM/100ML-% IV SOLN: 2.0000 g | INTRAVENOUS | Status: DC

## 2017-09-14 MED ORDER — CEFAZOLIN SODIUM-DEXTROSE 1-4 GM/50ML-% IV SOLN
INTRAVENOUS | Status: AC
  Filled 2017-09-14: qty 50

## 2017-09-14 MED ORDER — FENTANYL CITRATE (PF) 100 MCG/2ML IJ SOLN
INTRAMUSCULAR | Status: AC
  Filled 2017-09-14: qty 2

## 2017-09-14 MED ORDER — MIDAZOLAM HCL 2 MG/2ML IJ SOLN
1.0000 mg | INTRAMUSCULAR | Status: AC
  Administered 2017-09-14: 2 mg via INTRAVENOUS

## 2017-09-14 MED ORDER — 0.9 % SODIUM CHLORIDE (POUR BTL) OPTIME
TOPICAL | Status: DC | PRN
  Administered 2017-09-14: 500 mL

## 2017-09-14 MED ORDER — SULFAMETHOXAZOLE-TRIMETHOPRIM 800-160 MG PO TABS: 1.0000 | ORAL_TABLET | Freq: Two times a day (BID) | ORAL | 0 refills | Status: DC

## 2017-09-14 MED ORDER — CEFAZOLIN SODIUM-DEXTROSE 1-4 GM/50ML-% IV SOLN
1.0000 g | INTRAVENOUS | Status: AC
  Administered 2017-09-14: 1 g via INTRAVENOUS

## 2017-09-14 MED ORDER — CEFAZOLIN SODIUM-DEXTROSE 2-4 GM/100ML-% IV SOLN
2.0000 g | INTRAVENOUS | Status: AC
  Administered 2017-09-14: 2 g via INTRAVENOUS

## 2017-09-14 MED ORDER — PROPOFOL 10 MG/ML IV BOLUS
INTRAVENOUS | Status: AC
  Filled 2017-09-14: qty 40

## 2017-09-14 MED ORDER — LIDOCAINE HCL (PF) 1 % IJ SOLN
INTRAMUSCULAR | Status: AC
  Filled 2017-09-14: qty 5

## 2017-09-14 MED ORDER — LIDOCAINE HCL (PF) 2 % IJ SOLN
INTRAMUSCULAR | Status: DC | PRN
  Administered 2017-09-14: 40 mg via INTRADERMAL

## 2017-09-14 MED ORDER — BUPIVACAINE HCL (PF) 0.25 % IJ SOLN
INTRAMUSCULAR | Status: AC
  Filled 2017-09-14: qty 30

## 2017-09-14 MED ORDER — MIDAZOLAM HCL 5 MG/5ML IJ SOLN
INTRAMUSCULAR | Status: DC | PRN
  Administered 2017-09-14: 1 mg via INTRAVENOUS

## 2017-09-14 MED ORDER — CEFAZOLIN SODIUM-DEXTROSE 2-4 GM/100ML-% IV SOLN
INTRAVENOUS | Status: AC
  Filled 2017-09-14: qty 100

## 2017-09-14 MED ORDER — BUPIVACAINE HCL (PF) 0.25 % IJ SOLN
INTRAMUSCULAR | Status: DC | PRN
  Administered 2017-09-14: 10 mL

## 2017-09-14 MED ORDER — PROPOFOL 10 MG/ML IV BOLUS
INTRAVENOUS | Status: DC | PRN
  Administered 2017-09-14: 50 mg via INTRAVENOUS
  Administered 2017-09-14: 250 mg via INTRAVENOUS

## 2017-09-14 MED ORDER — FENTANYL CITRATE (PF) 100 MCG/2ML IJ SOLN
INTRAMUSCULAR | Status: DC | PRN
  Administered 2017-09-14: 100 ug via INTRAVENOUS
  Administered 2017-09-14 (×2): 50 ug via INTRAVENOUS

## 2017-09-14 MED ORDER — LACTATED RINGERS IV SOLN
INTRAVENOUS | Status: DC
  Administered 2017-09-14 (×2): via INTRAVENOUS

## 2017-09-14 MED ORDER — ONDANSETRON HCL 4 MG/2ML IJ SOLN
4.0000 mg | Freq: Once | INTRAMUSCULAR | Status: AC
  Administered 2017-09-14: 4 mg via INTRAVENOUS

## 2017-09-14 MED ORDER — ONDANSETRON HCL 4 MG/2ML IJ SOLN
INTRAMUSCULAR | Status: AC
  Filled 2017-09-14: qty 2

## 2017-09-14 MED ORDER — SUCCINYLCHOLINE CHLORIDE 20 MG/ML IJ SOLN
INTRAMUSCULAR | Status: AC
  Filled 2017-09-14: qty 1

## 2017-09-14 MED ORDER — OXYCODONE-ACETAMINOPHEN 5-325 MG PO TABS: 1.0000 | ORAL_TABLET | ORAL | 0 refills | Status: DC | PRN

## 2017-09-14 SURGICAL SUPPLY — 27 items
BAG HAMPER (MISCELLANEOUS) ×3 IMPLANT
BANDAGE COBAN STERILE 2 (GAUZE/BANDAGES/DRESSINGS) ×3 IMPLANT
BNDG CONFORM 2 STRL LF (GAUZE/BANDAGES/DRESSINGS) ×3 IMPLANT
COVER LIGHT HANDLE STERIS (MISCELLANEOUS) ×6 IMPLANT
DECANTER SPIKE VIAL GLASS SM (MISCELLANEOUS) ×3 IMPLANT
DRSG XEROFORM 1X8 (GAUZE/BANDAGES/DRESSINGS) ×3 IMPLANT
ELECT NDL TIP 2.8 STRL (NEEDLE) ×1 IMPLANT
ELECT NEEDLE TIP 2.8 STRL (NEEDLE) ×3 IMPLANT
ELECT REM PT RETURN 9FT ADLT (ELECTROSURGICAL) ×3
ELECTRODE REM PT RTRN 9FT ADLT (ELECTROSURGICAL) ×1 IMPLANT
GLOVE BIO SURGEON STRL SZ8 (GLOVE) ×3 IMPLANT
GLOVE BIOGEL PI IND STRL 6.5 (GLOVE) IMPLANT
GLOVE BIOGEL PI IND STRL 7.0 (GLOVE) ×2 IMPLANT
GLOVE BIOGEL PI INDICATOR 6.5 (GLOVE) ×2
GLOVE BIOGEL PI INDICATOR 7.0 (GLOVE) ×4
GLOVE SURG SS PI 6.5 STRL IVOR (GLOVE) ×2 IMPLANT
GOWN STRL REUS W/ TWL XL LVL3 (GOWN DISPOSABLE) ×1 IMPLANT
GOWN STRL REUS W/TWL XL LVL3 (GOWN DISPOSABLE) ×3
KIT ROOM TURNOVER APOR (KITS) ×6 IMPLANT
NDL HYPO 25X1 1.5 SAFETY (NEEDLE) ×1 IMPLANT
NEEDLE HYPO 25X1 1.5 SAFETY (NEEDLE) ×3 IMPLANT
NS IRRIG 1000ML POUR BTL (IV SOLUTION) ×3 IMPLANT
PACK MINOR (CUSTOM PROCEDURE TRAY) ×3 IMPLANT
PAD ARMBOARD 7.5X6 YLW CONV (MISCELLANEOUS) ×3 IMPLANT
SET BASIN LINEN APH (SET/KITS/TRAYS/PACK) ×3 IMPLANT
SUT VIC AB 4-0 PS2 27 (SUTURE) ×6 IMPLANT
SYR CONTROL 10ML LL (SYRINGE) ×3 IMPLANT

## 2017-09-14 NOTE — Transfer of Care (Signed)
Immediate Anesthesia Transfer of Care Note  Patient: Devinn Hurwitz Drost  Procedure(s) Performed: CIRCUMCISION (N/A )  Patient Location: PACU  Anesthesia Type:General  Level of Consciousness: awake, alert , oriented and patient cooperative  Airway & Oxygen Therapy: Patient Spontanous Breathing and Patient connected to nasal cannula oxygen  Post-op Assessment: Report given to RN and Post -op Vital signs reviewed and stable  Post vital signs: Reviewed and stable  Last Vitals:  Vitals:   09/14/17 0905 09/14/17 0910  BP:  128/89  Pulse:    Resp: 17 (!) 22  Temp:    SpO2: 96% 95%    Last Pain:  Vitals:   09/14/17 0730  TempSrc: Oral         Complications: No apparent anesthesia complications

## 2017-09-14 NOTE — Op Note (Signed)
Preoperative diagnosis: phimosis, recurrent balanoposthitis  Postoperative diagnosis: Same  Procedure: circumcision  Attending: [name], MD  Anesthesia: general  History of blood loss: Minimal  Antibiotics: ancef  Drains: none  Specimens: foreskin  Findings: dense phimotic ring. No masses/lesions on glans or foreskin.  Indications: Patient is a 32 year old male with a history of phimosis, difficulty urinating, and recurrent balanoprothitis  After discussing treatment options he decided to proceed with circumcision   Procedure in detail: Prior to procedure consent was obtained.  Patient was brought to the operating room and a brief timeout was done to ensure correct patient, correct procedure, correct site.  General anesthesia was administered and patient was placed in supine position.  His genitalia and abdomen was then prepped and draped in usual sterile fashion.  The phimotic ring was incised sharply and the foreskin was then retracted. An circumferential incision was made 1.5cm below the coronal ridge. We then pulled the foreskin over the glans and made a separate circumferential incision at the level of the coronal ridge. We then sharply incised the foreskin and then freed it from the dartos using electrocautery. We then sent the foreskin for pathology. Individual bleeders were then cauterized and good hemostasis was noted. We then reapproximated the penile skin to the subcoronal incision. We used interrupted 3-0 vicryl to close the incision. Once this was complete we applied a gauze bandage and this then concluded the procedure which was well tolerated by the patient.  Complications: None  Condition: Stable, extubated, transferred to PACU.  Plan: Patient is to be discharged home.  He is to followup in 2 weeks for a wound check

## 2017-09-14 NOTE — Anesthesia Procedure Notes (Signed)
Procedure Name: Intubation Date/Time: 09/14/2017 9:21 AM Performed by: Raenette Rover, CRNA Pre-anesthesia Checklist: Patient identified, Emergency Drugs available, Suction available and Patient being monitored Patient Re-evaluated:Patient Re-evaluated prior to induction Oxygen Delivery Method: Circle system utilized Preoxygenation: Pre-oxygenation with 100% oxygen Induction Type: IV induction, Rapid sequence and Cricoid Pressure applied Laryngoscope Size: Miller and 3 Grade View: Grade I Tube type: Oral Tube size: 8.0 mm Number of attempts: 1 Airway Equipment and Method: Stylet Placement Confirmation: ETT inserted through vocal cords under direct vision,  positive ETCO2,  CO2 detector and breath sounds checked- equal and bilateral Secured at: 22 cm Tube secured with: Tape Dental Injury: Teeth and Oropharynx as per pre-operative assessment

## 2017-09-14 NOTE — Anesthesia Preprocedure Evaluation (Signed)
Anesthesia Evaluation  Patient identified by MRN, date of birth, ID band Patient awake    Reviewed: Allergy & Precautions, Patient's Chart, lab work & pertinent test results  Airway Mallampati: II  TM Distance: >3 FB Neck ROM: Full    Dental  (+) Poor Dentition, Missing, Chipped,    Pulmonary sleep apnea and Continuous Positive Airway Pressure Ventilation , former smoker,     + decreased breath sounds      Cardiovascular hypertension, Pt. on medications  Rhythm:Regular Rate:Normal     Neuro/Psych  Headaches, PSYCHIATRIC DISORDERS Anxiety Depression Bipolar Disorder    GI/Hepatic GERD  Medicated and Poorly Controlled,  Endo/Other  diabetes, Poorly Controlled, Type 2Hypothyroidism Morbid obesity  Renal/GU      Musculoskeletal   Abdominal (+) + obese,   Peds  Hematology   Anesthesia Other Findings   Reproductive/Obstetrics                             Anesthesia Physical Anesthesia Plan  ASA: III  Anesthesia Plan: General   Post-op Pain Management:    Induction: Intravenous, Rapid sequence and Cricoid pressure planned  PONV Risk Score and Plan:   Airway Management Planned: Oral ETT  Additional Equipment:   Intra-op Plan:   Post-operative Plan: Extubation in OR  Informed Consent: I have reviewed the patients History and Physical, chart, labs and discussed the procedure including the risks, benefits and alternatives for the proposed anesthesia with the patient or authorized representative who has indicated his/her understanding and acceptance.     Plan Discussed with:   Anesthesia Plan Comments:         Anesthesia Quick Evaluation

## 2017-09-14 NOTE — Addendum Note (Signed)
Addendum  created 09/14/17 1156 by Vista Deck, CRNA   Sign clinical note

## 2017-09-14 NOTE — H&P (Signed)
Urology Admission H&P  Chief Complaint: phimosi  History of Present Illness: Kyle Collins is a 32yo with phimosis and recurrent balanoposthitis that has been resistant to medical therapy. He has dysuria and difficulty emptying his bladder  Past Medical History:  Diagnosis Date  . Anxiety   . Bipolar disorder ([place])   . Depression   . Diabetes mellitus   . GERD (gastroesophageal reflux disease)   . Headache   . Hypertension   . Sleep apnea   . Thyroid disease    Past Surgical History:  Procedure Laterality Date  . HERNIA REPAIR     scrotum  . ORIF SHOULDER FRACTURE Right 02/28/2016   Procedure: OPEN REDUCTION INTERNAL FIXATION (ORIF) SHOULDER FRACTURE;  Surgeon: Carole Civil, MD;  Location: [facility];  Service: Orthopedics;  Laterality: Right;  . SHOULDER CLOSED REDUCTION Right 12/04/2015   Procedure: CLOSED REDUCTION SHOULDER;  Surgeon: Carole Civil, MD;  Location: [facility];  Service: Orthopedics;  Laterality: Right;  . TOOTH EXTRACTION  spring 2016   top left     Home Medications:  Current Facility-Administered Medications  Medication Dose Route Frequency Provider Last Rate Last Dose  . ceFAZolin (ANCEF) IVPB 1 g/50 mL premix  1 g Intravenous 30 min Pre-Op Saphia Vanderford, Candee Furbish, MD       And  . ceFAZolin (ANCEF) IVPB 2g/100 mL premix  2 g Intravenous 30 min Pre-Op Delynn Pursley, Candee Furbish, MD      . lactated ringers infusion   Intravenous Continuous Lerry Liner, MD 75 mL/hr at 09/14/17 0258     Allergies:  Allergies  Allergen Reactions  . Methylphenidate Derivatives Other (See Comments)    Patient states it made him "act like a zombie" when given as a child  . Ritalin [Methylphenidate Hcl]     Family History  Problem Relation Age of Onset  . Diabetes Mother   . Diabetes Father    Social History:  reports that he quit smoking about 8 years ago. His smoking use included e-cigarettes. He has a 2.50 pack-year smoking history. he has never used smokeless tobacco. He reports  that he drinks alcohol. He reports that he does not use drugs.  Review of Systems  Genitourinary: Positive for dysuria.  All other systems reviewed and are negative.   Physical Exam:  Vital signs in last 24 hours: Temp:  [98.9 F (37.2 C)] 98.9 F (37.2 C) (02/18 0730) Pulse Rate:  [102] 102 (02/18 0730) BP: (135)/(97) 135/97 (02/18 0730) Physical Exam  Constitutional: He is oriented to person, place, and time. He appears well-developed and well-nourished.  HENT:  Head: Normocephalic and atraumatic.  Eyes: EOM are normal. Pupils are equal, round, and reactive to light.  Neck: Normal range of motion. No thyromegaly present.  Cardiovascular: Normal rate and regular rhythm.  Respiratory: Effort normal. No respiratory distress.  GI: Soft. He exhibits no distension.  Genitourinary: Uncircumcised. Phimosis present.  Musculoskeletal: Normal range of motion. He exhibits no edema.  Neurological: He is alert and oriented to person, place, and time.  Skin: Skin is warm and dry.  Psychiatric: He has a normal mood and affect. His behavior is normal. Judgment and thought content normal.    Laboratory Data:  Results for orders placed or performed during the hospital encounter of 09/14/17 (from the past 24 hour(s))  Glucose, capillary     Status: Abnormal   Collection Time: 09/14/17  7:34 AM  Result Value Ref Range   Glucose-Capillary 288 (H) 65 - 99 mg/dL  No results found for this or any previous visit (from the past 240 hour(s)). Creatinine: Recent Labs    09/11/17 1331  CREATININE 0.81   Baseline Creatinine: 0.8  Impression/Assessment:  31yo with phimosis and recurrent balanoposthitis  Plan:  The risks/benefits/alternatives to circumcision was explained to the patient and he understands and wishes to proceed with surgery  [name] 09/14/2017, 8:38 AM

## 2017-09-14 NOTE — Anesthesia Postprocedure Evaluation (Signed)
Anesthesia Post Note  Patient: Kyle Collins  Procedure(s) Performed: CIRCUMCISION (N/A )  Patient location during evaluation: PACU Anesthesia Type: General Level of consciousness: awake, awake and alert, oriented and patient cooperative Pain management: pain level controlled Vital Signs Assessment: post-procedure vital signs reviewed and stable Respiratory status: spontaneous breathing, nonlabored ventilation, respiratory function stable and patient connected to nasal cannula oxygen Cardiovascular status: stable Postop Assessment: no apparent nausea or vomiting Anesthetic complications: no     Last Vitals:  Vitals:   09/14/17 0905 09/14/17 0910  BP:  128/89  Pulse:    Resp: 17 (!) 22  Temp:    SpO2: 96% 95%    Last Pain:  Vitals:   09/14/17 0730  TempSrc: Oral                 Saralee Bolick L

## 2017-09-14 NOTE — Discharge Instructions (Signed)
Circumcision, Adult, Care After This sheet gives you information about how to care for yourself after your procedure. Your doctor may also give you more specific instructions. If you have problems or questions, contact your doctor. Follow these instructions at home: Cut care  Follow instructions from your doctor about how to take care of your cut from surgery (incision). Make sure you: ? Wash your hands with soap and water before you change your bandage (dressing). If you cannot use soap and water, use hand sanitizer. ? Change your bandage as told by your doctor. ? Leave stitches (sutures), skin glue, or skin tape (adhesive) strips in place. They may need to stay in place for 2 weeks or longer. If tape strips get loose and curl up, you may trim the loose edges. Do not remove tape strips completely unless your doctor says it is okay  Check your cut area every day for signs of infection. Check for: ? More redness, swelling, or pain. ? More fluid or blood. ? Warmth. ? Pus or a bad smell. Bathing  Do not take baths, swim, or use a hot tub until your doctor says it is okay. Ask your doctor if you can take showers. You may only be allowed to take sponge baths for bathing.  Do not get your cut area wet during the 24 hours after the procedure, or as [place] as told by your doctor.  When you can shower, do not rub the cut area. Gently pat it dry. General instructions  Avoid heavy lifting, contact sports, biking, or swimming until your doctor says it is okay.  Do not have sex until your doctor says it is okay.  Take or apply over-the-counter and prescription medicines only as told by your doctor.  Keep all follow-up visits as told by your doctor. This is important. Contact a doctor if:  Medicine does not help your pain.  You have more redness, swelling, or pain around your cut.  You have more fluid or blood coming from your cut.  Your cut feels warm to the touch.  You have pus or a bad  smell coming from your cut.  You have a fever. Get help right away if:  You cannot pee (urinate).  It hurts to pee.  Your pain is not helped by medicines.  There is redness, swelling, and soreness that spreads up the shaft of your penis, your thighs, or your lower belly (abdomen).  You have bleeding that does not stop when you press on it. Summary  Follow instructions from your doctor about how to take care of your cut from surgery (incision).  Check your cut area every day for signs of infection.  Do not have sex until your doctor says it is okay. This information is not intended to replace advice given to you by your health care provider. Make sure you discuss any questions you have with your health care provider. Document Released: 12/31/2007 Document Revised: 07/22/2016 Document Reviewed: 07/22/2016 Elsevier Interactive Patient Education  2017 [place] Anesthesia, Adult, Care After These instructions provide you with information about caring for yourself after your procedure. Your health care provider may also give you more specific instructions. Your treatment has been planned according to current medical practices, but problems sometimes occur. Call your health care provider if you have any problems or questions after your procedure. What can I expect after the procedure? After the procedure, it is common to have:  Vomiting.  A sore throat.  Mental slowness.  It is common to feel:  Nauseous.  Cold or shivery.  Sleepy.  Tired.  Sore or achy, even in parts of your body where you did not have surgery.  Follow these instructions at home: For at least 24 hours after the procedure:  Do not: ? Participate in activities where you could fall or become injured. ? Drive. ? Use heavy machinery. ? Drink alcohol. ? Take sleeping pills or medicines that cause drowsiness. ? Make important decisions or sign legal documents. ? Take care of children on  your own.  Rest. Eating and drinking  If you vomit, drink water, juice, or soup when you can drink without vomiting.  Drink enough fluid to keep your urine clear or pale yellow.  Make sure you have little or no nausea before eating solid foods.  Follow the diet recommended by your health care provider. General instructions  Have a responsible adult stay with you until you are awake and alert.  Return to your normal activities as told by your health care provider. Ask your health care provider what activities are safe for you.  Take over-the-counter and prescription medicines only as told by your health care provider.  If you smoke, do not smoke without supervision.  Keep all follow-up visits as told by your health care provider. This is important. Contact a health care provider if:  You continue to have nausea or vomiting at home, and medicines are not helpful.  You cannot drink fluids or start eating again.  You cannot urinate after 8-12 hours.  You develop a skin rash.  You have fever.  You have increasing redness at the site of your procedure. Get help right away if:  You have difficulty breathing.  You have chest pain.  You have unexpected bleeding.  You feel that you are having a life-threatening or urgent problem. This information is not intended to replace advice given to you by your health care provider. Make sure you discuss any questions you have with your health care provider. Document Released: 10/20/2000 Document Revised: 12/17/2015 Document Reviewed: 06/28/2015 Elsevier Interactive Patient Education  [employer].

## 2017-09-14 NOTE — Anesthesia Postprocedure Evaluation (Signed)
Anesthesia Post Note  Patient: Kyle Collins  Procedure(s) Performed: CIRCUMCISION (N/A )  Patient location during evaluation: PACU Anesthesia Type: General Level of consciousness: awake and alert and patient cooperative Pain management: satisfactory to patient Vital Signs Assessment: post-procedure vital signs reviewed and stable Respiratory status: spontaneous breathing Cardiovascular status: stable Postop Assessment: no apparent nausea or vomiting Anesthetic complications: no     Last Vitals:  Vitals:   09/14/17 1044 09/14/17 1047  BP:  (!) 123/92  Pulse: 94   Resp: 16   Temp: 36.9 C   SpO2: 96% 98%    Last Pain:  Vitals:   09/14/17 1044  TempSrc: Oral  PainSc: 4                  Simra Fiebig

## 2017-09-15 ENCOUNTER — Encounter (HOSPITAL_COMMUNITY): Payer: Self-pay | Admitting: Urology

## 2017-09-18 DIAGNOSIS — Z7951 Long term (current) use of inhaled steroids: Secondary | ICD-10-CM | POA: Diagnosis not present

## 2017-09-18 DIAGNOSIS — Z79899 Other long term (current) drug therapy: Secondary | ICD-10-CM | POA: Diagnosis not present

## 2017-09-18 DIAGNOSIS — E1165 Type 2 diabetes mellitus with hyperglycemia: Secondary | ICD-10-CM | POA: Diagnosis not present

## 2017-09-18 DIAGNOSIS — Z7984 Long term (current) use of oral hypoglycemic drugs: Secondary | ICD-10-CM | POA: Diagnosis not present

## 2017-09-18 DIAGNOSIS — K8020 Calculus of gallbladder without cholecystitis without obstruction: Secondary | ICD-10-CM | POA: Diagnosis not present

## 2017-09-18 DIAGNOSIS — K760 Fatty (change of) liver, not elsewhere classified: Secondary | ICD-10-CM | POA: Diagnosis not present

## 2017-09-18 DIAGNOSIS — I10 Essential (primary) hypertension: Secondary | ICD-10-CM | POA: Diagnosis not present

## 2017-09-18 DIAGNOSIS — F319 Bipolar disorder, unspecified: Secondary | ICD-10-CM | POA: Diagnosis not present

## 2017-09-18 DIAGNOSIS — K219 Gastro-esophageal reflux disease without esophagitis: Secondary | ICD-10-CM | POA: Diagnosis not present

## 2017-09-18 DIAGNOSIS — R718 Other abnormality of red blood cells: Secondary | ICD-10-CM | POA: Diagnosis not present

## 2017-09-18 DIAGNOSIS — Z87891 Personal history of nicotine dependence: Secondary | ICD-10-CM | POA: Diagnosis not present

## 2017-09-18 DIAGNOSIS — Z9119 Patient's noncompliance with other medical treatment and regimen: Secondary | ICD-10-CM | POA: Diagnosis not present

## 2017-09-18 DIAGNOSIS — R799 Abnormal finding of blood chemistry, unspecified: Secondary | ICD-10-CM | POA: Diagnosis not present

## 2017-09-18 DIAGNOSIS — Z6841 Body Mass Index (BMI) 40.0 and over, adult: Secondary | ICD-10-CM | POA: Diagnosis not present

## 2017-09-18 DIAGNOSIS — Z79891 Long term (current) use of opiate analgesic: Secondary | ICD-10-CM | POA: Diagnosis not present

## 2017-09-18 DIAGNOSIS — F909 Attention-deficit hyperactivity disorder, unspecified type: Secondary | ICD-10-CM | POA: Diagnosis not present

## 2017-09-18 DIAGNOSIS — R51 Headache: Secondary | ICD-10-CM | POA: Diagnosis not present

## 2017-09-18 DIAGNOSIS — E039 Hypothyroidism, unspecified: Secondary | ICD-10-CM | POA: Diagnosis not present

## 2017-09-18 DIAGNOSIS — G4733 Obstructive sleep apnea (adult) (pediatric): Secondary | ICD-10-CM | POA: Diagnosis not present

## 2017-09-18 DIAGNOSIS — D751 Secondary polycythemia: Secondary | ICD-10-CM | POA: Diagnosis not present

## 2017-09-24 DIAGNOSIS — I10 Essential (primary) hypertension: Secondary | ICD-10-CM | POA: Diagnosis not present

## 2017-09-24 DIAGNOSIS — G4733 Obstructive sleep apnea (adult) (pediatric): Secondary | ICD-10-CM | POA: Diagnosis not present

## 2017-09-24 DIAGNOSIS — R51 Headache: Secondary | ICD-10-CM | POA: Diagnosis not present

## 2017-09-24 DIAGNOSIS — F909 Attention-deficit hyperactivity disorder, unspecified type: Secondary | ICD-10-CM | POA: Diagnosis not present

## 2017-09-24 DIAGNOSIS — Z79899 Other long term (current) drug therapy: Secondary | ICD-10-CM | POA: Diagnosis not present

## 2017-09-24 DIAGNOSIS — E039 Hypothyroidism, unspecified: Secondary | ICD-10-CM | POA: Diagnosis not present

## 2017-09-24 DIAGNOSIS — E119 Type 2 diabetes mellitus without complications: Secondary | ICD-10-CM | POA: Diagnosis not present

## 2017-09-24 DIAGNOSIS — K219 Gastro-esophageal reflux disease without esophagitis: Secondary | ICD-10-CM | POA: Diagnosis not present

## 2017-09-24 DIAGNOSIS — E1165 Type 2 diabetes mellitus with hyperglycemia: Secondary | ICD-10-CM | POA: Diagnosis not present

## 2017-09-24 DIAGNOSIS — D751 Secondary polycythemia: Secondary | ICD-10-CM | POA: Diagnosis not present

## 2017-09-24 DIAGNOSIS — F319 Bipolar disorder, unspecified: Secondary | ICD-10-CM | POA: Diagnosis not present

## 2017-09-30 ENCOUNTER — Ambulatory Visit: Payer: Medicare Other | Admitting: Urology

## 2017-09-30 DIAGNOSIS — D751 Secondary polycythemia: Secondary | ICD-10-CM | POA: Diagnosis not present

## 2017-10-01 ENCOUNTER — Other Ambulatory Visit: Payer: Self-pay | Admitting: "Endocrinology

## 2017-10-01 DIAGNOSIS — D751 Secondary polycythemia: Secondary | ICD-10-CM | POA: Diagnosis not present

## 2017-10-06 DIAGNOSIS — D751 Secondary polycythemia: Secondary | ICD-10-CM | POA: Diagnosis not present

## 2017-10-07 ENCOUNTER — Ambulatory Visit (INDEPENDENT_AMBULATORY_CARE_PROVIDER_SITE_OTHER): Payer: Medicare Other | Admitting: Internal Medicine

## 2017-10-08 ENCOUNTER — Encounter (INDEPENDENT_AMBULATORY_CARE_PROVIDER_SITE_OTHER): Payer: Self-pay | Admitting: Internal Medicine

## 2017-10-08 ENCOUNTER — Encounter (INDEPENDENT_AMBULATORY_CARE_PROVIDER_SITE_OTHER): Payer: Self-pay | Admitting: *Deleted

## 2017-10-08 ENCOUNTER — Ambulatory Visit (INDEPENDENT_AMBULATORY_CARE_PROVIDER_SITE_OTHER): Payer: Medicare Other | Admitting: Internal Medicine

## 2017-10-08 DIAGNOSIS — D751 Secondary polycythemia: Secondary | ICD-10-CM | POA: Diagnosis not present

## 2017-10-08 DIAGNOSIS — I10 Essential (primary) hypertension: Secondary | ICD-10-CM | POA: Diagnosis not present

## 2017-10-08 DIAGNOSIS — R1011 Right upper quadrant pain: Secondary | ICD-10-CM

## 2017-10-08 DIAGNOSIS — E1165 Type 2 diabetes mellitus with hyperglycemia: Secondary | ICD-10-CM | POA: Diagnosis not present

## 2017-10-08 LAB — CBC WITH DIFFERENTIAL/PLATELET
BASOS PCT: 0.8 %
Basophils Absolute: 58 cells/uL (ref 0–200)
EOS PCT: 2.1 %
Eosinophils Absolute: 151 cells/uL (ref 15–500)
HEMATOCRIT: 42.6 % (ref 38.5–50.0)
HEMOGLOBIN: 14.6 g/dL (ref 13.2–17.1)
LYMPHS ABS: 3161 {cells}/uL (ref 850–3900)
MCH: 30.4 pg (ref 27.0–33.0)
MCHC: 34.3 g/dL (ref 32.0–36.0)
MCV: 88.8 fL (ref 80.0–100.0)
MPV: 10.7 fL (ref 7.5–12.5)
Monocytes Relative: 8.3 %
NEUTROS ABS: 3233 {cells}/uL (ref 1500–7800)
Neutrophils Relative %: 44.9 %
Platelets: 302 10*3/uL (ref 140–400)
RBC: 4.8 10*6/uL (ref 4.20–5.80)
RDW: 12.6 % (ref 11.0–15.0)
Total Lymphocyte: 43.9 %
WBC mixed population: 598 cells/uL (ref 200–950)
WBC: 7.2 10*3/uL (ref 3.8–10.8)

## 2017-10-08 LAB — HEPATIC FUNCTION PANEL
AG Ratio: 1.8 (calc) (ref 1.0–2.5)
ALBUMIN MSPROF: 4.1 g/dL (ref 3.6–5.1)
ALT: 36 U/L (ref 9–46)
AST: 22 U/L (ref 10–40)
Alkaline phosphatase (APISO): 58 U/L (ref 40–115)
Bilirubin, Direct: 0.1 mg/dL (ref 0.0–0.2)
Globulin: 2.3 g/dL (calc) (ref 1.9–3.7)
Indirect Bilirubin: 0.8 mg/dL (calc) (ref 0.2–1.2)
Total Bilirubin: 0.9 mg/dL (ref 0.2–1.2)
Total Protein: 6.4 g/dL (ref 6.1–8.1)

## 2017-10-08 LAB — AMYLASE: Amylase: 161 U/L — ABNORMAL HIGH (ref 21–101)

## 2017-10-08 LAB — LIPASE: Lipase: 322 U/L — ABNORMAL HIGH (ref 7–60)

## 2017-10-08 NOTE — Patient Instructions (Signed)
Labs and HIDA scan.

## 2017-10-08 NOTE — Progress Notes (Signed)
Subjective:    Patient ID: [name], male    DOB: 06-22-1986, 32 y.o.   MRN: [id number]  HPI Referred by Rodman Comp FNP for elevated amylase.Marland Kitchen Apparently he was also having RUQ pain in January. Underwent a US abdomen which showed gallstones. Amylase and Lipase were elevated.  He is not on any particular diet. He does not eat greasy foods. He will have RUQ pain after eating.  Pain occurs about 20 minutes after eating. Has some nausea and vomiting. There has been no weight gain.  Hx of diabetes since age 76. Hx of hemochromatosis and has phlebotomy weekly at the [facility]. Has had 2 phlebotomies. Per [facility] records is being evaluated for polycythemia.    09/02/2017 Amylase 117. Lipase 202.  Cholesterol 210, triglercides 426, H and H 17.8 and 51.7 Total bili 1.4, AST 20, ALT 36, ALT 78  09/04/2017: US abdomen complete: Generalized abdominal pain' Cholelithiasis without evidence of acute cholecystitis. Fatty infiltration of the liver. No acute findings. CBD 6 mm.   Blood sugars run about 180-200   Review of Systems Past Medical History:  Diagnosis Date  . Anxiety   . Bipolar disorder ([place])   . Depression   . Diabetes mellitus   . GERD (gastroesophageal reflux disease)   . Headache   . Hypertension   . Sleep apnea   . Thyroid disease     Past Surgical History:  Procedure Laterality Date  . CIRCUMCISION N/A 09/14/2017   Procedure: CIRCUMCISION;  Surgeon: Cleon Gustin, MD;  Location: [facility];  Service: Urology;  Laterality: N/A;  1 HR 336 [phone] - He has a Education officer, museum that needs to be called if changes made Kyle Collins @ [place] [facility]  . HERNIA REPAIR     scrotum  . ORIF SHOULDER FRACTURE Right 02/28/2016   Procedure: OPEN REDUCTION INTERNAL FIXATION (ORIF) SHOULDER FRACTURE;  Surgeon: Carole Civil, MD;  Location: [facility];  Service: Orthopedics;  Laterality: Right;  . SHOULDER CLOSED REDUCTION Right 12/04/2015   Procedure: CLOSED REDUCTION SHOULDER;  Surgeon: Carole Civil, MD;  Location: [facility];  Service: Orthopedics;  Laterality: Right;  . TOOTH EXTRACTION  spring 2016   top left     Allergies  Allergen Reactions  . Methylphenidate Derivatives Other (See Comments)    Patient states it made him "act like a zombie" when given as a child  . Ritalin [Methylphenidate Hcl]     Current Outpatient Medications on File Prior to Visit  Medication Sig Dispense Refill  . amLODipine (NORVASC) 2.5 MG tablet Take 2.5 mg by mouth daily.    . empagliflozin (JARDIANCE) 10 MG TABS tablet Take 10 mg by mouth daily. 30 tablet 3  . escitalopram (LEXAPRO) 20 MG tablet Take 20 mg by mouth daily.    . fluticasone (FLONASE) 50 MCG/ACT nasal spray Place 1 spray into both nostrils daily as needed for allergies or rhinitis.    . hydrochlorothiazide (HYDRODIURIL) 25 MG tablet TAKE ONE TABLET BY MOUTH DAILY. 30 tablet 2  . lamoTRIgine (LAMICTAL) 100 MG tablet Take 1 tablet by mouth 2 (two) times daily.     Marland Kitchen lisinopril (PRINIVIL,ZESTRIL) 40 MG tablet Take 1 tablet (40 mg total) by mouth daily. 30 tablet 1  . metFORMIN (GLUCOPHAGE-XR) 500 MG 24 hr tablet Take 500 mg by mouth 2 (two) times daily.     Marland Kitchen tiZANidine (ZANAFLEX) 4 MG capsule Take 4 mg by mouth 3 (three) times daily as needed for muscle  spasms.    . traZODone (DESYREL) 150 MG tablet Take 75-150 mg by mouth at bedtime as needed for sleep.     . blood glucose meter kit and supplies KIT Dispense based on patient and insurance preference. Use up to 2 times daily as directed. (FOR ICD-10 E11.65) 1 each 0   No current facility-administered medications on file prior to visit.         Objective:   Physical Exam Blood pressure (!) 160/92, pulse 72, temperature 98.9 F (37.2 C), height _0  (1.803 m), weight (!) 342 lb 6.4 oz (155.3 kg). Alert and oriented. Skin warm and dry. Oral mucosa is moist.   . Sclera anicteric, conjunctivae is pink. Thyroid not enlarged.  No cervical lymphadenopathy. Lungs clear. Heart regular rate and rhythm.  Abdomen is soft. Bowel sounds are positive. No hepatomegaly. No abdominal masses felt. No tenderness.  No edema to lower extremities.  Morbidily obese         Assessment & Plan:  RUQ pain. Hx of gallstones. Will get a HIDA scan, Hepatic function, amylase, Lipase Further recommendations to follow.

## 2017-10-12 ENCOUNTER — Telehealth (INDEPENDENT_AMBULATORY_CARE_PROVIDER_SITE_OTHER): Payer: Self-pay | Admitting: Internal Medicine

## 2017-10-12 ENCOUNTER — Encounter (HOSPITAL_COMMUNITY)
Admission: RE | Admit: 2017-10-12 | Discharge: 2017-10-12 | Disposition: A | Discharge status: Home / Self Care | Payer: Medicare Other | Source: Ambulatory Visit | Attending: Internal Medicine | Admitting: Internal Medicine

## 2017-10-12 ENCOUNTER — Encounter (HOSPITAL_COMMUNITY): Payer: Self-pay

## 2017-10-12 DIAGNOSIS — R1011 Right upper quadrant pain: Secondary | ICD-10-CM | POA: Insufficient documentation

## 2017-10-12 DIAGNOSIS — R109 Unspecified abdominal pain: Secondary | ICD-10-CM | POA: Diagnosis not present

## 2017-10-12 DIAGNOSIS — R748 Abnormal levels of other serum enzymes: Secondary | ICD-10-CM

## 2017-10-12 MED ORDER — MORPHINE SULFATE (PF) 4 MG/ML IV SOLN
4.0000 mg | Freq: Once | INTRAVENOUS | Status: AC
  Administered 2017-10-12: 4 mg via INTRAVENOUS

## 2017-10-12 MED ORDER — MORPHINE SULFATE (PF) 4 MG/ML IV SOLN
INTRAVENOUS | Status: AC
  Administered 2017-10-12: 4 mg via INTRAVENOUS
  Filled 2017-10-12: qty 1

## 2017-10-12 MED ORDER — TECHNETIUM TC 99M MEBROFENIN IV KIT
5.0000 | PACK | Freq: Once | INTRAVENOUS | Status: AC | PRN
  Administered 2017-10-12: 5.3 via INTRAVENOUS

## 2017-10-12 NOTE — Telephone Encounter (Signed)
Ann, CT abdomen/pelvis with CM 

## 2017-10-13 DIAGNOSIS — D751 Secondary polycythemia: Secondary | ICD-10-CM | POA: Diagnosis not present

## 2017-10-13 NOTE — Telephone Encounter (Signed)
CT sch'd 10/27/17 at 10 (945), npo 4 hrs, pick up contrast, patient aware

## 2017-10-14 ENCOUNTER — Encounter: Payer: Self-pay | Admitting: Neurology

## 2017-10-21 ENCOUNTER — Ambulatory Visit (INDEPENDENT_AMBULATORY_CARE_PROVIDER_SITE_OTHER): Payer: Medicare Other | Admitting: Urology

## 2017-10-21 DIAGNOSIS — N471 Phimosis: Secondary | ICD-10-CM

## 2017-10-27 ENCOUNTER — Ambulatory Visit (HOSPITAL_COMMUNITY): Payer: Medicare Other

## 2017-10-30 ENCOUNTER — Ambulatory Visit (HOSPITAL_COMMUNITY)
Admission: RE | Admit: 2017-10-30 | Discharge: 2017-10-30 | Disposition: A | Discharge status: Home / Self Care | Payer: Medicare Other | Source: Ambulatory Visit | Attending: Internal Medicine | Admitting: Internal Medicine

## 2017-10-30 DIAGNOSIS — K760 Fatty (change of) liver, not elsewhere classified: Secondary | ICD-10-CM | POA: Insufficient documentation

## 2017-10-30 DIAGNOSIS — R1011 Right upper quadrant pain: Secondary | ICD-10-CM | POA: Diagnosis not present

## 2017-10-30 DIAGNOSIS — R748 Abnormal levels of other serum enzymes: Secondary | ICD-10-CM | POA: Insufficient documentation

## 2017-10-30 DIAGNOSIS — K8020 Calculus of gallbladder without cholecystitis without obstruction: Secondary | ICD-10-CM | POA: Diagnosis not present

## 2017-10-30 LAB — POCT I-STAT CREATININE: Creatinine, Ser: 0.7 mg/dL (ref 0.61–1.24)

## 2017-10-30 MED ORDER — IOPAMIDOL (ISOVUE-300) INJECTION 61%
100.0000 mL | Freq: Once | INTRAVENOUS | Status: AC | PRN
  Administered 2017-10-30: 100 mL via INTRAVENOUS

## 2017-11-03 ENCOUNTER — Other Ambulatory Visit (INDEPENDENT_AMBULATORY_CARE_PROVIDER_SITE_OTHER): Payer: Self-pay | Admitting: *Deleted

## 2017-11-03 ENCOUNTER — Encounter (INDEPENDENT_AMBULATORY_CARE_PROVIDER_SITE_OTHER): Payer: Self-pay | Admitting: *Deleted

## 2017-11-03 ENCOUNTER — Telehealth (INDEPENDENT_AMBULATORY_CARE_PROVIDER_SITE_OTHER): Payer: Self-pay | Admitting: Internal Medicine

## 2017-11-03 DIAGNOSIS — E1169 Type 2 diabetes mellitus with other specified complication: Secondary | ICD-10-CM | POA: Diagnosis not present

## 2017-11-03 DIAGNOSIS — R748 Abnormal levels of other serum enzymes: Secondary | ICD-10-CM

## 2017-11-03 NOTE — Telephone Encounter (Signed)
Amylase and Lipase have been ordered and a letter has been sent to the patient as a reminder.

## 2017-11-03 NOTE — Telephone Encounter (Signed)
Kyle Collins, amylase and lipase in 2 weeks. Please send letter

## 2017-11-05 DIAGNOSIS — E1165 Type 2 diabetes mellitus with hyperglycemia: Secondary | ICD-10-CM | POA: Diagnosis not present

## 2017-11-05 DIAGNOSIS — D751 Secondary polycythemia: Secondary | ICD-10-CM | POA: Diagnosis not present

## 2017-11-05 DIAGNOSIS — I10 Essential (primary) hypertension: Secondary | ICD-10-CM | POA: Diagnosis not present

## 2017-11-09 DIAGNOSIS — Z7984 Long term (current) use of oral hypoglycemic drugs: Secondary | ICD-10-CM | POA: Diagnosis not present

## 2017-11-09 DIAGNOSIS — K219 Gastro-esophageal reflux disease without esophagitis: Secondary | ICD-10-CM | POA: Diagnosis not present

## 2017-11-09 DIAGNOSIS — E039 Hypothyroidism, unspecified: Secondary | ICD-10-CM | POA: Diagnosis not present

## 2017-11-09 DIAGNOSIS — Z6841 Body Mass Index (BMI) 40.0 and over, adult: Secondary | ICD-10-CM | POA: Diagnosis not present

## 2017-11-09 DIAGNOSIS — Z888 Allergy status to other drugs, medicaments and biological substances status: Secondary | ICD-10-CM | POA: Diagnosis not present

## 2017-11-09 DIAGNOSIS — F319 Bipolar disorder, unspecified: Secondary | ICD-10-CM | POA: Diagnosis not present

## 2017-11-09 DIAGNOSIS — D751 Secondary polycythemia: Secondary | ICD-10-CM | POA: Diagnosis not present

## 2017-11-09 DIAGNOSIS — I10 Essential (primary) hypertension: Secondary | ICD-10-CM | POA: Diagnosis not present

## 2017-11-09 DIAGNOSIS — E119 Type 2 diabetes mellitus without complications: Secondary | ICD-10-CM | POA: Diagnosis not present

## 2017-11-09 DIAGNOSIS — R7989 Other specified abnormal findings of blood chemistry: Secondary | ICD-10-CM | POA: Diagnosis not present

## 2017-11-24 DIAGNOSIS — R7989 Other specified abnormal findings of blood chemistry: Secondary | ICD-10-CM | POA: Diagnosis not present

## 2017-11-24 DIAGNOSIS — D751 Secondary polycythemia: Secondary | ICD-10-CM | POA: Diagnosis not present

## 2017-12-02 ENCOUNTER — Ambulatory Visit (INDEPENDENT_AMBULATORY_CARE_PROVIDER_SITE_OTHER): Payer: Medicare Other | Admitting: Urology

## 2017-12-02 DIAGNOSIS — N471 Phimosis: Secondary | ICD-10-CM

## 2017-12-04 DIAGNOSIS — I10 Essential (primary) hypertension: Secondary | ICD-10-CM | POA: Diagnosis not present

## 2017-12-09 DIAGNOSIS — D751 Secondary polycythemia: Secondary | ICD-10-CM | POA: Diagnosis not present

## 2017-12-09 DIAGNOSIS — R7989 Other specified abnormal findings of blood chemistry: Secondary | ICD-10-CM | POA: Diagnosis not present

## 2017-12-11 DIAGNOSIS — E1165 Type 2 diabetes mellitus with hyperglycemia: Secondary | ICD-10-CM | POA: Diagnosis not present

## 2017-12-11 DIAGNOSIS — E039 Hypothyroidism, unspecified: Secondary | ICD-10-CM | POA: Diagnosis not present

## 2017-12-11 DIAGNOSIS — D751 Secondary polycythemia: Secondary | ICD-10-CM | POA: Diagnosis not present

## 2017-12-11 DIAGNOSIS — K219 Gastro-esophageal reflux disease without esophagitis: Secondary | ICD-10-CM | POA: Diagnosis not present

## 2017-12-11 DIAGNOSIS — F319 Bipolar disorder, unspecified: Secondary | ICD-10-CM | POA: Diagnosis not present

## 2017-12-11 DIAGNOSIS — Z9114 Patient's other noncompliance with medication regimen: Secondary | ICD-10-CM | POA: Diagnosis not present

## 2017-12-11 DIAGNOSIS — Z7984 Long term (current) use of oral hypoglycemic drugs: Secondary | ICD-10-CM | POA: Diagnosis not present

## 2017-12-11 DIAGNOSIS — I10 Essential (primary) hypertension: Secondary | ICD-10-CM | POA: Diagnosis not present

## 2017-12-11 DIAGNOSIS — G4730 Sleep apnea, unspecified: Secondary | ICD-10-CM | POA: Diagnosis not present

## 2017-12-17 DIAGNOSIS — E119 Type 2 diabetes mellitus without complications: Secondary | ICD-10-CM | POA: Diagnosis not present

## 2017-12-17 DIAGNOSIS — F3131 Bipolar disorder, current episode depressed, mild: Secondary | ICD-10-CM | POA: Diagnosis not present

## 2017-12-17 DIAGNOSIS — E782 Mixed hyperlipidemia: Secondary | ICD-10-CM | POA: Diagnosis not present

## 2017-12-17 DIAGNOSIS — I10 Essential (primary) hypertension: Secondary | ICD-10-CM | POA: Diagnosis not present

## 2017-12-25 DIAGNOSIS — D751 Secondary polycythemia: Secondary | ICD-10-CM | POA: Diagnosis not present

## 2018-01-08 DIAGNOSIS — F329 Major depressive disorder, single episode, unspecified: Secondary | ICD-10-CM | POA: Diagnosis not present

## 2018-01-08 DIAGNOSIS — E119 Type 2 diabetes mellitus without complications: Secondary | ICD-10-CM | POA: Diagnosis not present

## 2018-01-08 DIAGNOSIS — Z7984 Long term (current) use of oral hypoglycemic drugs: Secondary | ICD-10-CM | POA: Diagnosis not present

## 2018-01-08 DIAGNOSIS — D751 Secondary polycythemia: Secondary | ICD-10-CM | POA: Diagnosis not present

## 2018-01-08 DIAGNOSIS — I10 Essential (primary) hypertension: Secondary | ICD-10-CM | POA: Diagnosis not present

## 2018-01-08 DIAGNOSIS — R7989 Other specified abnormal findings of blood chemistry: Secondary | ICD-10-CM | POA: Diagnosis not present

## 2018-01-08 DIAGNOSIS — Z79899 Other long term (current) drug therapy: Secondary | ICD-10-CM | POA: Diagnosis not present

## 2018-01-08 DIAGNOSIS — E039 Hypothyroidism, unspecified: Secondary | ICD-10-CM | POA: Diagnosis not present

## 2018-01-27 ENCOUNTER — Encounter (HOSPITAL_COMMUNITY): Payer: Self-pay

## 2018-01-27 ENCOUNTER — Ambulatory Visit (HOSPITAL_COMMUNITY): Payer: Medicare Other

## 2018-01-27 ENCOUNTER — Ambulatory Visit (HOSPITAL_COMMUNITY)
Admission: RE | Admit: 2018-01-27 | Discharge: 2018-01-27 | Disposition: A | Discharge status: Home / Self Care | Payer: Medicare Other | Source: Ambulatory Visit | Attending: Otolaryngology | Admitting: Otolaryngology

## 2018-01-27 DIAGNOSIS — J329 Chronic sinusitis, unspecified: Secondary | ICD-10-CM | POA: Diagnosis present

## 2018-01-27 DIAGNOSIS — R6884 Jaw pain: Secondary | ICD-10-CM | POA: Diagnosis not present

## 2018-01-27 DIAGNOSIS — J3489 Other specified disorders of nose and nasal sinuses: Secondary | ICD-10-CM | POA: Diagnosis not present

## 2018-01-27 DIAGNOSIS — J320 Chronic maxillary sinusitis: Secondary | ICD-10-CM | POA: Insufficient documentation

## 2018-02-10 ENCOUNTER — Ambulatory Visit (INDEPENDENT_AMBULATORY_CARE_PROVIDER_SITE_OTHER): Payer: Medicare Other | Admitting: Urology

## 2018-02-10 DIAGNOSIS — N471 Phimosis: Secondary | ICD-10-CM | POA: Diagnosis not present

## 2018-02-19 DIAGNOSIS — F909 Attention-deficit hyperactivity disorder, unspecified type: Secondary | ICD-10-CM | POA: Diagnosis not present

## 2018-02-19 DIAGNOSIS — Z888 Allergy status to other drugs, medicaments and biological substances status: Secondary | ICD-10-CM | POA: Diagnosis not present

## 2018-02-19 DIAGNOSIS — F17290 Nicotine dependence, other tobacco product, uncomplicated: Secondary | ICD-10-CM | POA: Diagnosis not present

## 2018-02-19 DIAGNOSIS — Z7984 Long term (current) use of oral hypoglycemic drugs: Secondary | ICD-10-CM | POA: Diagnosis not present

## 2018-02-19 DIAGNOSIS — E118 Type 2 diabetes mellitus with unspecified complications: Secondary | ICD-10-CM | POA: Diagnosis not present

## 2018-02-19 DIAGNOSIS — F319 Bipolar disorder, unspecified: Secondary | ICD-10-CM | POA: Diagnosis not present

## 2018-02-19 DIAGNOSIS — R51 Headache: Secondary | ICD-10-CM | POA: Diagnosis not present

## 2018-02-19 DIAGNOSIS — J339 Nasal polyp, unspecified: Secondary | ICD-10-CM | POA: Diagnosis not present

## 2018-02-19 DIAGNOSIS — Z9119 Patient's noncompliance with other medical treatment and regimen: Secondary | ICD-10-CM | POA: Diagnosis not present

## 2018-02-19 DIAGNOSIS — D751 Secondary polycythemia: Secondary | ICD-10-CM | POA: Diagnosis not present

## 2018-02-19 DIAGNOSIS — Z6841 Body Mass Index (BMI) 40.0 and over, adult: Secondary | ICD-10-CM | POA: Diagnosis not present

## 2018-02-19 DIAGNOSIS — G4733 Obstructive sleep apnea (adult) (pediatric): Secondary | ICD-10-CM | POA: Diagnosis not present

## 2018-02-19 DIAGNOSIS — F4310 Post-traumatic stress disorder, unspecified: Secondary | ICD-10-CM | POA: Diagnosis not present

## 2018-02-19 DIAGNOSIS — K219 Gastro-esophageal reflux disease without esophagitis: Secondary | ICD-10-CM | POA: Diagnosis not present

## 2018-02-19 DIAGNOSIS — M2550 Pain in unspecified joint: Secondary | ICD-10-CM | POA: Diagnosis not present

## 2018-02-19 DIAGNOSIS — G8929 Other chronic pain: Secondary | ICD-10-CM | POA: Diagnosis not present

## 2018-02-19 DIAGNOSIS — I10 Essential (primary) hypertension: Secondary | ICD-10-CM | POA: Diagnosis not present

## 2018-02-19 DIAGNOSIS — E039 Hypothyroidism, unspecified: Secondary | ICD-10-CM | POA: Diagnosis not present

## 2018-02-19 DIAGNOSIS — Z79899 Other long term (current) drug therapy: Secondary | ICD-10-CM | POA: Diagnosis not present

## 2018-02-19 DIAGNOSIS — R7989 Other specified abnormal findings of blood chemistry: Secondary | ICD-10-CM | POA: Diagnosis not present

## 2018-02-19 DIAGNOSIS — Z7951 Long term (current) use of inhaled steroids: Secondary | ICD-10-CM | POA: Diagnosis not present

## 2018-02-24 DIAGNOSIS — D751 Secondary polycythemia: Secondary | ICD-10-CM | POA: Diagnosis not present

## 2018-03-03 ENCOUNTER — Ambulatory Visit (INDEPENDENT_AMBULATORY_CARE_PROVIDER_SITE_OTHER): Payer: Medicare Other | Admitting: Neurology

## 2018-03-03 ENCOUNTER — Encounter: Payer: Self-pay | Admitting: Neurology

## 2018-03-03 VITALS — BP 141/93 | HR 96 | Ht 71.0 in | Wt 346.5 lb

## 2018-03-03 DIAGNOSIS — R351 Nocturia: Secondary | ICD-10-CM

## 2018-03-03 DIAGNOSIS — G4733 Obstructive sleep apnea (adult) (pediatric): Secondary | ICD-10-CM | POA: Diagnosis not present

## 2018-03-03 DIAGNOSIS — Z820 Family history of epilepsy and other diseases of the nervous system: Secondary | ICD-10-CM | POA: Diagnosis not present

## 2018-03-03 DIAGNOSIS — R51 Headache: Secondary | ICD-10-CM | POA: Diagnosis not present

## 2018-03-03 DIAGNOSIS — Z6841 Body Mass Index (BMI) 40.0 and over, adult: Secondary | ICD-10-CM | POA: Diagnosis not present

## 2018-03-03 DIAGNOSIS — D751 Secondary polycythemia: Secondary | ICD-10-CM

## 2018-03-03 DIAGNOSIS — R519 Headache, unspecified: Secondary | ICD-10-CM

## 2018-03-03 NOTE — Progress Notes (Signed)
Subjective:    Patient ID: Kyle Collins is a 32 y.o. male.  HPI     Star Age, MD, PhD [facility] [address], [address] [place], [place] [address]  Dear Dr. Federico Flake,   I saw your patient, Kyle Collins, upon your kind request in my neurologic clinic today for initial consultation of his sleep disorder, in particular, reevaluation of his prior diagnosis of obstructive sleep apnea. The patient is unaccompanied today. As you know, Kyle Collins is a 32 year old right-handed gentleman with an underlying medical history of hypertension, polycythemia requiring periodic therapeutic phlebotomies, reflux disease, diabetes, mood disorder, thyroid disease, and morbid obesity with a BMI of over 58, who was previously diagnosed with obstructive sleep apnea and placed on CPAP therapy. He no longer uses CPAP. He reports difficulty using CPAP in the past, primarily because he is a restless sleeper and would pull the mask off or it would dislodge. He was using a fullface mask. I reviewed your office note from 12/11/2017. Prior sleep study results are not available for my review today. A CPAP compliance download was reviewed today, he has not used CPAP since March 2019.  His pressure was 12 cm.  His leak was on the high side at 22 L/min. His Epworth sleepiness score is 6 out of 24, fatigue score is 16 out of 63. He is single and lives alone, no children. He does not currently work. He quit smoking in 2014, and does not utilize alcohol and does not drink caffeine on a daily basis. He was diagnosed with diabetes when he was 32 years old. He has been on oral diabetic medication only. His A1c has improved with time. His mother has sleep apnea. He would be willing to get retested and try CPAP again. Sleep study testing was over 5 years ago as he recalls. His bedtime varies significantly, can be as early as 6 PM and as late as 5 AM. He has nocturia about 3-4 times per average night. He has  had some morning headaches. He is working on weight loss.  His Past Medical History Is Significant For: Past Medical History:  Diagnosis Date  . Anxiety   . Bipolar disorder ([place])   . Depression   . Diabetes mellitus   . GERD (gastroesophageal reflux disease)   . Headache   . Hypertension   . Polycythemia   . Sleep apnea   . Thyroid disease     His Past Surgical History Is Significant For: Past Surgical History:  Procedure Laterality Date  . CIRCUMCISION N/A 09/14/2017   Procedure: CIRCUMCISION;  Surgeon: Cleon Gustin, MD;  Location: [facility];  Service: Urology;  Laterality: N/A;  1 HR 336 [phone] - He has a Education officer, museum that needs to be called if changes made Mate[employer] [facility] [employer] [facility]  . HERNIA REPAIR     scrotum  . ORIF SHOULDER FRACTURE Right 02/28/2016   Procedure: OPEN REDUCTION INTERNAL FIXATION (ORIF) SHOULDER FRACTURE;  Surgeon: Carole Civil, MD;  Location: [facility];  Service: Orthopedics;  Laterality: Right;  . SHOULDER CLOSED REDUCTION Right 12/04/2015   Procedure: CLOSED REDUCTION SHOULDER;  Surgeon: Carole Civil, MD;  Location: [facility];  Service: Orthopedics;  Laterality: Right;  . TOOTH EXTRACTION  spring 2016   top left     His Family History Is Significant For: Family History  Problem Relation Age of Onset  . Diabetes Mother   . Diabetes Father  His Social History Is Significant For: Social History   Socioeconomic History  . Marital status: Single    Spouse name: Not on file  . Number of children: Not on file  . Years of education: Not on file  . Highest education level: Not on file  Occupational History  . Not on file  Social Needs  . Financial resource strain: Not on file  . Food insecurity:    Worry: Not on file    Inability: Not on file  . Transportation needs:    Medical: Not on file    Non-medical: Not on file  Tobacco Use  . Smoking status: Former Smoker    Packs/day: 0.50    Years:  5.00    Pack years: 2.50    Types: E-cigarettes    Last attempt to quit: 02/24/2009    Years since quitting: 9.0  . Smokeless tobacco: Never Used  Substance and Sexual Activity  . Alcohol use: Yes    Comment: once year  . Drug use: No  . Sexual activity: Never  Lifestyle  . Physical activity:    Days per week: Not on file    Minutes per session: Not on file  . Stress: Not on file  Relationships  . Social connections:    Talks on phone: Not on file    Gets together: Not on file    Attends religious service: Not on file    Active member of club or organization: Not on file    Attends meetings of clubs or organizations: Not on file    Relationship status: Not on file  Other Topics Concern  . Not on file  Social History Narrative  . Not on file    His Allergies Are:  Allergies  Allergen Reactions  . Methylphenidate Derivatives Other (See Comments)    Patient states it made him "act like a zombie" when given as a child  . Ritalin [Methylphenidate Hcl]   :   His Current Medications Are:  Outpatient Encounter Medications as of 03/03/2018  Medication Sig  . amLODipine (NORVASC) 2.5 MG tablet Take 2.5 mg by mouth daily.  . blood glucose meter kit and supplies KIT Dispense based on patient and insurance preference. Use up to 2 times daily as directed. (FOR ICD-10 E11.65)  . citalopram (CELEXA) 20 MG tablet Take 1 tablet by mouth daily.  . empagliflozin (JARDIANCE) 10 MG TABS tablet Take 10 mg by mouth daily.  Marland Kitchen escitalopram (LEXAPRO) 20 MG tablet Take 20 mg by mouth daily.  . fluticasone (FLONASE) 50 MCG/ACT nasal spray Place 1 spray into both nostrils daily as needed for allergies or rhinitis.  . hydrochlorothiazide (HYDRODIURIL) 25 MG tablet TAKE ONE TABLET BY MOUTH DAILY.  Marland Kitchen lisinopril (PRINIVIL,ZESTRIL) 40 MG tablet Take 1 tablet (40 mg total) by mouth daily.  . metFORMIN (GLUCOPHAGE-XR) 500 MG 24 hr tablet Take 500 mg by mouth 2 (two) times daily.   Marland Kitchen tiZANidine (ZANAFLEX) 4  MG capsule Take 4 mg by mouth 3 (three) times daily as needed for muscle spasms.  . traZODone (DESYREL) 150 MG tablet Take 75-150 mg by mouth at bedtime as needed for sleep.   . [DISCONTINUED] lamoTRIgine (LAMICTAL) 100 MG tablet Take 1 tablet by mouth 2 (two) times daily.    No facility-administered encounter medications on file as of 03/03/2018.   :  Review of Systems:  Out of a complete 14 point review of systems, all are reviewed and negative with the exception of these symptoms  as listed below: Review of Systems  Neurological:       Epworth Sleepiness Scale 0= would never doze 1= slight chance of dozing 2= moderate chance of dozing 3= high chance of dozing  Sitting and reading:0 Watching TV:1 Sitting inactive in a public place (ex. Theater or meeting):0 As a passenger in a car for an hour without a break:0 Lying down to rest in the afternoon:2 Sitting and talking to someone:0 Sitting quietly after lunch (no alcohol):3 In a car, while stopped in traffic:0 Total:6    Objective:  Neurological Exam  Physical Exam Physical Examination:   Vitals:   03/03/18 1306  BP: (!) 141/93  Pulse: 96    General Examination: The patient is a very pleasant 32 y.o. male in no acute distress. He appears well-developed and well-nourished and well groomed.   HEENT: Normocephalic, atraumatic, pupils are equal, round and reactive to light and accommodation. Extraocular tracking is good without limitation to gaze excursion or nystagmus noted. Normal smooth pursuit is noted. Corrective eye glasses in place. Hearing is grossly intact. Face is symmetric with normal facial animation and normal facial sensation. Speech is clear with no dysarthria noted. There is no hypophonia. There is no lip, neck/head, jaw or voice tremor. Neck is supple with full range of passive and active motion. There are no carotid bruits on auscultation. Oropharynx exam reveals: moderate mouth dryness, adequate dental hygiene  and moderate airway crowding, due to larger tonsils of 3+ on the R and 2+ on the L, longer uvula. Mallampati is class II. Tongue protrudes centrally and palate elevates symmetrically. Neck size is 20.5 inches. He has a Mild overbite. Nasal inspection reveals no significant nasal mucosal bogginess or redness and no septal deviation.   Chest: Clear to auscultation without wheezing, rhonchi or crackles noted.  Heart: S1+S2+0, regular and normal without murmurs, rubs or gallops noted.   Abdomen: Soft, non-tender and non-distended with normal bowel sounds appreciated on auscultation.  Extremities: There is no pitting edema in the distal lower extremities bilaterally. Pedal pulses are intact.  Skin: Warm and dry without trophic changes noted.  Musculoskeletal: exam reveals no obvious joint deformities, tenderness or joint swelling or erythema, with the exception of genua valgi and toes pointing outwards b/l.   Neurologically:  Mental status: The patient is awake, alert and oriented in all 4 spheres. His immediate and remote memory, attention, language skills and fund of knowledge are appropriate. There is no evidence of aphasia, agnosia, apraxia or anomia. Speech is clear with normal prosody and enunciation. Thought process is linear. Mood is normal and affect is normal.  Cranial nerves II - XII are as described above under HEENT exam. In addition: shoulder shrug is normal with equal shoulder height noted. Motor exam: Normal bulk, strength and tone is noted. There is no drift, tremor or rebound. Romberg is negative. Reflexes are 2+ throughout. Fine motor skills and coordination: intact with normal finger taps, normal hand movements, normal rapid alternating patting, normal foot taps and normal foot agility.  Cerebellar testing: No dysmetria or intention tremor on finger to nose testing. Heel to shin is unremarkable bilaterally. There is no truncal or gait ataxia.  Sensory exam: intact to light touch in  the upper and lower extremities.  Gait, station and balance: He stands with difficulty. No veering to one side is noted. No leaning to one side is noted. Posture is age-appropriate and stance is narrow based. Gait shows no limp. has mild difficulty with tandem walk is unremarkable.  Assessment and plan:   In summary, Kyle Collins is a very pleasant 32 y.o.-year old male with an underlying medical history of hypertension, polycythemia requiring periodic therapeutic phlebotomies, reflux disease, diabetes, mood disorder, thyroid disease, and morbid obesity with a BMI of over 49, who presents for re-evaluation of his prior diagnosis of obstructive sleep apnea (OSA). I had a [place] chat with the patient about my findings and the diagnosis of OSA, its prognosis and treatment options. We talked about medical treatments, surgical interventions and non-pharmacological approaches. I explained in particular the risks and ramifications of untreated moderate to severe OSA, especially with respect to developing cardiovascular disease down the Road, including congestive heart failure, difficult to treat hypertension, cardiac arrhythmias, or stroke. Even type 2 diabetes has, in part, been linked to untreated OSA. Symptoms of untreated OSA include daytime sleepiness, memory problems, mood irritability and mood disorder such as depression and anxiety, lack of energy, as well as recurrent headaches, especially morning headaches. We talked about trying to maintain a healthy lifestyle in general, as well as the importance of weight control. I encouraged the patient to eat healthy, exercise daily and keep well hydrated, to keep a scheduled bedtime and wake time routine, to not skip any meals and eat healthy snacks in between meals. I advised the patient not to drive when feeling sleepy. I recommended the following at this time: sleep study with potential positive airway pressure titration. (We will score hypopneas at 4%).   I  explained the sleep test procedure to the patient and also outlined possible surgical and non-surgical treatment options of OSA, including the use of a custom-made dental device (which would require a referral to a specialist dentist or oral surgeon), upper airway surgical options, such as pillar implants, radiofrequency surgery, tongue base surgery, and UPPP (which would involve a referral to an ENT surgeon). Rarely, jaw surgery such as mandibular advancement may be considered.  I also explained the CPAP treatment option to the patient, who indicated that he would be willing to retry CPAP. I explained the importance of being compliant with PAP treatment, not only for insurance purposes but primarily to improve His symptoms, and for the patient's [place] term health benefit, including to reduce His cardiovascular risks. I answered all his questions today and the patient was in agreement. I plan to see him back after the sleep study is completed and encouraged him to call with any interim questions, concerns, problems or updates.   Thank you very much for allowing me to participate in the care of this nice patient. If I can be of any further assistance to you please do not hesitate to call me at [phone].  Sincerely,   Star Age, MD, PhD

## 2018-03-03 NOTE — Patient Instructions (Signed)
Thank you for choosing [facility] for your sleep related care! It was nice to meet you today! I appreciate that you entrust me with your sleep related healthcare concerns. I hope, I was able to address at least some of your concerns today, and that I can help you feel reassured and also get better.    Here is what we discussed today and what we came up with as our plan for you:    Based on your symptoms and your exam I believe you are still at risk for obstructive sleep apnea and would benefit from reevaluation as it has been some years and you need new supplies and an updated machine. Therefore, I think we should proceed with a sleep study to determine how severe your sleep apnea is. If you have more than mild OSA, I want you to consider ongoing treatment with CPAP. Please remember, the risks and ramifications of moderate to severe obstructive sleep apnea or OSA are: Cardiovascular disease, including congestive heart failure, stroke, difficult to control hypertension, arrhythmias, and even type 2 diabetes has been linked to untreated OSA. Sleep apnea causes disruption of sleep and sleep deprivation in most cases, which, in turn, can cause recurrent headaches, problems with memory, mood, concentration, focus, and vigilance. Most people with untreated sleep apnea report excessive daytime sleepiness, which can affect their ability to drive. Please do not drive if you feel sleepy.   I will likely see you back after your sleep study to go over the test results and where to go from there. We will call you after your sleep study to advise about the results (most likely, you will hear from [place], my nurse) and to set up an appointment at the time, as necessary.    Our sleep lab administrative assistant will call you to schedule your sleep study. If you don't hear back from her by about 2 weeks from now, please feel free to call her at [phone]. You can leave a message with your phone  number and concerns, if you get the voicemail box. She will call back as soon as possible.

## 2018-03-15 ENCOUNTER — Ambulatory Visit (INDEPENDENT_AMBULATORY_CARE_PROVIDER_SITE_OTHER): Payer: Medicare Other | Admitting: Otolaryngology

## 2018-03-15 DIAGNOSIS — J343 Hypertrophy of nasal turbinates: Secondary | ICD-10-CM

## 2018-03-15 DIAGNOSIS — J320 Chronic maxillary sinusitis: Secondary | ICD-10-CM | POA: Diagnosis not present

## 2018-03-15 DIAGNOSIS — J338 Other polyp of sinus: Secondary | ICD-10-CM | POA: Diagnosis not present

## 2018-03-15 DIAGNOSIS — J342 Deviated nasal septum: Secondary | ICD-10-CM | POA: Diagnosis not present

## 2018-03-18 DIAGNOSIS — E782 Mixed hyperlipidemia: Secondary | ICD-10-CM | POA: Diagnosis not present

## 2018-03-18 DIAGNOSIS — Z1389 Encounter for screening for other disorder: Secondary | ICD-10-CM | POA: Diagnosis not present

## 2018-03-18 DIAGNOSIS — F3131 Bipolar disorder, current episode depressed, mild: Secondary | ICD-10-CM | POA: Diagnosis not present

## 2018-03-18 DIAGNOSIS — Z0000 Encounter for general adult medical examination without abnormal findings: Secondary | ICD-10-CM | POA: Diagnosis not present

## 2018-03-18 DIAGNOSIS — E119 Type 2 diabetes mellitus without complications: Secondary | ICD-10-CM | POA: Diagnosis not present

## 2018-03-18 DIAGNOSIS — I10 Essential (primary) hypertension: Secondary | ICD-10-CM | POA: Diagnosis not present

## 2018-03-23 ENCOUNTER — Ambulatory Visit (INDEPENDENT_AMBULATORY_CARE_PROVIDER_SITE_OTHER): Payer: Medicare Other | Admitting: Neurology

## 2018-03-23 DIAGNOSIS — D751 Secondary polycythemia: Secondary | ICD-10-CM

## 2018-03-23 DIAGNOSIS — Z820 Family history of epilepsy and other diseases of the nervous system: Secondary | ICD-10-CM

## 2018-03-23 DIAGNOSIS — G4720 Circadian rhythm sleep disorder, unspecified type: Secondary | ICD-10-CM

## 2018-03-23 DIAGNOSIS — R519 Headache, unspecified: Secondary | ICD-10-CM

## 2018-03-23 DIAGNOSIS — G4733 Obstructive sleep apnea (adult) (pediatric): Secondary | ICD-10-CM | POA: Diagnosis not present

## 2018-03-23 DIAGNOSIS — R51 Headache: Secondary | ICD-10-CM

## 2018-03-23 DIAGNOSIS — Z6841 Body Mass Index (BMI) 40.0 and over, adult: Secondary | ICD-10-CM

## 2018-03-23 DIAGNOSIS — R351 Nocturia: Secondary | ICD-10-CM

## 2018-03-25 ENCOUNTER — Telehealth: Payer: Self-pay

## 2018-03-25 NOTE — Telephone Encounter (Signed)
-----  Message from Star Age, MD sent at 03/25/2018  8:16 AM EDT ----- Patient referred by Dr. Federico Flake from heme, seen by me on 03/03/18, diagnostic PSG on 03/23/18.   Please call and notify the patient that the recent sleep study showed severe obstructive sleep apnea; he met split criteria, and was tried on diff masks, but could not proceed. I still recommend treatment for his severe OSA in the form of CPAP. This will require a repeat sleep study for desensitization, proper titration and mask fitting and correct monitoring of the oxygen saturations. Please explain to patient. I have placed an order in the chart. Thanks.  Star Age, MD, PhD [facility])

## 2018-03-25 NOTE — Addendum Note (Signed)
Addended by: Star Age on: 03/25/2018 08:16 AM   Modules accepted: Orders

## 2018-03-25 NOTE — Progress Notes (Signed)
Patient referred by Dr. Federico Flake from heme, seen by me on 03/03/18, diagnostic PSG on 03/23/18.   Please call and notify the patient that the recent sleep study showed severe obstructive sleep apnea; he met split criteria, and was tried on diff masks, but could not proceed. I still recommend treatment for his severe OSA in the form of CPAP. This will require a repeat sleep study for desensitization, proper titration and mask fitting and correct monitoring of the oxygen saturations. Please explain to patient. I have placed an order in the chart. Thanks.  Star Age, MD, PhD [facility])

## 2018-03-25 NOTE — Telephone Encounter (Signed)
I called pt. I advised pt that Dr. Rexene Alberts reviewed their sleep study results and found that pt has severe osa and recommends that pt be treated with a cpap. Dr. Rexene Alberts recommends that pt return for a repeat sleep study in order to properly titrate the cpap and ensure a good mask fit. Pt is agreeable to returning for a titration study. I advised pt that our sleep lab will file with pt's insurance and call pt to schedule the sleep study when we hear back from the pt's insurance regarding coverage of this sleep study. Pt verbalized understanding of results. Pt had no questions at this time but was encouraged to call back if questions arise.  Pt indicated that he is having a nasal  polypectomy on 05/17/2018. He has a polyp in his left nose that is "full" and require this surgery and believes this is why he could not tolerate the masks during the night of his sleep study. He is asking that is titration study be scheduled after 05/17/2018. I advised him that I will inform our sleep lab of this request Pt verbalized understanding.

## 2018-03-25 NOTE — Procedures (Signed)
PATIENT'S NAME:  Kyle Collins, Kyle Collins DOB:      Nov 27, 1985      MR#:    [id number]     DATE OF RECORDING: 03/23/2018 REFERRING M.D.:  Wanita Chamberlain, MD Study Performed:   Baseline Polysomnogram HISTORY: 32 year old man with a history of hypertension, polycythemia requiring periodic therapeutic phlebotomies, reflux disease, diabetes, mood disorder, thyroid disease, and morbid obesity, who was previously diagnosed with OSA, but could not tolerate CPAP at the time. The patient endorsed the Epworth Sleepiness Scale at 6/24 points. The patient's weight 346 pounds with a height of 71 (inches), resulting in a BMI of 48.5 kg/m2. The patient's neck circumference measured 20.5 inches.  CURRENT MEDICATIONS: Norvasc, Celexa, Jardiance, Lexapro, Flonase, Hydrodiuril, Prinivil, Glucophage, Zanaflex, Desyrel,   PROCEDURE:  This is a multichannel digital polysomnogram utilizing the Somnostar 11.2 system.  Electrodes and sensors were applied and monitored per AASM Specifications.   EEG, EOG, Chin and Limb EMG, were sampled at 200 Hz.  ECG, Snore and Nasal Pressure, Thermal Airflow, Respiratory Effort, CPAP Flow and Pressure, Oximetry was sampled at 50 Hz. Digital video and audio were recorded.      BASELINE STUDY  The patient met split study criteria and was briefly tried on CPAP, but requested to discontinue due to feeling of inability to breathe.  Lights Out was at 23:35 and Lights On at 06:03.  Total recording time (TRT) was 389 minutes, with a total sleep time (TST) of  272 minutes.   The patient's sleep latency to persistent sleep was 61 minutes, which is delayed. REM latency was 147.5 minutes, which is delayed. The sleep efficiency was 69.9%, which is reduced.     SLEEP ARCHITECTURE: WASO (Wake after sleep onset) was 104.5 minutes with moderate sleep fragmentation noted and one longer period of wakefulness. There were 8 minutes in Stage N1, 172.5 minutes Stage N2, 47.5 minutes Stage N3 and 44 minutes in Stage REM.   The percentage of Stage N1 was 2.9%, Stage N2 was 63.4%, which is increased, Stage N3 was 17.5%, which is normal, and Stage R (REM sleep) was 16.2%, which is reduced. The arousals were noted as: 55 were spontaneous, 0 were associated with PLMs, 62 were associated with respiratory events.   RESPIRATORY ANALYSIS:  There were a total of 177 respiratory events:  18 obstructive apneas, 15 central apneas and 0 mixed apneas with a total of 33 apneas and an apnea index (AI) of 7.3 /hour. There were 144 hypopneas with a hypopnea index of 31.8 /hour. The patient also had 0 respiratory event related arousals (RERAs).      The total APNEA/HYPOPNEA INDEX (AHI) was 39/hour and the total RESPIRATORY DISTURBANCE INDEX was  39. /hour.  49 events occurred in REM sleep and 213 events in NREM. The REM AHI was 66.8 /hour, versus a non-REM AHI of 33.7. The patient spent 177 minutes of total sleep time in the supine position and 95 minutes in non-supine.. The supine AHI was 44.1 versus a non-supine AHI of 29.7.  OXYGEN SATURATION & C02:  The Wake baseline 02 saturation was 96%, with the lowest being 80%. Time spent below 89% saturation equaled 48 minutes.   PERIODIC LIMB MOVEMENTS: The patient had a total of 0 Periodic Limb Movements. The Periodic Limb Movement (PLM) index was 0 and the PLM Arousal index was 0/hour.  Audio and video analysis did not show any abnormal or unusual movements, behaviors, phonations or vocalizations. The patient took no bathroom breaks. Moderate snoring was noted. The  EKG was in keeping with normal sinus rhythm (NSR).  Post-study, the patient indicated that sleep was worse than usual.   POLYSOMNOGRAPHY IMPRESSION :   1. Obstructive Sleep Apnea (OSA)  2. Dysfunctions associated with sleep stages or arousals from sleep  RECOMMENDATIONS:  1. This patient has severe obstructive sleep apnea but declined to proceed with CPAP therapy. He was tried on several different masks. Given his medical  history and the severity of his sleep disordered breathing, treatment with positive airway pressure is still advised. I will, therefore, recommend that the patient return for a full night CPAP titration study to help with desensitization and optimization of management. Please note that untreated obstructive sleep apnea may carry additional perioperative morbidity. Patients with significant obstructive sleep apnea should receive perioperative PAP therapy and the surgeons and particularly the anesthesiologist should be informed of the diagnosis and the severity of the sleep disordered breathing. 2. This study shows sleep fragmentation and abnormal sleep stage percentages; these are nonspecific findings and per se do not signify an intrinsic sleep disorder or a cause for the patient's sleep-related symptoms. Causes include (but are not limited to) the first night effect of the sleep study, circadian rhythm disturbances, medication effect or an underlying mood disorder or medical problem.  3. The patient should be cautioned not to drive, work at heights, or operate dangerous or heavy equipment when tired or sleepy. Review and reiteration of good sleep hygiene measures should be pursued with any patient. 4. The patient will be seen in follow-up in the sleep clinic at [facility] for discussion of the test results, symptom and treatment compliance review, further management strategies, etc. The referring provider will be notified of the test results.  I certify that I have reviewed the entire raw data recording prior to the issuance of this report in accordance with the Standards of Accreditation of the American Academy of Sleep Medicine (AASM)   Star Age, MD, PhD Diplomat, American Board of Neurology and Sleep Medicine (Neurology and Sleep Medicine)

## 2018-03-30 ENCOUNTER — Telehealth: Payer: Self-pay

## 2018-03-30 NOTE — Telephone Encounter (Signed)
Spoke with patient about scheduling CPAP study. Pt is having nasal surgery on Oct 21. He doesn't know how Hart[place] his recovery time will be at this time. Pt stated he will call back after surgery to schedule CPAP appt.

## 2018-04-01 DIAGNOSIS — R7989 Other specified abnormal findings of blood chemistry: Secondary | ICD-10-CM | POA: Diagnosis not present

## 2018-04-01 DIAGNOSIS — D751 Secondary polycythemia: Secondary | ICD-10-CM | POA: Diagnosis not present

## 2018-04-05 DIAGNOSIS — D751 Secondary polycythemia: Secondary | ICD-10-CM | POA: Diagnosis not present

## 2018-04-21 ENCOUNTER — Other Ambulatory Visit: Payer: Self-pay | Admitting: Otolaryngology

## 2018-05-07 NOTE — Progress Notes (Signed)
Consulted with Dr. Therisa Doyne about this pt and decision was made for him to have his surgery done at the main OR and stay overnight.  Dr. Velvet Bathe office  Was called and spoke to [facility].

## 2018-05-11 DIAGNOSIS — K8020 Calculus of gallbladder without cholecystitis without obstruction: Secondary | ICD-10-CM | POA: Diagnosis not present

## 2018-05-11 DIAGNOSIS — E039 Hypothyroidism, unspecified: Secondary | ICD-10-CM | POA: Diagnosis not present

## 2018-05-11 DIAGNOSIS — F319 Bipolar disorder, unspecified: Secondary | ICD-10-CM | POA: Diagnosis not present

## 2018-05-11 DIAGNOSIS — N471 Phimosis: Secondary | ICD-10-CM | POA: Diagnosis not present

## 2018-05-11 DIAGNOSIS — K760 Fatty (change of) liver, not elsewhere classified: Secondary | ICD-10-CM | POA: Diagnosis not present

## 2018-05-11 DIAGNOSIS — Z7951 Long term (current) use of inhaled steroids: Secondary | ICD-10-CM | POA: Diagnosis not present

## 2018-05-11 DIAGNOSIS — Z7984 Long term (current) use of oral hypoglycemic drugs: Secondary | ICD-10-CM | POA: Diagnosis not present

## 2018-05-11 DIAGNOSIS — F909 Attention-deficit hyperactivity disorder, unspecified type: Secondary | ICD-10-CM | POA: Diagnosis not present

## 2018-05-11 DIAGNOSIS — E119 Type 2 diabetes mellitus without complications: Secondary | ICD-10-CM | POA: Diagnosis not present

## 2018-05-11 DIAGNOSIS — G4733 Obstructive sleep apnea (adult) (pediatric): Secondary | ICD-10-CM | POA: Diagnosis not present

## 2018-05-11 DIAGNOSIS — I10 Essential (primary) hypertension: Secondary | ICD-10-CM | POA: Diagnosis not present

## 2018-05-11 DIAGNOSIS — Z6841 Body Mass Index (BMI) 40.0 and over, adult: Secondary | ICD-10-CM | POA: Diagnosis not present

## 2018-05-11 DIAGNOSIS — D751 Secondary polycythemia: Secondary | ICD-10-CM | POA: Diagnosis not present

## 2018-05-11 DIAGNOSIS — K219 Gastro-esophageal reflux disease without esophagitis: Secondary | ICD-10-CM | POA: Diagnosis not present

## 2018-05-11 DIAGNOSIS — Z9119 Patient's noncompliance with other medical treatment and regimen: Secondary | ICD-10-CM | POA: Diagnosis not present

## 2018-05-17 DIAGNOSIS — D751 Secondary polycythemia: Secondary | ICD-10-CM | POA: Diagnosis not present

## 2018-05-26 ENCOUNTER — Ambulatory Visit (INDEPENDENT_AMBULATORY_CARE_PROVIDER_SITE_OTHER): Payer: Medicare Other | Admitting: Urology

## 2018-05-26 DIAGNOSIS — N471 Phimosis: Secondary | ICD-10-CM

## 2018-08-10 NOTE — Pre-Procedure Instructions (Signed)
Kyle Collins  08/10/2018      [facility] - [place], [name] [address] Phone: [phone] Fax: [phone]    Your procedure is scheduled on Wednesday January 22nd.  Report to [facility] Admitting at 6:30 A.M.  Call this number if you have problems the morning of surgery:  [phone]   Remember:  Do not eat or drink after midnight.    Take these medicines the morning of surgery with A SIP OF WATER  amLODipine (NORVASC)  citalopram (CELEXA)    7 days prior to surgery STOP taking any Aspirin(unless otherwise instructed by your surgeon), Aleve, Naproxen, Ibuprofen, Motrin, Advil, Goody's, BC's, all herbal medications, fish oil, and all vitamins   HOW TO MANAGE YOUR DIABETES BEFORE AND AFTER SURGERY  Why is it important to control my blood sugar before and after surgery? . Improving blood sugar levels before and after surgery helps healing and can limit problems. . A way of improving blood sugar control is eating a healthy diet by: o  Eating less sugar and carbohydrates o  Increasing activity/exercise o  Talking with your doctor about reaching your blood sugar goals . High blood sugars (greater than 180 mg/dL) can raise your risk of infections and slow your recovery, so you will need to focus on controlling your diabetes during the weeks before surgery. . Make sure that the doctor who takes care of your diabetes knows about your planned surgery including the date and location.  How do I manage my blood sugar before surgery? . Check your blood sugar at least 4 times a day, starting 2 days before surgery, to make sure that the level is not too high or low. o Check your blood sugar the morning of your surgery when you wake up and every 2 hours until you get to the Short Stay unit. . If your blood sugar is less than 70 mg/dL, you will need to treat for low blood sugar: o Do not take insulin. o Treat a low blood sugar  (less than 70 mg/dL) with  cup of clear juice (cranberry or apple), 4 glucose tablets, OR glucose gel. Recheck blood sugar in 15 minutes after treatment (to make sure it is greater than 70 mg/dL). If your blood sugar is not greater than 70 mg/dL on recheck, call [phone] o  for further instructions. . Report your blood sugar to the short stay nurse when you get to Short Stay.  . If you are admitted to the hospital after surgery: o Your blood sugar will be checked by the staff and you will probably be given insulin after surgery (instead of oral diabetes medicines) to make sure you have good blood sugar levels. o The goal for blood sugar control after surgery is 80-180 mg/dL.     WHAT DO I DO ABOUT MY DIABETES MEDICATION?   Marland Kitchen Do not take oral diabetes medicines (pills): empagliflozin (JARDIANCE) or metFORMIN (GLUCOPHAGE-XR)  the morning of surgery.    Do not wear jewelry.  Do not wear lotions, powders, or colognes, or deodorant.  Do not shave 48 hours prior to surgery.  Men may shave face and neck.  Do not bring valuables to the hospital.  [facility] is not responsible for any belongings or valuables.  Contacts, dentures or bridgework may not be worn into surgery.  Leave your suitcase in the car.  After surgery it may be brought to your room.  For patients  admitted to the hospital, discharge time will be determined by your treatment team.  Patients discharged the day of surgery will not be allowed to drive home.   [place]- Preparing For Surgery  Before surgery, you can play an important role. Because skin is not sterile, your skin needs to be as free of germs as possible. You can reduce the number of germs on your skin by washing with CHG (chlorahexidine gluconate) Soap before surgery.  CHG is an antiseptic cleaner which kills germs and bonds with the skin to continue killing germs even after washing.    Oral Hygiene is also important to reduce your risk of infection.   Remember - BRUSH YOUR TEETH THE MORNING OF SURGERY WITH YOUR REGULAR TOOTHPASTE  Please do not use if you have an allergy to CHG or antibacterial soaps. If your skin becomes reddened/irritated stop using the CHG.  Do not shave (including legs and underarms) for at least 48 hours prior to first CHG shower. It is OK to shave your face.  Please follow these instructions carefully.   1. Shower the NIGHT BEFORE SURGERY and the MORNING OF SURGERY with CHG.   2. If you chose to wash your hair, wash your hair first as usual with your normal shampoo.  3. After you shampoo, rinse your hair and body thoroughly to remove the shampoo.  4. Use CHG as you would any other liquid soap. You can apply CHG directly to the skin and wash gently with a scrungie or a clean washcloth.   5. Apply the CHG Soap to your body ONLY FROM THE NECK DOWN.  Do not use on open wounds or open sores. Avoid contact with your eyes, ears, mouth and genitals (private parts). Wash Face and genitals (private parts)  with your normal soap.  6. Wash thoroughly, paying special attention to the area where your surgery will be performed.  7. Thoroughly rinse your body with warm water from the neck down.  8. DO NOT shower/wash with your normal soap after using and rinsing off the CHG Soap.  9. Pat yourself dry with a CLEAN TOWEL.  10. Wear CLEAN PAJAMAS to bed the night before surgery, wear comfortable clothes the morning of surgery  11. Place CLEAN SHEETS on your bed the night of your first shower and DO NOT SLEEP WITH PETS.    Day of Surgery: Shower as stated above. Do not apply any deodorants/lotions.  Please wear clean clothes to the hospital/surgery center.   Remember to brush your teeth WITH YOUR REGULAR TOOTHPASTE.  Please read over the following fact sheets that you were given.

## 2018-08-11 ENCOUNTER — Encounter (HOSPITAL_COMMUNITY)
Admission: RE | Admit: 2018-08-11 | Discharge: 2018-08-11 | Disposition: A | Discharge status: Home / Self Care | Payer: Medicare Other | Source: Ambulatory Visit | Attending: Otolaryngology | Admitting: Otolaryngology

## 2018-08-11 ENCOUNTER — Encounter (HOSPITAL_COMMUNITY): Payer: Self-pay

## 2018-08-11 ENCOUNTER — Other Ambulatory Visit: Payer: Self-pay

## 2018-08-11 DIAGNOSIS — Z01818 Encounter for other preprocedural examination: Secondary | ICD-10-CM | POA: Insufficient documentation

## 2018-08-11 HISTORY — DX: Hemochromatosis, unspecified: E83.119

## 2018-08-11 LAB — CBC
HCT: 50.8 % (ref 39.0–52.0)
HEMOGLOBIN: 16.8 g/dL (ref 13.0–17.0)
MCH: 29.3 pg (ref 26.0–34.0)
MCHC: 33.1 g/dL (ref 30.0–36.0)
MCV: 88.7 fL (ref 80.0–100.0)
Platelets: 262 10*3/uL (ref 150–400)
RBC: 5.73 MIL/uL (ref 4.22–5.81)
RDW: 12.3 % (ref 11.5–15.5)
WBC: 9 10*3/uL (ref 4.0–10.5)
nRBC: 0 % (ref 0.0–0.2)

## 2018-08-11 LAB — BASIC METABOLIC PANEL
Anion gap: 9 (ref 5–15)
BUN: 19 mg/dL (ref 6–20)
CO2: 27 mmol/L (ref 22–32)
CREATININE: 0.86 mg/dL (ref 0.61–1.24)
Calcium: 9.6 mg/dL (ref 8.9–10.3)
Chloride: 101 mmol/L (ref 98–111)
GFR calc Af Amer: >60 mL/min (ref >60)
GLUCOSE: 285 mg/dL — AB (ref 70–99)
POTASSIUM: 4.6 mmol/L (ref 3.5–5.1)
SODIUM: 137 mmol/L (ref 135–145)

## 2018-08-11 LAB — GLUCOSE, CAPILLARY: GLUCOSE-CAPILLARY: 281 mg/dL — AB (ref 70–99)

## 2018-08-11 LAB — HEMOGLOBIN A1C
HEMOGLOBIN A1C: 10.3 % — AB (ref 4.8–5.6)
MEAN PLASMA GLUCOSE: 248.91 mg/dL

## 2018-08-11 NOTE — Progress Notes (Addendum)
PCP - Dr. Stoney Bang Cardiologist - denies  Chest x-ray - N/A EKG - 08/11/18 Stress Test - denies ECHO - denies Cardiac Cath - denies  Sleep Study - 2015 CPAP - patient has CPAP but not currently using due to sinus issues  Fasting Blood Sugar - 120's Patient does not regularly check CBG. Patient states he has been out of his medications recently but has since received new prescriptions. Did not take any medications today. CBG today 250.  Aspirin Instructions:Patient instructed to hold all Aspirin, NSAID's, herbal medications, fish oil and vitamins 7 days prior to surgery.   Anesthesia review: a1C 10.3  Patient denies shortness of breath, fever, cough and chest pain at PAT appointment   Patient verbalized understanding of instructions that were given to them at the PAT appointment. Patient was also instructed that they will need to review over the PAT instructions again at home before surgery.

## 2018-08-12 NOTE — Anesthesia Preprocedure Evaluation (Addendum)
Anesthesia Evaluation  Patient identified by MRN, date of birth, ID band Patient awake    Reviewed: Allergy & Precautions, NPO status , Patient's Chart, lab work & pertinent test results  History of Anesthesia Complications Negative for: history of anesthetic complications  Airway Mallampati: I  TM Distance: >3 FB Neck ROM: Full    Dental no notable dental hx. (+) Teeth Intact, Dental Advisory Given   Pulmonary sleep apnea , former smoker,    Pulmonary exam normal        Cardiovascular hypertension, Normal cardiovascular exam     Neuro/Psych PSYCHIATRIC DISORDERS Anxiety Depression Bipolar Disorder negative neurological ROS     GI/Hepatic Neg liver ROS, GERD  ,  Endo/Other  diabetes, Poorly ControlledMorbid obesity  Renal/GU negative Renal ROS  negative genitourinary   Musculoskeletal negative musculoskeletal ROS (+)   Abdominal (+) + obese,   Peds  Hematology negative hematology ROS (+)   Anesthesia Other Findings 33 yo M for septoplasty/turbinate reduction - PMH: OSA, bipolar d/o, HTN, GERD, poorly controlled DM, BMI 47  Reproductive/Obstetrics                           Anesthesia Physical Anesthesia Plan  ASA: III  Anesthesia Plan: General   Post-op Pain Management:    Induction: Intravenous  PONV Risk Score and Plan: 2 and Ondansetron, Dexamethasone, Midazolam and Treatment may vary due to age or medical condition  Airway Management Planned: Oral ETT  Additional Equipment: None  Intra-op Plan:   Post-operative Plan: Extubation in OR  Informed Consent: I have reviewed the patients History and Physical, chart, labs and discussed the procedure including the risks, benefits and alternatives for the proposed anesthesia with the patient or authorized representative who has indicated his/her understanding and acceptance.     Dental advisory given  Plan Discussed with:    Anesthesia Plan Comments: (History of poorly controlled DMII. A1c 10.3 on 08/11/17. Dr. Benjamine Mola aware, understands case could be cancelled if pt significantly hyperglycemic on DOS. )     Anesthesia Quick Evaluation

## 2018-08-18 ENCOUNTER — Ambulatory Visit (HOSPITAL_COMMUNITY): Payer: Medicare Other | Admitting: Registered Nurse

## 2018-08-18 ENCOUNTER — Encounter (HOSPITAL_COMMUNITY)
Admission: RE | Disposition: A | Discharge status: Home / Self Care | Payer: Self-pay | Source: Ambulatory Visit | Attending: Otolaryngology

## 2018-08-18 ENCOUNTER — Other Ambulatory Visit: Payer: Self-pay

## 2018-08-18 ENCOUNTER — Encounter (HOSPITAL_COMMUNITY): Payer: Self-pay | Admitting: *Deleted

## 2018-08-18 ENCOUNTER — Ambulatory Visit (HOSPITAL_COMMUNITY): Payer: Medicare Other | Admitting: Physician Assistant

## 2018-08-18 ENCOUNTER — Ambulatory Visit (HOSPITAL_COMMUNITY)
Admission: RE | Admit: 2018-08-18 | Discharge: 2018-08-19 | Disposition: A | Discharge status: Home / Self Care | Payer: Medicare Other | Source: Ambulatory Visit | Attending: Otolaryngology | Admitting: Otolaryngology

## 2018-08-18 DIAGNOSIS — F329 Major depressive disorder, single episode, unspecified: Secondary | ICD-10-CM | POA: Diagnosis not present

## 2018-08-18 DIAGNOSIS — Z7984 Long term (current) use of oral hypoglycemic drugs: Secondary | ICD-10-CM | POA: Diagnosis not present

## 2018-08-18 DIAGNOSIS — J320 Chronic maxillary sinusitis: Secondary | ICD-10-CM | POA: Insufficient documentation

## 2018-08-18 DIAGNOSIS — J342 Deviated nasal septum: Secondary | ICD-10-CM | POA: Insufficient documentation

## 2018-08-18 DIAGNOSIS — J3489 Other specified disorders of nose and nasal sinuses: Secondary | ICD-10-CM | POA: Insufficient documentation

## 2018-08-18 DIAGNOSIS — J343 Hypertrophy of nasal turbinates: Secondary | ICD-10-CM | POA: Insufficient documentation

## 2018-08-18 DIAGNOSIS — I10 Essential (primary) hypertension: Secondary | ICD-10-CM | POA: Diagnosis not present

## 2018-08-18 DIAGNOSIS — J330 Polyp of nasal cavity: Secondary | ICD-10-CM | POA: Insufficient documentation

## 2018-08-18 DIAGNOSIS — Z87891 Personal history of nicotine dependence: Secondary | ICD-10-CM | POA: Diagnosis not present

## 2018-08-18 DIAGNOSIS — Z79899 Other long term (current) drug therapy: Secondary | ICD-10-CM | POA: Insufficient documentation

## 2018-08-18 DIAGNOSIS — Z6841 Body Mass Index (BMI) 40.0 and over, adult: Secondary | ICD-10-CM | POA: Diagnosis not present

## 2018-08-18 DIAGNOSIS — J338 Other polyp of sinus: Secondary | ICD-10-CM | POA: Diagnosis not present

## 2018-08-18 DIAGNOSIS — K219 Gastro-esophageal reflux disease without esophagitis: Secondary | ICD-10-CM | POA: Diagnosis not present

## 2018-08-18 DIAGNOSIS — G4730 Sleep apnea, unspecified: Secondary | ICD-10-CM | POA: Insufficient documentation

## 2018-08-18 DIAGNOSIS — J339 Nasal polyp, unspecified: Secondary | ICD-10-CM | POA: Diagnosis not present

## 2018-08-18 DIAGNOSIS — Z9889 Other specified postprocedural states: Secondary | ICD-10-CM

## 2018-08-18 DIAGNOSIS — E1165 Type 2 diabetes mellitus with hyperglycemia: Secondary | ICD-10-CM | POA: Insufficient documentation

## 2018-08-18 HISTORY — PX: NASAL SEPTUM SURGERY: SHX37

## 2018-08-18 HISTORY — PX: NASAL SEPTOPLASTY W/ TURBINOPLASTY: SHX2070

## 2018-08-18 HISTORY — DX: Attention-deficit hyperactivity disorder, unspecified type: F90.9

## 2018-08-18 HISTORY — PX: MAXILLARY ANTROSTOMY: SHX2003

## 2018-08-18 LAB — GLUCOSE, CAPILLARY
GLUCOSE-CAPILLARY: 372 mg/dL — AB (ref 70–99)
Glucose-Capillary: 273 mg/dL — ABNORMAL HIGH (ref 70–99)
Glucose-Capillary: 354 mg/dL — ABNORMAL HIGH (ref 70–99)
Glucose-Capillary: 396 mg/dL — ABNORMAL HIGH (ref 70–99)
Glucose-Capillary: 338 mg/dL — ABNORMAL HIGH (ref 70–99)
Glucose-Capillary: 280 mg/dL — ABNORMAL HIGH (ref 70–99)

## 2018-08-18 LAB — SURGICAL PCR SCREEN
MRSA, PCR: POSITIVE — AB
Staphylococcus aureus: POSITIVE — AB

## 2018-08-18 SURGERY — SEPTOPLASTY, NOSE, WITH NASAL TURBINATE REDUCTION
Anesthesia: General | Site: Nose | Laterality: Right

## 2018-08-18 MED ORDER — DEXTROSE 5 % IV SOLN
INTRAVENOUS | Status: DC | PRN
  Administered 2018-08-18: 3 g via INTRAVENOUS

## 2018-08-18 MED ORDER — CANAGLIFLOZIN 100 MG PO TABS
100.0000 mg | ORAL_TABLET | Freq: Every day | ORAL | Status: DC
  Filled 2018-08-18: qty 1

## 2018-08-18 MED ORDER — ONDANSETRON HCL 4 MG/2ML IJ SOLN
INTRAMUSCULAR | Status: DC | PRN
  Administered 2018-08-18: 4 mg via INTRAVENOUS

## 2018-08-18 MED ORDER — PROPOFOL 10 MG/ML IV BOLUS
INTRAVENOUS | Status: AC
  Filled 2018-08-18: qty 20

## 2018-08-18 MED ORDER — OXYMETAZOLINE HCL 0.05 % NA SOLN
NASAL | Status: AC
  Filled 2018-08-18: qty 30

## 2018-08-18 MED ORDER — STERILE WATER FOR IRRIGATION IR SOLN
Status: DC | PRN
  Administered 2018-08-18: 1000 mL

## 2018-08-18 MED ORDER — OXYCODONE-ACETAMINOPHEN 5-325 MG PO TABS
1.0000 | ORAL_TABLET | ORAL | Status: DC | PRN
  Administered 2018-08-18 – 2018-08-19 (×3): 2 via ORAL
  Filled 2018-08-18 (×3): qty 2

## 2018-08-18 MED ORDER — ACETAMINOPHEN 160 MG/5ML PO SOLN
650.0000 mg | ORAL | Status: DC | PRN
  Administered 2018-08-18: 650 mg via ORAL
  Filled 2018-08-18: qty 20.3

## 2018-08-18 MED ORDER — DEXMEDETOMIDINE HCL IN NACL 200 MCG/50ML IV SOLN
INTRAVENOUS | Status: AC
  Filled 2018-08-18: qty 50

## 2018-08-18 MED ORDER — METFORMIN HCL ER 500 MG PO TB24
1000.0000 mg | ORAL_TABLET | Freq: Two times a day (BID) | ORAL | Status: DC
  Administered 2018-08-18 – 2018-08-19 (×2): 1000 mg via ORAL
  Filled 2018-08-18 (×2): qty 2

## 2018-08-18 MED ORDER — ONDANSETRON HCL 4 MG/2ML IJ SOLN
INTRAMUSCULAR | Status: AC
  Filled 2018-08-18: qty 2

## 2018-08-18 MED ORDER — FENTANYL CITRATE (PF) 250 MCG/5ML IJ SOLN
INTRAMUSCULAR | Status: AC
  Filled 2018-08-18: qty 5

## 2018-08-18 MED ORDER — AMLODIPINE BESYLATE 2.5 MG PO TABS
2.5000 mg | ORAL_TABLET | Freq: Every day | ORAL | Status: DC
  Administered 2018-08-19: 2.5 mg via ORAL
  Filled 2018-08-18: qty 1

## 2018-08-18 MED ORDER — HYDROCHLOROTHIAZIDE 25 MG PO TABS
25.0000 mg | ORAL_TABLET | Freq: Every day | ORAL | Status: DC
  Administered 2018-08-19: 25 mg via ORAL
  Filled 2018-08-18: qty 1

## 2018-08-18 MED ORDER — LACTATED RINGERS IV SOLN
INTRAVENOUS | Status: DC | PRN
  Administered 2018-08-18 (×2): via INTRAVENOUS

## 2018-08-18 MED ORDER — ONDANSETRON HCL 4 MG/2ML IJ SOLN: 4.0000 mg | Freq: Once | INTRAMUSCULAR | Status: DC | PRN

## 2018-08-18 MED ORDER — FENTANYL CITRATE (PF) 100 MCG/2ML IJ SOLN
25.0000 ug | INTRAMUSCULAR | Status: DC | PRN
  Administered 2018-08-18 (×2): 50 ug via INTRAVENOUS

## 2018-08-18 MED ORDER — LIDOCAINE 2% (20 MG/ML) 5 ML SYRINGE
INTRAMUSCULAR | Status: AC
  Filled 2018-08-18: qty 5

## 2018-08-18 MED ORDER — FENTANYL CITRATE (PF) 100 MCG/2ML IJ SOLN
INTRAMUSCULAR | Status: AC
  Administered 2018-08-18: 50 ug via INTRAVENOUS
  Filled 2018-08-18: qty 2

## 2018-08-18 MED ORDER — 0.9 % SODIUM CHLORIDE (POUR BTL) OPTIME
TOPICAL | Status: DC | PRN
  Administered 2018-08-18: 1000 mL

## 2018-08-18 MED ORDER — LISINOPRIL 40 MG PO TABS
40.0000 mg | ORAL_TABLET | Freq: Every day | ORAL | Status: DC
  Administered 2018-08-19: 40 mg via ORAL
  Filled 2018-08-18: qty 1

## 2018-08-18 MED ORDER — FENTANYL CITRATE (PF) 100 MCG/2ML IJ SOLN
INTRAMUSCULAR | Status: DC | PRN
  Administered 2018-08-18 (×2): 50 ug via INTRAVENOUS
  Administered 2018-08-18: 100 ug via INTRAVENOUS
  Administered 2018-08-18: 50 ug via INTRAVENOUS

## 2018-08-18 MED ORDER — LIDOCAINE-EPINEPHRINE 1 %-1:100000 IJ SOLN
INTRAMUSCULAR | Status: DC | PRN
  Administered 2018-08-18: 3 mL

## 2018-08-18 MED ORDER — LIDOCAINE 2% (20 MG/ML) 5 ML SYRINGE
INTRAMUSCULAR | Status: DC | PRN
  Administered 2018-08-18: 100 mg via INTRAVENOUS

## 2018-08-18 MED ORDER — DEXAMETHASONE SODIUM PHOSPHATE 10 MG/ML IJ SOLN
INTRAMUSCULAR | Status: DC | PRN
  Administered 2018-08-18: 10 mg via INTRAVENOUS

## 2018-08-18 MED ORDER — AMOXICILLIN 875 MG PO TABS: 875.0000 mg | ORAL_TABLET | Freq: Two times a day (BID) | ORAL | 0 refills | Status: AC

## 2018-08-18 MED ORDER — MORPHINE SULFATE (PF) 2 MG/ML IV SOLN: 2.0000 mg | INTRAVENOUS | Status: DC | PRN

## 2018-08-18 MED ORDER — MIDAZOLAM HCL 2 MG/2ML IJ SOLN
INTRAMUSCULAR | Status: AC
  Filled 2018-08-18: qty 2

## 2018-08-18 MED ORDER — SUGAMMADEX SODIUM 200 MG/2ML IV SOLN
INTRAVENOUS | Status: DC | PRN
  Administered 2018-08-18 (×2): 100 mg via INTRAVENOUS

## 2018-08-18 MED ORDER — CHLORHEXIDINE GLUCONATE CLOTH 2 % EX PADS: 6.0000 | MEDICATED_PAD | Freq: Every day | CUTANEOUS | Status: DC

## 2018-08-18 MED ORDER — ACETAMINOPHEN 10 MG/ML IV SOLN
1000.0000 mg | Freq: Once | INTRAVENOUS | Status: AC
  Administered 2018-08-18: 1000 mg via INTRAVENOUS

## 2018-08-18 MED ORDER — OXYCODONE HCL 5 MG PO TABS
ORAL_TABLET | ORAL | Status: AC
  Administered 2018-08-18: 5 mg via ORAL
  Filled 2018-08-18: qty 1

## 2018-08-18 MED ORDER — MUPIROCIN 2 % EX OINT
1.0000 "application " | TOPICAL_OINTMENT | Freq: Two times a day (BID) | CUTANEOUS | Status: DC
  Administered 2018-08-18 – 2018-08-19 (×2): 1 via NASAL
  Filled 2018-08-18 (×2): qty 22

## 2018-08-18 MED ORDER — ONDANSETRON HCL 4 MG PO TABS: 4.0000 mg | ORAL_TABLET | ORAL | Status: DC | PRN

## 2018-08-18 MED ORDER — CITALOPRAM HYDROBROMIDE 20 MG PO TABS
20.0000 mg | ORAL_TABLET | Freq: Every day | ORAL | Status: DC
  Administered 2018-08-19: 20 mg via ORAL
  Filled 2018-08-18: qty 1

## 2018-08-18 MED ORDER — PROPOFOL 10 MG/ML IV BOLUS
INTRAVENOUS | Status: DC | PRN
  Administered 2018-08-18: 300 mg via INTRAVENOUS

## 2018-08-18 MED ORDER — KCL IN DEXTROSE-NACL 20-5-0.45 MEQ/L-%-% IV SOLN: INTRAVENOUS | Status: DC

## 2018-08-18 MED ORDER — AMLODIPINE BESYLATE 10 MG PO TABS
10.0000 mg | ORAL_TABLET | Freq: Every day | ORAL | Status: DC
  Administered 2018-08-19: 10 mg via ORAL
  Filled 2018-08-18: qty 1

## 2018-08-18 MED ORDER — MIDAZOLAM HCL 5 MG/5ML IJ SOLN
INTRAMUSCULAR | Status: DC | PRN
  Administered 2018-08-18: 2 mg via INTRAVENOUS

## 2018-08-18 MED ORDER — INSULIN ASPART 100 UNIT/ML ~~LOC~~ SOLN
10.0000 [IU] | Freq: Once | SUBCUTANEOUS | Status: AC
  Administered 2018-08-18: 10 [IU] via SUBCUTANEOUS

## 2018-08-18 MED ORDER — ROCURONIUM BROMIDE 50 MG/5ML IV SOSY
PREFILLED_SYRINGE | INTRAVENOUS | Status: AC
  Filled 2018-08-18: qty 5

## 2018-08-18 MED ORDER — BACITRACIN ZINC 500 UNIT/GM EX OINT
TOPICAL_OINTMENT | CUTANEOUS | Status: AC
  Filled 2018-08-18: qty 28.35

## 2018-08-18 MED ORDER — INSULIN GLARGINE 100 UNIT/ML ~~LOC~~ SOLN
20.0000 [IU] | Freq: Every day | SUBCUTANEOUS | Status: DC
  Administered 2018-08-18 – 2018-08-19 (×2): 20 [IU] via SUBCUTANEOUS
  Filled 2018-08-18 (×2): qty 0.2

## 2018-08-18 MED ORDER — DEXMEDETOMIDINE HCL 200 MCG/2ML IV SOLN
INTRAVENOUS | Status: DC | PRN
  Administered 2018-08-18 (×2): 8 ug via INTRAVENOUS

## 2018-08-18 MED ORDER — ROCURONIUM BROMIDE 50 MG/5ML IV SOSY
PREFILLED_SYRINGE | INTRAVENOUS | Status: DC | PRN
  Administered 2018-08-18: 10 mg via INTRAVENOUS
  Administered 2018-08-18: 20 mg via INTRAVENOUS

## 2018-08-18 MED ORDER — ACETAMINOPHEN 10 MG/ML IV SOLN
INTRAVENOUS | Status: AC
  Administered 2018-08-18: 1000 mg via INTRAVENOUS
  Filled 2018-08-18: qty 100

## 2018-08-18 MED ORDER — ONDANSETRON HCL 4 MG/2ML IJ SOLN
4.0000 mg | INTRAMUSCULAR | Status: DC | PRN
  Administered 2018-08-19: 4 mg via INTRAVENOUS
  Filled 2018-08-18: qty 2

## 2018-08-18 MED ORDER — INSULIN ASPART 100 UNIT/ML ~~LOC~~ SOLN
SUBCUTANEOUS | Status: AC
  Administered 2018-08-18: 10 [IU] via SUBCUTANEOUS
  Filled 2018-08-18: qty 1

## 2018-08-18 MED ORDER — INSULIN ASPART 100 UNIT/ML ~~LOC~~ SOLN
0.0000 [IU] | Freq: Three times a day (TID) | SUBCUTANEOUS | Status: DC
  Administered 2018-08-18: 15 [IU] via SUBCUTANEOUS
  Administered 2018-08-18: 20 [IU] via SUBCUTANEOUS
  Administered 2018-08-19: 11 [IU] via SUBCUTANEOUS

## 2018-08-18 MED ORDER — OXYCODONE HCL 5 MG PO TABS
5.0000 mg | ORAL_TABLET | Freq: Once | ORAL | Status: AC | PRN
  Administered 2018-08-18: 5 mg via ORAL

## 2018-08-18 MED ORDER — OXYCODONE-ACETAMINOPHEN 5-325 MG PO TABS: 1.0000 | ORAL_TABLET | ORAL | 0 refills | Status: DC | PRN

## 2018-08-18 MED ORDER — DEXAMETHASONE SODIUM PHOSPHATE 10 MG/ML IJ SOLN
INTRAMUSCULAR | Status: AC
  Filled 2018-08-18: qty 1

## 2018-08-18 MED ORDER — INFLUENZA VAC SPLIT QUAD 0.5 ML IM SUSY
0.5000 mL | PREFILLED_SYRINGE | INTRAMUSCULAR | Status: DC
  Filled 2018-08-18: qty 0.5

## 2018-08-18 MED ORDER — OXYCODONE HCL 5 MG/5ML PO SOLN: 5.0000 mg | Freq: Once | ORAL | Status: AC | PRN

## 2018-08-18 MED ORDER — OXYMETAZOLINE HCL 0.05 % NA SOLN
NASAL | Status: DC | PRN
  Administered 2018-08-18: 1

## 2018-08-18 MED ORDER — BACITRACIN ZINC 500 UNIT/GM EX OINT
TOPICAL_OINTMENT | CUTANEOUS | Status: DC | PRN
  Administered 2018-08-18: 1 via TOPICAL

## 2018-08-18 MED ORDER — TRAZODONE HCL 150 MG PO TABS: 75.0000 mg | ORAL_TABLET | Freq: Every evening | ORAL | Status: DC | PRN

## 2018-08-18 MED ORDER — ACETAMINOPHEN 650 MG RE SUPP: 650.0000 mg | RECTAL | Status: DC | PRN

## 2018-08-18 SURGICAL SUPPLY — 36 items
BLADE TRICUT 4MM (BLADE) ×2 IMPLANT
CANISTER SUCT 3000ML PPV (MISCELLANEOUS) ×6 IMPLANT
COAGULATOR SUCT 6 FR SWTCH (ELECTROSURGICAL) ×1
COAGULATOR SUCT SWTCH 10FR 6 (ELECTROSURGICAL) ×3 IMPLANT
COVER SURGICAL LIGHT HANDLE (MISCELLANEOUS) ×2 IMPLANT
DRSG NASOPORE 8CM (GAUZE/BANDAGES/DRESSINGS) ×2 IMPLANT
ELECT COATED BLADE 2.86 ST (ELECTRODE) ×2 IMPLANT
ELECT REM PT RETURN 9FT ADLT (ELECTROSURGICAL) ×4
ELECTRODE REM PT RTRN 9FT ADLT (ELECTROSURGICAL) ×2 IMPLANT
FILTER ARTHROSCOPY CONVERTOR (FILTER) ×4 IMPLANT
GAUZE 4X4 16PLY RFD (DISPOSABLE) ×4 IMPLANT
GLOVE ECLIPSE 7.5 STRL STRAW (GLOVE) ×4 IMPLANT
GOWN STRL REUS W/ TWL LRG LVL3 (GOWN DISPOSABLE) ×4 IMPLANT
GOWN STRL REUS W/TWL LRG LVL3 (GOWN DISPOSABLE) ×12
KIT BASIN OR (CUSTOM PROCEDURE TRAY) ×4 IMPLANT
KIT TURNOVER KIT B (KITS) ×4 IMPLANT
NDL HYPO 25GX1X1/2 BEV (NEEDLE) ×2 IMPLANT
NEEDLE HYPO 25GX1X1/2 BEV (NEEDLE) ×4 IMPLANT
NS IRRIG 1000ML POUR BTL (IV SOLUTION) ×4 IMPLANT
PACK EENT II TURBAN DRAPE (CUSTOM PROCEDURE TRAY) ×4 IMPLANT
PAD ARMBOARD 7.5X6 YLW CONV (MISCELLANEOUS) ×8 IMPLANT
PENCIL BUTTON HOLSTER BLD 10FT (ELECTRODE) ×2 IMPLANT
SOLUTION ANTI FOG 6CC (MISCELLANEOUS) ×2 IMPLANT
SPLINT NASAL DOYLE BI-VL (GAUZE/BANDAGES/DRESSINGS) ×4 IMPLANT
SPONGE NEURO XRAY DETECT 1X3 (DISPOSABLE) ×6 IMPLANT
SUT CHROMIC 3 0 SH 27 (SUTURE) ×2 IMPLANT
SUT CHROMIC 4 0 SH 27 (SUTURE) ×6 IMPLANT
SUT PLAIN 4 0 ~~LOC~~ 1 (SUTURE) ×4 IMPLANT
SUT PROLENE 2 0 FS (SUTURE) ×2 IMPLANT
SUT PROLENE 3 0 PS 2 (SUTURE) ×4 IMPLANT
SYR CONTROL 10ML LL (SYRINGE) IMPLANT
TRAY ENT MC OR (CUSTOM PROCEDURE TRAY) ×4 IMPLANT
TUBE SALEM SUMP 16 FR W/ARV (TUBING) ×2 IMPLANT
TUBING EXTENTION W/L.L. (IV SETS) IMPLANT
WATER STERILE IRR 1000ML POUR (IV SOLUTION) ×4 IMPLANT
YANKAUER SUCT BULB TIP NO VENT (SUCTIONS) ×4 IMPLANT

## 2018-08-18 NOTE — Op Note (Signed)
DATE OF PROCEDURE: 08/18/2018  OPERATIVE REPORT   SURGEON: Leta Baptist, MD   PREOPERATIVE DIAGNOSES:  1. Severe nasal septal deviation.  2. Bilateral inferior turbinate hypertrophy.  3. Chronic nasal obstruction. 4. Large right antrochoanal polyp.  POSTOPERATIVE DIAGNOSES:  1. Severe nasal septal deviation.  2. Bilateral inferior turbinate hypertrophy.  3. Chronic nasal obstruction. 4. Large right antrochoanal polyp.  PROCEDURE PERFORMED:  1. Septoplasty.  2. Bilateral partial inferior turbinate resection.  3. Right endoscopic maxillary antrostomy with removal of polyps.  ANESTHESIA: General endotracheal tube anesthesia.   COMPLICATIONS: None.   ESTIMATED BLOOD LOSS: 200 mL.   INDICATION FOR PROCEDURE: Kyle Collins is a 33 y.o. male with a history of chronic nasal obstruction. The patient was treated with steroid nasal spra and systemic steroids. However, the patient continued to be symptomatic. On examination, the patient was noted to have bilateral severe inferior turbinate hypertrophy and significant nasal septal deviation, causing significant nasal obstruction. He also has a large polyp within the right nasal cavity. On the CT scan, the patient was noted to have complete opacification of his right maxillary sinus, with expansion of the occluded axillary antrum, consistent with an antrochoanal polyp. Based on the above findings, the decision was made for the patient to undergo the above-stated procedures. The risks, benefits, alternatives, and details of the procedures were discussed with the patient. Questions were invited and answered. Informed consent was obtained.   DESCRIPTION OF PROCEDURE: The patient was taken to the operating room and placed supine on the operating table. General endotracheal tube anesthesia was administered by the anesthesiologist. The patient was positioned, and prepped and draped in the standard fashion for nasal surgery. Pledgets soaked with Afrin were  placed in both nasal cavities for decongestion. The pledgets were subsequently removed.   Examination of the nasal cavity revealed a severe nasal septal deviationd. 1% lidocaine with 1:100,000 epinephrine was injected onto the nasal septum bilaterally. A hemitransfixion incision was made on the left side. The mucosal flap was carefully elevated on the left side. A cartilaginous incision was made 1 cm superior to the caudal margin of the nasal septum. Mucosal flap was also elevated on the right side in the similar fashion. It should be noted that due to the severe septal deviation, the deviated portion of the cartilaginous and bony septum had to be removed in piecemeal fashion. Once the deviated portions were removed, a straight midline septum was achieved. The septum was then quilted with 4-0 plain gut sutures. The hemitransfixion incision was closed with interrupted 4-0 chromic sutures.   The inferior one half of both hypertrophied inferior turbinate was crossclamped with a Kelly clamp. The inferior one half of each inferior turbinate was then resected with a pair of cross cutting scissors. Hemostasis was achieved with a suction cautery device.   Using a 0 endoscope, the right nasal cavity was examined. A large antrochoanal polyp was noted. The polyp was removed using a combination of Blakesley forceps and microdebrider. The specimen was sent to the pathology department for permanent histologic identification. The uncinate process was resected with a freer elevator. The maxillary antrum was entered and enlarged using a combination of backbiter and microdebrider. More polypoid tissue was removed from the right maxillary sinus. The sinus was copiously irrigated with saline solution. Hemostasis was achieved with Nasopore packing. Doyle splints were applied to the nasal septum.  The care of the patient was turned over to the anesthesiologist. The patient was awakened from anesthesia without difficulty. The  patient was extubated and transferred to the recovery room in good condition.   OPERATIVE FINDINGS: Severe nasal septal deviation and bilateral inferior turbinate hypertrophy. Large right antrochoanal polyp.  SPECIMEN: Right antrochoanal polyp.  FOLLOWUP CARE: The patient will be admitted for overnight observation.The patient will follow up in my office in approximately 1 week for splint removal.   Tyaira Heward Raynelle Bring, MD

## 2018-08-18 NOTE — Progress Notes (Signed)
Dr. Christella Hartigan notified that patient's pre-surgery blood sugar is 273. Okay to proceed with surgery.

## 2018-08-18 NOTE — Progress Notes (Signed)
Inpatient Diabetes Program Recommendations  AACE/ADA: New Consensus Statement on Inpatient Glycemic Control (2015)  Target Ranges:  Prepandial:   less than 140 mg/dL      Peak postprandial:   less than 180 mg/dL (1-2 hours)      Critically ill patients:  140 - 180 mg/dL   Lab Results  Component Value Date   GLUCAP 354 (H) 08/18/2018   HGBA1C 10.3 (H) 08/11/2018    Review of Glycemic Control Results for [name], [name] (MRN [id number]) as of 08/18/2018 10:40  Ref. Range 08/18/2018 06:09 08/18/2018 10:25  Glucose-Capillary Latest Ref Range: 70 - 99 mg/dL 273 (H) 354 (H)   Diabetes history: DM 2 Outpatient Diabetes medications:  Jardiance 10 mg daily, Metformin 1000 mg bid- Unsure if patient is taking Current orders for Inpatient glycemic control:  Currently in PACU  Inpatient Diabetes Program Recommendations:    Please add Novolog correction tid with meals and HS.  A1C indicates uncontrolled DM.  May need addition of basal insulin as well while in the hospital.  Consider Lantus 20 units daily.  Needs close f/u for diabetes and elevated A1C.   Thanks,  Adah Perl, RN, BC-ADM Inpatient Diabetes Coordinator Pager [phone] (8a-5p)

## 2018-08-18 NOTE — Anesthesia Postprocedure Evaluation (Signed)
Anesthesia Post Note  Patient: Kyle Collins  Procedure(s) Performed: NASAL SEPTOPLASTY WITH TURBINATE REDUCTION (Bilateral Nose) ENDOSCOPIC MAXILLARY RIGHT ANTROSTOMY WITH TISSUE REMOVAL (Right Nose)     Patient location during evaluation: PACU Anesthesia Type: General Level of consciousness: awake and alert Pain management: pain level controlled Vital Signs Assessment: post-procedure vital signs reviewed and stable Respiratory status: spontaneous breathing, nonlabored ventilation, respiratory function stable and patient connected to nasal cannula oxygen Cardiovascular status: blood pressure returned to baseline and stable Postop Assessment: no apparent nausea or vomiting Anesthetic complications: no    Last Vitals:  Vitals:   08/18/18 1235 08/18/18 1303  BP: (!) 141/101 (!) 152/105  Pulse: 71 (!) 112  Resp: 12 18  Temp:  36.8 C  SpO2: 98% 96%    Last Pain:  Vitals:   08/18/18 1303  TempSrc: Oral  PainSc:                  [name]

## 2018-08-18 NOTE — Discharge Instructions (Signed)
POSTOPERATIVE INSTRUCTIONS FOR PATIENTS HAVING NASAL OR SINUS OPERATIONS °ACTIVITY: Restrict activity at home for the first two days, resting as much as possible. Light activity is best. You may usually return to work within a week. You should refrain from nose blowing, strenuous activity, or heavy lifting greater than 20lbs for a total of three weeks after your operation.  If sneezing cannot be avoided, sneeze with your mouth open. °DISCOMFORT: You may experience a dull headache and pressure along with nasal congestion and discharge. These symptoms may be worse during the first week after the operation but may last as [place] as two to four weeks.  Please take Tylenol or the pain medication that has been prescribed for you. Do not take aspirin or aspirin containing medications since they may cause bleeding.  You may experience symptoms of post nasal drainage, nasal congestion, headaches and fatigue for two or three months after your operation.  °BLEEDING: You may have some blood tinged nasal drainage for approximately two weeks after the operation.  The discharge will be worse for the first week.  Please call our office at [phone] or go to the nearest hospital emergency room if you experience any of the following: heavy, bright red blood from your nose or mouth that lasts longer than ten minutes or coughing up or vomiting bright red blood or blood clots. °GENERAL CONSIDERATIONS: °1. A gauze dressing will be placed on your upper lip to absorb any drainage after the operation. You may need to change this several times a day.  If you do not have very much drainage, you may remove the dressing.  Remember that you may gently wipe your nose with a tissue and sniff in, but DO NOT blow your nose. °2. Please keep all of your postoperative appointments.  Your final results after the operation will depend on proper follow-up.  The initial visit is usually four to seven days after the operation.  During this visit, the  remaining nasal packing and internal septal splints will be removed.  Your nasal and sinus cavities will be cleaned.  During the second visit, your nasal and sinus cavities will be cleaned again. Have someone drive you to your first two postoperative appointments. We suggest that you take your prescribed pain medication about ½ hour prior to each of these two appointments.  °3. How you care for your nose after the operation will influence the results that you obtain.  You should follow all directions, take your medication as prescribed, and call our office [phone] with any problems or questions. °4. You may be more comfortable sleeping with your head elevated on two pillows. °5. Do not take any medications that we have not prescribed or recommended. °WARNING SIGNS: if any of the following should occur, please call our office: °1. Bright red bleeding which lasts more than 10 minutes. °2. Persistent fever greater than 102F. °3. Persistent vomiting. °4. Severe and constant pain that is not relieved by prescribed pain medication. °5. Trauma to the nose. °6. Rash or unusual side effects from any medicines. ° °

## 2018-08-18 NOTE — Transfer of Care (Signed)
Immediate Anesthesia Transfer of Care Note  Patient: Kyle Collins  Procedure(s) Performed: NASAL SEPTOPLASTY WITH TURBINATE REDUCTION (Bilateral Nose) ENDOSCOPIC MAXILLARY RIGHT ANTROSTOMY WITH TISSUE REMOVAL (Right Nose)  Patient Location: PACU  Anesthesia Type:General  Level of Consciousness: awake, alert  and oriented  Airway & Oxygen Therapy: Patient Spontanous Breathing  Post-op Assessment: Report given to RN and Post -op Vital signs reviewed and stable  Post vital signs: Reviewed and stable  Last Vitals:  Vitals Value Taken Time  BP    Temp    Pulse 100 08/18/2018 10:20 AM  Resp 17 08/18/2018 10:20 AM  SpO2 99 % 08/18/2018 10:20 AM  Vitals shown include unvalidated device data.  Last Pain:  Vitals:   08/18/18 0707  TempSrc:   PainSc: 9       Patients Stated Pain Goal: 9 (56/94/37 0052)  Complications: No apparent anesthesia complications

## 2018-08-18 NOTE — Anesthesia Procedure Notes (Signed)
Procedure Name: Intubation Date/Time: 08/18/2018 8:56 AM Performed by: Trinna Post., CRNA Pre-anesthesia Checklist: Patient identified, Emergency Drugs available, Suction available, Patient being monitored and Timeout performed Patient Re-evaluated:Patient Re-evaluated prior to induction Oxygen Delivery Method: Circle system utilized Preoxygenation: Pre-oxygenation with 100% oxygen Induction Type: IV induction Ventilation: Mask ventilation without difficulty and Oral airway inserted - appropriate to patient size Laryngoscope Size: Mac and 4 Grade View: Grade I Tube type: Oral Tube size: 7.5 mm Number of attempts: 1 Airway Equipment and Method: Stylet Placement Confirmation: ETT inserted through vocal cords under direct vision,  positive ETCO2 and breath sounds checked- equal and bilateral Secured at: 23 cm Tube secured with: Tape Dental Injury: Teeth and Oropharynx as per pre-operative assessment

## 2018-08-18 NOTE — OR Nursing (Signed)
Patient black metal frame glasses placed in clear sealed bag with patient label and attached to patient chart.

## 2018-08-18 NOTE — H&P (Signed)
Cc: Chronic nasal obstruction, nasal polyp  HPI: The patient is a 33 year old male who returns today for his follow-up evaluation.  The patient has a history of chronic nasal obstruction, worse on the right side.  At his last appointment, he was noted to have a large polyp, obstructing his right middle meatus and right nasal cavity.  He was also noted to have severe nasal septal deviation and bilateral inferior turbinate hypertrophy.  He underwent a recent sinus CT scan.  The CT showed complete opacification of his right maxillary sinus, with expansion of the occluded maxillary antrum, consistent with an antrochoanal polyp.  The other paranasal sinuses were clear.  In addition, the patient has significant nasal septal deviation and bilateral inferior turbinate hypertrophy.  The patient returns today reporting difficulty with his nasal breathing, especially on the right side.  He was previously treated with Flonase nasal spray without improvement of his symptoms.  He was recently diagnosed with hemochromatosis.  He is being treated with regular phlebotomy.  No other ENT, GI, or respiratory issue noted since the last visit.   Exam: General: Communicates without difficulty, well nourished, no acute distress. Head: Normocephalic, no evidence injury, no tenderness, facial buttresses intact without stepoff. Eyes: PERRL, EOMI. No scleral icterus, conjunctivae clear. Neuro: CN II exam reveals vision grossly intact.  No nystagmus at any point of gaze. Ears: Auricles well formed without lesions.  Ear canals are intact without mass or lesion.  No erythema or edema is appreciated.  The TMs are intact without fluid. Nose: External evaluation reveals normal support and skin without lesions.  Dorsum is intact.  Anterior rhinoscopy reveals congested and edematous mucosa over anterior aspect of the inferior turbinates and deviated nasal septum.  Oral:  Oral cavity and oropharynx are intact, symmetric, without erythema or  edema.  Mucosa is moist without lesions. Neck: Full range of motion without pain.  There is no significant lymphadenopathy.  No masses palpable.  Thyroid bed within normal limits to palpation.  Parotid glands and submandibular glands equal bilaterally without mass.  Trachea is midline. Neuro:  CN 2-12 grossly intact. Gait normal. Vestibular: No nystagmus at any point of gaze.   Assessment  1.  Chronic right maxillary sinusitis with large antrochoanal polyp.  2.  Chronic nasal obstruction, secondary to severe septal deviation and bilateral inferior turbinate hypertrophy.   Plan 1.  The physical exam findings and the CT images are extensively reviewed with the patient.  2.  The treatment options are discussed.  Based on the above findings, the patient will likely benefit from surgical intervention with right maxillary antrostomy with polyp removal, septoplasty and turbinate reduction.  The risks, benefits, and details of the procedures are extensively discussed.   3.  The patient would like to proceed with the procedures.

## 2018-08-19 ENCOUNTER — Encounter (HOSPITAL_COMMUNITY): Payer: Self-pay | Admitting: Otolaryngology

## 2018-08-19 DIAGNOSIS — J3489 Other specified disorders of nose and nasal sinuses: Secondary | ICD-10-CM | POA: Diagnosis not present

## 2018-08-19 DIAGNOSIS — J330 Polyp of nasal cavity: Secondary | ICD-10-CM | POA: Diagnosis not present

## 2018-08-19 DIAGNOSIS — J320 Chronic maxillary sinusitis: Secondary | ICD-10-CM | POA: Diagnosis not present

## 2018-08-19 DIAGNOSIS — J342 Deviated nasal septum: Secondary | ICD-10-CM | POA: Diagnosis not present

## 2018-08-19 DIAGNOSIS — J343 Hypertrophy of nasal turbinates: Secondary | ICD-10-CM | POA: Diagnosis not present

## 2018-08-19 LAB — GLUCOSE, CAPILLARY: GLUCOSE-CAPILLARY: 290 mg/dL — AB (ref 70–99)

## 2018-08-19 NOTE — Discharge Summary (Signed)
Physician Discharge Summary  Patient ID: Kyle Collins MRN: [id number] DOB/AGE: 08-17-1985 33 y.o.  Admit date: 08/18/2018 Discharge date: 08/19/2018  Admission Diagnoses: Chronic nasal obstruction, right nasal polyps  Discharge Diagnoses: Chronic nasal obstruction, right nasal polyps Active Problems:   S/P nasal septoplasty   Discharged Condition: good  Hospital Course: Pt had an uneventful overnight stay. Pt tolerated po well. No significant bleeding.   Consults: None  Significant Diagnostic Studies: None  Treatments: surgery: Septoplasty, bilateral turbinate reduction, right maxillary antrostomy with polyp removal.  Discharge Exam: Blood pressure (!) 163/109, pulse 95, temperature 98.4 F (36.9 C), temperature source Oral, resp. rate 18, height '5\' 11"'$  (1.803 m), weight (!) 151.2 kg, SpO2 99 %.    Disposition: Discharge disposition: 01-Home or Self Care       Discharge Instructions    Activity as tolerated - No restrictions   Complete by:  As directed    Diet general   Complete by:  As directed      Allergies as of 08/19/2018      Reactions   Methylphenidate Derivatives Other (See Comments)   Patient states it made him "act like a zombie" when given as a child      Medication List    TAKE these medications   amLODipine 10 MG tablet Commonly known as:  NORVASC Take 10 mg by mouth daily.   amLODipine 2.5 MG tablet Commonly known as:  NORVASC Take 2.5 mg by mouth daily.   amoxicillin 875 MG tablet Commonly known as:  AMOXIL Take 1 tablet (875 mg total) by mouth 2 (two) times daily for 5 days.   blood glucose meter kit and supplies Kit Dispense based on patient and insurance preference. Use up to 2 times daily as directed. (FOR ICD-10 E11.65)   citalopram 20 MG tablet Commonly known as:  CELEXA Take 20 mg by mouth daily.   empagliflozin 10 MG Tabs tablet Commonly known as:  JARDIANCE Take 10 mg by mouth daily.   hydrochlorothiazide 25 MG  tablet Commonly known as:  HYDRODIURIL TAKE ONE TABLET BY MOUTH DAILY.   lisinopril 40 MG tablet Commonly known as:  PRINIVIL,ZESTRIL Take 1 tablet (40 mg total) by mouth daily.   metFORMIN 500 MG 24 hr tablet Commonly known as:  GLUCOPHAGE-XR Take 1,000 mg by mouth 2 (two) times daily.   oxyCODONE-acetaminophen 5-325 MG tablet Commonly known as:  PERCOCET Take 1-2 tablets by mouth every 4 (four) hours as needed for severe pain.   traZODone 150 MG tablet Commonly known as:  DESYREL Take 75-150 mg by mouth at bedtime as needed for sleep.      Follow-up Information    Leta Baptist, MD On 08/23/2018.   Specialty:  Otolaryngology Why:  at [employer] information: [address] [place] [address] [phone]           Signed: Burley Saver 08/19/2018, 8:54 AM

## 2018-08-19 NOTE — Progress Notes (Signed)
Pt is discharged to go home.  Discharge instructions and prescription information given 

## 2018-08-23 ENCOUNTER — Ambulatory Visit (INDEPENDENT_AMBULATORY_CARE_PROVIDER_SITE_OTHER): Payer: Medicare Other | Admitting: Otolaryngology

## 2018-08-23 DIAGNOSIS — J338 Other polyp of sinus: Secondary | ICD-10-CM | POA: Diagnosis not present

## 2018-08-23 DIAGNOSIS — J320 Chronic maxillary sinusitis: Secondary | ICD-10-CM

## 2018-08-24 DIAGNOSIS — D751 Secondary polycythemia: Secondary | ICD-10-CM | POA: Diagnosis not present

## 2018-08-24 DIAGNOSIS — R7989 Other specified abnormal findings of blood chemistry: Secondary | ICD-10-CM | POA: Diagnosis not present

## 2018-08-24 DIAGNOSIS — J339 Nasal polyp, unspecified: Secondary | ICD-10-CM | POA: Diagnosis not present

## 2018-08-24 DIAGNOSIS — G4730 Sleep apnea, unspecified: Secondary | ICD-10-CM | POA: Diagnosis not present

## 2018-09-09 ENCOUNTER — Ambulatory Visit (INDEPENDENT_AMBULATORY_CARE_PROVIDER_SITE_OTHER): Payer: Medicare Other | Admitting: Otolaryngology

## 2018-09-30 ENCOUNTER — Ambulatory Visit (INDEPENDENT_AMBULATORY_CARE_PROVIDER_SITE_OTHER): Payer: Medicare Other | Admitting: Otolaryngology

## 2018-09-30 DIAGNOSIS — J338 Other polyp of sinus: Secondary | ICD-10-CM | POA: Diagnosis not present

## 2018-09-30 DIAGNOSIS — J320 Chronic maxillary sinusitis: Secondary | ICD-10-CM

## 2018-10-25 DIAGNOSIS — D751 Secondary polycythemia: Secondary | ICD-10-CM | POA: Diagnosis not present

## 2018-10-25 DIAGNOSIS — G4730 Sleep apnea, unspecified: Secondary | ICD-10-CM | POA: Diagnosis not present

## 2018-10-25 DIAGNOSIS — R7989 Other specified abnormal findings of blood chemistry: Secondary | ICD-10-CM | POA: Diagnosis not present

## 2018-11-01 DIAGNOSIS — R7989 Other specified abnormal findings of blood chemistry: Secondary | ICD-10-CM | POA: Diagnosis not present

## 2018-11-01 DIAGNOSIS — D751 Secondary polycythemia: Secondary | ICD-10-CM | POA: Diagnosis not present

## 2018-11-16 DIAGNOSIS — E1165 Type 2 diabetes mellitus with hyperglycemia: Secondary | ICD-10-CM | POA: Diagnosis not present

## 2018-11-16 DIAGNOSIS — I10 Essential (primary) hypertension: Secondary | ICD-10-CM | POA: Diagnosis not present

## 2018-11-16 DIAGNOSIS — F3131 Bipolar disorder, current episode depressed, mild: Secondary | ICD-10-CM | POA: Diagnosis not present

## 2018-11-16 DIAGNOSIS — E782 Mixed hyperlipidemia: Secondary | ICD-10-CM | POA: Diagnosis not present

## 2018-11-16 DIAGNOSIS — E119 Type 2 diabetes mellitus without complications: Secondary | ICD-10-CM | POA: Diagnosis not present

## 2018-11-16 DIAGNOSIS — Z0000 Encounter for general adult medical examination without abnormal findings: Secondary | ICD-10-CM | POA: Diagnosis not present

## 2018-11-20 ENCOUNTER — Emergency Department (HOSPITAL_COMMUNITY): Payer: Medicare Other

## 2018-11-20 ENCOUNTER — Other Ambulatory Visit: Payer: Self-pay

## 2018-11-20 ENCOUNTER — Encounter (HOSPITAL_COMMUNITY): Payer: Self-pay | Admitting: *Deleted

## 2018-11-20 ENCOUNTER — Inpatient Hospital Stay (HOSPITAL_COMMUNITY)
Admission: EM | Admit: 2018-11-20 | Discharge: 2018-11-24 | DRG: 988 | Disposition: A | Discharge status: Home / Self Care | Payer: Medicare Other | Attending: Internal Medicine | Admitting: Internal Medicine

## 2018-11-20 DIAGNOSIS — R0689 Other abnormalities of breathing: Secondary | ICD-10-CM | POA: Diagnosis not present

## 2018-11-20 DIAGNOSIS — Z833 Family history of diabetes mellitus: Secondary | ICD-10-CM

## 2018-11-20 DIAGNOSIS — R000 Tachycardia, unspecified: Secondary | ICD-10-CM | POA: Diagnosis not present

## 2018-11-20 DIAGNOSIS — K8020 Calculus of gallbladder without cholecystitis without obstruction: Secondary | ICD-10-CM | POA: Diagnosis present

## 2018-11-20 DIAGNOSIS — Z6841 Body Mass Index (BMI) 40.0 and over, adult: Secondary | ICD-10-CM

## 2018-11-20 DIAGNOSIS — I10 Essential (primary) hypertension: Secondary | ICD-10-CM | POA: Diagnosis present

## 2018-11-20 DIAGNOSIS — R42 Dizziness and giddiness: Secondary | ICD-10-CM | POA: Diagnosis not present

## 2018-11-20 DIAGNOSIS — K8010 Calculus of gallbladder with chronic cholecystitis without obstruction: Secondary | ICD-10-CM | POA: Diagnosis present

## 2018-11-20 DIAGNOSIS — E11649 Type 2 diabetes mellitus with hypoglycemia without coma: Secondary | ICD-10-CM | POA: Diagnosis present

## 2018-11-20 DIAGNOSIS — F319 Bipolar disorder, unspecified: Secondary | ICD-10-CM | POA: Diagnosis present

## 2018-11-20 DIAGNOSIS — Z79891 Long term (current) use of opiate analgesic: Secondary | ICD-10-CM

## 2018-11-20 DIAGNOSIS — K2970 Gastritis, unspecified, without bleeding: Secondary | ICD-10-CM | POA: Diagnosis present

## 2018-11-20 DIAGNOSIS — R109 Unspecified abdominal pain: Secondary | ICD-10-CM

## 2018-11-20 DIAGNOSIS — Z23 Encounter for immunization: Secondary | ICD-10-CM

## 2018-11-20 DIAGNOSIS — D751 Secondary polycythemia: Secondary | ICD-10-CM | POA: Diagnosis present

## 2018-11-20 DIAGNOSIS — K8000 Calculus of gallbladder with acute cholecystitis without obstruction: Secondary | ICD-10-CM | POA: Diagnosis not present

## 2018-11-20 DIAGNOSIS — Z9114 Patient's other noncompliance with medication regimen: Secondary | ICD-10-CM

## 2018-11-20 DIAGNOSIS — E039 Hypothyroidism, unspecified: Secondary | ICD-10-CM | POA: Diagnosis present

## 2018-11-20 DIAGNOSIS — Z87891 Personal history of nicotine dependence: Secondary | ICD-10-CM

## 2018-11-20 DIAGNOSIS — Z7984 Long term (current) use of oral hypoglycemic drugs: Secondary | ICD-10-CM

## 2018-11-20 DIAGNOSIS — G4730 Sleep apnea, unspecified: Secondary | ICD-10-CM | POA: Diagnosis present

## 2018-11-20 DIAGNOSIS — F909 Attention-deficit hyperactivity disorder, unspecified type: Secondary | ICD-10-CM | POA: Diagnosis present

## 2018-11-20 DIAGNOSIS — E1110 Type 2 diabetes mellitus with ketoacidosis without coma: Principal | ICD-10-CM | POA: Diagnosis present

## 2018-11-20 DIAGNOSIS — R52 Pain, unspecified: Secondary | ICD-10-CM | POA: Diagnosis not present

## 2018-11-20 DIAGNOSIS — K219 Gastro-esophageal reflux disease without esophagitis: Secondary | ICD-10-CM | POA: Diagnosis present

## 2018-11-20 DIAGNOSIS — K5900 Constipation, unspecified: Secondary | ICD-10-CM | POA: Diagnosis present

## 2018-11-20 DIAGNOSIS — Z79899 Other long term (current) drug therapy: Secondary | ICD-10-CM

## 2018-11-20 DIAGNOSIS — R1084 Generalized abdominal pain: Secondary | ICD-10-CM | POA: Diagnosis not present

## 2018-11-20 LAB — CBC WITH DIFFERENTIAL/PLATELET
Abs Immature Granulocytes: 0.12 10*3/uL — ABNORMAL HIGH (ref 0.00–0.07)
Basophils Absolute: 0.1 10*3/uL (ref 0.0–0.1)
Basophils Relative: 0 %
Eosinophils Absolute: 0 10*3/uL (ref 0.0–0.5)
Eosinophils Relative: 0 %
HCT: 52.7 % — ABNORMAL HIGH (ref 39.0–52.0)
Hemoglobin: 18.3 g/dL — ABNORMAL HIGH (ref 13.0–17.0)
Immature Granulocytes: 1 %
Lymphocytes Relative: 10 %
Lymphs Abs: 1.6 10*3/uL (ref 0.7–4.0)
MCH: 30.3 pg (ref 26.0–34.0)
MCHC: 34.7 g/dL (ref 30.0–36.0)
MCV: 87.4 fL (ref 80.0–100.0)
Monocytes Absolute: 0.6 10*3/uL (ref 0.1–1.0)
Monocytes Relative: 4 %
Neutro Abs: 13.2 10*3/uL — ABNORMAL HIGH (ref 1.7–7.7)
Neutrophils Relative %: 85 %
Platelets: 435 10*3/uL — ABNORMAL HIGH (ref 150–400)
RBC: 6.03 MIL/uL — ABNORMAL HIGH (ref 4.22–5.81)
RDW: 12.5 % (ref 11.5–15.5)
WBC: 15.6 10*3/uL — ABNORMAL HIGH (ref 4.0–10.5)
nRBC: 0 % (ref 0.0–0.2)

## 2018-11-20 LAB — COMPREHENSIVE METABOLIC PANEL
ALT: 29 U/L (ref 0–44)
AST: 20 U/L (ref 15–41)
Albumin: 4.7 g/dL (ref 3.5–5.0)
Alkaline Phosphatase: 87 U/L (ref 38–126)
Anion gap: 28 — ABNORMAL HIGH (ref 5–15)
BUN: 36 mg/dL — ABNORMAL HIGH (ref 6–20)
CO2: 13 mmol/L — ABNORMAL LOW (ref 22–32)
Calcium: 9.7 mg/dL (ref 8.9–10.3)
Chloride: 90 mmol/L — ABNORMAL LOW (ref 98–111)
Creatinine, Ser: 1.19 mg/dL (ref 0.61–1.24)
GFR calc Af Amer: >60 mL/min (ref >60)
GFR calc non Af Amer: >60 mL/min (ref >60)
Glucose, Bld: 321 mg/dL — ABNORMAL HIGH (ref 70–99)
Potassium: 4.4 mmol/L (ref 3.5–5.1)
Sodium: 131 mmol/L — ABNORMAL LOW (ref 135–145)
Total Bilirubin: 2.4 mg/dL — ABNORMAL HIGH (ref 0.3–1.2)
Total Protein: 8.8 g/dL — ABNORMAL HIGH (ref 6.5–8.1)

## 2018-11-20 LAB — URINALYSIS, ROUTINE W REFLEX MICROSCOPIC
Bacteria, UA: NONE SEEN
Bilirubin Urine: NEGATIVE
Glucose, UA: >=500 mg/dL — AB
Hgb urine dipstick: NEGATIVE
Ketones, ur: 80 mg/dL — AB
Leukocytes,Ua: NEGATIVE
Nitrite: NEGATIVE
Protein, ur: NEGATIVE mg/dL
Specific Gravity, Urine: 1.039 — ABNORMAL HIGH (ref 1.005–1.030)
pH: 5 (ref 5.0–8.0)

## 2018-11-20 LAB — LACTIC ACID, PLASMA
Lactic Acid, Venous: 2 mmol/L (ref 0.5–1.9)
Lactic Acid, Venous: 1.4 mmol/L (ref 0.5–1.9)

## 2018-11-20 LAB — CBG MONITORING, ED
Glucose-Capillary: 187 mg/dL — ABNORMAL HIGH (ref 70–99)
Glucose-Capillary: 262 mg/dL — ABNORMAL HIGH (ref 70–99)

## 2018-11-20 LAB — LIPASE, BLOOD: Lipase: 24 U/L (ref 11–51)

## 2018-11-20 MED ORDER — DEXTROSE-NACL 5-0.45 % IV SOLN: INTRAVENOUS | Status: DC

## 2018-11-20 MED ORDER — IOHEXOL 300 MG/ML  SOLN
100.0000 mL | Freq: Once | INTRAMUSCULAR | Status: AC | PRN
  Administered 2018-11-20: 100 mL via INTRAVENOUS

## 2018-11-20 MED ORDER — SODIUM CHLORIDE 0.9 % IV SOLN
INTRAVENOUS | Status: DC
  Administered 2018-11-21 (×2): via INTRAVENOUS

## 2018-11-20 MED ORDER — SODIUM CHLORIDE 0.9 % IV BOLUS
1000.0000 mL | Freq: Once | INTRAVENOUS | Status: AC
  Administered 2018-11-20: 1000 mL via INTRAVENOUS

## 2018-11-20 MED ORDER — SODIUM CHLORIDE 0.9 % IV SOLN
INTRAVENOUS | Status: DC
  Administered 2018-11-21 – 2018-11-22 (×2): via INTRAVENOUS

## 2018-11-20 MED ORDER — INSULIN REGULAR(HUMAN) IN NACL 100-0.9 UT/100ML-% IV SOLN: INTRAVENOUS | Status: DC

## 2018-11-20 MED ORDER — SODIUM CHLORIDE 0.45 % IV BOLUS
1000.0000 mL | Freq: Once | INTRAVENOUS | Status: AC
  Administered 2018-11-21: 1000 mL via INTRAVENOUS

## 2018-11-20 MED ORDER — SODIUM CHLORIDE 0.9 % IV BOLUS
1000.0000 mL | Freq: Once | INTRAVENOUS | Status: AC
  Administered 2018-11-21: 1000 mL via INTRAVENOUS

## 2018-11-20 MED ORDER — ONDANSETRON HCL 4 MG PO TABS
4.0000 mg | ORAL_TABLET | Freq: Four times a day (QID) | ORAL | Status: DC | PRN
  Administered 2018-11-21: 4 mg via ORAL
  Filled 2018-11-20: qty 1

## 2018-11-20 MED ORDER — SODIUM CHLORIDE 0.9 % IV SOLN: INTRAVENOUS | Status: DC

## 2018-11-20 MED ORDER — INSULIN ASPART 100 UNIT/ML ~~LOC~~ SOLN
10.0000 [IU] | Freq: Once | SUBCUTANEOUS | Status: AC
  Administered 2018-11-20: 10 [IU] via INTRAVENOUS
  Filled 2018-11-20: qty 1

## 2018-11-20 MED ORDER — HYDROMORPHONE HCL 1 MG/ML IJ SOLN
1.0000 mg | Freq: Once | INTRAMUSCULAR | Status: AC
  Administered 2018-11-20: 1 mg via INTRAVENOUS
  Filled 2018-11-20: qty 1

## 2018-11-20 MED ORDER — ONDANSETRON HCL 4 MG/2ML IJ SOLN
4.0000 mg | Freq: Four times a day (QID) | INTRAMUSCULAR | Status: DC | PRN
  Administered 2018-11-22: 4 mg via INTRAVENOUS
  Filled 2018-11-20: qty 2

## 2018-11-20 MED ORDER — INSULIN REGULAR(HUMAN) IN NACL 100-0.9 UT/100ML-% IV SOLN
INTRAVENOUS | Status: DC
  Filled 2018-11-20: qty 100

## 2018-11-20 MED ORDER — DEXTROSE-NACL 5-0.45 % IV SOLN
INTRAVENOUS | Status: DC
  Administered 2018-11-21: 08:00:00 via INTRAVENOUS

## 2018-11-20 MED ORDER — POTASSIUM CHLORIDE 10 MEQ/100ML IV SOLN
10.0000 meq | INTRAVENOUS | Status: AC
  Administered 2018-11-21 (×2): 10 meq via INTRAVENOUS
  Filled 2018-11-20 (×2): qty 100

## 2018-11-20 NOTE — ED Provider Notes (Signed)
[facility] EMERGENCY DEPARTMENT Provider Note   CSN: [id number] Arrival date & time: 11/20/18  2039    History   Chief Complaint Chief Complaint  Patient presents with   Abdominal Pain    HPI Kyle Collins is a 33 y.o. male.     HPI   Kyle Collins is a 33 y.o. obese male with PHM of uncontrolled DM, HTN and medication noncompliance, presents to the Emergency Department complaining of mid right abdominal pain, nausea and vomiting, dry mouth, and a burning sensation of his central chest and throat.  States he initially developed vomiting and then abdominal pain developed the following day.  He states that he thought he had food poisoning.  Reports multiple episodes of vomiting.  States he is unable to keep down any fluids and requesting oral fluids.  He admits that he does not check his blood sugar and does not take his diabetes medication.  He denies chest pain, shortness of breath, dysuria, back pain.  No hematemesis.  Last bowel movement was two days ago.    PCP Dr. Freida Busman  Past Medical History:  Diagnosis Date   ADHD (attention deficit hyperactivity disorder)    Anxiety    Bipolar disorder ([place])    Depression    Diabetes mellitus    GERD (gastroesophageal reflux disease)    Headache    Hemochromatosis 2019   Hypertension    Polycythemia    Sleep apnea    Thyroid disease     Patient Active Problem List   Diagnosis Date Noted   S/P nasal septoplasty 08/18/2018   Fracture dislocation of right shoulder joint 12/04/2015   Dislocation, shoulder closed    Greater tuberosity of humerus fracture    Morbid obesity due to excess calories ([place]) 10/26/2015   Uncontrolled type 2 diabetes mellitus without complication, without Stecher-term current use of insulin ([place]) 07/16/2015   Essential hypertension, benign 07/16/2015   Personal history of noncompliance with medical treatment, presenting hazards to health 07/16/2015    Past Surgical History:    Procedure Laterality Date   CIRCUMCISION N/A 09/14/2017   Procedure: CIRCUMCISION;  Surgeon: Cleon Gustin, MD;  Location: [facility];  Service: Urology;  Laterality: N/A;  1 HR 336 [phone] - He has a Education officer, museum that needs to be called if changes made Mateo Flow @ [facility]     scrotum   MAXILLARY ANTROSTOMY Right 08/18/2018   Procedure: ENDOSCOPIC MAXILLARY RIGHT ANTROSTOMY WITH TISSUE REMOVAL;  Surgeon: Leta Baptist, MD;  Location: [facility];  Service: ENT;  Laterality: Right;   NASAL SEPTOPLASTY W/ TURBINOPLASTY Bilateral 08/18/2018   Procedure: NASAL SEPTOPLASTY WITH TURBINATE REDUCTION;  Surgeon: Leta Baptist, MD;  Location: [facility];  Service: ENT;  Laterality: Bilateral;   NASAL SEPTUM SURGERY  08/18/2018   ORIF SHOULDER FRACTURE Right 02/28/2016   Procedure: OPEN REDUCTION INTERNAL FIXATION (ORIF) SHOULDER FRACTURE;  Surgeon: Carole Civil, MD;  Location: [facility];  Service: Orthopedics;  Laterality: Right;   SHOULDER CLOSED REDUCTION Right 12/04/2015   Procedure: CLOSED REDUCTION SHOULDER;  Surgeon: Carole Civil, MD;  Location: [facility];  Service: Orthopedics;  Laterality: Right;   TOOTH EXTRACTION  spring 2016   top left         Home Medications    Prior to Admission medications   Medication Sig Start Date End Date Taking? Authorizing Provider  amLODipine (NORVASC) 10 MG tablet Take 10 mg by mouth daily.  [provider]  amLODipine (NORVASC) 2.5 MG tablet Take 2.5 mg by mouth daily.    [provider]  blood glucose meter kit and supplies KIT Dispense based on patient and insurance preference. Use up to 2 times daily as directed. (FOR ICD-10 E11.65) 08/11/16   Cassandria Anger, MD  citalopram (CELEXA) 20 MG tablet Take 20 mg by mouth daily.     [provider]  empagliflozin (JARDIANCE) 10 MG TABS tablet Take 10 mg by mouth daily. 03/05/17   Cassandria Anger, MD   hydrochlorothiazide (HYDRODIURIL) 25 MG tablet TAKE ONE TABLET BY MOUTH DAILY. Patient taking differently: Take 25 mg by mouth daily.  06/02/17   Cassandria Anger, MD  lisinopril (PRINIVIL,ZESTRIL) 40 MG tablet Take 1 tablet (40 mg total) by mouth daily. 06/26/14   Milton Ferguson, MD  metFORMIN (GLUCOPHAGE-XR) 500 MG 24 hr tablet Take 1,000 mg by mouth 2 (two) times daily.     [provider]  oxyCODONE-acetaminophen (PERCOCET) 5-325 MG tablet Take 1-2 tablets by mouth every 4 (four) hours as needed for severe pain. 08/18/18   Leta Baptist, MD  traZODone (DESYREL) 150 MG tablet Take 75-150 mg by mouth at bedtime as needed for sleep.     [provider]    Family History Family History  Problem Relation Age of Onset   Diabetes Mother    Diabetes Father     Social History Social History   Tobacco Use   Smoking status: Former Smoker    Packs/day: 0.50    Years: 5.00    Pack years: 2.50    Types: E-cigarettes    Last attempt to quit: 02/24/2009    Years since quitting: 9.7   Smokeless tobacco: Never Used  Substance Use Topics   Alcohol use: Yes    Comment: once year   Drug use: No     Allergies   Methylphenidate derivatives   Review of Systems Review of Systems  Constitutional: Negative for chills and fever.  HENT: Positive for sore throat. Negative for trouble swallowing.   Respiratory: Negative for cough and shortness of breath.   Cardiovascular: Negative for chest pain and leg swelling.  Gastrointestinal: Positive for abdominal pain, nausea and vomiting. Negative for diarrhea.  Endocrine: Positive for polydipsia.  Genitourinary: Negative for decreased urine volume and dysuria.  Musculoskeletal: Negative for back pain.  Skin: Negative for rash.  Neurological: Negative for dizziness, syncope, weakness and headaches.  Psychiatric/Behavioral: Negative for confusion.     Physical Exam Updated Vital Signs BP 98/63 (BP Location: Left Arm)     Pulse (!) 125    Temp 98.7 F (37.1 C) (Oral)    Resp 14    Ht '5\' 11"'$  (1.803 m)    Wt (!) 145.2 kg    SpO2 96%    BMI 44.63 kg/m   Physical Exam Vitals signs and nursing note reviewed.  Constitutional:      General: He is not in acute distress.    Appearance: He is obese. He is not toxic-appearing.  HENT:     Mouth/Throat:     Mouth: Mucous membranes are dry.     Pharynx: Oropharynx is clear. Uvula midline.  Neck:     Musculoskeletal: Normal range of motion. No muscular tenderness.  Cardiovascular:     Rate and Rhythm: Tachycardia present.     Pulses: Normal pulses.  Pulmonary:     Effort: Pulmonary effort is normal.     Breath sounds: Normal breath sounds.  No wheezing.  Chest:     Chest wall: No tenderness.  Abdominal:     General: There is no distension.     Palpations: Abdomen is soft.     Tenderness: There is abdominal tenderness.     Comments: Mild to moderate ttp of the right mid to upper abdomen.  No guarding or rebound tenderness.    Musculoskeletal: Normal range of motion.  Lymphadenopathy:     Cervical: No cervical adenopathy.  Skin:    General: Skin is warm.     Capillary Refill: Capillary refill takes less than 2 seconds.     Findings: No rash.  Neurological:     General: No focal deficit present.      ED Treatments / Results  Labs (all labs ordered are listed, but only abnormal results are displayed) Labs Reviewed  COMPREHENSIVE METABOLIC PANEL - Abnormal; Notable for the following components:      Result Value   Sodium 131 (*)    Chloride 90 (*)    CO2 13 (*)    Glucose, Bld 321 (*)    BUN 36 (*)    Total Protein 8.8 (*)    Total Bilirubin 2.4 (*)    Anion gap 28 (*)    All other components within normal limits  CBC WITH DIFFERENTIAL/PLATELET - Abnormal; Notable for the following components:   WBC 15.6 (*)    RBC 6.03 (*)    Hemoglobin 18.3 (*)    HCT 52.7 (*)    Platelets 435 (*)    Neutro Abs 13.2 (*)    Abs Immature Granulocytes 0.12 (*)     All other components within normal limits  LACTIC ACID, PLASMA - Abnormal; Notable for the following components:   Lactic Acid, Venous 2.0 (*)    All other components within normal limits  LIPASE, BLOOD  URINALYSIS, ROUTINE W REFLEX MICROSCOPIC  LACTIC ACID, PLASMA  CBG MONITORING, ED    EKG EKG Interpretation  Date/Time:  Saturday November 20 2018 20:52:27 EDT Ventricular Rate:  123 PR Interval:    QRS Duration: 90 QT Interval:  319 QTC Calculation: 457 R Axis:   79 Text Interpretation:  Sinus tachycardia Confirmed by Fredia Sorrow [phone]) on 11/20/2018 8:59:07 PM   Radiology Ct Abdomen Pelvis W Contrast  Result Date: 11/20/2018 CLINICAL DATA:  Nausea, vomiting and abdominal pain EXAM: CT ABDOMEN AND PELVIS WITH CONTRAST TECHNIQUE: Multidetector CT imaging of the abdomen and pelvis was performed using the standard protocol following bolus administration of intravenous contrast. CONTRAST:  115m OMNIPAQUE IOHEXOL 300 MG/ML  SOLN COMPARISON:  10/30/2017 FINDINGS: LOWER CHEST: There is no basilar pleural or apical pericardial effusion. HEPATOBILIARY: The hepatic contours and density are normal. There is no intra- or extrahepatic biliary dilatation. There is cholelithiasis without acute inflammation. PANCREAS: The pancreatic parenchymal contours are normal and there is no ductal dilatation. There is no peripancreatic fluid collection. SPLEEN: Normal. ADRENALS/URINARY TRACT: --Adrenal glands: Normal. --Right kidney/ureter: No hydronephrosis, nephroureterolithiasis, perinephric stranding or solid renal mass. --Left kidney/ureter: No hydronephrosis, nephroureterolithiasis, perinephric stranding or solid renal mass. --Urinary bladder: Normal for degree of distention STOMACH/BOWEL: --Stomach/Duodenum: There is no hiatal hernia or other gastric abnormality. The duodenal course and caliber are normal. --Small bowel: No dilatation or inflammation. --Colon: No focal abnormality. --Appendix: Normal.  VASCULAR/LYMPHATIC: Normal course and caliber of the major abdominal vessels. No abdominal or pelvic lymphadenopathy. REPRODUCTIVE: Normal prostate size with symmetric seminal vesicles. MUSCULOSKELETAL. No bony spinal canal stenosis or focal osseous abnormality. OTHER: None. IMPRESSION:  1. No acute abdominopelvic abnormality. 2. Cholelithiasis without acute inflammation. Electronically Signed   By: Ulyses Jarred M.D.   On: 11/20/2018 22:17    Procedures Procedures (including critical care time)  Medications Ordered in ED Medications  sodium chloride 0.9 % bolus 1,000 mL (1,000 mLs Intravenous New Bag/Given 11/20/18 2130)    And  0.9 %  sodium chloride infusion (has no administration in time range)  dextrose 5 %-0.45 % sodium chloride infusion (has no administration in time range)  insulin regular, human (MYXREDLIN) 100 units/ 100 mL infusion (has no administration in time range)  sodium chloride 0.9 % bolus 1,000 mL (has no administration in time range)    And  sodium chloride 0.9 % bolus 1,000 mL (has no administration in time range)    And  0.9 %  sodium chloride infusion (has no administration in time range)  HYDROmorphone (DILAUDID) injection 1 mg (1 mg Intravenous Given 11/20/18 2131)  iohexol (OMNIPAQUE) 300 MG/ML solution 100 mL (100 mLs Intravenous Contrast Given 11/20/18 2152)     Initial Impression / Assessment and Plan / ED Course  I have reviewed the triage vital signs and the nursing notes.  Pertinent labs & imaging results that were available during my care of the patient were reviewed by me and considered in my medical decision making (see chart for details).     Diabetic pt with tachycardia, vomiting abdominal pain and hx of medication non-compliance.  Initial exam concerning for DKA.  Will order labs and CT abd/pelvis.    chemistries show anion gap of 28 and glucose of 320.  Will order glucose stabilizer and IVF's    CRITICAL CARE Performed by: Hakim Minniefield Total  critical care time: 30 minutes Critical care time was exclusive of separately billable procedures and treating other patients. Critical care was necessary to treat or prevent imminent or life-threatening deterioration. Critical care was time spent personally by me on the following activities: development of treatment plan with patient and/or surrogate as well as nursing, discussions with consultants, evaluation of patient's response to treatment, examination of patient, obtaining history from patient or surrogate, ordering and performing treatments and interventions, ordering and review of laboratory studies, ordering and review of radiographic studies, pulse oximetry and re-evaluation of patient's condition.   CT abd/pelvis is negative for findings of acute surgical abdomen.  Pt has hx of known cholelithiasis.  Will require admission for DKA, also has elevated bili, so pt will likely need abdominal US in am for further evaluation.    On recheck, pt reports feeling feeling somewhat better, no further vomiting.  Will consult hospitalist.  Pt also seen by Dr. Rogene Houston   [phone]  [facility], Dr. Olevia Bowens,  Discussed findings and he agrees to admission.    Final Clinical Impressions(s) / ED Diagnoses   Final diagnoses:  Diabetic ketoacidosis without coma associated with type 2 diabetes mellitus [facility])    ED Discharge Orders    None       Kem Parkinson, PA-C 11/21/18 0027    Fredia Sorrow, MD 11/21/18 2352

## 2018-11-20 NOTE — ED Triage Notes (Signed)
Pt c/o abd pain, chest burning, vomiting, constipation for the past few days, last bowel movement was several days ago per pt.

## 2018-11-20 NOTE — ED Notes (Signed)
CRITICAL VALUE ALERT  Critical Value:  Lactic acid 2.0  Date & Time Notied:  11/20/18 2221  Provider Notified: [email or web address]  Orders Received/Actions taken: md notified

## 2018-11-20 NOTE — ED Provider Notes (Addendum)
Medical screening examination/treatment/procedure(s) were conducted as a shared visit with non-physician practitioner(s) and myself.  I personally evaluated the patient during the encounter.  EKG Interpretation  Date/Time:  Saturday November 20 2018 20:52:27 EDT Ventricular Rate:  123 PR Interval:    QRS Duration: 90 QT Interval:  319 QTC Calculation: 457 R Axis:   79 Text Interpretation:  Sinus tachycardia Confirmed by Fredia Sorrow [phone]) on 11/20/2018 8:59:07 PM   Results for orders placed or performed during the hospital encounter of 11/20/18  Comprehensive metabolic panel  Result Value Ref Range   Sodium 131 (L) 135 - 145 mmol/L   Potassium 4.4 3.5 - 5.1 mmol/L   Chloride 90 (L) 98 - 111 mmol/L   CO2 13 (L) 22 - 32 mmol/L   Glucose, Bld 321 (H) 70 - 99 mg/dL   BUN 36 (H) 6 - 20 mg/dL   Creatinine, Ser 1.19 0.61 - 1.24 mg/dL   Calcium 9.7 8.9 - 10.3 mg/dL   Total Protein 8.8 (H) 6.5 - 8.1 g/dL   Albumin 4.7 3.5 - 5.0 g/dL   AST 20 15 - 41 U/L   ALT 29 0 - 44 U/L   Alkaline Phosphatase 87 38 - 126 U/L   Total Bilirubin 2.4 (H) 0.3 - 1.2 mg/dL   GFR calc non Af Amer >60 >60 mL/min   GFR calc Af Amer >60 >60 mL/min   Anion gap 28 (H) 5 - 15  Lipase, blood  Result Value Ref Range   Lipase 24 11 - 51 U/L  CBC with Diff  Result Value Ref Range   WBC 15.6 (H) 4.0 - 10.5 K/uL   RBC 6.03 (H) 4.22 - 5.81 MIL/uL   Hemoglobin 18.3 (H) 13.0 - 17.0 g/dL   HCT 52.7 (H) 39.0 - 52.0 %   MCV 87.4 80.0 - 100.0 fL   MCH 30.3 26.0 - 34.0 pg   MCHC 34.7 30.0 - 36.0 g/dL   RDW 12.5 11.5 - 15.5 %   Platelets 435 (H) 150 - 400 K/uL   nRBC 0.0 0.0 - 0.2 %   Neutrophils Relative % 85 %   Neutro Abs 13.2 (H) 1.7 - 7.7 K/uL   Lymphocytes Relative 10 %   Lymphs Abs 1.6 0.7 - 4.0 K/uL   Monocytes Relative 4 %   Monocytes Absolute 0.6 0.1 - 1.0 K/uL   Eosinophils Relative 0 %   Eosinophils Absolute 0.0 0.0 - 0.5 K/uL   Basophils Relative 0 %   Basophils Absolute 0.1 0.0 - 0.1 K/uL    Immature Granulocytes 1 %   Abs Immature Granulocytes 0.12 (H) 0.00 - 0.07 K/uL  Lactic acid, plasma  Result Value Ref Range   Lactic Acid, Venous 2.0 (HH) 0.5 - 1.9 mmol/L  POC CBG, ED  Result Value Ref Range   Glucose-Capillary 262 (H) 70 - 99 mg/dL   Ct Abdomen Pelvis W Contrast  Result Date: 11/20/2018 CLINICAL DATA:  Nausea, vomiting and abdominal pain EXAM: CT ABDOMEN AND PELVIS WITH CONTRAST TECHNIQUE: Multidetector CT imaging of the abdomen and pelvis was performed using the standard protocol following bolus administration of intravenous contrast. CONTRAST:  134mL OMNIPAQUE IOHEXOL 300 MG/ML  SOLN COMPARISON:  10/30/2017 FINDINGS: LOWER CHEST: There is no basilar pleural or apical pericardial effusion. HEPATOBILIARY: The hepatic contours and density are normal. There is no intra- or extrahepatic biliary dilatation. There is cholelithiasis without acute inflammation. PANCREAS: The pancreatic parenchymal contours are normal and there is no ductal dilatation. There is no  peripancreatic fluid collection. SPLEEN: Normal. ADRENALS/URINARY TRACT: --Adrenal glands: Normal. --Right kidney/ureter: No hydronephrosis, nephroureterolithiasis, perinephric stranding or solid renal mass. --Left kidney/ureter: No hydronephrosis, nephroureterolithiasis, perinephric stranding or solid renal mass. --Urinary bladder: Normal for degree of distention STOMACH/BOWEL: --Stomach/Duodenum: There is no hiatal hernia or other gastric abnormality. The duodenal course and caliber are normal. --Small bowel: No dilatation or inflammation. --Colon: No focal abnormality. --Appendix: Normal. VASCULAR/LYMPHATIC: Normal course and caliber of the major abdominal vessels. No abdominal or pelvic lymphadenopathy. REPRODUCTIVE: Normal prostate size with symmetric seminal vesicles. MUSCULOSKELETAL. No bony spinal canal stenosis or focal osseous abnormality. OTHER: None. IMPRESSION: 1. No acute abdominopelvic abnormality. 2. Cholelithiasis  without acute inflammation. Electronically Signed   By: Ulyses Jarred M.D.   On: 11/20/2018 22:17    Patient seen by me along with the physician assistant.  Clinically presentation most likely consistent with DKA.  Blood sugar was in the mid 300s patient's been off his diabetic medicine for months.  He was told in the past that he should probably go on insulin as well.  Patient here without fever lactic acid was 2 sugars in the 3 300 range pseudohyponatremia with a sodium of 131 low concern of potassium was only 4.4.  Renal function was normal had a significant anion gap at 28.  Bilirubin isolated elevation of 2.4 which led to doing CT scan of the abdomen although he was complaining abdominal pain which probably related to DKA.  CT scan of the abdomen shows that he has cholelithiasis which the patient knew no evidence of any acute infection there.  Patient did have some mild discomfort in the epigastric area his abdomen is very large due to obesity.  CT scan showed no thickening of the gallbladder no biliary dilatation.  But always possible that perhaps there could be a prolonged biliary colic type presentation.  Ultrasound not available here tonight.  But something that could be done tomorrow to further evaluate the gallbladder.  Certainly does not appear here tonight to be any significant evidence of acute cholecystitis.  Liver function test are normal other than the bilirubin being elevated.  Lipase is normal.  There is a leukocytosis.  Had started her plan to start the glucose stabilizer drip but would just fluids blood sugar came down to 262 so we held the insulin drip and just can give 10 units of insulin IV.  CT scan of the abdomen being negative patient can be admitted here for DKA.  And blood sugar control.   Fredia Sorrow, MD 11/20/18 2250    Fredia Sorrow, MD 11/20/18 2253

## 2018-11-20 NOTE — H&P (Signed)
History and Physical    Kyle Collins FFM:[id number] DOB: 1986/06/14 DOA: 11/20/2018  PCP: Neale Burly, MD  Patient coming from: Home.  I have personally briefly reviewed patient's old medical records in [place]  Chief Complaint: Abdominal pain, vomiting, constipation and chest burning.  HPI: Kyle Collins is a 33 y.o. male with medical history significant of ADHD, anxiety, bipolar disorder, depression, type 2 diabetes, GERD, headache, hemochromatosis, hypertension, polycythemia, sleep apnea, hypothyroidism who is coming to the emergency department with complaints of progressively worse abdominal pain, associated with vomiting, chest burning and constipation for the past 3 days.  The pain is epigastric and related to nausea and emesis.  He denies diarrhea, constipation, melena or hematochezia.  He experienced some polyuria, polydipsia, polyphagia mild blurred vision.  No dysuria, frequency or hematuria.  He denies fever, chills, sore throat, rhinorrhea, wheezing, but has felt mildly dyspneic.  He has not being able to take his regular prescription medications.  ED Course: Initial vital signs temperature 98.7 F, pulse 125, respirations 14 and blood pressure 98/63 mmHg with an O2 sat of 96%.  The patient received fluid resuscitation and was started on an insulin infusion.  His urinalysis showed increase in a specific gravity, ketonuria of 80 glucosuria of more than 500 mg/dL.  White count was 15.6 with 85% neutrophils, hemoglobin 18.3 g/dL and platelets 435.  Lactic acid was 2.0 and 1.4 mmol/L.  Sodium was 131, potassium 4.4, chloride 90 and CO2 13 mmol/L with an initial anion gap of 28.  Total bilirubin 2.4, glucose 329, BUN 36, creatinine 1.19 and calcium 8.80 mg/dL.  Lipase, albumin and transaminases were normal.  Review of Systems: As per HPI otherwise 10 point review of systems negative.   Past Medical History:  Diagnosis Date  . ADHD (attention deficit hyperactivity disorder)    . Anxiety   . Bipolar disorder ([place])   . Depression   . Diabetes mellitus   . GERD (gastroesophageal reflux disease)   . Headache   . Hemochromatosis 2019  . Hypertension   . Polycythemia   . Sleep apnea   . Thyroid disease     Past Surgical History:  Procedure Laterality Date  . CIRCUMCISION N/A 09/14/2017   Procedure: CIRCUMCISION;  Surgeon: Cleon Gustin, MD;  Location: [facility];  Service: Urology;  Laterality: N/A;  1 HR 336 [phone] - He has a Education officer, museum that needs to be called if changes made Mateo [facility] @ [place] [facility]  . HERNIA REPAIR     scrotum  . MAXILLARY ANTROSTOMY Right 08/18/2018   Procedure: ENDOSCOPIC MAXILLARY RIGHT ANTROSTOMY WITH TISSUE REMOVAL;  Surgeon: Leta Baptist, MD;  Location: [facility];  Service: ENT;  Laterality: Right;  . NASAL SEPTOPLASTY W/ TURBINOPLASTY Bilateral 08/18/2018   Procedure: NASAL SEPTOPLASTY WITH TURBINATE REDUCTION;  Surgeon: Leta Baptist, MD;  Location: [facility];  Service: ENT;  Laterality: Bilateral;  . NASAL SEPTUM SURGERY  08/18/2018  . ORIF SHOULDER FRACTURE Right 02/28/2016   Procedure: OPEN REDUCTION INTERNAL FIXATION (ORIF) SHOULDER FRACTURE;  Surgeon: Carole Civil, MD;  Location: [facility];  Service: Orthopedics;  Laterality: Right;  . SHOULDER CLOSED REDUCTION Right 12/04/2015   Procedure: CLOSED REDUCTION SHOULDER;  Surgeon: Carole Civil, MD;  Location: [facility];  Service: Orthopedics;  Laterality: Right;  . TOOTH EXTRACTION  spring 2016   top left      reports that he quit smoking about 9 years ago. His smoking use included e-cigarettes. He has a  2.50 pack-year smoking history. He has never used smokeless tobacco. He reports current alcohol use. He reports that he does not use drugs.  Allergies  Allergen Reactions  . Methylphenidate Derivatives Other (See Comments)    Patient states it made him "act like a zombie" when given as a child    Family History  Problem Relation Age of Onset  .  Diabetes Mother   . Diabetes Father    Prior to Admission medications   Medication Sig Start Date End Date Taking? Authorizing Provider  amLODipine (NORVASC) 10 MG tablet Take 10 mg by mouth daily.     [provider]  amLODipine (NORVASC) 2.5 MG tablet Take 2.5 mg by mouth daily.    [provider]  blood glucose meter kit and supplies KIT Dispense based on patient and insurance preference. Use up to 2 times daily as directed. (FOR ICD-10 E11.65) 08/11/16   Cassandria Anger, MD  citalopram (CELEXA) 20 MG tablet Take 20 mg by mouth daily.     [provider]  empagliflozin (JARDIANCE) 10 MG TABS tablet Take 10 mg by mouth daily. 03/05/17   Cassandria Anger, MD  hydrochlorothiazide (HYDRODIURIL) 25 MG tablet TAKE ONE TABLET BY MOUTH DAILY. Patient taking differently: Take 25 mg by mouth daily.  06/02/17   Cassandria Anger, MD  lisinopril (PRINIVIL,ZESTRIL) 40 MG tablet Take 1 tablet (40 mg total) by mouth daily. 06/26/14   Milton Ferguson, MD  metFORMIN (GLUCOPHAGE-XR) 500 MG 24 hr tablet Take 1,000 mg by mouth 2 (two) times daily.     [provider]  oxyCODONE-acetaminophen (PERCOCET) 5-325 MG tablet Take 1-2 tablets by mouth every 4 (four) hours as needed for severe pain. 08/18/18   Leta Baptist, MD  traZODone (DESYREL) 150 MG tablet Take 75-150 mg by mouth at bedtime as needed for sleep.     [provider]    Physical Exam: Vitals:   11/20/18 2045 11/20/18 2053 11/20/18 2305  BP:  98/63 (!) 138/100  Pulse:  (!) 125 (!) 114  Resp:  14 (!) 30  Temp:  98.7 F (37.1 C) 98.6 F (37 C)  TempSrc:  Oral Oral  SpO2:  96% 98%  Weight: (!) 145.2 kg    Height: '5\' 11"'$  (1.803 m)      Constitutional: Looks acutely ill.  NAD, calm, comfortable Eyes: PERRL, lids and conjunctivae normal ENMT: Mucous membranes are mildly dry. Posterior pharynx clear of any exudate or lesions. Neck: normal, supple, no masses, no thyromegaly Respiratory: clear to  auscultation bilaterally, no wheezing, no crackles. Normal respiratory effort. No accessory muscle use.  Cardiovascular: Tachycardic with a heart rate in the 110s and a regular rhythm, no murmurs / rubs / gallops. No extremity edema. 2+ pedal pulses. No carotid bruits.  Abdomen: Morbidly obese, soft, no tenderness, no masses palpated. No hepatosplenomegaly. Bowel sounds positive.  Musculoskeletal: no clubbing / cyanosis.  Good ROM, no contractures. Normal muscle tone.  Skin: no rashes, lesions, ulcers on limited dermatological examination. Neurologic: CN 2-12 grossly intact. Sensation intact, DTR normal. Strength 5/5 in all 4.  Psychiatric: Normal judgment and insight. Alert and oriented x 3. Normal mood.   Labs on Admission: I have personally reviewed following labs and imaging studies  CBC: Recent Labs  Lab 11/20/18 2054  WBC 15.6*  NEUTROABS 13.2*  HGB 18.3*  HCT 52.7*  MCV 87.4  PLT 007*   Basic Metabolic Panel: Recent Labs  Lab 11/20/18 2054  NA 131*  K 4.4  CL 90*  CO2 13*  GLUCOSE 321*  BUN 36*  CREATININE 1.19  CALCIUM 9.7   GFR: Estimated Creatinine Clearance: 130.2 mL/min (by C-G formula based on SCr of 1.19 mg/dL). Liver Function Tests: Recent Labs  Lab 11/20/18 2054  AST 20  ALT 29  ALKPHOS 87  BILITOT 2.4*  PROT 8.8*  ALBUMIN 4.7   Recent Labs  Lab 11/20/18 2054  LIPASE 24   No results for input(s): AMMONIA in the last 168 hours. Coagulation Profile: No results for input(s): INR, PROTIME in the last 168 hours. Cardiac Enzymes: No results for input(s): CKTOTAL, CKMB, CKMBINDEX, TROPONINI in the last 168 hours. BNP (last 3 results) No results for input(s): PROBNP in the last 8760 hours. HbA1C: No results for input(s): HGBA1C in the last 72 hours. CBG: Recent Labs  Lab 11/20/18 2242 11/20/18 2352  GLUCAP 262* 187*   Lipid Profile: No results for input(s): CHOL, HDL, LDLCALC, TRIG, CHOLHDL, LDLDIRECT in the last 72 hours. Thyroid Function  Tests: No results for input(s): TSH, T4TOTAL, FREET4, T3FREE, THYROIDAB in the last 72 hours. Anemia Panel: No results for input(s): VITAMINB12, FOLATE, FERRITIN, TIBC, IRON, RETICCTPCT in the last 72 hours. Urine analysis:    Component Value Date/Time   COLORURINE STRAW (A) 11/20/2018 2310   APPEARANCEUR CLEAR 11/20/2018 2310   LABSPEC 1.039 (H) 11/20/2018 2310   PHURINE 5.0 11/20/2018 2310   GLUCOSEU >=500 (A) 11/20/2018 2310   HGBUR NEGATIVE 11/20/2018 2310   BILIRUBINUR NEGATIVE 11/20/2018 2310   KETONESUR 80 (A) 11/20/2018 2310   PROTEINUR NEGATIVE 11/20/2018 2310   UROBILINOGEN 0.2 02/18/2011 0020   NITRITE NEGATIVE 11/20/2018 2310   LEUKOCYTESUR NEGATIVE 11/20/2018 2310    Radiological Exams on Admission: Ct Abdomen Pelvis W Contrast  Result Date: 11/20/2018 CLINICAL DATA:  Nausea, vomiting and abdominal pain EXAM: CT ABDOMEN AND PELVIS WITH CONTRAST TECHNIQUE: Multidetector CT imaging of the abdomen and pelvis was performed using the standard protocol following bolus administration of intravenous contrast. CONTRAST:  154m OMNIPAQUE IOHEXOL 300 MG/ML  SOLN COMPARISON:  10/30/2017 FINDINGS: LOWER CHEST: There is no basilar pleural or apical pericardial effusion. HEPATOBILIARY: The hepatic contours and density are normal. There is no intra- or extrahepatic biliary dilatation. There is cholelithiasis without acute inflammation. PANCREAS: The pancreatic parenchymal contours are normal and there is no ductal dilatation. There is no peripancreatic fluid collection. SPLEEN: Normal. ADRENALS/URINARY TRACT: --Adrenal glands: Normal. --Right kidney/ureter: No hydronephrosis, nephroureterolithiasis, perinephric stranding or solid renal mass. --Left kidney/ureter: No hydronephrosis, nephroureterolithiasis, perinephric stranding or solid renal mass. --Urinary bladder: Normal for degree of distention STOMACH/BOWEL: --Stomach/Duodenum: There is no hiatal hernia or other gastric abnormality. The  duodenal course and caliber are normal. --Small bowel: No dilatation or inflammation. --Colon: No focal abnormality. --Appendix: Normal. VASCULAR/LYMPHATIC: Normal course and caliber of the major abdominal vessels. No abdominal or pelvic lymphadenopathy. REPRODUCTIVE: Normal prostate size with symmetric seminal vesicles. MUSCULOSKELETAL. No bony spinal canal stenosis or focal osseous abnormality. OTHER: None. IMPRESSION: 1. No acute abdominopelvic abnormality. 2. Cholelithiasis without acute inflammation. Electronically Signed   By: KUlyses JarredM.D.   On: 11/20/2018 22:17    EKG: Independently reviewed.  Vent. rate 123 BPM PR interval * ms QRS duration 90 ms QT/QTc 319/457 ms P-R-T axes 71 79 32 Sinus tachycardia  Assessment/Plan Principal Problem:   DKA (diabetic ketoacidosis) ([place]  Admit to stepdown/inpatient. Keep n.p.o. Continue IV fluids. Continue insulin infusion. Monitor CBG hourly. Correct electrolytes as needed. Told the patient that most likely he will need Bleiler-acting  insulin therapy.  Active Problems:   Essential hypertension, benign Has not taken his medications in several days due to vomiting. Resume amlodipine 12.5 mg and lisinopril 40 mg p.o. daily once cleared for oral intake. Monitor blood pressure, renal function and electrolytes.    Sleep apnea Has not used CPAP recently due to sinus surgery. Declined to use 1 of our CPAP devices while in the hospital.    GERD (gastroesophageal reflux disease) Famotidine 20 mg IVP x1.    Polycythemia Secondary to hemochromatosis and hemoconcentration. Continue IV hydration. Follow-up H&H.    Hyperbilirubinemia Follow-up bilirubin level.    Cholelithiasis Ultrasound showed cholelithiasis of the ago. HIDA scan showed possible chronic cholecystitis. The patient seems to be asymptomatic from this at this time. Recommended to follow-up with PCP/general surgery as an outpatient   DVT prophylaxis: SCDs. Code Status:  Full code. Family Communication: Disposition Plan: Observation for IV hydration and DKA treatment. Consults called: Admission status: Observation/stepdown.   Reubin Milan MD [facility]  11/20/2018, 11:55 PM   This document was prepared using Dragon voice recognition software and may contain some unintended transcription errors.

## 2018-11-20 NOTE — ED Notes (Signed)
Pt given mouth swab.

## 2018-11-21 ENCOUNTER — Encounter (HOSPITAL_COMMUNITY): Payer: Self-pay | Admitting: General Practice

## 2018-11-21 DIAGNOSIS — K8010 Calculus of gallbladder with chronic cholecystitis without obstruction: Secondary | ICD-10-CM | POA: Diagnosis not present

## 2018-11-21 DIAGNOSIS — Z6841 Body Mass Index (BMI) 40.0 and over, adult: Secondary | ICD-10-CM | POA: Diagnosis not present

## 2018-11-21 DIAGNOSIS — E039 Hypothyroidism, unspecified: Secondary | ICD-10-CM | POA: Diagnosis not present

## 2018-11-21 DIAGNOSIS — K219 Gastro-esophageal reflux disease without esophagitis: Secondary | ICD-10-CM | POA: Diagnosis not present

## 2018-11-21 DIAGNOSIS — I10 Essential (primary) hypertension: Secondary | ICD-10-CM | POA: Diagnosis not present

## 2018-11-21 DIAGNOSIS — K8000 Calculus of gallbladder with acute cholecystitis without obstruction: Secondary | ICD-10-CM | POA: Diagnosis not present

## 2018-11-21 DIAGNOSIS — Z23 Encounter for immunization: Secondary | ICD-10-CM | POA: Diagnosis not present

## 2018-11-21 DIAGNOSIS — G4730 Sleep apnea, unspecified: Secondary | ICD-10-CM | POA: Diagnosis not present

## 2018-11-21 DIAGNOSIS — F319 Bipolar disorder, unspecified: Secondary | ICD-10-CM | POA: Diagnosis not present

## 2018-11-21 DIAGNOSIS — E1110 Type 2 diabetes mellitus with ketoacidosis without coma: Secondary | ICD-10-CM | POA: Diagnosis not present

## 2018-11-21 DIAGNOSIS — F909 Attention-deficit hyperactivity disorder, unspecified type: Secondary | ICD-10-CM | POA: Diagnosis not present

## 2018-11-21 LAB — RENAL FUNCTION PANEL
Albumin: 4 g/dL (ref 3.5–5.0)
Anion gap: 15 (ref 5–15)
BUN: 18 mg/dL (ref 6–20)
CO2: 20 mmol/L — ABNORMAL LOW (ref 22–32)
Calcium: 8.9 mg/dL (ref 8.9–10.3)
Chloride: 97 mmol/L — ABNORMAL LOW (ref 98–111)
Creatinine, Ser: 0.78 mg/dL (ref 0.61–1.24)
GFR calc Af Amer: >60 mL/min (ref >60)
GFR calc non Af Amer: >60 mL/min (ref >60)
Glucose, Bld: 207 mg/dL — ABNORMAL HIGH (ref 70–99)
Phosphorus: 1.5 mg/dL — ABNORMAL LOW (ref 2.5–4.6)
Potassium: 4.2 mmol/L (ref 3.5–5.1)
Sodium: 132 mmol/L — ABNORMAL LOW (ref 135–145)

## 2018-11-21 LAB — BASIC METABOLIC PANEL
Anion gap: 23 — ABNORMAL HIGH (ref 5–15)
Anion gap: 16 — ABNORMAL HIGH (ref 5–15)
Anion gap: 14 (ref 5–15)
Anion gap: 13 (ref 5–15)
BUN: 31 mg/dL — ABNORMAL HIGH (ref 6–20)
BUN: 21 mg/dL — ABNORMAL HIGH (ref 6–20)
BUN: 20 mg/dL (ref 6–20)
BUN: 17 mg/dL (ref 6–20)
CO2: 14 mmol/L — ABNORMAL LOW (ref 22–32)
CO2: 18 mmol/L — ABNORMAL LOW (ref 22–32)
CO2: 19 mmol/L — ABNORMAL LOW (ref 22–32)
CO2: 23 mmol/L (ref 22–32)
Calcium: 8.9 mg/dL (ref 8.9–10.3)
Calcium: 8.6 mg/dL — ABNORMAL LOW (ref 8.9–10.3)
Calcium: 8.9 mg/dL (ref 8.9–10.3)
Calcium: 9.1 mg/dL (ref 8.9–10.3)
Chloride: 95 mmol/L — ABNORMAL LOW (ref 98–111)
Chloride: 97 mmol/L — ABNORMAL LOW (ref 98–111)
Chloride: 97 mmol/L — ABNORMAL LOW (ref 98–111)
Chloride: 97 mmol/L — ABNORMAL LOW (ref 98–111)
Creatinine, Ser: 1.1 mg/dL (ref 0.61–1.24)
Creatinine, Ser: 0.82 mg/dL (ref 0.61–1.24)
Creatinine, Ser: 0.8 mg/dL (ref 0.61–1.24)
Creatinine, Ser: 0.79 mg/dL (ref 0.61–1.24)
GFR calc Af Amer: >60 mL/min (ref >60)
GFR calc Af Amer: >60 mL/min (ref >60)
GFR calc Af Amer: >60 mL/min (ref >60)
GFR calc Af Amer: >60 mL/min (ref >60)
GFR calc non Af Amer: >60 mL/min (ref >60)
GFR calc non Af Amer: >60 mL/min (ref >60)
GFR calc non Af Amer: >60 mL/min (ref >60)
GFR calc non Af Amer: >60 mL/min (ref >60)
Glucose, Bld: 213 mg/dL — ABNORMAL HIGH (ref 70–99)
Glucose, Bld: 210 mg/dL — ABNORMAL HIGH (ref 70–99)
Glucose, Bld: 219 mg/dL — ABNORMAL HIGH (ref 70–99)
Glucose, Bld: 209 mg/dL — ABNORMAL HIGH (ref 70–99)
Potassium: 4.3 mmol/L (ref 3.5–5.1)
Potassium: 4.2 mmol/L (ref 3.5–5.1)
Potassium: 3.8 mmol/L (ref 3.5–5.1)
Potassium: 4.5 mmol/L (ref 3.5–5.1)
Sodium: 132 mmol/L — ABNORMAL LOW (ref 135–145)
Sodium: 131 mmol/L — ABNORMAL LOW (ref 135–145)
Sodium: 130 mmol/L — ABNORMAL LOW (ref 135–145)
Sodium: 133 mmol/L — ABNORMAL LOW (ref 135–145)

## 2018-11-21 LAB — CBC
HCT: 46 % (ref 39.0–52.0)
Hemoglobin: 15 g/dL (ref 13.0–17.0)
MCH: 29.6 pg (ref 26.0–34.0)
MCHC: 32.6 g/dL (ref 30.0–36.0)
MCV: 90.9 fL (ref 80.0–100.0)
Platelets: 357 10*3/uL (ref 150–400)
RBC: 5.06 MIL/uL (ref 4.22–5.81)
RDW: 12.3 % (ref 11.5–15.5)
WBC: 11.3 10*3/uL — ABNORMAL HIGH (ref 4.0–10.5)
nRBC: 0 % (ref 0.0–0.2)

## 2018-11-21 LAB — COMPREHENSIVE METABOLIC PANEL
ALT: 22 U/L (ref 0–44)
AST: 16 U/L (ref 15–41)
Albumin: 3.8 g/dL (ref 3.5–5.0)
Alkaline Phosphatase: 68 U/L (ref 38–126)
Anion gap: 15 (ref 5–15)
BUN: 21 mg/dL — ABNORMAL HIGH (ref 6–20)
CO2: 17 mmol/L — ABNORMAL LOW (ref 22–32)
Calcium: 8.2 mg/dL — ABNORMAL LOW (ref 8.9–10.3)
Chloride: 101 mmol/L (ref 98–111)
Creatinine, Ser: 0.75 mg/dL (ref 0.61–1.24)
GFR calc Af Amer: >60 mL/min (ref >60)
GFR calc non Af Amer: >60 mL/min (ref >60)
Glucose, Bld: 155 mg/dL — ABNORMAL HIGH (ref 70–99)
Potassium: 4 mmol/L (ref 3.5–5.1)
Sodium: 133 mmol/L — ABNORMAL LOW (ref 135–145)
Total Bilirubin: 1.7 mg/dL — ABNORMAL HIGH (ref 0.3–1.2)
Total Protein: 7.5 g/dL (ref 6.5–8.1)

## 2018-11-21 LAB — TROPONIN I
Troponin I: <0.03 ng/mL (ref <0.03)
Troponin I: <0.03 ng/mL (ref <0.03)

## 2018-11-21 LAB — GLUCOSE, CAPILLARY
Glucose-Capillary: 145 mg/dL — ABNORMAL HIGH (ref 70–99)
Glucose-Capillary: 146 mg/dL — ABNORMAL HIGH (ref 70–99)
Glucose-Capillary: 154 mg/dL — ABNORMAL HIGH (ref 70–99)
Glucose-Capillary: 157 mg/dL — ABNORMAL HIGH (ref 70–99)
Glucose-Capillary: 163 mg/dL — ABNORMAL HIGH (ref 70–99)
Glucose-Capillary: 166 mg/dL — ABNORMAL HIGH (ref 70–99)
Glucose-Capillary: 198 mg/dL — ABNORMAL HIGH (ref 70–99)
Glucose-Capillary: 199 mg/dL — ABNORMAL HIGH (ref 70–99)
Glucose-Capillary: 233 mg/dL — ABNORMAL HIGH (ref 70–99)
Glucose-Capillary: 249 mg/dL — ABNORMAL HIGH (ref 70–99)

## 2018-11-21 LAB — MRSA PCR SCREENING: MRSA by PCR: NEGATIVE

## 2018-11-21 LAB — MAGNESIUM: Magnesium: 2.3 mg/dL (ref 1.7–2.4)

## 2018-11-21 LAB — PHOSPHORUS: Phosphorus: 2.9 mg/dL (ref 2.5–4.6)

## 2018-11-21 MED ORDER — HYDROMORPHONE HCL 1 MG/ML IJ SOLN
1.0000 mg | Freq: Once | INTRAMUSCULAR | Status: AC
  Administered 2018-11-21: 1 mg via INTRAVENOUS
  Filled 2018-11-21: qty 1

## 2018-11-21 MED ORDER — INSULIN ASPART 100 UNIT/ML ~~LOC~~ SOLN
0.0000 [IU] | Freq: Three times a day (TID) | SUBCUTANEOUS | Status: DC
  Administered 2018-11-22 – 2018-11-23 (×5): 3 [IU] via SUBCUTANEOUS
  Administered 2018-11-23: 2 [IU] via SUBCUTANEOUS
  Administered 2018-11-24: 8 [IU] via SUBCUTANEOUS

## 2018-11-21 MED ORDER — LISINOPRIL 10 MG PO TABS
40.0000 mg | ORAL_TABLET | Freq: Every day | ORAL | Status: DC
  Administered 2018-11-21 – 2018-11-24 (×4): 40 mg via ORAL
  Filled 2018-11-21 (×4): qty 4

## 2018-11-21 MED ORDER — PNEUMOCOCCAL VAC POLYVALENT 25 MCG/0.5ML IJ INJ
0.5000 mL | INJECTION | INTRAMUSCULAR | Status: AC
  Administered 2018-11-22: 0.5 mL via INTRAMUSCULAR
  Filled 2018-11-21: qty 0.5

## 2018-11-21 MED ORDER — IPRATROPIUM-ALBUTEROL 0.5-2.5 (3) MG/3ML IN SOLN: 3.0000 mL | Freq: Four times a day (QID) | RESPIRATORY_TRACT | Status: DC | PRN

## 2018-11-21 MED ORDER — INSULIN ASPART 100 UNIT/ML ~~LOC~~ SOLN: 0.0000 [IU] | Freq: Every day | SUBCUTANEOUS | Status: DC

## 2018-11-21 MED ORDER — INSULIN ASPART 100 UNIT/ML ~~LOC~~ SOLN: 0.0000 [IU] | Freq: Three times a day (TID) | SUBCUTANEOUS | Status: DC

## 2018-11-21 MED ORDER — FAMOTIDINE IN NACL 20-0.9 MG/50ML-% IV SOLN
20.0000 mg | Freq: Once | INTRAVENOUS | Status: AC
  Administered 2018-11-21: 20 mg via INTRAVENOUS
  Filled 2018-11-21: qty 50

## 2018-11-21 MED ORDER — OXYCODONE-ACETAMINOPHEN 5-325 MG PO TABS
1.0000 | ORAL_TABLET | Freq: Once | ORAL | Status: AC
  Administered 2018-11-21: 1 via ORAL
  Filled 2018-11-21: qty 1

## 2018-11-21 MED ORDER — LIDOCAINE VISCOUS HCL 2 % MT SOLN
15.0000 mL | Freq: Once | OROMUCOSAL | Status: AC
  Administered 2018-11-21: 06:00:00 15 mL via ORAL
  Filled 2018-11-21: qty 15

## 2018-11-21 MED ORDER — AMLODIPINE BESYLATE 5 MG PO TABS
10.0000 mg | ORAL_TABLET | Freq: Every day | ORAL | Status: DC
  Administered 2018-11-21 – 2018-11-24 (×4): 10 mg via ORAL
  Filled 2018-11-21 (×4): qty 2

## 2018-11-21 MED ORDER — ALUM & MAG HYDROXIDE-SIMETH 200-200-20 MG/5ML PO SUSP
30.0000 mL | ORAL | Status: DC | PRN
  Administered 2018-11-21 – 2018-11-22 (×3): 30 mL via ORAL
  Filled 2018-11-21 (×3): qty 30

## 2018-11-21 MED ORDER — INSULIN GLARGINE 100 UNIT/ML ~~LOC~~ SOLN
5.0000 [IU] | Freq: Once | SUBCUTANEOUS | Status: AC
  Administered 2018-11-21: 5 [IU] via SUBCUTANEOUS
  Filled 2018-11-21: qty 0.05

## 2018-11-21 MED ORDER — SUCRALFATE 1 GM/10ML PO SUSP
1.0000 g | Freq: Once | ORAL | Status: AC
  Administered 2018-11-21: 1 g via ORAL
  Filled 2018-11-21: qty 10

## 2018-11-21 NOTE — Progress Notes (Signed)
Pt with complaints of recurring chest pain 5/5 with pt stating he was trying to sleep and the air mattress on his bed is aggravating.  Dr Lavell Islam notified.  MD would like EGD.  Verbal order placed.  MD asking for clarification on why labs have not been collected.  MD informed I just found out by nurse standing next to me, our lab phlebotomist had to go home sick.  MD informed I would collect lab orders.

## 2018-11-21 NOTE — Progress Notes (Signed)
PROGRESS NOTE    Kyle Collins  OIZ:[id number] DOB: [birth year] DOA: 11/20/2018 PCP: Kyle Burly, MD  Outpatient Specialists:  Brief Narrative:  As per H and P done earlier " Kyle Collins is a 33 y.o. male with medical history significant of ADHD, anxiety, bipolar disorder, depression, type 2 diabetes, GERD, headache, hemochromatosis, hypertension, polycythemia, sleep apnea, hypothyroidism who is coming to the emergency department with complaints of progressively worse abdominal pain, associated with vomiting, chest burning and constipation for the past 3 days.  The pain is epigastric and related to nausea and emesis.  He denies diarrhea, constipation, melena or hematochezia.  He experienced some polyuria, polydipsia, polyphagia mild blurred vision.  No dysuria, frequency or hematuria.  He denies fever, chills, sore throat, rhinorrhea, wheezing, but has felt mildly dyspneic.  He has not being able to take his regular prescription medications".  11/21/2018: Patient seen.  DKA has resolved.  Role of empagliflozin in ketoacidosis.  Continue IV hydration.  Change insulin regimen to subcutaneous Lantus and SSI coverage.   Assessment & Plan:   Principal Problem:   DKA (diabetic ketoacidosis) ([place]) Active Problems:   Essential hypertension, benign   Sleep apnea   GERD (gastroesophageal reflux disease)   Polycythemia   Hyperbilirubinemia   Cholelithiasis  DKA (diabetic ketoacidosis) ([place])  Admit to stepdown/inpatient. Keep n.p.o. Continue IV fluids. Continue insulin infusion. Monitor CBG hourly. Correct electrolytes as needed. Told the patient that most likely he will need Gerke-acting insulin therapy. 11/21/2018: DKA has resolved.  Continue with hydration.  Start subcutaneous Lantus.  Start sliding scale insulin coverage.  Consult diabetic Brewing technologist.  Further management will depend on above.    Essential hypertension, benign Has not taken his medications in several days due to  vomiting. Resume amlodipine 12.5 mg and lisinopril 40 mg p.o. daily once cleared for oral intake. Monitor blood pressure, renal function and electrolytes. 11/21/2018: Resume patient's antihypertensives.    Sleep apnea Has not used CPAP recently due to sinus surgery. Declined to use 1 of our CPAP devices while in the hospital. 11/21/2018: Encourage use of CPAP.    Polycythemia Secondary to hemochromatosis and hemoconcentration. Continue IV hydration. Follow-up H&H.    Cholelithiasis Ultrasound showed cholelithiasis a year ago. HIDA scan showed possible chronic cholecystitis. The patient seems to be asymptomatic from this at this time. Recommended to follow-up with PCP/general surgery as an outpatient   DVT prophylaxis: SCDs. Code Status: Full code. Family Communication: Disposition Plan: Home.   Consultants:   Diabetic Brewing technologist.    Procedures:   None  Antimicrobials:   None   Subjective: No new complaints Nausea and vomiting have resolved.   Abdominal pain has resolved  Objective: Vitals:   11/21/18 0300 11/21/18 0400 11/21/18 0500 11/21/18 1053  BP:  122/64    Pulse: (!) 106 87 81 96  Resp: 11 17 14 16   Temp:    98.9 F (37.2 C)  TempSrc:    Oral  SpO2: 100% 93% 95% 96%  Weight:      Height:        Intake/Output Summary (Last 24 hours) at 11/21/2018 1130 Last data filed at 11/21/2018 0651 Gross per 24 hour  Intake 4676.1 ml  Output --  Net 4676.1 ml   Filed Weights   11/20/18 2045 11/21/18 0102  Weight: (!) 145.2 kg (!) 140 kg    Examination:  General exam: Appears calm and comfortable.  Patient is morbidly obese. Respiratory system: Adequate air entry.  Mild inspiratory  expiratory wheeze right lung field. Cardiovascular system: S1 & S2 heard. Gastrointestinal system: Abdomen is morbidly obese, soft and nontender.  Organs are difficult to assess.   Central nervous system: Alert and oriented.  Patient moves all extremities.  Data  Reviewed: I have personally reviewed following labs and imaging studies  CBC: Recent Labs  Lab 11/20/18 2054 11/21/18 0557  WBC 15.6* 11.3*  NEUTROABS 13.2*  --   HGB 18.3* 15.0  HCT 52.7* 46.0  MCV 87.4 90.9  PLT 435* 161   Basic Metabolic Panel: Recent Labs  Lab 11/20/18 2054 11/20/18 2328 11/21/18 0557 11/21/18 0948  NA 131* 132* 133* 131*  K 4.4 4.3 4.0 4.2  CL 90* 95* 101 97*  CO2 13* 14* 17* 18*  GLUCOSE 321* 213* 155* 210*  BUN 36* 31* 21* 21*  CREATININE 1.19 1.10 0.75 0.82  CALCIUM 9.7 8.9 8.2* 8.6*  MG  --  2.3  --   --   PHOS  --  2.9  --   --    GFR: Estimated Creatinine Clearance: 185.1 mL/min (by C-G formula based on SCr of 0.82 mg/dL). Liver Function Tests: Recent Labs  Lab 11/20/18 2054 11/21/18 0557  AST 20 16  ALT 29 22  ALKPHOS 87 68  BILITOT 2.4* 1.7*  PROT 8.8* 7.5  ALBUMIN 4.7 3.8   Recent Labs  Lab 11/20/18 2054  LIPASE 24   No results for input(s): AMMONIA in the last 168 hours. Coagulation Profile: No results for input(s): INR, PROTIME in the last 168 hours. Cardiac Enzymes: Recent Labs  Lab 11/21/18 0557  TROPONINI <0.03   BNP (last 3 results) No results for input(s): PROBNP in the last 8760 hours. HbA1C: No results for input(s): HGBA1C in the last 72 hours. CBG: Recent Labs  Lab 11/21/18 0303 11/21/18 0412 11/21/18 0520 11/21/18 0714 11/21/18 1055  GLUCAP 163* 146* 157* 145* 249*   Lipid Profile: No results for input(s): CHOL, HDL, LDLCALC, TRIG, CHOLHDL, LDLDIRECT in the last 72 hours. Thyroid Function Tests: No results for input(s): TSH, T4TOTAL, FREET4, T3FREE, THYROIDAB in the last 72 hours. Anemia Panel: No results for input(s): VITAMINB12, FOLATE, FERRITIN, TIBC, IRON, RETICCTPCT in the last 72 hours. Urine analysis:    Component Value Date/Time   COLORURINE STRAW (A) 11/20/2018 2310   APPEARANCEUR CLEAR 11/20/2018 2310   LABSPEC 1.039 (H) 11/20/2018 2310   PHURINE 5.0 11/20/2018 2310   GLUCOSEU  >=500 (A) 11/20/2018 2310   HGBUR NEGATIVE 11/20/2018 2310   BILIRUBINUR NEGATIVE 11/20/2018 2310   KETONESUR 80 (A) 11/20/2018 2310   PROTEINUR NEGATIVE 11/20/2018 2310   UROBILINOGEN 0.2 02/18/2011 0020   NITRITE NEGATIVE 11/20/2018 2310   LEUKOCYTESUR NEGATIVE 11/20/2018 2310   Sepsis Labs: @LABRCNTIP (procalcitonin:4,lacticidven:4)  ) Recent Results (from the past 240 hour(s))  MRSA PCR Screening     Status: None   Collection Time: 11/21/18 12:34 AM  Result Value Ref Range Status   MRSA by PCR NEGATIVE NEGATIVE Final    Comment:        The GeneXpert MRSA Assay (FDA approved for NASAL specimens only), is one component of a comprehensive MRSA colonization surveillance program. It is not intended to diagnose MRSA infection nor to guide or monitor treatment for MRSA infections. Performed at [facility], [address] [place], [place] [address]          Radiology Studies: Ct Abdomen Pelvis W Contrast  Result Date: 11/20/2018 CLINICAL DATA:  Nausea, vomiting and abdominal pain EXAM: CT ABDOMEN AND PELVIS  WITH CONTRAST TECHNIQUE: Multidetector CT imaging of the abdomen and pelvis was performed using the standard protocol following bolus administration of intravenous contrast. CONTRAST:  147mL OMNIPAQUE IOHEXOL 300 MG/ML  SOLN COMPARISON:  10/30/2017 FINDINGS: LOWER CHEST: There is no basilar pleural or apical pericardial effusion. HEPATOBILIARY: The hepatic contours and density are normal. There is no intra- or extrahepatic biliary dilatation. There is cholelithiasis without acute inflammation. PANCREAS: The pancreatic parenchymal contours are normal and there is no ductal dilatation. There is no peripancreatic fluid collection. SPLEEN: Normal. ADRENALS/URINARY TRACT: --Adrenal glands: Normal. --Right kidney/ureter: No hydronephrosis, nephroureterolithiasis, perinephric stranding or solid renal mass. --Left kidney/ureter: No hydronephrosis, nephroureterolithiasis, perinephric  stranding or solid renal mass. --Urinary bladder: Normal for degree of distention STOMACH/BOWEL: --Stomach/Duodenum: There is no hiatal hernia or other gastric abnormality. The duodenal course and caliber are normal. --Small bowel: No dilatation or inflammation. --Colon: No focal abnormality. --Appendix: Normal. VASCULAR/LYMPHATIC: Normal course and caliber of the major abdominal vessels. No abdominal or pelvic lymphadenopathy. REPRODUCTIVE: Normal prostate size with symmetric seminal vesicles. MUSCULOSKELETAL. No bony spinal canal stenosis or focal osseous abnormality. OTHER: None. IMPRESSION: 1. No acute abdominopelvic abnormality. 2. Cholelithiasis without acute inflammation. Electronically Signed   By: Ulyses Jarred M.D.   On: 11/20/2018 22:17        Scheduled Meds:  [START ON 11/22/2018] pneumococcal 23 valent vaccine  0.5 mL Intramuscular Tomorrow-1000   Continuous Infusions:  sodium chloride     sodium chloride 125 mL/hr at 11/21/18 0651   dextrose 5 % and 0.45% NaCl 125 mL/hr at 11/21/18 0819     LOS: 0 days    Dana Allan, MD  [facility] Pager #: [phone] 7PM-7AM contact night coverage as above

## 2018-11-21 NOTE — Progress Notes (Signed)
Called report to Quillian Quince on 300

## 2018-11-22 ENCOUNTER — Observation Stay (HOSPITAL_COMMUNITY): Payer: Medicare Other

## 2018-11-22 DIAGNOSIS — K8020 Calculus of gallbladder without cholecystitis without obstruction: Secondary | ICD-10-CM | POA: Diagnosis not present

## 2018-11-22 DIAGNOSIS — K2970 Gastritis, unspecified, without bleeding: Secondary | ICD-10-CM | POA: Diagnosis present

## 2018-11-22 DIAGNOSIS — K760 Fatty (change of) liver, not elsewhere classified: Secondary | ICD-10-CM | POA: Diagnosis not present

## 2018-11-22 DIAGNOSIS — K2940 Chronic atrophic gastritis without bleeding: Secondary | ICD-10-CM | POA: Diagnosis not present

## 2018-11-22 DIAGNOSIS — G4730 Sleep apnea, unspecified: Secondary | ICD-10-CM | POA: Diagnosis present

## 2018-11-22 DIAGNOSIS — Z79899 Other long term (current) drug therapy: Secondary | ICD-10-CM | POA: Diagnosis not present

## 2018-11-22 DIAGNOSIS — K8000 Calculus of gallbladder with acute cholecystitis without obstruction: Secondary | ICD-10-CM | POA: Diagnosis present

## 2018-11-22 DIAGNOSIS — Z87891 Personal history of nicotine dependence: Secondary | ICD-10-CM | POA: Diagnosis not present

## 2018-11-22 DIAGNOSIS — K219 Gastro-esophageal reflux disease without esophagitis: Secondary | ICD-10-CM | POA: Diagnosis present

## 2018-11-22 DIAGNOSIS — E1110 Type 2 diabetes mellitus with ketoacidosis without coma: Secondary | ICD-10-CM | POA: Diagnosis present

## 2018-11-22 DIAGNOSIS — I10 Essential (primary) hypertension: Secondary | ICD-10-CM

## 2018-11-22 DIAGNOSIS — Z9114 Patient's other noncompliance with medication regimen: Secondary | ICD-10-CM | POA: Diagnosis not present

## 2018-11-22 DIAGNOSIS — K8010 Calculus of gallbladder with chronic cholecystitis without obstruction: Secondary | ICD-10-CM | POA: Diagnosis present

## 2018-11-22 DIAGNOSIS — E11649 Type 2 diabetes mellitus with hypoglycemia without coma: Secondary | ICD-10-CM | POA: Diagnosis present

## 2018-11-22 DIAGNOSIS — F319 Bipolar disorder, unspecified: Secondary | ICD-10-CM | POA: Diagnosis present

## 2018-11-22 DIAGNOSIS — D751 Secondary polycythemia: Secondary | ICD-10-CM | POA: Diagnosis present

## 2018-11-22 DIAGNOSIS — Z7984 Long term (current) use of oral hypoglycemic drugs: Secondary | ICD-10-CM | POA: Diagnosis not present

## 2018-11-22 DIAGNOSIS — Z23 Encounter for immunization: Secondary | ICD-10-CM | POA: Diagnosis not present

## 2018-11-22 DIAGNOSIS — E039 Hypothyroidism, unspecified: Secondary | ICD-10-CM | POA: Diagnosis present

## 2018-11-22 DIAGNOSIS — R1011 Right upper quadrant pain: Secondary | ICD-10-CM | POA: Diagnosis not present

## 2018-11-22 DIAGNOSIS — F909 Attention-deficit hyperactivity disorder, unspecified type: Secondary | ICD-10-CM | POA: Diagnosis present

## 2018-11-22 DIAGNOSIS — Z79891 Long term (current) use of opiate analgesic: Secondary | ICD-10-CM | POA: Diagnosis not present

## 2018-11-22 DIAGNOSIS — Z833 Family history of diabetes mellitus: Secondary | ICD-10-CM | POA: Diagnosis not present

## 2018-11-22 DIAGNOSIS — K5900 Constipation, unspecified: Secondary | ICD-10-CM | POA: Diagnosis present

## 2018-11-22 DIAGNOSIS — Z6841 Body Mass Index (BMI) 40.0 and over, adult: Secondary | ICD-10-CM | POA: Diagnosis not present

## 2018-11-22 LAB — BASIC METABOLIC PANEL
Anion gap: 15 (ref 5–15)
BUN: 12 mg/dL (ref 6–20)
CO2: 20 mmol/L — ABNORMAL LOW (ref 22–32)
Calcium: 9.1 mg/dL (ref 8.9–10.3)
Chloride: 100 mmol/L (ref 98–111)
Creatinine, Ser: 0.69 mg/dL (ref 0.61–1.24)
GFR calc Af Amer: >60 mL/min (ref >60)
GFR calc non Af Amer: >60 mL/min (ref >60)
Glucose, Bld: 165 mg/dL — ABNORMAL HIGH (ref 70–99)
Potassium: 4 mmol/L (ref 3.5–5.1)
Sodium: 135 mmol/L (ref 135–145)

## 2018-11-22 LAB — HIV ANTIBODY (ROUTINE TESTING W REFLEX): HIV Screen 4th Generation wRfx: NONREACTIVE

## 2018-11-22 LAB — GLUCOSE, CAPILLARY
Glucose-Capillary: 179 mg/dL — ABNORMAL HIGH (ref 70–99)
Glucose-Capillary: 195 mg/dL — ABNORMAL HIGH (ref 70–99)
Glucose-Capillary: 151 mg/dL — ABNORMAL HIGH (ref 70–99)
Glucose-Capillary: 146 mg/dL — ABNORMAL HIGH (ref 70–99)

## 2018-11-22 LAB — HEMOGLOBIN A1C
Hgb A1c MFr Bld: 9.8 % — ABNORMAL HIGH (ref 4.8–5.6)
Mean Plasma Glucose: 234.56 mg/dL

## 2018-11-22 MED ORDER — ONDANSETRON HCL 4 MG PO TABS: 4.0000 mg | ORAL_TABLET | ORAL | Status: DC | PRN

## 2018-11-22 MED ORDER — MORPHINE SULFATE (PF) 2 MG/ML IV SOLN
1.0000 mg | INTRAVENOUS | Status: DC | PRN
  Administered 2018-11-22 – 2018-11-24 (×6): 1 mg via INTRAVENOUS
  Filled 2018-11-22 (×7): qty 1

## 2018-11-22 MED ORDER — INSULIN GLARGINE 100 UNIT/ML ~~LOC~~ SOLN
10.0000 [IU] | Freq: Every day | SUBCUTANEOUS | Status: DC
  Administered 2018-11-22 – 2018-11-24 (×3): 10 [IU] via SUBCUTANEOUS
  Filled 2018-11-22 (×5): qty 0.1

## 2018-11-22 MED ORDER — LACTATED RINGERS IV SOLN
INTRAVENOUS | Status: DC
  Administered 2018-11-22 – 2018-11-23 (×2): via INTRAVENOUS

## 2018-11-22 MED ORDER — SUCRALFATE 1 GM/10ML PO SUSP
1.0000 g | Freq: Three times a day (TID) | ORAL | Status: DC
  Administered 2018-11-22 – 2018-11-24 (×8): 1 g via ORAL
  Filled 2018-11-22 (×8): qty 10

## 2018-11-22 MED ORDER — ONDANSETRON HCL 4 MG/2ML IJ SOLN
4.0000 mg | INTRAMUSCULAR | Status: DC | PRN
  Administered 2018-11-22: 4 mg via INTRAVENOUS
  Filled 2018-11-22: qty 2

## 2018-11-22 MED ORDER — OXYCODONE-ACETAMINOPHEN 5-325 MG PO TABS
1.0000 | ORAL_TABLET | ORAL | Status: DC | PRN
  Administered 2018-11-24: 1 via ORAL
  Filled 2018-11-22: qty 1

## 2018-11-22 MED ORDER — PANTOPRAZOLE SODIUM 40 MG PO TBEC
40.0000 mg | DELAYED_RELEASE_TABLET | Freq: Two times a day (BID) | ORAL | Status: DC
  Administered 2018-11-22 – 2018-11-24 (×5): 40 mg via ORAL
  Filled 2018-11-22 (×5): qty 1

## 2018-11-22 NOTE — Progress Notes (Signed)
Inpatient Diabetes Program Recommendations  AACE/ADA: New Consensus Statement on Inpatient Glycemic Control (2015)  Target Ranges:  Prepandial:   less than 140 mg/dL      Peak postprandial:   less than 180 mg/dL (1-2 hours)      Critically ill patients:  140 - 180 mg/dL   Lab Results  Component Value Date   GLUCAP 195 (H) 11/22/2018   HGBA1C 9.8 (H) 11/21/2018    Review of Glycemic Control Results for Kyle Collins, Kyle Collins (MRN [id number]) as of 11/22/2018 15:09  Ref. Range 11/21/2018 23:27 11/22/2018 07:50 11/22/2018 11:41  Glucose-Capillary Latest Ref Range: 70 - 99 mg/dL 198 (H) 179 (H) 195 (H)   Diabetes history: Type 2 DM Outpatient Diabetes medications: Metformin 1000 mg BID, Jardiance 10 mg QD Current orders for Inpatient glycemic control: Lantus 10 units QD, Novolog 0-15 units TID, Novolog 0-5 units QHS  Inpatient Diabetes Program Recommendations:    Spoke with patient regarding outpatient DM management. Patient has not been taking oral medications "for a while". Was dismissed from Dr Liliane Channel practice in 2018. States, "I am open to whatever help you can give me just to feel better." Reviewed patient's current A1c of 10.3%. Explained what a A1c is and what it measures. Also reviewed goal A1c with patient, importance of good glucose control @ home, and blood sugar goals. Reviewed patho of DM, need for insulin, DKA, role of pancreas, vascular changes and comorbidites. Stressed the importance of gaining control of his DM.  Patient will need a meter at discharge. Blood glucose meter kit (includes lancets and strips) ([id number]). Patient has not been checking his blood sugars and hasn't in the past because Medicare has not provided enough test strips. Encouraged to begin checking 2-3 times per day and that Medicare will provide enough test strips if he is willing to start insulin. Patient is agreeable. Discussed diet and patient has avoided sugary beverages. However, is limited to following carb  modifications all the time because of limited income and resources. Is currently working with SW for information on food banks, etc. Will place consult for dietitian, [facility], attach list of endos and will send information regarding insulin pens. Encouraged patient to call and reschedule visit with PCP. Also, encouraged patient to begin working with RN toward self injections. Patient agrees and has no further questions at this time.  Will plan to follow up on 4/28.  Thanks, Bronson Curb, MSN, RNC-OB Diabetes Coordinator [phone] (8a-5p)

## 2018-11-22 NOTE — Progress Notes (Signed)
PROGRESS NOTE    EMAN MORIMOTO  IWO:[id number] DOB: [birth year] DOA: 11/20/2018 PCP: Neale Burly, MD    Brief Narrative:  33 year old male who presented with abdominal pain, vomiting, chest burning and constipation.  He does have significant past medical history of ADHD, anxiety, bipolar, depression, GERD, hypertension, sleep apnea, hypothyroidism and type 2 diabetes mellitus.  He reported progressive abdominal pain for the last 3 days, epigastric, associated with nausea and vomiting, positive polyuria and polydipsia along with polyphagia and blurry vision. He stopped taking his diabetic medications for the last 3 days. On his initial physical examination his temperature was 98.7, his heart rate 125 bpm, respiratory rate 14, blood pressure 98/63, oxygen saturation 96%.  Ill looking appearing, dry mucous membranes, lungs clear to auscultation bilaterally, heart S1-S2 present, tachycardic, abdomen protuberant, nontender, no lower extremity edema.  Sodium 131, potassium 4.4, chloride 90, bicarb 13, glucose 321, BUN 36, creatinine 1.19, anion gap 28, white count 15.6, hemoglobin 18.3, hematocrit 52.7, platelets 435.  Urine had more than 500 glucose, negative protein.  CT of the abdomen showed no abdominopelvic abnormality, positive cholelithiasis without acute inflammation.  His EKG had 123 bpm, with normal axis, normal intervals, sinus rhythm with normal conduction, no ST segment or T wave changes.  Patient was admitted to the hospital with a working diagnosis of diabetes ketoacidosis.    Assessment & Plan:   Principal Problem:   DKA (diabetic ketoacidosis) ([place]) Active Problems:   Essential hypertension, benign   Sleep apnea   GERD (gastroesophageal reflux disease)   Polycythemia   Hyperbilirubinemia   Cholelithiasis   1. DKA. Anion gap closes yesterday, this am with no chemistry resulted. Patient with nausea and vomiting, abdominal pain. Will follow on BMP for today, will resume [place]  acting insulin with 10 units of insulin glargine and continue insulin sliding scale. Patient at home on metformin and empagliflozin. His capillary glucose this am was 179 fasting.   2. Gallstones. Positive gallstones per CT, patient with abdominal pain, nausea and vomiting. CT with signs of cholecystitis. Will repeat imaging with [place]. Continue IV fluids and as needed antiemetics and analgesics.   3. GERD. Severe GERD, will add pantoprazole bid 40 mg and sucralfate. Will continue soft diet as tolerated.  4. HTN. Continue blood pressure control.continue with lisinopril and amlodipine. Continue holding on HCTZ for now.   5. Morbid obesity. BMI is 43,0. Will need follow up as outpatient.    DVT prophylaxis: enoxaparin   Code Status:  full Family Communication: no family at the bedside  Disposition Plan/ discharge barriers: pending clinical improvement.   Body mass index is 43.05 kg/m. Malnutrition Type:      Malnutrition Characteristics:      Nutrition Interventions:     RN Pressure Injury Documentation:     Consultants:     Procedures:     Antimicrobials:       Subjective: Patient continue to have nausea and vomiting, not tolerating regular diet. Persistent right upper quadrant pain, moderate to severe intensity, positive reflux and chest pain while supine.   Objective: Vitals:   11/21/18 1510 11/21/18 2100 11/22/18 0100 11/22/18 0500  BP: (!) 166/102 128/68 (!) 144/98 (!) 150/100  Pulse: 87 84 100 (!) 101  Resp:  14 14 16   Temp:  98 F (36.7 C) 98.6 F (37 C) 98.9 F (37.2 C)  TempSrc:  Oral Oral Oral  SpO2:  100% 100% 100%  Weight:      Height:  Intake/Output Summary (Last 24 hours) at 11/22/2018 0804 Last data filed at 11/22/2018 0600 Gross per 24 hour  Intake 1741.24 ml  Output -  Net 1741.24 ml   Filed Weights   11/20/18 2045 11/21/18 0102  Weight: (!) 145.2 kg (!) 140 kg    Examination:   General: ill looking appearing and in  pain.  Neurology: Awake and alert, non focal  E ENT: no pallor, no icterus, oral mucosa moist Cardiovascular: No JVD. S1-S2 present, rhythmic, no gallops, rubs, or murmurs. No lower extremity edema. Pulmonary: positive breath sounds bilaterally, adequate air movement, no wheezing, rhonchi or rales. Gastrointestinal. Abdomen protuberant with no organomegaly, tender to deep palpation on the right upper quadrant, NO frank murphy sign.  Skin. No rashes Musculoskeletal: no joint deformities     Data Reviewed: I have personally reviewed following labs and imaging studies  CBC: Recent Labs  Lab 11/20/18 2054 11/21/18 0557  WBC 15.6* 11.3*  NEUTROABS 13.2*  --   HGB 18.3* 15.0  HCT 52.7* 46.0  MCV 87.4 90.9  PLT 435* 706   Basic Metabolic Panel: Recent Labs  Lab 11/20/18 2328 11/21/18 0557 11/21/18 0948 11/21/18 1234 11/21/18 1814  NA 132* 133* 131* 130* 132*  133*  K 4.3 4.0 4.2 3.8 4.2  4.5  CL 95* 101 97* 97* 97*  97*  CO2 14* 17* 18* 19* 20*  23  GLUCOSE 213* 155* 210* 219* 207*  209*  BUN 31* 21* 21* 20 18  17   CREATININE 1.10 0.75 0.82 0.80 0.78  0.79  CALCIUM 8.9 8.2* 8.6* 8.9 8.9  9.1  MG 2.3  --   --   --   --   PHOS 2.9  --   --   --  1.5*   GFR: Estimated Creatinine Clearance: 189.8 mL/min (by C-G formula based on SCr of 0.79 mg/dL). Liver Function Tests: Recent Labs  Lab 11/20/18 2054 11/21/18 0557 11/21/18 1814  AST 20 16  --   ALT 29 22  --   ALKPHOS 87 68  --   BILITOT 2.4* 1.7*  --   PROT 8.8* 7.5  --   ALBUMIN 4.7 3.8 4.0   Recent Labs  Lab 11/20/18 2054  LIPASE 24   No results for input(s): AMMONIA in the last 168 hours. Coagulation Profile: No results for input(s): INR, PROTIME in the last 168 hours. Cardiac Enzymes: Recent Labs  Lab 11/21/18 0557 11/21/18 1234  TROPONINI <0.03 <0.03   BNP (last 3 results) No results for input(s): PROBNP in the last 8760 hours. HbA1C: No results for input(s): HGBA1C in the last 72 hours.  CBG: Recent Labs  Lab 11/21/18 1055 11/21/18 1612 11/21/18 2105 11/21/18 2327 11/22/18 0750  GLUCAP 249* 166* 233* 198* 179*   Lipid Profile: No results for input(s): CHOL, HDL, LDLCALC, TRIG, CHOLHDL, LDLDIRECT in the last 72 hours. Thyroid Function Tests: No results for input(s): TSH, T4TOTAL, FREET4, T3FREE, THYROIDAB in the last 72 hours. Anemia Panel: No results for input(s): VITAMINB12, FOLATE, FERRITIN, TIBC, IRON, RETICCTPCT in the last 72 hours.    Radiology Studies: I have reviewed all of the imaging during this hospital visit personally     Scheduled Meds: . amLODipine  10 mg Oral Daily  . insulin aspart  0-15 Units Subcutaneous TID WC  . insulin aspart  0-5 Units Subcutaneous QHS  . insulin glargine  10 Units Subcutaneous Daily  . lisinopril  40 mg Oral Daily  . pneumococcal 23 valent vaccine  0.5  mL Intramuscular Tomorrow-1000   Continuous Infusions: . sodium chloride 125 mL/hr at 11/22/18 0723  . sodium chloride 125 mL/hr at 11/21/18 1525     LOS: 0 days        Cotina Freedman Gerome Apley, MD

## 2018-11-22 NOTE — Progress Notes (Signed)
Pt transported down to ultrasound via transport staff. IV saline locked, pt urinated before transport. Will continue to monitor.

## 2018-11-22 NOTE — Care Management Obs Status (Signed)
[place] NOTIFICATION   Patient Details  Name: Kyle Collins MRN: [id number] Date of Birth: Mar 23, 1986   Medicare Observation Status Notification Given:  Yes    Tommy Medal 11/22/2018, 2:16 PM

## 2018-11-23 DIAGNOSIS — K8000 Calculus of gallbladder with acute cholecystitis without obstruction: Secondary | ICD-10-CM

## 2018-11-23 DIAGNOSIS — K2940 Chronic atrophic gastritis without bleeding: Secondary | ICD-10-CM

## 2018-11-23 LAB — CBC WITH DIFFERENTIAL/PLATELET
Abs Immature Granulocytes: 0.03 10*3/uL (ref 0.00–0.07)
Basophils Absolute: 0 10*3/uL (ref 0.0–0.1)
Basophils Relative: 1 %
Eosinophils Absolute: 0.1 10*3/uL (ref 0.0–0.5)
Eosinophils Relative: 1 %
HCT: 46.6 % (ref 39.0–52.0)
Hemoglobin: 16.3 g/dL (ref 13.0–17.0)
Immature Granulocytes: 0 %
Lymphocytes Relative: 57 %
Lymphs Abs: 4.9 10*3/uL — ABNORMAL HIGH (ref 0.7–4.0)
MCH: 30.6 pg (ref 26.0–34.0)
MCHC: 35 g/dL (ref 30.0–36.0)
MCV: 87.4 fL (ref 80.0–100.0)
Monocytes Absolute: 0.8 10*3/uL (ref 0.1–1.0)
Monocytes Relative: 9 %
Neutro Abs: 2.7 10*3/uL (ref 1.7–7.7)
Neutrophils Relative %: 32 %
Platelets: 272 10*3/uL (ref 150–400)
RBC: 5.33 MIL/uL (ref 4.22–5.81)
RDW: 12.3 % (ref 11.5–15.5)
WBC: 8.6 10*3/uL (ref 4.0–10.5)
nRBC: 0 % (ref 0.0–0.2)

## 2018-11-23 LAB — COMPREHENSIVE METABOLIC PANEL
ALT: 22 U/L (ref 0–44)
AST: 16 U/L (ref 15–41)
Albumin: 4 g/dL (ref 3.5–5.0)
Alkaline Phosphatase: 63 U/L (ref 38–126)
Anion gap: 12 (ref 5–15)
BUN: 12 mg/dL (ref 6–20)
CO2: 26 mmol/L (ref 22–32)
Calcium: 9.1 mg/dL (ref 8.9–10.3)
Chloride: 95 mmol/L — ABNORMAL LOW (ref 98–111)
Creatinine, Ser: 0.73 mg/dL (ref 0.61–1.24)
GFR calc Af Amer: >60 mL/min (ref >60)
GFR calc non Af Amer: >60 mL/min (ref >60)
Glucose, Bld: 193 mg/dL — ABNORMAL HIGH (ref 70–99)
Potassium: 3.5 mmol/L (ref 3.5–5.1)
Sodium: 133 mmol/L — ABNORMAL LOW (ref 135–145)
Total Bilirubin: 2 mg/dL — ABNORMAL HIGH (ref 0.3–1.2)
Total Protein: 7.4 g/dL (ref 6.5–8.1)

## 2018-11-23 LAB — GLUCOSE, CAPILLARY
Glucose-Capillary: 150 mg/dL — ABNORMAL HIGH (ref 70–99)
Glucose-Capillary: 154 mg/dL — ABNORMAL HIGH (ref 70–99)
Glucose-Capillary: 189 mg/dL — ABNORMAL HIGH (ref 70–99)
Glucose-Capillary: 180 mg/dL — ABNORMAL HIGH (ref 70–99)

## 2018-11-23 LAB — SURGICAL PCR SCREEN
MRSA, PCR: NEGATIVE
Staphylococcus aureus: NEGATIVE

## 2018-11-23 MED ORDER — MUPIROCIN 2 % EX OINT: 1.0000 "application " | TOPICAL_OINTMENT | Freq: Two times a day (BID) | CUTANEOUS | Status: DC

## 2018-11-23 MED ORDER — ENOXAPARIN SODIUM 40 MG/0.4ML ~~LOC~~ SOLN
40.0000 mg | Freq: Once | SUBCUTANEOUS | Status: AC
  Administered 2018-11-23: 40 mg via SUBCUTANEOUS
  Filled 2018-11-23: qty 0.4

## 2018-11-23 MED ORDER — CHLORHEXIDINE GLUCONATE CLOTH 2 % EX PADS: 6.0000 | MEDICATED_PAD | Freq: Once | CUTANEOUS | Status: DC

## 2018-11-23 MED ORDER — DEXTROSE 5 % IV SOLN
3.0000 g | INTRAVENOUS | Status: AC
  Administered 2018-11-24: 08:00:00 3 g via INTRAVENOUS
  Filled 2018-11-23: qty 3000

## 2018-11-23 MED ORDER — CHLORHEXIDINE GLUCONATE CLOTH 2 % EX PADS
6.0000 | MEDICATED_PAD | Freq: Once | CUTANEOUS | Status: AC
  Administered 2018-11-23: 6 via TOPICAL

## 2018-11-23 NOTE — Progress Notes (Addendum)
Inpatient Diabetes Program Recommendations  AACE/ADA: New Consensus Statement on Inpatient Glycemic Control (2015)  Target Ranges:  Prepandial:   less than 140 mg/dL      Peak postprandial:   less than 180 mg/dL (1-2 hours)      Critically ill patients:  140 - 180 mg/dL   Lab Results  Component Value Date   GLUCAP 150 (H) 11/23/2018   HGBA1C 9.8 (H) 11/21/2018    Review of Glycemic Control Results for FIONN, STRACKE (MRN [id number]) as of 11/23/2018 10:51  Ref. Range 11/22/2018 16:35 11/22/2018 21:09 11/23/2018 07:49  Glucose-Capillary Latest Ref Range: 70 - 99 mg/dL 151 (H) 146 (H) 150 (H)   Diabetes history: Type 2 DM Outpatient Diabetes medications: Metformin 1000 mg BID, Jardiance 10 mg QD Current orders for Inpatient glycemic control: Lantus 10 units QD, Novolog 0-15 units TID, Novolog 0-5 units QHS  Inpatient Diabetes Program Recommendations:    Consider increasing Lantus 12 units QD.  Patient will need a meter at discharge. Blood glucose meter kit (includes lancets and strips) ([id number]).   Spoke with patient again to review information relayed yesterday. Patient reports successful self injection using vial and syringe method with RN. Encouraged to continue practicing and viewing how to draw up insulin from vial. Also, encouraged to watch videos that I sent yesterday on both methods as patient prefers insulin pens at discharge. Reviewed survival skills, including hypoglycemia and interventions. Patient able to verbalize interventions and when to call MD. Information also attached to DC summary. Encouraged to being checking blood sugars 2- 3 times per day and reviewed how and when.  At this time, patient has no further questions.   Thanks, Bronson Curb, MSN, RNC-OB Diabetes Coordinator [phone] (8a-5p)

## 2018-11-23 NOTE — Consult Note (Signed)
Reason for Consult: Cholecystitis, cholelithiasis Referring Physician: Dr. Lafayette Dragon Kyle Collins is an 33 y.o. male.  HPI: Patient is a 33 year old morbidly obese white male with a history of cholelithiasis who presented to [facility] with epigastric pain and DKA.  Due to ongoing nausea and pain, an ultrasound was performed which revealed cholelithiasis with a gallstone lodged in the neck of the gallbladder.  Patient states that he continues to have intermittent episodes of abdominal pain and nausea when eating.  He states this has occurred intermittently while at home.  This has been the most severe episode.  He currently has minimal epigastric pain.  He states the pain is sometimes in the right upper quadrant and extends across his upper abdomen.  He does have reflux disease.  Past Medical History:  Diagnosis Date  . ADHD (attention deficit hyperactivity disorder)   . Anxiety   . Bipolar disorder ([place])   . Depression   . Diabetes mellitus   . GERD (gastroesophageal reflux disease)   . Headache   . Hemochromatosis 2019  . Hypertension   . Polycythemia   . Sleep apnea   . Thyroid disease     Past Surgical History:  Procedure Laterality Date  . CIRCUMCISION N/A 09/14/2017   Procedure: CIRCUMCISION;  Surgeon: Cleon Gustin, MD;  Location: [facility];  Service: Urology;  Laterality: N/A;  1 HR 336 [phone] - He has a Education officer, museum that needs to be called if changes made [facility] [employer] @ [facility]  . HERNIA REPAIR     scrotum  . MAXILLARY ANTROSTOMY Right 08/18/2018   Procedure: ENDOSCOPIC MAXILLARY RIGHT ANTROSTOMY WITH TISSUE REMOVAL;  Surgeon: Leta Baptist, MD;  Location: [place];  Service: ENT;  Laterality: Right;  . NASAL SEPTOPLASTY W/ TURBINOPLASTY Bilateral 08/18/2018   Procedure: NASAL SEPTOPLASTY WITH TURBINATE REDUCTION;  Surgeon: Leta Baptist, MD;  Location: [place];  Service: ENT;  Laterality: Bilateral;  . NASAL SEPTUM SURGERY  08/18/2018   . ORIF SHOULDER FRACTURE Right 02/28/2016   Procedure: OPEN REDUCTION INTERNAL FIXATION (ORIF) SHOULDER FRACTURE;  Surgeon: Carole Civil, MD;  Location: [facility];  Service: Orthopedics;  Laterality: Right;  . SHOULDER CLOSED REDUCTION Right 12/04/2015   Procedure: CLOSED REDUCTION SHOULDER;  Surgeon: Carole Civil, MD;  Location: [facility];  Service: Orthopedics;  Laterality: Right;  . TOOTH EXTRACTION  spring 2016   top left     Family History  Problem Relation Age of Onset  . Diabetes Mother   . Diabetes Father     Social History:  reports that he quit smoking about 9 years ago. His smoking use included e-cigarettes. He has a 2.50 pack-year smoking history. He has never used smokeless tobacco. He reports current alcohol use. He reports that he does not use drugs.  Allergies:  Allergies  Allergen Reactions  . Methylphenidate Derivatives Other (See Comments)    Patient states it made him "act like a zombie" when given as a child    Medications: I have reviewed the patient's current medications.  Results for orders placed or performed during the hospital encounter of 11/20/18 (from the past 48 hour(s))  Basic metabolic panel     Status: Abnormal   Collection Time: 11/21/18  9:48 AM  Result Value Ref Range   Sodium 131 (L) 135 - 145 mmol/L   Potassium 4.2 3.5 - 5.1 mmol/L   Chloride 97 (L) 98 - 111 mmol/L   CO2 18 (L) 22 - 32 mmol/L  Glucose, Bld 210 (H) 70 - 99 mg/dL   BUN 21 (H) 6 - 20 mg/dL   Creatinine, Ser 0.82 0.61 - 1.24 mg/dL   Calcium 8.6 (L) 8.9 - 10.3 mg/dL   GFR calc non Af Amer >60 >60 mL/min   GFR calc Af Amer >60 >60 mL/min   Anion gap 16 (H) 5 - 15    Comment: Performed at [facility], [address] [place], [place] [address]  Glucose, capillary     Status: Abnormal   Collection Time: 11/21/18 10:55 AM  Result Value Ref Range   Glucose-Capillary 249 (H) 70 - 99 mg/dL  Hemoglobin A1c     Status: Abnormal   Collection Time: 11/21/18 12:34 PM  Result  Value Ref Range   Hgb A1c MFr Bld 9.8 (H) 4.8 - 5.6 %    Comment: (NOTE) Pre diabetes:          5.7%-6.4% Diabetes:              >6.4% Glycemic control for   <7.0% adults with diabetes    Mean Plasma Glucose 234.56 mg/dL    Comment: Performed at [facility] [address], [place], [place] [address]  Basic metabolic panel     Status: Abnormal   Collection Time: 11/21/18 12:34 PM  Result Value Ref Range   Sodium 130 (L) 135 - 145 mmol/L   Potassium 3.8 3.5 - 5.1 mmol/L   Chloride 97 (L) 98 - 111 mmol/L   CO2 19 (L) 22 - 32 mmol/L   Glucose, Bld 219 (H) 70 - 99 mg/dL   BUN 20 6 - 20 mg/dL   Creatinine, Ser 0.80 0.61 - 1.24 mg/dL   Calcium 8.9 8.9 - 10.3 mg/dL   GFR calc non Af Amer >60 >60 mL/min   GFR calc Af Amer >60 >60 mL/min   Anion gap 14 5 - 15    Comment: Performed at [facility], [address] [place], [place] [address]  Troponin I - ONCE - STAT     Status: None   Collection Time: 11/21/18 12:34 PM  Result Value Ref Range   Troponin I <0.03 <0.03 ng/mL    Comment: Performed at [facility], [address] [place], [place] [address]  Glucose, capillary     Status: Abnormal   Collection Time: 11/21/18  4:12 PM  Result Value Ref Range   Glucose-Capillary 166 (H) 70 - 99 mg/dL  Basic metabolic panel     Status: Abnormal   Collection Time: 11/21/18  6:14 PM  Result Value Ref Range   Sodium 133 (L) 135 - 145 mmol/L   Potassium 4.5 3.5 - 5.1 mmol/L   Chloride 97 (L) 98 - 111 mmol/L   CO2 23 22 - 32 mmol/L   Glucose, Bld 209 (H) 70 - 99 mg/dL   BUN 17 6 - 20 mg/dL   Creatinine, Ser 0.79 0.61 - 1.24 mg/dL   Calcium 9.1 8.9 - 10.3 mg/dL   GFR calc non Af Amer >60 >60 mL/min   GFR calc Af Amer >60 >60 mL/min   Anion gap 13 5 - 15    Comment: Performed at [facility], [address] [place], [place] [address]  Renal function panel     Status: Abnormal   Collection Time: 11/21/18  6:14 PM  Result Value Ref Range   Sodium 132 (L) 135 - 145 mmol/L   Potassium  4.2 3.5 - 5.1 mmol/L   Chloride 97 (L)  98 - 111 mmol/L   CO2 20 (L) 22 - 32 mmol/L   Glucose, Bld 207 (H) 70 - 99 mg/dL   BUN 18 6 - 20 mg/dL   Creatinine, Ser 0.78 0.61 - 1.24 mg/dL   Calcium 8.9 8.9 - 10.3 mg/dL   Phosphorus 1.5 (L) 2.5 - 4.6 mg/dL   Albumin 4.0 3.5 - 5.0 g/dL   GFR calc non Af Amer >60 >60 mL/min   GFR calc Af Amer >60 >60 mL/min   Anion gap 15 5 - 15    Comment: Performed at [facility], [address], [place], [place] [address]  Glucose, capillary     Status: Abnormal   Collection Time: 11/21/18  9:05 PM  Result Value Ref Range   Glucose-Capillary 233 (H) 70 - 99 mg/dL  Glucose, capillary     Status: Abnormal   Collection Time: 11/21/18 11:27 PM  Result Value Ref Range   Glucose-Capillary 198 (H) 70 - 99 mg/dL  Glucose, capillary     Status: Abnormal   Collection Time: 11/22/18  7:50 AM  Result Value Ref Range   Glucose-Capillary 179 (H) 70 - 99 mg/dL   Comment 1 Notify RN    Comment 2 Document in Chart   Glucose, capillary     Status: Abnormal   Collection Time: 11/22/18 11:41 AM  Result Value Ref Range   Glucose-Capillary 195 (H) 70 - 99 mg/dL   Comment 1 Notify RN    Comment 2 Document in Chart   Glucose, capillary     Status: Abnormal   Collection Time: 11/22/18  4:35 PM  Result Value Ref Range   Glucose-Capillary 151 (H) 70 - 99 mg/dL   Comment 1 Notify RN    Comment 2 Document in Chart   Basic metabolic panel     Status: Abnormal   Collection Time: 11/22/18  5:03 PM  Result Value Ref Range   Sodium 135 135 - 145 mmol/L   Potassium 4.0 3.5 - 5.1 mmol/L   Chloride 100 98 - 111 mmol/L   CO2 20 (L) 22 - 32 mmol/L   Glucose, Bld 165 (H) 70 - 99 mg/dL   BUN 12 6 - 20 mg/dL   Creatinine, Ser 0.69 0.61 - 1.24 mg/dL   Calcium 9.1 8.9 - 10.3 mg/dL   GFR calc non Af Amer >60 >60 mL/min   GFR calc Af Amer >60 >60 mL/min   Anion gap 15 5 - 15    Comment: Performed at [facility], [address], [place],  [address]  Glucose, capillary      Status: Abnormal   Collection Time: 11/22/18  9:09 PM  Result Value Ref Range   Glucose-Capillary 146 (H) 70 - 99 mg/dL  Glucose, capillary     Status: Abnormal   Collection Time: 11/23/18  7:49 AM  Result Value Ref Range   Glucose-Capillary 150 (H) 70 - 99 mg/dL    US Abdomen Limited Ruq  Result Date: 11/22/2018 CLINICAL DATA:  Follow-up gallstones EXAM: ULTRASOUND ABDOMEN LIMITED RIGHT UPPER QUADRANT COMPARISON:  None. FINDINGS: Gallbladder: Cholelithiasis with the largest measuring 3 cm impacted in the gallbladder neck. Mild gallbladder wall thickening measuring 3 mm. No pericholecystic fluid. Positive sonographic Murphy sign. Common bile duct: Diameter: 5.5 mm Liver: No focal lesion identified. Increased hepatic parenchymal echogenicity. Portal vein is patent on color Doppler imaging with normal direction of blood flow towards the liver. IMPRESSION: 1. Cholelithiasis with 3 cm gallstone impacted in the gallbladder. No definitive  evidence of acute cholecystitis. 2. Hepatic steatosis. Electronically Signed   By: Kathreen Devoid   On: 11/22/2018 10:22    ROS:  Pertinent items are noted in HPI.  Blood pressure (!) 131/98, pulse 68, temperature 97.8 F (36.6 C), temperature source Oral, resp. rate 18, height 5\' 11"  (1.803 m), weight (!) 140 kg, SpO2 100 %. Physical Exam: Morbidly obese white male in no acute distress Head is normocephalic, atraumatic Lungs are clear to auscultation with good breath sounds bilaterally Heart examination reveals regular rate and rhythm without S3, S4, murmurs Abdomen is soft with mild discomfort in the right upper quadrant and epigastric region to palpation.  No hepatosplenomegaly, masses, hernias are appreciable.  Ultrasound report reviewed  Assessment/Plan: Impression: Cholecystitis, cholelithiasis, morbid obesity, diabetes mellitus, sleep apnea Plan: Patient will undergo laparoscopic cholecystectomy tomorrow as he will not tolerate a delay in surgery as per  COVID-19 protocol.  The risks and benefits of the procedure including bleeding, infection, hepatobiliary injury, and the possibility of an open procedure were fully explained to the patient, who gave informed consent.  He does realize he is at increased risk due to his morbid obesity.  Aviva Signs 11/23/2018, 8:47 AM

## 2018-11-23 NOTE — Progress Notes (Signed)
PROGRESS NOTE    Kyle Collins  LOV:[id number] DOB: [birth year] DOA: 11/20/2018 PCP: Neale Burly, MD    Brief Narrative:  33 year old male who presented with abdominal pain, vomiting, chestburningand constipation. He does have significant past medical history of ADHD,anxiety, bipolar, depression, GERD, hypertension, sleep apnea, hypothyroidism and type 2 diabetes mellitus.He reported progressive abdominal pain for the last 3 days, epigastric, associated with nausea and vomiting, positive polyuria and polydipsia along with polyphagia and blurry vision. He stopped taking his diabetic medications for the last 3 days.On his initial physical examination his temperature was 98.7, his heart rate 125 bpm, respiratory rate 14, blood pressure 98/63, oxygen saturation 96%. Ill looking appearing, dry mucous membranes, lungs clear to auscultation bilaterally, heart S1-S2 present, tachycardic, abdomen protuberant, nontender, no lower extremity edema.Sodium 131, potassium 4.4, chloride 90, bicarb 13, glucose 321, BUN 36, creatinine 1.19, anion gap 28,white count 15.6, hemoglobin 18.3, hematocrit 52.7, platelets 435.Urine had more than 500 glucose, negative protein.CT of the abdomen showed no abdominopelvic abnormality, positive cholelithiasis without acute inflammation.His EKG had 123 bpm, with normal axis, normal intervals, sinus rhythm with normal conduction, no ST segment or T wave changes.  Patient was admitted to the hospital with a working diagnosis of diabetes ketoacidosis.   Assessment & Plan:   Principal Problem:   DKA (diabetic ketoacidosis) ([place]) Active Problems:   Essential hypertension, benign   Sleep apnea   GERD (gastroesophageal reflux disease)   Polycythemia   Hyperbilirubinemia   Cholelithiasis   Gastritis   DKA (diabetic ketoacidoses) ([place])   1. O8CZ complicated with DKA. Patient has recovered from DKA, his fasting glucose this am is 193, with normal anion gap.  Tolerating well clear liquid diet. Will continue basal insulin with 10 units of glargine. At home on metformin and empagliflozin. Plan to resume metformin at discharge but no empagliflozin due to risk of recurrent DKA. Will dc IV fluids for now.   2. Gallstones with positive biliary colic. [place] with a 3 cm gallstone impacted in the gallbladder neck. Case discussed with Dr. Arnoldo Morale from surgery and will plan for cholecystectomy in am. Total bilirubin elevated at 2,0, with normal. ALK-P, AST and ALT.   3. GERD. Continue antiacid therapy with pantoprazole bid 40 mg and sucralfate. For now on clear liquid diet in preparation for surgery in am.   4. HTN. On lisinopril and amlodipine, for blood pressure control, systolic blood pressure 660 mmHg.   5. Morbid obesity. BMI is 43,0. Outpatient follow up.    DVT prophylaxis: enoxaparin   Code Status:  full Family Communication: no family at the bedside  Disposition Plan/ discharge barriers: plan for cholecystectomy in am.   Body mass index is 43.05 kg/m. Malnutrition Type:      Malnutrition Characteristics:      Nutrition Interventions:     RN Pressure Injury Documentation:     Consultants:    Surgery   Procedures:     Antimicrobials:       Subjective: Patient continue to have right upper quadrant pain, controlled with analgesics and antiemetics. Tolerating clear liquid diet, intermittent nausea.   Objective: Vitals:   11/22/18 1300 11/22/18 2029 11/22/18 2117 11/23/18 0547  BP: 130/79  130/90 (!) 131/98  Pulse: 91  92 68  Resp: _0 Temp: 98 F (36.7 C)  98.7 F (37.1 C) 97.8 F (36.6 C)  TempSrc: Oral  Oral Oral  SpO2: 100% 96% 99% 100%  Weight:      Height:  Intake/Output Summary (Last 24 hours) at 11/23/2018 0830 Last data filed at 11/22/2018 1829 Gross per 24 hour  Intake 694.17 ml  Output -  Net 694.17 ml   Filed Weights   11/20/18 2045 11/21/18 0102  Weight: (!) 145.2 kg (!) 140  kg    Examination:   General: Not in pain or dyspnea, deconditioned  Neurology: Awake and alert, non focal  E ENT: mild pallor, no icterus, oral mucosa moist Cardiovascular: No JVD. S1-S2 present, rhythmic, no gallops, rubs, or murmurs. No lower extremity edema. Pulmonary:  positive breath sounds bilaterally, adequate air movement, no wheezing, rhonchi or rales. Gastrointestinal. Abdomen protuberant with no organomegaly, tender to deep palpation at the right upper quadrant, with no rebound or guarding Skin. No rashes Musculoskeletal: no joint deformities     Data Reviewed: I have personally reviewed following labs and imaging studies  CBC: Recent Labs  Lab 11/20/18 2054 11/21/18 0557  WBC 15.6* 11.3*  NEUTROABS 13.2*  --   HGB 18.3* 15.0  HCT 52.7* 46.0  MCV 87.4 90.9  PLT 435* 983   Basic Metabolic Panel: Recent Labs  Lab 11/20/18 2328 11/21/18 0557 11/21/18 0948 11/21/18 1234 11/21/18 1814 11/22/18 1703  NA 132* 133* 131* 130* 132*  133* 135  K 4.3 4.0 4.2 3.8 4.2  4.5 4.0  CL 95* 101 97* 97* 97*  97* 100  CO2 14* 17* 18* 19* 20*  23 20*  GLUCOSE 213* 155* 210* 219* 207*  209* 165*  BUN 31* 21* 21* _0 CREATININE 1.10 0.75 0.82 0.80 0.78  0.79 0.69  CALCIUM 8.9 8.2* 8.6* 8.9 8.9  9.1 9.1  MG 2.3  --   --   --   --   --   PHOS 2.9  --   --   --  1.5*  --    GFR: Estimated Creatinine Clearance: 189.8 mL/min (by C-G formula based on SCr of 0.69 mg/dL). Liver Function Tests: Recent Labs  Lab 11/20/18 2054 11/21/18 0557 11/21/18 1814  AST 20 16  --   ALT 29 22  --   ALKPHOS 87 68  --   BILITOT 2.4* 1.7*  --   PROT 8.8* 7.5  --   ALBUMIN 4.7 3.8 4.0   Recent Labs  Lab 11/20/18 2054  LIPASE 24   No results for input(s): AMMONIA in the last 168 hours. Coagulation Profile: No results for input(s): INR, PROTIME in the last 168 hours. Cardiac Enzymes: Recent Labs  Lab 11/21/18 0557 11/21/18 1234  TROPONINI <0.03 <0.03   BNP  (last 3 results) No results for input(s): PROBNP in the last 8760 hours. HbA1C: Recent Labs    11/21/18 1234  HGBA1C 9.8*   CBG: Recent Labs  Lab 11/22/18 0750 11/22/18 1141 11/22/18 1635 11/22/18 2109 11/23/18 0749  GLUCAP 179* 195* 151* 146* 150*   Lipid Profile: No results for input(s): CHOL, HDL, LDLCALC, TRIG, CHOLHDL, LDLDIRECT in the last 72 hours. Thyroid Function Tests: No results for input(s): TSH, T4TOTAL, FREET4, T3FREE, THYROIDAB in the last 72 hours. Anemia Panel: No results for input(s): VITAMINB12, FOLATE, FERRITIN, TIBC, IRON, RETICCTPCT in the last 72 hours.    Radiology Studies: I have reviewed all of the imaging during this hospital visit personally     Scheduled Meds: . amLODipine  10 mg Oral Daily  . insulin aspart  0-15 Units Subcutaneous TID WC  . insulin aspart  0-5 Units Subcutaneous QHS  . insulin glargine  10  Units Subcutaneous Daily  . lisinopril  40 mg Oral Daily  . pantoprazole  40 mg Oral BID  . sucralfate  1 g Oral TID WC & HS   Continuous Infusions: . lactated ringers 50 mL/hr at 11/22/18 1052     LOS: 1 day        Glendale Youngblood Gerome Apley, MD

## 2018-11-23 NOTE — TOC Initial Note (Signed)
Transition of Care [facility]) - Initial/Assessment Note    Patient Details  Name: Kyle Collins MRN: [id number] Date of Birth: Mar 30, 1986  Transition of Care [facility]) CM/SW Contact:    Sherald Barge, RN Phone Number: 11/23/2018, 11:56 AM  Clinical Narrative:   [facility] consulted for medication needs. Pt will DC on insulin. CM has checked formulary and Lantus Solostar pens are covered by Medicaid. Pt scheduled for lap chole this week. Has community Education officer, museum per admission hx (believe this is r/t his medicaid). On [facility] registry with status pending. No notes from [employer] rep noted in chart review. CM will place another [facility] referral for CM follow up after DC. Low readmission risk score. CM will sign off but may re re consulted if needs arise.              Expected Discharge Plan: Home/Self Care    Expected Discharge Plan and Services Expected Discharge Plan: Home/Self Care In-house Referral: Nutrition Discharge Planning Services: Medication Assistance   Living arrangements for the past 2 months: Apartment                     Prior Living Arrangements/Services Living arrangements for the past 2 months: Apartment Lives with:: Self Patient language and need for interpreter reviewed:: Yes        Need for Family Participation in Patient Care: No (Comment) Care giver support system in place?: Yes (comment)   Criminal Activity/Legal Involvement Pertinent to Current Situation/Hospitalization: No - Comment as needed  Activities of Daily Living Home Assistive Devices/Equipment: None ADL Screening (condition at time of admission) Patient's cognitive ability adequate to safely complete daily activities?: Yes Is the patient deaf or have difficulty hearing?: No Does the patient have difficulty seeing, even when wearing glasses/contacts?: No Does the patient have difficulty concentrating, remembering, or making decisions?: No Patient able to express need for assistance with ADLs?: Yes Does the  patient have difficulty dressing or bathing?: No Independently performs ADLs?: Yes (appropriate for developmental age) Does the patient have difficulty walking or climbing stairs?: No Weakness of Legs: None Weakness of Arms/Hands: None    Admission diagnosis:  Diabetic ketoacidosis without coma associated with type 2 diabetes mellitus ([place]) [E11.10] Patient Active Problem List   Diagnosis Date Noted  . Gastritis 11/22/2018  . DKA (diabetic ketoacidoses) ([place]) 11/22/2018  . DKA (diabetic ketoacidosis) ([place]) 11/20/2018  . Sleep apnea 11/20/2018  . GERD (gastroesophageal reflux disease) 11/20/2018  . Polycythemia 11/20/2018  . Hyperbilirubinemia 11/20/2018  . Cholelithiasis 11/20/2018  . S/P nasal septoplasty 08/18/2018  . Fracture dislocation of right shoulder joint 12/04/2015  . Dislocation, shoulder closed   . Greater tuberosity of humerus fracture   . Morbid obesity due to excess calories ([place]) 10/26/2015  . Uncontrolled type 2 diabetes mellitus without complication, without [place]-term current use of insulin ([place]) 07/16/2015  . Essential hypertension, benign 07/16/2015  . Personal history of noncompliance with medical treatment, presenting hazards to health 07/16/2015   PCP:  Neale Burly, MD Pharmacy:   [facility], [place] [address] Phone: [phone] Fax: [phone]

## 2018-11-23 NOTE — H&P (View-Only) (Signed)
Reason for Consult: Cholecystitis, cholelithiasis Referring Physician: Dr. Lafayette Dragon Kyle Collins is an 33 y.o. male.  HPI: Patient is a 33 year old morbidly obese white male with a history of cholelithiasis who presented to [facility] with epigastric pain and DKA.  Due to ongoing nausea and pain, an ultrasound was performed which revealed cholelithiasis with a gallstone lodged in the neck of the gallbladder.  Patient states that he continues to have intermittent episodes of abdominal pain and nausea when eating.  He states this has occurred intermittently while at home.  This has been the most severe episode.  He currently has minimal epigastric pain.  He states the pain is sometimes in the right upper quadrant and extends across his upper abdomen.  He does have reflux disease.  Past Medical History:  Diagnosis Date  . ADHD (attention deficit hyperactivity disorder)   . Anxiety   . Bipolar disorder ([name])   . Depression   . Diabetes mellitus   . GERD (gastroesophageal reflux disease)   . Headache   . Hemochromatosis 2019  . Hypertension   . Polycythemia   . Sleep apnea   . Thyroid disease     Past Surgical History:  Procedure Laterality Date  . CIRCUMCISION N/A 09/14/2017   Procedure: CIRCUMCISION;  Surgeon: Cleon Gustin, MD;  Location: [facility];  Service: Urology;  Laterality: N/A;  1 HR 336 [phone] - He has a Education officer, museum that needs to be called if changes made Mateo [facility] @ [facility]  . HERNIA REPAIR     scrotum  . MAXILLARY ANTROSTOMY Right 08/18/2018   Procedure: ENDOSCOPIC MAXILLARY RIGHT ANTROSTOMY WITH TISSUE REMOVAL;  Surgeon: Leta Baptist, MD;  Location: [place];  Service: ENT;  Laterality: Right;  . NASAL SEPTOPLASTY W/ TURBINOPLASTY Bilateral 08/18/2018   Procedure: NASAL SEPTOPLASTY WITH TURBINATE REDUCTION;  Surgeon: Leta Baptist, MD;  Location: [place];  Service: ENT;  Laterality: Bilateral;  . NASAL SEPTUM SURGERY  08/18/2018   . ORIF SHOULDER FRACTURE Right 02/28/2016   Procedure: OPEN REDUCTION INTERNAL FIXATION (ORIF) SHOULDER FRACTURE;  Surgeon: Carole Civil, MD;  Location: [facility];  Service: Orthopedics;  Laterality: Right;  . SHOULDER CLOSED REDUCTION Right 12/04/2015   Procedure: CLOSED REDUCTION SHOULDER;  Surgeon: Carole Civil, MD;  Location: [facility];  Service: Orthopedics;  Laterality: Right;  . TOOTH EXTRACTION  spring 2016   top left     Family History  Problem Relation Age of Onset  . Diabetes Mother   . Diabetes Father     Social History:  reports that he quit smoking about 9 years ago. His smoking use included e-cigarettes. He has a 2.50 pack-year smoking history. He has never used smokeless tobacco. He reports current alcohol use. He reports that he does not use drugs.  Allergies:  Allergies  Allergen Reactions  . Methylphenidate Derivatives Other (See Comments)    Patient states it made him "act like a zombie" when given as a child    Medications: I have reviewed the patient's current medications.  Results for orders placed or performed during the hospital encounter of 11/20/18 (from the past 48 hour(s))  Basic metabolic panel     Status: Abnormal   Collection Time: 11/21/18  9:48 AM  Result Value Ref Range   Sodium 131 (L) 135 - 145 mmol/L   Potassium 4.2 3.5 - 5.1 mmol/L   Chloride 97 (L) 98 - 111 mmol/L   CO2 18 (L) 22 - 32 mmol/L  Glucose, Bld 210 (H) 70 - 99 mg/dL   BUN 21 (H) 6 - 20 mg/dL   Creatinine, Ser 0.82 0.61 - 1.24 mg/dL   Calcium 8.6 (L) 8.9 - 10.3 mg/dL   GFR calc non Af Amer >60 >60 mL/min   GFR calc Af Amer >60 >60 mL/min   Anion gap 16 (H) 5 - 15    Comment: Performed at [facility], [address], [place], [place] [address]  Glucose, capillary     Status: Abnormal   Collection Time: 11/21/18 10:55 AM  Result Value Ref Range   Glucose-Capillary 249 (H) 70 - 99 mg/dL  Hemoglobin A1c     Status: Abnormal   Collection Time: 11/21/18 12:34 PM  Result  Value Ref Range   Hgb A1c MFr Bld 9.8 (H) 4.8 - 5.6 %    Comment: (NOTE) Pre diabetes:          5.7%-6.4% Diabetes:              >6.4% Glycemic control for   <7.0% adults with diabetes    Mean Plasma Glucose 234.56 mg/dL    Comment: Performed at [facility] [address] [place], [place] [address]  Basic metabolic panel     Status: Abnormal   Collection Time: 11/21/18 12:34 PM  Result Value Ref Range   Sodium 130 (L) 135 - 145 mmol/L   Potassium 3.8 3.5 - 5.1 mmol/L   Chloride 97 (L) 98 - 111 mmol/L   CO2 19 (L) 22 - 32 mmol/L   Glucose, Bld 219 (H) 70 - 99 mg/dL   BUN 20 6 - 20 mg/dL   Creatinine, Ser 0.80 0.61 - 1.24 mg/dL   Calcium 8.9 8.9 - 10.3 mg/dL   GFR calc non Af Amer >60 >60 mL/min   GFR calc Af Amer >60 >60 mL/min   Anion gap 14 5 - 15    Comment: Performed at [facility], [address], [place], [place] [address]  Troponin I - ONCE - STAT     Status: None   Collection Time: 11/21/18 12:34 PM  Result Value Ref Range   Troponin I <0.03 <0.03 ng/mL    Comment: Performed at [facility], [address] [place], [place] [address]  Glucose, capillary     Status: Abnormal   Collection Time: 11/21/18  4:12 PM  Result Value Ref Range   Glucose-Capillary 166 (H) 70 - 99 mg/dL  Basic metabolic panel     Status: Abnormal   Collection Time: 11/21/18  6:14 PM  Result Value Ref Range   Sodium 133 (L) 135 - 145 mmol/L   Potassium 4.5 3.5 - 5.1 mmol/L   Chloride 97 (L) 98 - 111 mmol/L   CO2 23 22 - 32 mmol/L   Glucose, Bld 209 (H) 70 - 99 mg/dL   BUN 17 6 - 20 mg/dL   Creatinine, Ser 0.79 0.61 - 1.24 mg/dL   Calcium 9.1 8.9 - 10.3 mg/dL   GFR calc non Af Amer >60 >60 mL/min   GFR calc Af Amer >60 >60 mL/min   Anion gap 13 5 - 15    Comment: Performed at [facility], [address], [place], [place] [address]  Renal function panel     Status: Abnormal   Collection Time: 11/21/18  6:14 PM  Result Value Ref Range   Sodium 132 (L) 135 - 145 mmol/L   Potassium  4.2 3.5 - 5.1 mmol/L   Chloride 97 (L)  98 - 111 mmol/L   CO2 20 (L) 22 - 32 mmol/L   Glucose, Bld 207 (H) 70 - 99 mg/dL   BUN 18 6 - 20 mg/dL   Creatinine, Ser 0.78 0.61 - 1.24 mg/dL   Calcium 8.9 8.9 - 10.3 mg/dL   Phosphorus 1.5 (L) 2.5 - 4.6 mg/dL   Albumin 4.0 3.5 - 5.0 g/dL   GFR calc non Af Amer >60 >60 mL/min   GFR calc Af Amer >60 >60 mL/min   Anion gap 15 5 - 15    Comment: Performed at [facility], [address], [place], [place] [address]  Glucose, capillary     Status: Abnormal   Collection Time: 11/21/18  9:05 PM  Result Value Ref Range   Glucose-Capillary 233 (H) 70 - 99 mg/dL  Glucose, capillary     Status: Abnormal   Collection Time: 11/21/18 11:27 PM  Result Value Ref Range   Glucose-Capillary 198 (H) 70 - 99 mg/dL  Glucose, capillary     Status: Abnormal   Collection Time: 11/22/18  7:50 AM  Result Value Ref Range   Glucose-Capillary 179 (H) 70 - 99 mg/dL   Comment 1 Notify RN    Comment 2 Document in Chart   Glucose, capillary     Status: Abnormal   Collection Time: 11/22/18 11:41 AM  Result Value Ref Range   Glucose-Capillary 195 (H) 70 - 99 mg/dL   Comment 1 Notify RN    Comment 2 Document in Chart   Glucose, capillary     Status: Abnormal   Collection Time: 11/22/18  4:35 PM  Result Value Ref Range   Glucose-Capillary 151 (H) 70 - 99 mg/dL   Comment 1 Notify RN    Comment 2 Document in Chart   Basic metabolic panel     Status: Abnormal   Collection Time: 11/22/18  5:03 PM  Result Value Ref Range   Sodium 135 135 - 145 mmol/L   Potassium 4.0 3.5 - 5.1 mmol/L   Chloride 100 98 - 111 mmol/L   CO2 20 (L) 22 - 32 mmol/L   Glucose, Bld 165 (H) 70 - 99 mg/dL   BUN 12 6 - 20 mg/dL   Creatinine, Ser 0.69 0.61 - 1.24 mg/dL   Calcium 9.1 8.9 - 10.3 mg/dL   GFR calc non Af Amer >60 >60 mL/min   GFR calc Af Amer >60 >60 mL/min   Anion gap 15 5 - 15    Comment: Performed at [facility], [address] [place], [place] [address]  Glucose, capillary      Status: Abnormal   Collection Time: 11/22/18  9:09 PM  Result Value Ref Range   Glucose-Capillary 146 (H) 70 - 99 mg/dL  Glucose, capillary     Status: Abnormal   Collection Time: 11/23/18  7:49 AM  Result Value Ref Range   Glucose-Capillary 150 (H) 70 - 99 mg/dL    US Abdomen Limited Ruq  Result Date: 11/22/2018 CLINICAL DATA:  Follow-up gallstones EXAM: ULTRASOUND ABDOMEN LIMITED RIGHT UPPER QUADRANT COMPARISON:  None. FINDINGS: Gallbladder: Cholelithiasis with the largest measuring 3 cm impacted in the gallbladder neck. Mild gallbladder wall thickening measuring 3 mm. No pericholecystic fluid. Positive sonographic Murphy sign. Common bile duct: Diameter: 5.5 mm Liver: No focal lesion identified. Increased hepatic parenchymal echogenicity. Portal vein is patent on color Doppler imaging with normal direction of blood flow towards the liver. IMPRESSION: 1. Cholelithiasis with 3 cm gallstone impacted in the gallbladder. No definitive  evidence of acute cholecystitis. 2. Hepatic steatosis. Electronically Signed   By: Kathreen Devoid   On: 11/22/2018 10:22    ROS:  Pertinent items are noted in HPI.  Blood pressure (!) 131/98, pulse 68, temperature 97.8 F (36.6 C), temperature source Oral, resp. rate 18, height 5\' 11"  (1.803 m), weight (!) 140 kg, SpO2 100 %. Physical Exam: Morbidly obese white male in no acute distress Head is normocephalic, atraumatic Lungs are clear to auscultation with good breath sounds bilaterally Heart examination reveals regular rate and rhythm without S3, S4, murmurs Abdomen is soft with mild discomfort in the right upper quadrant and epigastric region to palpation.  No hepatosplenomegaly, masses, hernias are appreciable.  Ultrasound report reviewed  Assessment/Plan: Impression: Cholecystitis, cholelithiasis, morbid obesity, diabetes mellitus, sleep apnea Plan: Patient will undergo laparoscopic cholecystectomy tomorrow as he will not tolerate a delay in surgery as per  COVID-19 protocol.  The risks and benefits of the procedure including bleeding, infection, hepatobiliary injury, and the possibility of an open procedure were fully explained to the patient, who gave informed consent.  He does realize he is at increased risk due to his morbid obesity.  Aviva Signs 11/23/2018, 8:47 AM

## 2018-11-23 NOTE — Plan of Care (Signed)
Nutrition Education Note  RD consulted for nutrition education regarding diabetes. He will be discharging on insulin.   Lab Results  Component Value Date   HGBA1C 9.8 (H) 11/21/2018   Pt reports he has been a diabetic since Age 33. He has never been on insulin prior. He says he does not follow a diabetic diet per se, but does try to eat healthy.   Diet recall. He says "I just eat when I am hungry". Typically, he eats 1-2 big meals during the day. He does not snack at all. He eats items such as "instant potted chicken" or "baked chicken fingers". He says he does not eat fried foods. He eats Mac and Cheese and is upfront about saying "that will never stop" (this is a comfort food). He reports eating vegetables with his meals. He denies eating corn or potatoes very frequently. He drinks water and green tea with citrus. He says he reads labels. He is "not a sweets person".   RD first educated on importance of correcting his disordered eating pattern. He says he is well aware he cannot skip meal on insulin; he had administered insulin to father for many years. RD recommended eating atleast 3x a day.   Pt says he eats vegetables with his meals. RD asked for examples and each one he listed was appropriate; they were not starchy vegetables. Reviewed the difference between starchy and nonstarchy vegetables. RD showed example of the My PLate. His plate should be 56% veg/protein and only 25% carb.   RD reviewed some of his reported meal items. RD was not as concerned about the frying vs baking as he was the breaded items the patient is consuming. Noted all this breading will contain carbs.   He says the entrees he eats "wont change..im poor". He asks "how bad is mac and cheese for me...because Im not gonna stop eating that?". RD told pt that he should still be able to fit these items into his diet if they balanced appropriately. RD gave pt a "carb budget" of 75-80g/meal". He can "spend" his carbs on the items  he chooses, such as his mac and cheese, but he will not get neatly "bang for his buck" if he spends these on carb dense items. He will have to balance the meal with meats and vegetables. Reiterated the mac/cheese should be no more than 25% of his total meal.   We discussed nutrition labels. Pt reports being previously diagnosed w/ hemachromatosis and he has learned to read labels as part of this dx. RD stressed importance of looking at "Serving Size" and how this can dramatically alter an items listed nutrition information. RD recommended looking for items high in fiber/protein and low in total carbs/added sugar.   RD encouraged compliance by saying these diet tips would likely beget weight loss, which would help improve glycemic control. He does not think changing his meal pattern will help "my thyroid gland is a crater".  Major Nutrition Goals: Eat regularly throughout the day. DONT skip meals Make 75% of meal vegetables (non-starchy) and protein, 25% carb Aim for 75-80g Carb at meals MAX.  You can still have your favorite foods (mac/cheese) as Ruble as meal time carbs are <75-80. Pair mac and cheese with chicken (non breaded) and non stachy vegetables.     Expect Poor-fair compliance. It is quite obvious pt already has substantial understanding of a DM diet, however he has a very defeatist attitude. He says he his hypothyroid makes it impossible to  lose weight. He believes his limited funds relegate him to eating unhealthy frozen meals. He is upfront about saying he will not stop eating mac and cheese, this food brings him comfort.   Body mass index is 43.05 kg/m. Pt meets criteria for morbidly obese based on current BMI.  If additional nutrition issues arise, please re-consult RD.  Burtis Junes RD, LDN, CNSC Clinical Nutrition Available Tues-Sat via Pager: [phone] 11/23/2018 12:54 PM

## 2018-11-24 ENCOUNTER — Encounter (HOSPITAL_COMMUNITY)
Admission: EM | Disposition: A | Discharge status: Home / Self Care | Payer: Self-pay | Source: Home / Self Care | Attending: Internal Medicine

## 2018-11-24 ENCOUNTER — Encounter (HOSPITAL_COMMUNITY): Payer: Self-pay | Admitting: *Deleted

## 2018-11-24 ENCOUNTER — Inpatient Hospital Stay (HOSPITAL_COMMUNITY): Payer: Medicare Other | Admitting: Anesthesiology

## 2018-11-24 DIAGNOSIS — K8000 Calculus of gallbladder with acute cholecystitis without obstruction: Secondary | ICD-10-CM | POA: Diagnosis not present

## 2018-11-24 DIAGNOSIS — G4730 Sleep apnea, unspecified: Secondary | ICD-10-CM

## 2018-11-24 DIAGNOSIS — R1011 Right upper quadrant pain: Secondary | ICD-10-CM

## 2018-11-24 DIAGNOSIS — R109 Unspecified abdominal pain: Secondary | ICD-10-CM

## 2018-11-24 HISTORY — PX: CHOLECYSTECTOMY: SHX55

## 2018-11-24 LAB — BASIC METABOLIC PANEL
Anion gap: 10 (ref 5–15)
BUN: 10 mg/dL (ref 6–20)
CO2: 28 mmol/L (ref 22–32)
Calcium: 9.3 mg/dL (ref 8.9–10.3)
Chloride: 98 mmol/L (ref 98–111)
Creatinine, Ser: 0.72 mg/dL (ref 0.61–1.24)
GFR calc Af Amer: >60 mL/min (ref >60)
GFR calc non Af Amer: >60 mL/min (ref >60)
Glucose, Bld: 178 mg/dL — ABNORMAL HIGH (ref 70–99)
Potassium: 3.8 mmol/L (ref 3.5–5.1)
Sodium: 136 mmol/L (ref 135–145)

## 2018-11-24 LAB — GLUCOSE, CAPILLARY
Glucose-Capillary: 196 mg/dL — ABNORMAL HIGH (ref 70–99)
Glucose-Capillary: 263 mg/dL — ABNORMAL HIGH (ref 70–99)
Glucose-Capillary: 267 mg/dL — ABNORMAL HIGH (ref 70–99)

## 2018-11-24 SURGERY — LAPAROSCOPIC CHOLECYSTECTOMY
Anesthesia: General | Site: Abdomen

## 2018-11-24 MED ORDER — FENTANYL CITRATE (PF) 250 MCG/5ML IJ SOLN
INTRAMUSCULAR | Status: AC
  Filled 2018-11-24: qty 5

## 2018-11-24 MED ORDER — HYDROMORPHONE HCL 1 MG/ML IJ SOLN
0.2500 mg | INTRAMUSCULAR | Status: DC | PRN
  Administered 2018-11-24 (×3): 0.5 mg via INTRAVENOUS
  Filled 2018-11-24 (×3): qty 0.5

## 2018-11-24 MED ORDER — PROMETHAZINE HCL 25 MG/ML IJ SOLN: 6.2500 mg | INTRAMUSCULAR | Status: DC | PRN

## 2018-11-24 MED ORDER — INSULIN PEN NEEDLE 31G X 8 MM MISC: 1 refills | Status: AC

## 2018-11-24 MED ORDER — FENTANYL CITRATE (PF) 100 MCG/2ML IJ SOLN
INTRAMUSCULAR | Status: AC
  Filled 2018-11-24: qty 2

## 2018-11-24 MED ORDER — MIDAZOLAM HCL 2 MG/2ML IJ SOLN: 0.5000 mg | Freq: Once | INTRAMUSCULAR | Status: DC | PRN

## 2018-11-24 MED ORDER — HYDROCODONE-ACETAMINOPHEN 7.5-325 MG PO TABS: 1.0000 | ORAL_TABLET | Freq: Once | ORAL | Status: DC | PRN

## 2018-11-24 MED ORDER — LIDOCAINE 2% (20 MG/ML) 5 ML SYRINGE
INTRAMUSCULAR | Status: DC | PRN
  Administered 2018-11-24: 40 mg via INTRAVENOUS

## 2018-11-24 MED ORDER — SUGAMMADEX SODIUM 500 MG/5ML IV SOLN
INTRAVENOUS | Status: AC
  Filled 2018-11-24: qty 5

## 2018-11-24 MED ORDER — LIDOCAINE 2% (20 MG/ML) 5 ML SYRINGE
INTRAMUSCULAR | Status: AC
  Filled 2018-11-24: qty 5

## 2018-11-24 MED ORDER — PANTOPRAZOLE SODIUM 40 MG PO TBEC: 40.0000 mg | DELAYED_RELEASE_TABLET | Freq: Every day | ORAL | 1 refills | Status: DC

## 2018-11-24 MED ORDER — SUCCINYLCHOLINE CHLORIDE 20 MG/ML IJ SOLN
INTRAMUSCULAR | Status: DC | PRN
  Administered 2018-11-24: 180 mg via INTRAVENOUS

## 2018-11-24 MED ORDER — CEFAZOLIN SODIUM-DEXTROSE 1-4 GM/50ML-% IV SOLN
INTRAVENOUS | Status: AC
  Filled 2018-11-24: qty 50

## 2018-11-24 MED ORDER — ROCURONIUM BROMIDE 50 MG/5ML IV SOSY
PREFILLED_SYRINGE | INTRAVENOUS | Status: DC | PRN
  Administered 2018-11-24: 30 mg via INTRAVENOUS

## 2018-11-24 MED ORDER — PROPOFOL 10 MG/ML IV BOLUS
INTRAVENOUS | Status: AC
  Filled 2018-11-24: qty 20

## 2018-11-24 MED ORDER — DEXAMETHASONE SODIUM PHOSPHATE 10 MG/ML IJ SOLN
INTRAMUSCULAR | Status: AC
  Filled 2018-11-24: qty 1

## 2018-11-24 MED ORDER — BUPIVACAINE LIPOSOME 1.3 % IJ SUSP
INTRAMUSCULAR | Status: DC | PRN
  Administered 2018-11-24: 20 mL

## 2018-11-24 MED ORDER — GLYCOPYRROLATE PF 0.2 MG/ML IJ SOSY
PREFILLED_SYRINGE | INTRAMUSCULAR | Status: AC
  Filled 2018-11-24: qty 1

## 2018-11-24 MED ORDER — HEMOSTATIC AGENTS (NO CHARGE) OPTIME
TOPICAL | Status: DC | PRN
  Administered 2018-11-24: 1 via TOPICAL

## 2018-11-24 MED ORDER — GLYCOPYRROLATE PF 0.2 MG/ML IJ SOSY
PREFILLED_SYRINGE | INTRAMUSCULAR | Status: DC | PRN
  Administered 2018-11-24: .2 mg via INTRAVENOUS

## 2018-11-24 MED ORDER — PHENYLEPHRINE HCL (PRESSORS) 10 MG/ML IV SOLN
INTRAVENOUS | Status: DC | PRN
  Administered 2018-11-24: 80 ug via INTRAVENOUS

## 2018-11-24 MED ORDER — BUPIVACAINE LIPOSOME 1.3 % IJ SUSP
INTRAMUSCULAR | Status: AC
  Filled 2018-11-24: qty 20

## 2018-11-24 MED ORDER — KETOROLAC TROMETHAMINE 30 MG/ML IJ SOLN
30.0000 mg | Freq: Once | INTRAMUSCULAR | Status: AC
  Administered 2018-11-24: 30 mg via INTRAVENOUS

## 2018-11-24 MED ORDER — ARTIFICIAL TEARS OPHTHALMIC OINT
TOPICAL_OINTMENT | OPHTHALMIC | Status: AC
  Filled 2018-11-24: qty 3.5

## 2018-11-24 MED ORDER — KETOROLAC TROMETHAMINE 30 MG/ML IJ SOLN
INTRAMUSCULAR | Status: AC
  Filled 2018-11-24: qty 1

## 2018-11-24 MED ORDER — LACTATED RINGERS IV SOLN
INTRAVENOUS | Status: DC
  Administered 2018-11-24 (×2): via INTRAVENOUS

## 2018-11-24 MED ORDER — OXYCODONE-ACETAMINOPHEN 7.5-325 MG PO TABS: 1.0000 | ORAL_TABLET | Freq: Four times a day (QID) | ORAL | 0 refills | Status: DC | PRN

## 2018-11-24 MED ORDER — BLOOD GLUCOSE METER KIT: PACK | 0 refills | Status: DC

## 2018-11-24 MED ORDER — PROPOFOL 10 MG/ML IV BOLUS
INTRAVENOUS | Status: AC
  Filled 2018-11-24: qty 40

## 2018-11-24 MED ORDER — FENTANYL CITRATE (PF) 100 MCG/2ML IJ SOLN
INTRAMUSCULAR | Status: DC | PRN
  Administered 2018-11-24: 100 ug via INTRAVENOUS
  Administered 2018-11-24 (×5): 50 ug via INTRAVENOUS

## 2018-11-24 MED ORDER — ONDANSETRON HCL 4 MG/2ML IJ SOLN
INTRAMUSCULAR | Status: AC
  Filled 2018-11-24: qty 2

## 2018-11-24 MED ORDER — ONDANSETRON HCL 4 MG/2ML IJ SOLN
INTRAMUSCULAR | Status: DC | PRN
  Administered 2018-11-24: 4 mg via INTRAVENOUS

## 2018-11-24 MED ORDER — CEFAZOLIN SODIUM-DEXTROSE 2-4 GM/100ML-% IV SOLN
INTRAVENOUS | Status: AC
  Filled 2018-11-24: qty 100

## 2018-11-24 MED ORDER — PROPOFOL 10 MG/ML IV BOLUS
INTRAVENOUS | Status: DC | PRN
  Administered 2018-11-24: 180 mg via INTRAVENOUS

## 2018-11-24 MED ORDER — INSULIN GLARGINE 100 UNIT/ML SOLOSTAR PEN: 10.0000 [IU] | PEN_INJECTOR | Freq: Every day | SUBCUTANEOUS | 0 refills | Status: DC

## 2018-11-24 MED ORDER — AMLODIPINE BESYLATE 10 MG PO TABS: 10.0000 mg | ORAL_TABLET | Freq: Every day | ORAL | 1 refills | Status: AC

## 2018-11-24 MED ORDER — PHENYLEPHRINE 40 MCG/ML (10ML) SYRINGE FOR IV PUSH (FOR BLOOD PRESSURE SUPPORT)
PREFILLED_SYRINGE | INTRAVENOUS | Status: AC
  Filled 2018-11-24: qty 10

## 2018-11-24 MED ORDER — SODIUM CHLORIDE 0.9 % IR SOLN
Status: DC | PRN
  Administered 2018-11-24: 1000 mL

## 2018-11-24 MED ORDER — DEXAMETHASONE SODIUM PHOSPHATE 4 MG/ML IJ SOLN
INTRAMUSCULAR | Status: DC | PRN
  Administered 2018-11-24: 8 mg via INTRAVENOUS

## 2018-11-24 SURGICAL SUPPLY — 51 items
ADH SKN CLS APL DERMABOND .7 (GAUZE/BANDAGES/DRESSINGS) ×1
APL PRP STRL LF DISP 70% ISPRP (MISCELLANEOUS) ×1
APPLIER CLIP ROT 10 11.4 M/L (STAPLE) ×3
APR CLP MED LRG 11.4X10 (STAPLE) ×1
BAG RETRIEVAL 10 (BASKET) ×1
BAG RETRIEVAL 10MM (BASKET) ×1
CHLORAPREP W/TINT 26 (MISCELLANEOUS) ×2 IMPLANT
CLIP APPLIE ROT 10 11.4 M/L (STAPLE) ×1 IMPLANT
CLOTH BEACON ORANGE TIMEOUT ST (SAFETY) ×3 IMPLANT
COVER LIGHT HANDLE STERIS (MISCELLANEOUS) ×6 IMPLANT
DERMABOND ADVANCED (GAUZE/BANDAGES/DRESSINGS) ×2
DERMABOND ADVANCED .7 DNX12 (GAUZE/BANDAGES/DRESSINGS) IMPLANT
ELECT REM PT RETURN 9FT ADLT (ELECTROSURGICAL) ×3
ELECTRODE REM PT RTRN 9FT ADLT (ELECTROSURGICAL) ×1 IMPLANT
FILTER SMOKE EVAC LAPAROSHD (FILTER) ×3 IMPLANT
GLOVE BIO SURGEON STRL SZ 6.5 (GLOVE) ×1 IMPLANT
GLOVE BIO SURGEONS STRL SZ 6.5 (GLOVE) ×1
GLOVE BIOGEL PI IND STRL 6.5 (GLOVE) IMPLANT
GLOVE BIOGEL PI IND STRL 7.0 (GLOVE) ×1 IMPLANT
GLOVE BIOGEL PI INDICATOR 6.5 (GLOVE) ×2
GLOVE BIOGEL PI INDICATOR 7.0 (GLOVE) ×4
GLOVE SURG SS PI 7.5 STRL IVOR (GLOVE) ×3 IMPLANT
GOWN STRL REUS W/TWL LRG LVL3 (GOWN DISPOSABLE) ×9 IMPLANT
HEMOSTAT SNOW SURGICEL 2X4 (HEMOSTASIS) ×3 IMPLANT
INST SET LAPROSCOPIC AP (KITS) ×3 IMPLANT
KIT TURNOVER KIT A (KITS) ×3 IMPLANT
MANIFOLD NEPTUNE II (INSTRUMENTS) ×3 IMPLANT
NDL HYPO 18GX1.5 BLUNT FILL (NEEDLE) ×1 IMPLANT
NDL INSUFFLATION 14GA 120MM (NEEDLE) ×1 IMPLANT
NEEDLE HYPO 18GX1.5 BLUNT FILL (NEEDLE) ×3 IMPLANT
NEEDLE HYPO 22GX1.5 SAFETY (NEEDLE) ×3 IMPLANT
NEEDLE INSUFFLATION 14GA 120MM (NEEDLE) ×3 IMPLANT
NS IRRIG 1000ML POUR BTL (IV SOLUTION) ×3 IMPLANT
PACK LAP CHOLE LZT030E (CUSTOM PROCEDURE TRAY) ×3 IMPLANT
PAD ARMBOARD 7.5X6 YLW CONV (MISCELLANEOUS) ×3 IMPLANT
SET BASIN LINEN APH (SET/KITS/TRAYS/PACK) ×3 IMPLANT
SLEEVE ENDOPATH XCEL 5M (ENDOMECHANICALS) ×3 IMPLANT
SPONGE GAUZE 2X2 8PLY STER LF (GAUZE/BANDAGES/DRESSINGS) ×4
SPONGE GAUZE 2X2 8PLY STRL LF (GAUZE/BANDAGES/DRESSINGS) ×8 IMPLANT
SUT MNCRL AB 4-0 PS2 18 (SUTURE) ×2 IMPLANT
SUT VICRYL 0 UR6 27IN ABS (SUTURE) ×3 IMPLANT
SYR 20CC LL (SYRINGE) ×3 IMPLANT
SYS BAG RETRIEVAL 10MM (BASKET) ×1
SYSTEM BAG RETRIEVAL 10MM (BASKET) ×1 IMPLANT
TROCAR ENDO BLADELESS 11MM (ENDOMECHANICALS) ×3 IMPLANT
TROCAR XCEL NON-BLD 5MMX100MML (ENDOMECHANICALS) ×3 IMPLANT
TROCAR XCEL UNIV SLVE 11M 100M (ENDOMECHANICALS) ×3 IMPLANT
TUBE CONNECTING 12'X1/4 (SUCTIONS) ×1
TUBE CONNECTING 12X1/4 (SUCTIONS) ×2 IMPLANT
TUBING INSUFFLATION (TUBING) ×3 IMPLANT
WARMER LAPAROSCOPE (MISCELLANEOUS) ×3 IMPLANT

## 2018-11-24 NOTE — Discharge Summary (Signed)
Physician Discharge Summary  Kyle Collins NWG:[id number] DOB: February 27, 1986 DOA: 11/20/2018  PCP: Neale Burly, MD  Admit date: 11/20/2018 Discharge date: 11/24/2018  Time spent: 35 minutes  Recommendations for Outpatient Follow-up:  1. Repeat basic metabolic panel to follow electrolytes and renal function 2. Close follow-up the patient CBGs/A1c with further adjustment to hypoglycemic regimen as needed 3. Patient will benefit of close follow-up with bariatric clinic to further assess with weight loss management.   Discharge Diagnoses:  Principal Problem:   DKA (diabetic ketoacidosis) ([facility]) Active Problems:   Essential hypertension, benign   Sleep apnea   GERD (gastroesophageal reflux disease)   Polycythemia   Hyperbilirubinemia   Cholelithiasis   Gastritis   DKA (diabetic ketoacidoses) ([facility])   Abdominal pain   Discharge Condition: Stable and improved.  Patient discharged home with instruction to follow-up with PCP and general surgery as an outpatient.  Diet recommendation: Heart healthy, modified carbohydrates and low calorie diet.  Filed Weights   11/20/18 2045 11/21/18 0102  Weight: (!) 145.2 kg (!) 140 kg    History of present illness:  33 year old male who presented with abdominal pain, vomiting, chestburningand constipation. He does have significant past medical history of ADHD,anxiety, bipolar, depression, GERD, hypertension, sleep apnea, hypothyroidism and type 2 diabetes mellitus.He reported progressive abdominal pain for the last 3 days, epigastric, associated with nausea and vomiting, positive polyuria and polydipsia along with polyphagia and blurry vision.He stopped taking his diabetic medications for the last 3 days.On his initial physical examination his temperature was 98.7, his heart rate 125 bpm, respiratory rate 14, blood pressure 98/63, oxygen saturation 96%. Ill looking appearing, dry mucous membranes, lungs clear to auscultation bilaterally, heart  S1-S2 present, tachycardic, abdomen protuberant, nontender, no lower extremity edema.Sodium 131, potassium 4.4, chloride 90, bicarb 13, glucose 321, BUN 36, creatinine 1.19, anion gap 28,white count 15.6, hemoglobin 18.3, hematocrit 52.7, platelets 435.Urine had more than 500 glucose, negative protein.CT of the abdomen showed no abdominopelvic abnormality, positive cholelithiasis without acute inflammation.His EKG had 123 bpm, with normal axis, normal intervals, sinus rhythm with normal conduction, no ST segment or T wave changes.  Patient was admitted to the hospital with a working diagnosis of diabetes ketoacidosis.  Hospital Course:  1-type 2 diabetes mellitus complicated with DKA -Hemoglobin A1c 9.8 -DKA process resolve at time of discharge after providing treatment with IV fluids and insulin drip. -Patient advised to follow modified carbohydrate diet -Resume metformin and empagliflozin -Will also start patient on Lantus daily for better control on his CBG and future preventions of DKA. -Close follow-up with PCP to further adjust hypoglycemic regimen.  2-gallstone with positive biliary colic -Work-up demonstrated 3 cm gallstone impacted in the gallbladder neck -Patient is status post laparoscopic cholecystectomy -No nausea or vomiting at discharge -Continue outpatient follow-up with general surgery.  3-gastroesophageal reflux disease -Patient will be discharged on PPI -Weight loss has been encouraged.  4-essential hypertension -Blood pressure improved and better control at discharge -Continue the use of lisinopril, HCTZ and adjusted dose of Norvasc -Patient advised to follow heart healthy diet.  5-morbid obesity -Body mass index is 43.05 kg/m. -Low calorie diet, portion control and increase physical activity has been discussed with patient.  6-sleep apnea -Weight loss and outpatient follow-up for repeat sleep study with subsequent decision on CPAP/BIPAP  recommended.  Procedures:  See below for x-ray reports  Status post laparoscopic cholecystectomy on 11/24/2018  Consultations:  General surgery  Discharge Exam: Vitals:   11/24/18 0929 11/24/18 1412  BP: (!) 145/106 Marland Kitchen)  137/95  Pulse: (!) 105 (!) 103  Resp: 18 18  Temp:  98.5 F (36.9 C)  SpO2: 95% 98%   General:  Afebrile, no nausea, no vomiting.  Reporting some abdominal pain and expressing difficulty sitting up after laparoscopic procedure. Neurology: Awake and alert, non focal deficit.  Cardiovascular: No JVD. S1-S2 present, rhythmic, no gallops, rubs, or murmurs. No lower extremity edema. Pulmonary:  positive breath sounds bilaterally, adequate air movement, no wheezing, rhonchi or rales. Gastrointestinal. Abdomen obese, no guarding, positive bowel sounds Skin. No rashes, no petechiae Musculoskeletal: no joint deformities   Discharge Instructions   Discharge Instructions    Diet - low sodium heart healthy   Complete by:  As directed    Diet Carb Modified   Complete by:  As directed    Discharge instructions   Complete by:  As directed    Please follow with primary care in 7 days.   Discharge instructions   Complete by:  As directed    Follow low calorie, heart healthy and modified carbohydrates diet Keep yourself well hydrated Take medications as prescribed and be compliant with regimen. Arrange follow up with PCP in 10 days. Follow up with general surgery as instructed   Increase activity slowly   Complete by:  As directed      Allergies as of 11/24/2018      Reactions   Methylphenidate Derivatives Other (See Comments)   Patient states it made him "act like a zombie" when given as a child      Medication List    STOP taking these medications   chlorthalidone 25 MG tablet Commonly known as:  HYGROTON   oxyCODONE-acetaminophen 5-325 MG tablet Commonly known as:  Percocet Replaced by:  oxyCODONE-acetaminophen 7.5-325 MG tablet    sulfamethoxazole-trimethoprim 800-160 MG tablet Commonly known as:  BACTRIM DS     TAKE these medications   amLODipine 10 MG tablet Commonly known as:  NORVASC Take 1 tablet (10 mg total) by mouth daily. Start taking on:  November 25, 2018 What changed:  Another medication with the same name was removed. Continue taking this medication, and follow the directions you see here.   blood glucose meter kit and supplies Dispense based on patient and insurance preference. Use up to four times daily as directed. (FOR ICD-10 E10.9, E11.9).   blood glucose meter kit and supplies Kit Dispense based on patient and insurance preference. Use up to 2 times daily as directed. (FOR ICD-10 E11.65)   citalopram 20 MG tablet Commonly known as:  CELEXA Take 20 mg by mouth daily.   empagliflozin 10 MG Tabs tablet Commonly known as:  Jardiance Take 10 mg by mouth daily.   hydrochlorothiazide 25 MG tablet Commonly known as:  HYDRODIURIL TAKE ONE TABLET BY MOUTH DAILY.   Insulin Glargine 100 UNIT/ML Solostar Pen Commonly known as:  LANTUS Inject 10 Units into the skin daily for 30 days.   Insulin Pen Needle 31G X 8 MM Misc Commonly known as:  AboutTime Pen Needle Use as needed to inject insulin daily as instructed   lisinopril 40 MG tablet Commonly known as:  ZESTRIL Take 1 tablet (40 mg total) by mouth daily.   metFORMIN 500 MG 24 hr tablet Commonly known as:  GLUCOPHAGE-XR Take 1,000 mg by mouth 2 (two) times daily.   oxyCODONE-acetaminophen 7.5-325 MG tablet Commonly known as:  Percocet Take 1 tablet by mouth every 6 (six) hours as needed. Replaces:  oxyCODONE-acetaminophen 5-325 MG tablet   pantoprazole 40  MG tablet Commonly known as:  PROTONIX Take 1 tablet (40 mg total) by mouth daily.      Allergies  Allergen Reactions  . Methylphenidate Derivatives Other (See Comments)    Patient states it made him "act like a zombie" when given as a child   Follow-up Information     Aviva Signs, MD.   Specialty:  General Surgery Why:  As needed Contact information: [address] [place] [address] [phone]        Neale Burly, MD. Schedule an appointment as soon as possible for a visit in 10 day(s).   Specialty:  Internal Medicine Contact information: [place]  [phone]           The results of significant diagnostics from this hospitalization (including imaging, microbiology, ancillary and laboratory) are listed below for reference.    Significant Diagnostic Studies: Ct Abdomen Pelvis W Contrast  Result Date: 11/20/2018 CLINICAL DATA:  Nausea, vomiting and abdominal pain EXAM: CT ABDOMEN AND PELVIS WITH CONTRAST TECHNIQUE: Multidetector CT imaging of the abdomen and pelvis was performed using the standard protocol following bolus administration of intravenous contrast. CONTRAST:  139m OMNIPAQUE IOHEXOL 300 MG/ML  SOLN COMPARISON:  10/30/2017 FINDINGS: LOWER CHEST: There is no basilar pleural or apical pericardial effusion. HEPATOBILIARY: The hepatic contours and density are normal. There is no intra- or extrahepatic biliary dilatation. There is cholelithiasis without acute inflammation. PANCREAS: The pancreatic parenchymal contours are normal and there is no ductal dilatation. There is no peripancreatic fluid collection. SPLEEN: Normal. ADRENALS/URINARY TRACT: --Adrenal glands: Normal. --Right kidney/ureter: No hydronephrosis, nephroureterolithiasis, perinephric stranding or solid renal mass. --Left kidney/ureter: No hydronephrosis, nephroureterolithiasis, perinephric stranding or solid renal mass. --Urinary bladder: Normal for degree of distention STOMACH/BOWEL: --Stomach/Duodenum: There is no hiatal hernia or other gastric abnormality. The duodenal course and caliber are normal. --Small bowel: No dilatation or inflammation. --Colon: No focal abnormality. --Appendix: Normal. VASCULAR/LYMPHATIC: Normal course and  caliber of the major abdominal vessels. No abdominal or pelvic lymphadenopathy. REPRODUCTIVE: Normal prostate size with symmetric seminal vesicles. MUSCULOSKELETAL. No bony spinal canal stenosis or focal osseous abnormality. OTHER: None. IMPRESSION: 1. No acute abdominopelvic abnormality. 2. Cholelithiasis without acute inflammation. Electronically Signed   By: KUlyses JarredM.D.   On: 11/20/2018 22:17   UKoreaAbdomen Limited Ruq  Result Date: 11/22/2018 CLINICAL DATA:  Follow-up gallstones EXAM: ULTRASOUND ABDOMEN LIMITED RIGHT UPPER QUADRANT COMPARISON:  None. FINDINGS: Gallbladder: Cholelithiasis with the largest measuring 3 cm impacted in the gallbladder neck. Mild gallbladder wall thickening measuring 3 mm. No pericholecystic fluid. Positive sonographic Murphy sign. Common bile duct: Diameter: 5.5 mm Liver: No focal lesion identified. Increased hepatic parenchymal echogenicity. Portal vein is patent on color Doppler imaging with normal direction of blood flow towards the liver. IMPRESSION: 1. Cholelithiasis with 3 cm gallstone impacted in the gallbladder. No definitive evidence of acute cholecystitis. 2. Hepatic steatosis. Electronically Signed   By: HKathreen Devoid  On: 11/22/2018 10:22    Microbiology: Recent Results (from the past 240 hour(s))  MRSA PCR Screening     Status: None   Collection Time: 11/21/18 12:34 AM  Result Value Ref Range Status   MRSA by PCR NEGATIVE NEGATIVE Final    Comment:        The GeneXpert MRSA Assay (FDA approved for NASAL specimens only), is one component of a comprehensive MRSA colonization surveillance program. It is not intended to diagnose MRSA infection nor to guide or monitor treatment for MRSA infections.  Performed at [facility], [address], [place], [place] [address]   Surgical PCR screen     Status: None   Collection Time: 11/23/18  1:46 PM  Result Value Ref Range Status   MRSA, PCR NEGATIVE NEGATIVE Final   Staphylococcus aureus NEGATIVE  NEGATIVE Final    Comment: (NOTE) The Xpert SA Assay (FDA approved for NASAL specimens in patients 60 years of age and older), is one component of a comprehensive surveillance program. It is not intended to diagnose infection nor to guide or monitor treatment. Performed at [facility], [address] [place], [place] [address]      Labs: Basic Metabolic Panel: Recent Labs  Lab 11/20/18 2328  11/21/18 1234 11/21/18 1814 11/22/18 1703 11/23/18 0856 11/24/18 0520  NA 132*   < > 130* 132*  133* 135 133* 136  K 4.3   < > 3.8 4.2  4.5 4.0 3.5 3.8  CL 95*   < > 97* 97*  97* 100 95* 98  CO2 14*   < > 19* 20*  23 20* 26 28  GLUCOSE 213*   < > 219* 207*  209* 165* 193* 178*  BUN 31*   < > '20 18  17 12 12 10  '$ CREATININE 1.10   < > 0.80 0.78  0.79 0.69 0.73 0.72  CALCIUM 8.9   < > 8.9 8.9  9.1 9.1 9.1 9.3  MG 2.3  --   --   --   --   --   --   PHOS 2.9  --   --  1.5*  --   --   --    < > = values in this interval not displayed.   Liver Function Tests: Recent Labs  Lab 11/20/18 2054 11/21/18 0557 11/21/18 1814 11/23/18 0856  AST 20 16  --  16  ALT 29 22  --  22  ALKPHOS 87 68  --  63  BILITOT 2.4* 1.7*  --  2.0*  PROT 8.8* 7.5  --  7.4  ALBUMIN 4.7 3.8 4.0 4.0   Recent Labs  Lab 11/20/18 2054  LIPASE 24   CBC: Recent Labs  Lab 11/20/18 2054 11/21/18 0557 11/23/18 0856  WBC 15.6* 11.3* 8.6  NEUTROABS 13.2*  --  2.7  HGB 18.3* 15.0 16.3  HCT 52.7* 46.0 46.6  MCV 87.4 90.9 87.4  PLT 435* 357 272   Cardiac Enzymes: Recent Labs  Lab 11/21/18 0557 11/21/18 1234  TROPONINI <0.03 <0.03   CBG: Recent Labs  Lab 11/23/18 1735 11/23/18 2109 11/24/18 0658 11/24/18 0933 11/24/18 1111  GLUCAP 189* 180* 196* 263* 267*    Signed:  Barton Dubois MD.  [facility] 11/24/2018, 2:17 PM

## 2018-11-24 NOTE — Progress Notes (Signed)
Inpatient Diabetes Program Recommendations  AACE/ADA: New Consensus Statement on Inpatient Glycemic Control (2015)  Target Ranges:  Prepandial:   less than 140 mg/dL      Peak postprandial:   less than 180 mg/dL (1-2 hours)      Critically ill patients:  140 - 180 mg/dL   Lab Results  Component Value Date   GLUCAP 267 (H) 11/24/2018   HGBA1C 9.8 (H) 11/21/2018    Review of Glycemic Control Results for TYLEE, NEWBY (MRN [id number]) as of 11/24/2018 11:20  Ref. Range 11/24/2018 06:58 11/24/2018 09:33 11/24/2018 11:11  Glucose-Capillary Latest Ref Range: 70 - 99 mg/dL 196 (H) 263 (H) 267 (H)   Diabetes history:Type 2 DM Outpatient Diabetes medications:Metformin 1000 mg BID, Jardiance 10 mg QD Current orders for Inpatient glycemic control:Lantus 10 units QD, Novolog 0-15 units TID, Novolog 0-5 units QHS  Inpatient Diabetes Program Recommendations:  Noted patient in surgery this AM. Has not received Lantus at this time, please ensure administration. Decadron 8 mg x 1 given thus anticipate increase to glucose trends.   Would recommend increasing Lantus to 12 units QD.   Will continue to follow.   Thanks, Bronson Curb, MSN, RNC-OB Diabetes Coordinator [phone] (8a-5p)

## 2018-11-24 NOTE — Op Note (Signed)
Patient:  Kyle Collins  DOB:  03/15/1986  MRN:  [id number]   Preop Diagnosis: Acute cholecystitis, cholelithiasis  Postop Diagnosis: Same  Procedure: Laparoscopic cholecystectomy  Surgeon: Aviva Signs, MD  Assistant: Curlene Labrum, MD  Anes: General endotracheal  Indications: Patient is a 33 year old morbidly obese diabetic white male who presented to anything hospital with worsening right upper quadrant abdominal pain, nausea, and vomiting.  Ultrasound gallbladder reveals cholelithiasis.  The patient now presents for laparoscopic cholecystectomy.  The risks and benefits of the procedure including bleeding, infection, hepatobiliary injury, and the possibility of an open procedure were fully explained to the patient, who gave informed consent.  Procedure note: The patient was placed in the supine position.  After induction of general endotracheal anesthesia, the abdomen was prepped and draped using the usual sterile technique with ChloraPrep.  Surgical site confirmation was performed.  A supraumbilical incision was made down to the fascia.  A Veress needle was introduced into the abdominal cavity and confirmation of placement was done using the saline drop test.  The abdomen was then insufflated to 17 mmHg pressure.  An 11 mm trocar was introduced into the abdominal cavity under direct visualization without difficulty.  The patient was placed in reverse Trendelenburg position and an additional 11 mm trocar was placed in the epigastric region and 5 mm trochars were placed the right upper quadrant and right flank regions.  The liver was inspected and noted to be within normal limits.  The gallbladder was retracted in a dynamic fashion in order to provide a critical view of the triangle of Calot.  The cystic duct was first identified.  Its juncture to the infundibulum was fully identified.  Endoclips were placed proximally and distally on the cystic duct, and the cystic duct was divided.  This  was likewise done to the cystic artery.  The gallbladder was then freed away from the gallbladder fossa using Bovie electrocautery.  The gallbladder was delivered through the epigastric trocar site using an Endo Catch bag.  The gallbladder fossa was inspected no abnormal bleeding or bile leakage was noted.  Surgicel was placed in the gallbladder fossa.  All fluid and air were then evacuated from the abdominal cavity prior to the removal of the trochars.  All wounds were irrigated with normal saline.  All wounds were injected with Exparel.  The epigastric fascia was reapproximated using an 0 Vicryl interrupted suture.  All skin incisions were closed using 4-0 Monocryl subcuticular sutures.  Dermabond was applied.  All tape and needle counts were correct at the end of the procedure.  The patient was extubated in the operating room and transferred to PACU in stable condition.  Complications: None  EBL: Minimal  Specimen: Gallbladder

## 2018-11-24 NOTE — Anesthesia Postprocedure Evaluation (Signed)
Anesthesia Post Note  Patient: Kyle Collins  Procedure(s) Performed: LAPAROSCOPIC CHOLECYSTECTOMY (N/A Abdomen)  Patient location during evaluation: Other Anesthesia Type: General Level of consciousness: awake and alert and oriented Pain management: pain level controlled Vital Signs Assessment: post-procedure vital signs reviewed and stable Respiratory status: spontaneous breathing Cardiovascular status: stable Postop Assessment: no apparent nausea or vomiting Anesthetic complications: no     Last Vitals:  Vitals:   11/24/18 0909 11/24/18 0913  BP:    Pulse: 90   Resp: 14   Temp:    SpO2: 100% 95%    Last Pain:  Vitals:   11/24/18 0909  TempSrc:   PainSc: 5                  Amorie Rentz A

## 2018-11-24 NOTE — Interval H&P Note (Signed)
History and Physical Interval Note:  11/24/2018 7:17 AM  Kyle Collins  has presented today for surgery, with the diagnosis of acute cholecystitis, cholelithiasis.  The various methods of treatment have been discussed with the patient and family. After consideration of risks, benefits and other options for treatment, the patient has consented to  Procedure(s): LAPAROSCOPIC CHOLECYSTECTOMY (N/A) as a surgical intervention.  The patient's history has been reviewed, patient examined, no change in status, stable for surgery.  I have reviewed the patient's chart and labs.  Questions were answered to the patient's satisfaction.     Aviva Signs

## 2018-11-24 NOTE — Transfer of Care (Signed)
Immediate Anesthesia Transfer of Care Note  Patient: Kyle Collins  Procedure(s) Performed: LAPAROSCOPIC CHOLECYSTECTOMY (N/A Abdomen)  Patient Location: PACU  Anesthesia Type:General  Level of Consciousness: awake, alert , oriented and patient cooperative  Airway & Oxygen Therapy: Patient Spontanous Breathing  Post-op Assessment: Report given to RN and Post -op Vital signs reviewed and stable  Post vital signs: Reviewed and stable  Last Vitals:  Vitals Value Taken Time  BP 150/82 11/24/2018  8:36 AM  Temp 36.9 C 11/24/2018  8:36 AM  Pulse 106 11/24/2018  8:44 AM  Resp 16 11/24/2018  8:44 AM  SpO2 92 % 11/24/2018  8:44 AM  Vitals shown include unvalidated device data.  Last Pain:  Vitals:   11/24/18 0844  TempSrc:   PainSc: 10-Worst pain ever      Patients Stated Pain Goal: 4 (56/70/14 1030)  Complications: No apparent anesthesia complications

## 2018-11-24 NOTE — Anesthesia Procedure Notes (Signed)
Procedure Name: Intubation Date/Time: 11/24/2018 7:33 AM Performed by: Andree Elk, Estefana Taylor A, CRNA Pre-anesthesia Checklist: Patient identified, Patient being monitored, Timeout performed, Emergency Drugs available and Suction available Patient Re-evaluated:Patient Re-evaluated prior to induction Oxygen Delivery Method: Circle system utilized Preoxygenation: Pre-oxygenation with 100% oxygen Induction Type: IV induction, Rapid sequence and Cricoid Pressure applied Laryngoscope Size: 3 and Glidescope Grade View: Grade I Tube type: Oral Tube size: 7.0 mm Number of attempts: 1 Airway Equipment and Method: Stylet and Video-laryngoscopy Placement Confirmation: ETT inserted through vocal cords under direct vision,  positive ETCO2 and breath sounds checked- equal and bilateral Secured at: 21 cm Tube secured with: Tape Dental Injury: Teeth and Oropharynx as per pre-operative assessment

## 2018-11-24 NOTE — Anesthesia Preprocedure Evaluation (Signed)
Anesthesia Evaluation  Patient identified by MRN, date of birth, ID band Patient awake    Reviewed: Allergy & Precautions, NPO status , Patient's Chart, lab work & pertinent test results  Airway Mallampati: I  TM Distance: >3 FB Neck ROM: Full    Dental no notable dental hx.    Pulmonary sleep apnea and Continuous Positive Airway Pressure Ventilation , former smoker,    Pulmonary exam normal breath sounds clear to auscultation       Cardiovascular Exercise Tolerance: Good hypertension, Pt. on medications negative cardio ROS Normal cardiovascular examI Rhythm:Regular Rate:Normal     Neuro/Psych  Headaches, Anxiety Depression Bipolar Disorder negative psych ROS   GI/Hepatic Neg liver ROS, GERD  Medicated and Controlled,  Endo/Other  diabetes, Well Controlled, Type 1, Insulin Dependent, Oral Hypoglycemic AgentsMorbid obesity  Renal/GU negative Renal ROS  negative genitourinary   Musculoskeletal negative musculoskeletal ROS (+)   Abdominal   Peds negative pediatric ROS (+)  Hematology negative hematology ROS (+)   Anesthesia Other Findings   Reproductive/Obstetrics negative OB ROS                             Anesthesia Physical Anesthesia Plan  ASA: III  Anesthesia Plan: General   Post-op Pain Management:    Induction: Intravenous  PONV Risk Score and Plan:   Airway Management Planned: Oral ETT  Additional Equipment:   Intra-op Plan:   Post-operative Plan: Extubation in OR  Informed Consent: I have reviewed the patients History and Physical, chart, labs and discussed the procedure including the risks, benefits and alternatives for the proposed anesthesia with the patient or authorized representative who has indicated his/her understanding and acceptance.     Dental advisory given  Plan Discussed with: CRNA  Anesthesia Plan Comments: (Full PPE use planned  GETA planned   Dr. Arnoldo Morale deemed case appropriate in light of current COVID concerns )        Anesthesia Quick Evaluation

## 2018-11-24 NOTE — Discharge Instructions (Signed)
Laparoscopic Cholecystectomy, Care After This sheet gives you information about how to care for yourself after your procedure. Your health care provider may also give you more specific instructions. If you have problems or questions, contact your health care provider. What can I expect after the procedure? After the procedure, it is common to have:  Pain at your incision sites. You will be given medicines to control this pain.  Mild nausea or vomiting.  Bloating and possible shoulder pain from the air-like gas that was used during the procedure. Follow these instructions at home: Incision care   Follow instructions from your health care provider about how to take care of your incisions. Make sure you: ? Wash your hands with soap and water before you change your bandage (dressing). If soap and water are not available, use hand sanitizer. ? Change your dressing as told by your health care provider. ? Leave stitches (sutures), skin glue, or adhesive strips in place. These skin closures may need to be in place for 2 weeks or longer. If adhesive strip edges start to loosen and curl up, you may trim the loose edges. Do not remove adhesive strips completely unless your health care provider tells you to do that.  Do not take baths, swim, or use a hot tub until your health care provider approves. Ask your health care provider if you can take showers. You may only be allowed to take sponge baths for bathing.  Check your incision area every day for signs of infection. Check for: ? More redness, swelling, or pain. ? More fluid or blood. ? Warmth. ? Pus or a bad smell. Activity  Do not drive or use heavy machinery while taking prescription pain medicine.  Do not lift anything that is heavier than 10 lb (4.5 kg) until your health care provider approves.  Do not play contact sports until your health care provider approves.  Do not drive for 24 hours if you were given a medicine to help you relax  (sedative).  Rest as needed. Do not return to work or school until your health care provider approves. General instructions  Take over-the-counter and prescription medicines only as told by your health care provider.  To prevent or treat constipation while you are taking prescription pain medicine, your health care provider may recommend that you: ? Drink enough fluid to keep your urine clear or pale yellow. ? Take over-the-counter or prescription medicines. ? Eat foods that are high in fiber, such as fresh fruits and vegetables, whole grains, and beans. ? Limit foods that are high in fat and processed sugars, such as fried and sweet foods. Contact a health care provider if:  You develop a rash.  You have more redness, swelling, or pain around your incisions.  You have more fluid or blood coming from your incisions.  Your incisions feel warm to the touch.  You have pus or a bad smell coming from your incisions.  You have a fever.  One or more of your incisions breaks open. Get help right away if:  You have trouble breathing.  You have chest pain.  You have increasing pain in your shoulders.  You faint or feel dizzy when you stand.  You have severe pain in your abdomen.  You have nausea or vomiting that lasts for more than one day.  You have leg pain. This information is not intended to replace advice given to you by your health care provider. Make sure you discuss any questions you have  with your health care provider. Document Released: 07/14/2005 Document Revised: 02/02/2016 Document Reviewed: 12/31/2015 Elsevier Interactive Patient Education  2019 [employer]. -Make an appointment to reschedule with PCP and consider an endocrinologist. - Check blood sugars 2-3 times per day 1. Once in AM (before eating/drinking) 2. 2 Hours after a big meal - Take medication as prescribed. -Call Primary MD if having >1 low blood sugar per week, or if blood sugar continues to be  >180 mg/dL.  Local Endocrinologists [facility] [phone]) 1. Dr. Philemon Kingdom 2. Dr. Jan[facility] [phone]) 1. Dr. Delrae Rend [facility] [phone]) 1. Dr. Jacelyn Pi 2. Dr. Anda Kraft [facility] [phone]) 1. Dr. Daneil Dolin [facility] [phone]) [[place] office]  [phone]) [[place] office] 1. Dr. Lenna Sciara [place] 2. Dr. Judithann Sheen [facility]) [phone]) 1. Autumn Hudnall Ronnald Ramp), PA 2. Dr. Amalia Greenhouse 3. Dr. Marsh Dolly.    Insulin Injection Instructions, Using Insulin Pens, Adult A subcutaneous injection is a shot of medicine that is injected into the layer of fat and tissue between skin and muscle. People with type 1 diabetes must take insulin because their bodies do not make it. People with type 2 diabetes may need to take insulin. There are many different types of insulin. The type of insulin that you take may determine how many injections you give yourself and when you need to give the injections. Supplies needed:  Soap and water to wash hands.  Your insulin pen.  A new, unused needle.  Alcohol wipes.  A disposal container that is meant for sharp items (sharps container), such as an empty plastic bottle with a cover. How to choose a site for injection The body absorbs insulin differently, depending on where the insulin is injected (injection site). It is best to inject insulin into the same body area each time (for example, always in the abdomen), but you should use a different spot in that area for each injection. Do not inject the insulin in the same spot each time. There are five main areas that can be used for injecting. These areas include:  Abdomen. This is the preferred area.  Front of thigh.  Upper, outer side of thigh.  Upper, outer side of arm.  Upper, outer part of buttock. How to use  an insulin pen  First, follow the steps for Get ready, then continue with the steps for Inject the insulin. Get ready 1. Wash your hands with soap and water. If soap and water are not available, use hand sanitizer. 2. Before you give yourself an insulin injection, be sure to test your blood sugar level (blood glucose level) and write down that number. Follow any instructions from your health care provider about what to do if your blood glucose level is higher or lower than your normal range. 3. Check the expiration date and the type of insulin that is in the pen. 4. If you are using CLEAR insulin, check to see that it is clear and free of clumps. 5. If you are using CLOUDY insulin, do not shake the pen to get the injection ready. Instead, get it ready in one of these ways: ? Gently roll the pen between your palms several times. ? Tip the pen up and down several times. 6. Remove the cap from the insulin pen. 7. Use an alcohol wipe to clean the rubber tip of the pen. 8. Remove the protective paper tab from the disposable needle. Do not  let the needle touch anything. 9. Screw a new, unused needle onto the pen. 10. Remove the outer plastic needle cover. Do not throw away the outer plastic cover yet. ? If the pen uses a special safety needle, leave the inner needle shield in place. ? If the pen does not use a special safety needle, remove the inner plastic cover from the needle. 11. Follow the manufacturer's instructions to prime the insulin pen with the volume of insulin needed. Hold the pen with the needle pointing up, and push the button on the opposite end of the pen until a drop of insulin appears at the needle tip. If no insulin appears, repeat this step. 12. Turn the button (dial) to the number of units of insulin that you will be injecting. Inject the insulin 1. Use an alcohol wipe to clean the site where you will be injecting the needle. Let the site air-dry. 2. Hold the pen in the palm of  your writing hand like a pencil. 3. If directed by your health care provider, use your other hand to pinch and hold about an inch (2.5 cm) of skin at the injection site. Do not directly touch the cleaned part of the skin. 4. Gently but quickly, use your writing hand to put the needle straight into the skin. The needle should be at a 90-degree angle (perpendicular) to the skin. 5. When the needle is completely inserted into the skin, use your thumb or index finger of your writing hand to push the top button of the pen down all the way to inject the insulin. 6. Let go of the skin that you are pinching. Continue to hold the pen in place with your writing hand. 7. Wait 10 seconds, then pull the needle straight out of the skin. This will allow all of the insulin to go from the pen and needle into your body. 8. Carefully put the larger (outer) plastic cover of the needle back over the needle, then unscrew the capped needle and discard it in a sharps container, such as an empty plastic bottle with a cover. 9. Put the plastic cap back on the insulin pen. How to throw away supplies  Discard all used needles in a puncture-proof sharps disposal container. You can ask your local pharmacy about where you can get this kind of disposal container, or you can use an empty plastic liquid laundry detergent bottle that has a cover.  Follow the disposal regulations for the area where you live. Do not use any needle more than one time.  Throw away empty disposable pens in the regular trash. Questions to ask your health care provider  How often should I be taking insulin?  How often should I check my blood glucose?  What amount of insulin should I be taking at each time?  What are the side effects?  What should I do if my blood glucose is too high?  What should I do if my blood glucose is too low?  What should I do if I forget to take my insulin?  What number should I call if I have questions? Where to find  more information  American Diabetes Association (ADA): www.diabetes.org  American Association of Diabetes Educators (AADE) Patient Resources: https://www.diabeteseducator.org Summary  A subcutaneous injection is a shot of medicine that is injected into the layer of fat and tissue between skin and muscle.  Before you give yourself an insulin injection, be sure to test your blood sugar level (blood glucose level)  and write down that number.  Check the expiration date and the type of insulin that is in the pen. The type of insulin that you take may determine how many injections you give yourself and when you need to give the injections.  It is best to inject insulin into the same body area each time (for example, always in the abdomen), but you should use a different spot in that area for each injection. This information is not intended to replace advice given to you by your health care provider. Make sure you discuss any questions you have with your health care provider. Document Released: 08/17/2015 Document Revised: 08/03/2017 Document Reviewed: 08/17/2015 Elsevier Interactive Patient Education  2019 [place]. Insulin Injection Instructions, Single Insulin Dose, Adult A subcutaneous injection is a shot of medicine that is injected into the layer of fat and tissue between skin and muscle. People with type 1 diabetes must take insulin because their bodies do not make it. People with type 2 diabetes may need to take insulin.  There are many different types of insulin. The type of insulin that you take may determine how many injections you give yourself and when you need to give the injections. Supplies needed:  Soap and water to wash hands.  A new, unused insulin syringe.  Your insulin medication bottle (vial).  Alcohol wipes.  A disposal container that is meant for sharp items (sharps container), such as an empty plastic bottle with a cover. How to choose a site for injection The  body absorbs insulin differently, depending on where the insulin is injected (injection site). It is best to inject insulin into the same body area each time (for example, always in the abdomen), but you should use a different spot in that area for each injection. Do not inject the insulin in the same spot each time. There are five main areas that can be used for injecting. These areas include:  Abdomen. This is the preferred area.  Front of thigh.  Upper, outer side of thigh.  Upper, outer side of arm.  Upper, outer part of buttock. How to give a single-dose insulin injection First, follow the steps for Get ready, then continue with the steps for Push air into the vial, then follow the steps for Fill the syringe, and finish with the steps for Inject the insulin. Get ready 1. Wash your hands with soap and water. If soap and water are not available, use hand sanitizer. 2. Before you give yourself an insulin injection, be sure to test your blood sugar level (blood glucose level) and write down that number. Follow any instructions from your health care provider about what to do if your blood glucose level is higher or lower than your normal range. 3. Use a new, unused insulin syringe each time you need to inject insulin. 4. Check to make sure you have the correct type of insulin syringe for the concentration of insulin that you are using. 5. Check the expiration date and the type of insulin that you are using. 6. If you are using CLEAR insulin, check to see that it is clear and free of clumps. 7. Do not shake the vial to get it ready. Gently roll the vial between your palms several times. 8. Remove the plastic pop-top covering from the vial of insulin. This type of covering is present on a vial when it is new. 9. Use an alcohol wipe to clean the rubber top of the vial. 10. Remove the plastic cover  from the syringe needle. Do not let the needle touch anything. Push air into the vial 10. To bring  (draw up) air into the syringe, slowly pull back on the syringe plunger. Stop pulling the plunger when the dose indicator gets to the number of units that you will be using. 11. While you keep the vial right-side-up, poke the needle through the rubber top of the vial. Do not turn the vial upside down to do this. 12. Push the plunger all the way into the syringe. Doing that will push air into the vial. 13. Do not take the needle out of the vial yet. Fill the syringe  1. While the needle is still in the vial, turn the vial upside down and hold it at eye level. 2. Slowly pull back on the plunger. Stop pulling the plunger when the dose indicator gets to the desired number of units. 3. If you see air bubbles in the syringe, slowly move the plunger up and down 2 or 3 times to make them go away. ? If you had to move the plunger to get rid of air bubbles, pull back the plunger until the dose indicator returns to the correct dose. 4. Remove the needle from the vial. Do not let the needle touch anything. Inject the insulin  1. Use an alcohol wipe to clean the site where you will be injecting the needle. Let the site air-dry. 2. Hold the syringe in your writing hand like a pencil. 3. Use your other hand to pinch and hold about an inch (2.5 cm) of skin. Do not directly touch the cleaned part of the skin. 4. Gently but quickly, put the needle straight into the skin. The needle should be at a 90-degree angle (perpendicular) to the skin. 5. Push the needle in as far as it will go (to the hub). 6. When the needle is completely inserted into the skin, use your thumb or index finger of your writing hand to push the plunger all the way into the syringe to inject the insulin. 7. Let go of the skin that you are pinching. Continue to hold the syringe in place with your writing hand. 8. Wait 10 seconds, then pull the needle straight out of the skin. This will allow all of the insulin to go from the syringe and needle  into your body. 9. Press and hold the alcohol wipe over the injection site until any bleeding stops. Do not rub the area. 10. Do not put the plastic cover back on the needle. 11. Discard the syringe and needle directly into a sharps container, such as an empty plastic bottle with a cover. How to throw away supplies  Discard all used needles in a puncture-proof sharps disposal container. You can ask your local pharmacy about where you can get this kind of disposal container, or you can use an empty plastic liquid laundry detergent bottle that has a cover.  Follow the disposal regulations for the area where you live. Do not use any syringe or needle more than one time.  Throw away empty vials in the regular trash. Questions to ask your health care provider  How often should I be taking insulin?  How often should I check my blood glucose?  What amount of insulin should I be taking at each time?  What are the side effects?  What should I do if my blood glucose is too high?  What should I do if my blood glucose is too low?  What should I do if I forget to take my insulin?  What number should I call if I have questions? Where to find more information  American Diabetes Association (ADA): www.diabetes.org  American Association of Diabetes Educators (AADE) Patient Resources: https://www.diabeteseducator.org Summary  A subcutaneous injection is a shot of medicine that is injected into the layer of fat and tissue between skin and muscle.  Before you give yourself an insulin injection, be sure to test your blood sugar level (blood glucose level) and write down that number.  The type of insulin that you take may determine how many injections you give yourself and when you need to give the injections.  Check the expiration date and the type of insulin that you are using.  It is best to inject insulin into the same body area each time (for example, always in the abdomen), but you should  use a different spot in that area for each injection. This information is not intended to replace advice given to you by your health care provider. Make sure you discuss any questions you have with your health care provider. Document Released: 08/17/2015 Document Revised: 02/04/2017 Document Reviewed: 08/17/2015 Elsevier Interactive Patient Education  2019 [employer]. Diabetic Ketoacidosis Diabetic ketoacidosis is a serious complication of diabetes. This condition develops when there is not enough insulin in the body. Insulin is an hormone that regulates blood sugar levels in the body. Normally, insulin allows glucose to enter the cells in the body. The cells break down glucose for energy. Without enough insulin, the body cannot break down glucose, so it breaks down fats instead. This leads to high blood glucose levels in the body and the production of acids that are called ketones. Ketones are poisonous at high levels. If diabetic ketoacidosis is not treated, it can cause severe dehydration and can lead to a coma or death. What are the causes? This condition develops when a lack of insulin causes the body to break down fats instead of glucose. This may be triggered by:  Stress on the body. This stress is brought on by an illness.  Infection.  Medicines that raise blood glucose levels.  Not taking diabetes medicine.  New onset of type 1 diabetes mellitus. What are the signs or symptoms? Symptoms of this condition include:  Fatigue.  Weight loss.  Excessive thirst.  Light-headedness.  Fruity or sweet-smelling breath.  Excessive urination.  Vision changes.  Confusion or irritability.  Nausea.  Vomiting.  Rapid breathing.  Abdominal pain.  Feeling flushed. How is this diagnosed? This condition is diagnosed based on your medical history, a physical exam, and blood tests. You may also have a urine test to check for ketones. How is this treated? This condition may be  treated with:  Fluid replacement. This may be done to correct dehydration.  Insulin injections. These may be given through the skin or through an IV tube.  Electrolyte replacement. Electrolytes are minerals in your blood. Electrolytes such as potassium and sodium may be given in pill form or through an IV tube.  Antibiotic medicines. These may be prescribed if your condition was caused by an infection. Diabetic ketoacidosis is a serious medical condition. You may need emergency treatment in the hospital to monitor your condition. Follow these instructions at home: Eating and drinking  Drink enough fluids to keep your urine clear or pale yellow.  If you are not able to eat, drink clear fluids in small amounts as you are able. Clear fluids include water, ice chips,  fruit juice with water added (diluted), and low-calorie sports drinks. You may also have sugar-free jello or popsicles.  If you are able to eat, follow your usual diet and drink sugar-free liquids, such as water. Medicines  Take over-the-counter and prescription medicines only as told by your health care provider.  Continue to take insulin and other diabetes medicines as told by your health care provider.  If you were prescribed an antibiotic, take it as told by your health care provider. Do not stop taking the antibiotic even if you start to feel better. General instructions   Check your urine for ketones when you are ill and as told by your health care provider. ? If your blood glucose is 240 mg/dL (13.3 mmol/L) or higher, check your urine ketones every 4-6 hours.  Check your blood glucose every day, as often as told by your health care provider. ? If your blood glucose is high, drink plenty of fluids. This helps to flush out ketones. ? If your blood glucose is above your target for 2 tests in a row, contact your health care provider.  Carry a medical alert card or wear medical alert jewelry that says that you have  diabetes.  Rest and exercise only as told by your health care provider. Do not exercise when your blood glucose is high and you have ketones in your urine.  If you get sick, call your health care provider and begin treatment quickly. Your body often needs extra insulin to fight an illness. Check your blood glucose every 4-6 hours when you are sick.  Keep all follow-up visits as told by your health care provider. This is important. Contact a health care provider if:  Your blood glucose level is higher than 240 mg/dL (13.3 mmol/L) for 2 days in a row.  You have moderate or large ketones in your urine.  You have a fever.  You cannot eat or drink without vomiting.  You have been vomiting for more than 2 hours.  You continue to have symptoms of diabetic ketoacidosis.  You develop new symptoms. Get help right away if:  Your blood glucose monitor reads high even when you are taking insulin.  You faint.  You have chest pain.  You have trouble breathing.  You have sudden trouble speaking or swallowing.  You have vomiting or diarrhea that gets worse after 3 hours.  You are unable to stay awake.  You have trouble thinking.  You are severely dehydrated. Symptoms of severe dehydration include: ? Extreme thirst. ? Dry mouth. ? Rapid breathing. These symptoms may represent a serious problem that is an emergency. Do not wait to see if the symptoms will go away. Get medical help right away. Call your local emergency services (911 in the [place] Do not drive yourself to the hospital. Summary  Diabetic ketoacidosis is a serious complication of diabetes. This condition develops when there is not enough insulin in the body.  This condition is diagnosed based on your medical history, a physical exam, and blood tests. You may also have a urine test to check for ketones.  Diabetic ketoacidosis is a serious medical condition. You may need emergency treatment in the hospital to monitor your  condition.  Contact your health care provider if your blood glucose is higher than 240 mg/dl for 2 days in a row or if you have moderate or large ketones in your urine. This information is not intended to replace advice given to you by your health care provider. Make  sure you discuss any questions you have with your health care provider. Document Released: 07/11/2000 Document Revised: 08/18/2016 Document Reviewed: 08/18/2016 Elsevier Interactive Patient Education  2019 [place]. Hypoglycemia Hypoglycemia occurs when the level of sugar (glucose) in the blood is too low. Hypoglycemia can happen in people who do or do not have diabetes. It can develop quickly, and it can be a medical emergency. For most people with diabetes, a blood glucose level below 70 mg/dL (3.9 mmol/L) is considered hypoglycemia. Glucose is a type of sugar that provides the body's main source of energy. Certain hormones (insulin and glucagon) control the level of glucose in the blood. Insulin lowers blood glucose, and glucagon raises blood glucose. Hypoglycemia can result from having too much insulin in the bloodstream, or from not eating enough food that contains glucose. You may also have reactive hypoglycemia, which happens within 4 hours after eating a meal. What are the causes? Hypoglycemia occurs most often in people who have diabetes and may be caused by:  Diabetes medicine.  Not eating enough, or not eating often enough.  Increased physical activity.  Drinking alcohol on an empty stomach. If you do not have diabetes, hypoglycemia may be caused by:  A tumor in the pancreas.  Not eating enough, or not eating for Yglesias periods at a time (fasting).  A severe infection or illness.  Certain medicines. What increases the risk? Hypoglycemia is more likely to develop in:  People who have diabetes and take medicines to lower blood glucose.  People who abuse alcohol.  People who have a severe illness. What are  the signs or symptoms? Mild symptoms Mild hypoglycemia may not cause any symptoms. If you do have symptoms, they may include:  Hunger.  Anxiety.  Sweating and feeling clammy.  Dizziness or feeling light-headed.  Sleepiness.  Nausea.  Increased heart rate.  Headache.  Blurry vision.  Irritability.  Tingling or numbness around the mouth, lips, or tongue.  A change in coordination.  Restless sleep. Moderate symptoms Moderate hypoglycemia can cause:  Mental confusion and poor judgment.  Behavior changes.  Weakness.  Irregular heartbeat. Severe symptoms Severe hypoglycemia is a medical emergency. It can cause:  Fainting.  Seizures.  Loss of consciousness (coma).  Death. How is this diagnosed? Hypoglycemia is diagnosed with a blood test to measure your blood glucose level. This blood test is done while you are having symptoms. Your health care provider may also do a physical exam and review your medical history. How is this treated? This condition can often be treated by immediately eating or drinking something that contains sugar, such as:  Fruit juice, 4-6 oz (120-150 mL).  Regular soda (not diet soda), 4-6 oz (120-150 mL).  Low-fat milk, 4 oz (120 mL).  Several pieces of hard candy.  Sugar or honey, 1 Tbsp (15 mL). Treating hypoglycemia if you have diabetes If you are alert and able to swallow safely, follow the 15:15 rule:  Take 15 grams of a rapid-acting carbohydrate. Talk with your health care provider about how much you should take.  Rapid-acting options include: ? Glucose pills (take 15 grams). ? 6-8 pieces of hard candy. ? 4-6 oz (120-150 mL) of fruit juice. ? 4-6 oz (120-150 mL) of regular (not diet) soda. ? 1 Tbsp (15 mL) honey or sugar.  Check your blood glucose 15 minutes after you take the carbohydrate.  If the repeat blood glucose level is still at or below 70 mg/dL (3.9 mmol/L), take 15 grams of a carbohydrate  again.  If your  blood glucose level does not increase above 70 mg/dL (3.9 mmol/L) after 3 tries, seek emergency medical care.  After your blood glucose level returns to normal, eat a meal or a snack within 1 hour.  Treating severe hypoglycemia Severe hypoglycemia is when your blood glucose level is at or below 54 mg/dL (3 mmol/L). Severe hypoglycemia is a medical emergency. Get medical help right away. If you have severe hypoglycemia and you cannot eat or drink, you may need an injection of glucagon. A family member or close friend should learn how to check your blood glucose and how to give you a glucagon injection. Ask your health care provider if you need to have an emergency glucagon injection kit available. Severe hypoglycemia may need to be treated in a hospital. The treatment may include getting glucose through an IV. You may also need treatment for the cause of your hypoglycemia. Follow these instructions at home:  General instructions  Take over-the-counter and prescription medicines only as told by your health care provider.  Monitor your blood glucose as told by your health care provider.  Limit alcohol intake to no more than 1 drink a day for nonpregnant women and 2 drinks a day for men. One drink equals 12 oz of beer (355 mL), 5 oz of wine (148 mL), or 1 oz of hard liquor (44 mL).  Keep all follow-up visits as told by your health care provider. This is important. If you have diabetes:  Always have a rapid-acting carbohydrate snack with you to treat low blood glucose.  Follow your diabetes management plan as directed. Make sure you: ? Know the symptoms of hypoglycemia. It is important to treat it right away to prevent it from becoming severe. ? Take your medicines as directed. ? Follow your exercise plan. ? Follow your meal plan. Eat on time, and do not skip meals. ? Check your blood glucose as often as directed. Always check before and after exercise. ? Follow your sick day plan whenever  you cannot eat or drink normally. Make this plan in advance with your health care provider.  Share your diabetes management plan with people in your workplace, school, and household.  Check your urine for ketones when you are ill and as told by your health care provider.  Carry a medical alert card or wear medical alert jewelry. Contact a health care provider if:  You have problems keeping your blood glucose in your target range.  You have frequent episodes of hypoglycemia. Get help right away if:  You continue to have hypoglycemia symptoms after eating or drinking something containing glucose.  Your blood glucose is at or below 54 mg/dL (3 mmol/L).  You have a seizure.  You faint. These symptoms may represent a serious problem that is an emergency. Do not wait to see if the symptoms will go away. Get medical help right away. Call your local emergency services (911 in the [place] Summary  Hypoglycemia occurs when the level of sugar (glucose) in the blood is too low.  Hypoglycemia can happen in people who do or do not have diabetes. It can develop quickly, and it can be a medical emergency.  Make sure you know the symptoms of hypoglycemia and how to treat it.  Always have a rapid-acting carbohydrate snack with you to treat low blood sugar. This information is not intended to replace advice given to you by your health care provider. Make sure you discuss any questions you have with  your health care provider. Document Released: 07/14/2005 Document Revised: 01/05/2018 Document Reviewed: 08/17/2015 Elsevier Interactive Patient Education  2019 [place]. Hyperglycemia Hyperglycemia occurs when the level of sugar (glucose) in the blood is too high. Glucose is a type of sugar that provides the body's main source of energy. Certain hormones (insulin and glucagon) control the level of glucose in the blood. Insulin lowers blood glucose, and glucagon increases blood glucose. Hyperglycemia  can result from having too little insulin in the bloodstream, or from the body not responding normally to insulin. Hyperglycemia occurs most often in people who have diabetes (diabetes mellitus), but it can happen in people who do not have diabetes. It can develop quickly, and it can be life-threatening if it causes you to become severely dehydrated (diabetic ketoacidosis or hyperglycemic hyperosmolar state). Severe hyperglycemia is a medical emergency. What are the causes? If you have diabetes, hyperglycemia may be caused by:  Diabetes medicine.  Medicines that increase blood glucose or affect your diabetes control.  Not eating enough, or not eating often enough.  Changes in physical activity level.  Being sick or having an infection. If you have prediabetes or undiagnosed diabetes:  Hyperglycemia may be caused by those conditions. If you do not have diabetes, hyperglycemia may be caused by:  Certain medicines, including steroid medicines, beta-blockers, epinephrine, and thiazide diuretics.  Stress.  Serious illness.  Surgery.  Diseases of the pancreas.  Infection. What increases the risk? Hyperglycemia is more likely to develop in people who have risk factors for diabetes, such as:  Having a family member with diabetes.  Having a gene for type 1 diabetes that is passed from parent to child (inherited).  Living in an area with cold weather conditions.  Exposure to certain viruses.  Certain conditions in which the body's disease-fighting (immune) system attacks itself (autoimmune disorders).  Being overweight or obese.  Having an inactive (sedentary) lifestyle.  Having been diagnosed with insulin resistance.  Having a history of prediabetes, gestational diabetes, or polycystic ovarian syndrome (PCOS).  Being of American-Indian, African-American, Hispanic/Latino, or Asian/Pacific Islander descent. What are the signs or symptoms? Hyperglycemia may not cause any  symptoms. If you do have symptoms, they may include early warning signs, such as:  Increased thirst.  Hunger.  Feeling very tired.  Needing to urinate more often than usual.  Blurry vision. Other symptoms may develop if hyperglycemia gets worse, such as:  Dry mouth.  Loss of appetite.  Fruity-smelling breath.  Weakness.  Unexpected or rapid weight gain or weight loss.  Tingling or numbness in the hands or feet.  Headache.  Skin that does not quickly return to normal after being lightly pinched and released (poor skin turgor).  Abdominal pain.  Cuts or bruises that are slow to heal. How is this diagnosed? Hyperglycemia is diagnosed with a blood test to measure your blood glucose level. This blood test is usually done while you are having symptoms. Your health care provider may also do a physical exam and review your medical history. You may have more tests to determine the cause of your hyperglycemia, such as:  A fasting blood glucose (FBG) test. You will not be allowed to eat (you will fast) for at least 8 hours before a blood sample is taken.  An A1c (hemoglobin A1c) blood test. This provides information about blood glucose control over the previous 2-3 months.  An oral glucose tolerance test (OGTT). This measures your blood glucose at two times: ? After fasting. This is your baseline  blood glucose level. ? Two hours after drinking a beverage that contains glucose. How is this treated? Treatment depends on the cause of your hyperglycemia. Treatment may include:  Taking medicine to regulate your blood glucose levels. If you take insulin or other diabetes medicines, your medicine or dosage may be adjusted.  Lifestyle changes, such as exercising more, eating healthier foods, or losing weight.  Treating an illness or infection, if this caused your hyperglycemia.  Checking your blood glucose more often.  Stopping or reducing steroid medicines, if these caused your  hyperglycemia. If your hyperglycemia becomes severe and it results in hyperglycemic hyperosmolar state, you must be hospitalized and given IV fluids. Follow these instructions at home:  General instructions  Take over-the-counter and prescription medicines only as told by your health care provider.  Do not use any products that contain nicotine or tobacco, such as cigarettes and e-cigarettes. If you need help quitting, ask your health care provider.  Limit alcohol intake to no more than 1 drink per day for nonpregnant women and 2 drinks per day for men. One drink equals 12 oz of beer, 5 oz of wine, or 1 oz of hard liquor.  Learn to manage stress. If you need help with this, ask your health care provider.  Keep all follow-up visits as told by your health care provider. This is important. Eating and drinking  Major Nutrition Goals: (from visit with dietitian)  Eat regularly throughout the day. DONT skip meals  Make 75% of meal vegetables (non-starchy) and protein, 25% carb  Aim for 75-80g Carb at meals MAX.   You can still have your favorite foods (mac/cheese) as [place] as meal time carbs are <75-80. Pair mac and cheese with chicken (non breaded) and non stachy vegetables.    Additionally,   Maintain a healthy weight.  Exercise regularly, as directed by your health care provider.  Stay hydrated, especially when you exercise, get sick, or spend time in hot temperatures.  Eat healthy foods, such as: ? Lean proteins. ? Complex carbohydrates. ? Fresh fruits and vegetables. ? Low-fat dairy products. ? Healthy fats.  Drink enough fluid to keep your urine clear or pale yellow. If you have diabetes:  Make sure you know the symptoms of hyperglycemia.  Follow your diabetes management plan, as told by your health care provider. Make sure you: ? Take your insulin and medicines as directed. ? Follow your exercise plan. ? Follow your meal plan. Eat on time, and do not skip  meals. ? Check your blood glucose as often as directed. Make sure to check your blood glucose before and after exercise. If you exercise longer or in a different way than usual, check your blood glucose more often. ? Follow your sick day plan whenever you cannot eat or drink normally. Make this plan in advance with your health care provider.  Share your diabetes management plan with people in your workplace, school, and household.  Check your urine for ketones when you are ill and as told by your health care provider.  Carry a medical alert card or wear medical alert jewelry. Contact a health care provider if:  Your blood glucose is at or above 240 mg/dL (13.3 mmol/L) for 2 days in a row.  You have problems keeping your blood glucose in your target range.  You have frequent episodes of hyperglycemia. Get help right away if:  You have difficulty breathing.  You have a change in how you think, feel, or act (mental status).  You have nausea or vomiting that does not go away. These symptoms may represent a serious problem that is an emergency. Do not wait to see if the symptoms will go away. Get medical help right away. Call your local emergency services (911 in the [place] Do not drive yourself to the hospital. Summary  Hyperglycemia occurs when the level of sugar (glucose) in the blood is too high.  Hyperglycemia is diagnosed with a blood test to measure your blood glucose level. This blood test is usually done while you are having symptoms. Your health care provider may also do a physical exam and review your medical history.  If you have diabetes, follow your diabetes management plan as told by your health care provider.  Contact your health care provider if you have problems keeping your blood glucose in your target range. This information is not intended to replace advice given to you by your health care provider. Make sure you discuss any questions you have with your health care  provider. Document Released: 01/07/2001 Document Revised: 03/31/2016 Document Reviewed: 03/31/2016 Elsevier Interactive Patient Education  2019 [place]. Blood Glucose Monitoring, Adult Monitoring your blood sugar (glucose) is an important part of managing your diabetes (diabetes mellitus). Blood glucose monitoring involves checking your blood glucose as often as directed and keeping a record (log) of your results over time. Checking your blood glucose regularly and keeping a blood glucose log can:  Help you and your health care provider adjust your diabetes management plan as needed, including your medicines or insulin.  Help you understand how food, exercise, illnesses, and medicines affect your blood glucose.  Let you know what your blood glucose is at any time. You can quickly find out if you have low blood glucose (hypoglycemia) or high blood glucose (hyperglycemia). Your health care provider will set individualized treatment goals for you. Your goals will be based on your age, other medical conditions you have, and how you respond to diabetes treatment. Generally, the goal of treatment is to maintain the following blood glucose levels:  Before meals (preprandial): 80-130 mg/dL (4.4-7.2 mmol/L).  After meals (postprandial): below 180 mg/dL (10 mmol/L).  A1c level: less than 7%. Supplies needed:  Blood glucose meter.  Test strips for your meter. Each meter has its own strips. You must use the strips that came with your meter.  A needle to prick your finger (lancet). Do not use a lancet more than one time.  A device that holds the lancet (lancing device).  A journal or log book to write down your results. How to check your blood glucose  1. Wash your hands with soap and water. 2. Prick the side of your finger (not the tip) with the lancet. Use a different finger each time. 3. Gently rub the finger until a small drop of blood appears. 4. Follow instructions that come with  your meter for inserting the test strip, applying blood to the strip, and using your blood glucose meter. 5. Write down your result and any notes. Some meters allow you to use areas of your body other than your finger (alternative sites) to test your blood. The most common alternative sites are:  Forearm.  Thigh.  Palm of the hand. If you think you may have hypoglycemia, or if you have a history of not knowing when your blood glucose is getting low (hypoglycemia unawareness), do not use alternative sites. Use your finger instead. Alternative sites may not be as accurate as the fingers, because blood flow  is slower in these areas. This means that the result you get may be delayed, and it may be different from the result that you would get from your finger. Follow these instructions at home: Blood glucose log   Every time you check your blood glucose, write down your result. Also write down any notes about things that may be affecting your blood glucose, such as your diet and exercise for the day. This information can help you and your health care provider: ? Look for patterns in your blood glucose over time. ? Adjust your diabetes management plan as needed.  Check if your meter allows you to download your records to a computer. Most glucose meters store a record of glucose readings in the meter. If you have type 1 diabetes:  Check your blood glucose 2 or more times a day.  Also check your blood glucose: ? Before every insulin injection. ? Before and after exercise. ? Before meals. ? 2 hours after a meal. ? Occasionally between 2:00 a.m. and 3:00 a.m., as directed. ? Before potentially dangerous tasks, like driving or using heavy machinery. ? At bedtime.  You may need to check your blood glucose more often, up to 6-10 times a day, if you: ? Use an insulin pump. ? Need multiple daily injections (MDI). ? Have diabetes that is not well-controlled. ? Are ill. ? Have a history of severe  hypoglycemia. ? Have hypoglycemia unawareness. If you have type 2 diabetes:  If you take insulin or other diabetes medicines, check your blood glucose 2 or more times a day.  If you are on intensive insulin therapy, check your blood glucose 4 or more times a day. Occasionally, you may also need to check between 2:00 a.m. and 3:00 a.m., as directed.  Also check your blood glucose: ? Before and after exercise. ? Before potentially dangerous tasks, like driving or using heavy machinery.  You may need to check your blood glucose more often if: ? Your medicine is being adjusted. ? Your diabetes is not well-controlled. ? You are ill. General tips  Always keep your supplies with you.  If you have questions or need help, all blood glucose meters have a 24-hour "hotline" phone number that you can call. You may also contact your health care provider.  After you use a few boxes of test strips, adjust (calibrate) your blood glucose meter by following instructions that came with your meter. Contact a health care provider if:  Your blood glucose is at or above 240 mg/dL (13.3 mmol/L) for 2 days in a row.  You have been sick or have had a fever for 2 days or longer, and you are not getting better.  You have any of the following problems for more than 6 hours: ? You cannot eat or drink. ? You have nausea or vomiting. ? You have diarrhea. Get help right away if:  Your blood glucose is lower than 54 mg/dL (3 mmol/L).  You become confused or you have trouble thinking clearly.  You have difficulty breathing.  You have moderate or large ketone levels in your urine. Summary  Monitoring your blood sugar (glucose) is an important part of managing your diabetes (diabetes mellitus).  Blood glucose monitoring involves checking your blood glucose as often as directed and keeping a record (log) of your results over time.  Your health care provider will set individualized treatment goals for you.  Your goals will be based on your age, other medical conditions you have, and  how you respond to diabetes treatment.  Every time you check your blood glucose, write down your result. Also write down any notes about things that may be affecting your blood glucose, such as your diet and exercise for the day. This information is not intended to replace advice given to you by your health care provider. Make sure you discuss any questions you have with your health care provider. Document Released: 07/17/2003 Document Revised: 05/25/2017 Document Reviewed: 12/24/2015 Elsevier Interactive Patient Education  2019 [employer].

## 2018-11-24 NOTE — TOC Transition Note (Signed)
Transition of Care [facility]) - CM/SW Discharge Note   Patient Details  Name: Kyle Collins MRN: [id number] Date of Birth: [birth year]  [facility] CM/SW Contact:  Sherald Barge, RN Phone Number: 11/24/2018, 3:25 PM   Clinical Narrative:   [place] home today. MD has ordered [place] and pt has refused [place] services saying "I don't want those people in my house".     Final next level of care: Home/Self Care        Discharge Plan and Services In-house Referral: Nutrition Discharge Planning Services: Medication Assistance             HH Arranged: Refused [facility]       Readmission Risk Interventions Readmission Risk Prevention Plan 11/24/2018  Post Dischage Appt Complete  Medication Screening Complete  Transportation Screening Complete  Some recent data might be hidden

## 2018-11-25 ENCOUNTER — Encounter (HOSPITAL_COMMUNITY): Payer: Self-pay | Admitting: General Surgery

## 2018-11-29 ENCOUNTER — Other Ambulatory Visit: Payer: Self-pay

## 2018-11-29 NOTE — Patient Outreach (Signed)
[place] [facility]) Care Management  [place]  11/29/2018   Kyle Collins 1986-06-16 [id number]  Subjective: Outreach for completion of telephonic assessment.  Objective:  Encounter Medications:  Outpatient Encounter Medications as of 11/29/2018  Medication Sig  . amLODipine (NORVASC) 10 MG tablet Take 1 tablet (10 mg total) by mouth daily.  . blood glucose meter kit and supplies KIT Dispense based on patient and insurance preference. Use up to 2 times daily as directed. (FOR ICD-10 E11.65)  . blood glucose meter kit and supplies Dispense based on patient and insurance preference. Use up to four times daily as directed. (FOR ICD-10 E10.9, E11.9).  . citalopram (CELEXA) 20 MG tablet Take 20 mg by mouth daily.   . empagliflozin (JARDIANCE) 10 MG TABS tablet Take 10 mg by mouth daily.  . hydrochlorothiazide (HYDRODIURIL) 25 MG tablet TAKE ONE TABLET BY MOUTH DAILY. (Patient taking differently: Take 25 mg by mouth daily. )  . Insulin Glargine (LANTUS) 100 UNIT/ML Solostar Pen Inject 10 Units into the skin daily for 30 days.  . Insulin Pen Needle (ABOUTTIME PEN NEEDLE) 31G X 8 MM MISC Use as needed to inject insulin daily as instructed  . lisinopril (PRINIVIL,ZESTRIL) 40 MG tablet Take 1 tablet (40 mg total) by mouth daily.  . metFORMIN (GLUCOPHAGE-XR) 500 MG 24 hr tablet Take 1,000 mg by mouth 2 (two) times daily.   Marland Kitchen oxyCODONE-acetaminophen (PERCOCET) 7.5-325 MG tablet Take 1 tablet by mouth every 6 (six) hours as needed.  . pantoprazole (PROTONIX) 40 MG tablet Take 1 tablet (40 mg total) by mouth daily.   No facility-administered encounter medications on file as of 11/29/2018.     Functional Status:  In your present state of health, do you have any difficulty performing the following activities: 11/29/2018 11/21/2018  Hearing? N -  Vision? N -  Difficulty concentrating or making decisions? N -  Walking or climbing stairs? N -  Dressing or bathing? N -  Doing errands,  shopping? N N  Preparing Food and eating ? N -  Using the Toilet? N -  In the past six months, have you accidently leaked urine? N -  Do you have problems with loss of bowel control? N -  Managing your Medications? N -  Managing your Finances? N -  Housekeeping or managing your Housekeeping? N -  Some recent data might be hidden    Fall/Depression Screening: Fall Risk  11/29/2018 01/07/2016 10/26/2015  Falls in the past year? 0 No No  Risk for fall due to : Other (Comment) - -  Risk for fall due to: Comment Medical condition. - -  Follow up Falls prevention discussed - -   PHQ 2/9 Scores 11/29/2018 01/07/2016 10/26/2015 07/16/2015  PHQ - 2 Score 0 0 0 0    Assessment:  Telephonic assessment complete. Kyle Collins states that he feels well today. He was evaluated by Dr. Sherrie Sport last week. Reports taking medications as prescribed. He has not monitored his blood glucose since discharge due to not having a glucometer. Pending receipt from [facility]. He was able to recall appropriate treatment measures for symptoms of hypoglycemia or hyperglycemia. Verbalized knowledge of s/sx that require immediate medical attention.  [facility] CM Care Plan Problem One     Most Recent Value  Care Plan Problem One  Risk for Readmission  Role Documenting the Problem One  Care Management [place] for Problem One  Active  THN [place] Term Goal   Over the  next 60 days, patient will not be readmitted for chronic disease complications.  THN [place] Term Goal Start Date  11/29/18  Interventions for Problem One [place] Term Goal  Discussed medications, nutrition, and s/sx of hypoglycemia and hyperglycemia. Discussed safety precautions.  THN CM Short Term Goal #1   Patient will complete evaluation with Endocrinologist within the next 30 days.  THN CM Short Term Goal #1 Start Date  11/29/18  Interventions for Short Term Goal #1  Discussed importance of completing outreach as scheduled and establishing an updated plan  for diabetes management.  [Pending outreach with [employer] provider.]  THN CM Short Term Goal #2   Within the next 30 days, patient will obtain glucometer and monitor blood glucose levels daily. [Pending receipt of glucometer from [facility].]  THN CM Short Term Goal #2 Start Date  11/29/18  Interventions for Short Term Goal #2  Provided education regarding s/sx of hypoglycemia and hyperglycemia. Discussed importance of monitoring levels daily and recording readings.  THN CM Short Term Goal #3  Over the next 30 days patient will take all medications as prescribed.  THN CM Short Term Goal #3 Start Date  11/29/18  Interventions for Short [place] Goal #3  Discussed medications and indications for use.  [No concerns regarding medication affordability.]      PLAN Will follow up within two weeks.    [place] [phone]

## 2018-11-30 ENCOUNTER — Telehealth: Payer: Self-pay | Admitting: General Surgery

## 2018-11-30 NOTE — Telephone Encounter (Signed)
Telephone follow-up visit for performed.  Patient states he is doing well.  His preoperative symptoms have resolved.  He states his blood sugars are becoming better controlled.  He is eating solid food.  He is pleased with the results.  He was told to contact my office should any problems arise.

## 2018-12-02 DIAGNOSIS — K828 Other specified diseases of gallbladder: Secondary | ICD-10-CM | POA: Diagnosis not present

## 2018-12-14 ENCOUNTER — Other Ambulatory Visit: Payer: Self-pay

## 2018-12-14 NOTE — Patient Outreach (Signed)
[place] [facility]) Care Management  12/14/2018  [place] 01/27/1986 [id number]    Outreach with Mr. Campton. Reports feeling ok today but requested a call back on Thursday afternoon.   PLAN Will follow up on 12/16/18.   [place] [phone]

## 2018-12-16 ENCOUNTER — Other Ambulatory Visit: Payer: Self-pay

## 2018-12-16 NOTE — Patient Outreach (Signed)
[facility]) Care Management  12/16/2018  Constantinos Krempasky [place] [birth year] [id number]   Follow up regarding glucometer. Mr. Torry reports not being able to obtain a glucometer. States pharmacy staff would not issue the device. They are requesting additional documents from Dr. Sherrie Sport.   Call placed to [facility] and Dr. Rhett Bannister office. Dr. Joselyn Arrow nurse agreed to fax Mr. Mckelvy last clinical note to the pharmacy as requested. Pharmacy staff agreed to contact Mr. Shin when the glucometer and supplies are ready for pick-up.  PLAN Will follow up with Mr. Kutzer next week.   [place] [phone]

## 2018-12-24 ENCOUNTER — Other Ambulatory Visit: Payer: Self-pay

## 2018-12-24 NOTE — Patient Outreach (Signed)
[place] [facility]) Care Management  12/24/2018  [place] October 15, 1985 [id number]    Outreach regarding glucometer. Confirmed that Kyle Collins received the required documents. Per our discussion, Kyle Collins will be required to sign additional documents at the pharmacy prior to the device being dispensed.  Kyle Collins is aware and agreeable to follow up outreach next week.  PLAN -Will follow up next week.   [place] [phone]

## 2018-12-31 ENCOUNTER — Other Ambulatory Visit: Payer: Self-pay

## 2018-12-31 NOTE — Patient Outreach (Signed)
[place] [facility]) Care Management  12/31/2018  [place] 03-06-1986 [id number]    Follow up outreach with Mr. Stammer regarding glucometer. Reports waiting for transportation to the pharmacy. Anticipates obtaining glucometer later today or tomorrow. Encouraged to contact RNCM if the glucometer is not dispensed and additional assistance is needed.  PLAN -Will follow up next week.   [place] [phone]

## 2019-01-06 ENCOUNTER — Other Ambulatory Visit: Payer: Self-pay

## 2019-01-06 NOTE — Patient Outreach (Signed)
[place] [facility]) Care Management  01/06/2019  [place] 1986-01-21 [id number]  Follow-up regarding glucometer. Kyle Collins reports speaking with [employer] staff since our last conversation. Reports not having time to obtain the device. Indicated that he does not intend to pick up the glucometer until his medication refills are due.  Discussed concerns regarding his high risk. He reports taking insulin as prescribed but has not monitored his blood sugar levels since being discharged in April. He is very knowledgeable of s/sx of hypoglycemia and hyperglycemia as well as indications for seeking immediate medical attention. He is agreeable to a follow-up call next week.  PLAN -Will follow-up next week.   [place] [phone]

## 2019-01-13 ENCOUNTER — Other Ambulatory Visit: Payer: Self-pay

## 2019-01-13 NOTE — Patient Outreach (Signed)
[place] [facility]) Care Management  01/13/2019  Jaishon Krisher [place] 1986/04/04 [id number]   Attempted follow-up to confirm that Mr. Kyle Collins received his glucometer. Outreach unsuccessful. Phone rang multiple times without option to leave a voice message. Will attempt outreach next week. PLAN -Will follow up next week.   [place] [phone]

## 2019-01-18 ENCOUNTER — Other Ambulatory Visit: Payer: Self-pay

## 2019-01-18 NOTE — Patient Outreach (Signed)
[place] [facility]) Care Management  01/18/2019  Husayn Reim [place] 25-Nov-1985 [id number]  Brief follow-up with Mr. Schwandt. He confirmed receipt of his glucometer but reports not monitoring his blood sugar levels. Reports being tired at the time of the call. He prefers to be contacted a later time. Agreeable to outreach in two weeks to discuss blood sugar levels.  PLAN -Will follow-up within two weeks.   [place] [phone]

## 2019-02-01 ENCOUNTER — Other Ambulatory Visit: Payer: Self-pay

## 2019-02-01 NOTE — Patient Outreach (Signed)
[place] [facility]) Care Management  02/01/2019  [place] [birth year] [id number]    Unsuccessful outreach attempt. Left HIPAA compliant voice message requesting a return call.   PLAN -Will follow-up within 3-4 business days.   [place] [phone]

## 2019-02-04 ENCOUNTER — Other Ambulatory Visit: Payer: Self-pay

## 2019-02-04 NOTE — Patient Outreach (Signed)
[place] [facility]) Care Management  02/04/2019  Kyle Collins 03/30/1986 [id number]     Unsuccessful outreach attempt. Left voice message requesting a return call.    PLAN -Will send unsuccessful outreach letter. -Will follow-up within 3-4 business days.   [place] [phone]

## 2019-02-10 ENCOUNTER — Other Ambulatory Visit: Payer: Self-pay

## 2019-02-10 NOTE — Patient Outreach (Signed)
[place] [facility]) Care Management  02/10/2019  Kyle Collins October 01, 1985 [id number]    3rd unsuccessful outreach attempt. Left voice message requesting a return call.     PLAN -Case closure pending.   [place] [phone]

## 2019-02-15 DIAGNOSIS — E1165 Type 2 diabetes mellitus with hyperglycemia: Secondary | ICD-10-CM | POA: Diagnosis not present

## 2019-02-15 DIAGNOSIS — G4730 Sleep apnea, unspecified: Secondary | ICD-10-CM | POA: Diagnosis not present

## 2019-02-15 DIAGNOSIS — I10 Essential (primary) hypertension: Secondary | ICD-10-CM | POA: Diagnosis not present

## 2019-02-16 ENCOUNTER — Ambulatory Visit: Payer: Medicare Other | Admitting: Urology

## 2019-02-24 DIAGNOSIS — D751 Secondary polycythemia: Secondary | ICD-10-CM | POA: Diagnosis not present

## 2019-02-24 DIAGNOSIS — Z148 Genetic carrier of other disease: Secondary | ICD-10-CM | POA: Diagnosis not present

## 2019-02-24 DIAGNOSIS — R7989 Other specified abnormal findings of blood chemistry: Secondary | ICD-10-CM | POA: Diagnosis not present

## 2019-03-04 DIAGNOSIS — D751 Secondary polycythemia: Secondary | ICD-10-CM | POA: Diagnosis not present

## 2019-03-04 DIAGNOSIS — R7989 Other specified abnormal findings of blood chemistry: Secondary | ICD-10-CM | POA: Diagnosis not present

## 2019-03-08 ENCOUNTER — Other Ambulatory Visit: Payer: Self-pay

## 2019-03-08 ENCOUNTER — Ambulatory Visit (INDEPENDENT_AMBULATORY_CARE_PROVIDER_SITE_OTHER): Payer: Medicare Other | Admitting: Urology

## 2019-03-08 DIAGNOSIS — N480 Leukoplakia of penis: Secondary | ICD-10-CM

## 2019-03-10 DIAGNOSIS — R7989 Other specified abnormal findings of blood chemistry: Secondary | ICD-10-CM | POA: Diagnosis not present

## 2019-03-10 DIAGNOSIS — D751 Secondary polycythemia: Secondary | ICD-10-CM | POA: Diagnosis not present

## 2019-03-22 DIAGNOSIS — D751 Secondary polycythemia: Secondary | ICD-10-CM | POA: Diagnosis not present

## 2019-03-24 DIAGNOSIS — D751 Secondary polycythemia: Secondary | ICD-10-CM | POA: Diagnosis not present

## 2019-03-24 DIAGNOSIS — R7989 Other specified abnormal findings of blood chemistry: Secondary | ICD-10-CM | POA: Diagnosis not present

## 2019-05-18 DIAGNOSIS — Z1389 Encounter for screening for other disorder: Secondary | ICD-10-CM | POA: Diagnosis not present

## 2019-05-18 DIAGNOSIS — E1142 Type 2 diabetes mellitus with diabetic polyneuropathy: Secondary | ICD-10-CM | POA: Diagnosis not present

## 2019-05-18 DIAGNOSIS — Z0000 Encounter for general adult medical examination without abnormal findings: Secondary | ICD-10-CM | POA: Diagnosis not present

## 2019-05-18 DIAGNOSIS — I10 Essential (primary) hypertension: Secondary | ICD-10-CM | POA: Diagnosis not present

## 2019-05-18 DIAGNOSIS — G4730 Sleep apnea, unspecified: Secondary | ICD-10-CM | POA: Diagnosis not present

## 2019-05-27 ENCOUNTER — Other Ambulatory Visit: Payer: Self-pay

## 2019-05-27 ENCOUNTER — Emergency Department (HOSPITAL_COMMUNITY): Payer: Medicare Other

## 2019-05-27 ENCOUNTER — Encounter (HOSPITAL_COMMUNITY): Payer: Self-pay

## 2019-05-27 ENCOUNTER — Emergency Department (HOSPITAL_COMMUNITY)
Admission: EM | Admit: 2019-05-27 | Discharge: 2019-05-27 | Disposition: A | Discharge status: Home / Self Care | Payer: Medicare Other | Attending: Emergency Medicine | Admitting: Emergency Medicine

## 2019-05-27 DIAGNOSIS — I10 Essential (primary) hypertension: Secondary | ICD-10-CM | POA: Diagnosis not present

## 2019-05-27 DIAGNOSIS — R1110 Vomiting, unspecified: Secondary | ICD-10-CM | POA: Diagnosis not present

## 2019-05-27 DIAGNOSIS — R197 Diarrhea, unspecified: Secondary | ICD-10-CM | POA: Diagnosis not present

## 2019-05-27 DIAGNOSIS — R109 Unspecified abdominal pain: Secondary | ICD-10-CM | POA: Diagnosis present

## 2019-05-27 DIAGNOSIS — Z794 Long term (current) use of insulin: Secondary | ICD-10-CM | POA: Insufficient documentation

## 2019-05-27 DIAGNOSIS — R000 Tachycardia, unspecified: Secondary | ICD-10-CM | POA: Diagnosis not present

## 2019-05-27 DIAGNOSIS — Z79899 Other long term (current) drug therapy: Secondary | ICD-10-CM | POA: Insufficient documentation

## 2019-05-27 DIAGNOSIS — R112 Nausea with vomiting, unspecified: Secondary | ICD-10-CM | POA: Insufficient documentation

## 2019-05-27 DIAGNOSIS — Z87891 Personal history of nicotine dependence: Secondary | ICD-10-CM | POA: Diagnosis not present

## 2019-05-27 DIAGNOSIS — E119 Type 2 diabetes mellitus without complications: Secondary | ICD-10-CM | POA: Diagnosis not present

## 2019-05-27 DIAGNOSIS — R1031 Right lower quadrant pain: Secondary | ICD-10-CM | POA: Diagnosis not present

## 2019-05-27 LAB — URINALYSIS, ROUTINE W REFLEX MICROSCOPIC
Bacteria, UA: NONE SEEN
Bilirubin Urine: NEGATIVE
Glucose, UA: >=500 mg/dL — AB
Hgb urine dipstick: NEGATIVE
Ketones, ur: 5 mg/dL — AB
Leukocytes,Ua: NEGATIVE
Nitrite: NEGATIVE
Protein, ur: NEGATIVE mg/dL
Specific Gravity, Urine: >1.046 — ABNORMAL HIGH (ref 1.005–1.030)
pH: 6 (ref 5.0–8.0)

## 2019-05-27 LAB — LIPASE, BLOOD: Lipase: 64 U/L — ABNORMAL HIGH (ref 11–51)

## 2019-05-27 LAB — COMPREHENSIVE METABOLIC PANEL
ALT: 24 U/L (ref 0–44)
AST: 19 U/L (ref 15–41)
Albumin: 4.7 g/dL (ref 3.5–5.0)
Alkaline Phosphatase: 66 U/L (ref 38–126)
Anion gap: 14 (ref 5–15)
BUN: 32 mg/dL — ABNORMAL HIGH (ref 6–20)
CO2: 23 mmol/L (ref 22–32)
Calcium: 9.4 mg/dL (ref 8.9–10.3)
Chloride: 97 mmol/L — ABNORMAL LOW (ref 98–111)
Creatinine, Ser: 1.1 mg/dL (ref 0.61–1.24)
GFR calc Af Amer: >60 mL/min (ref >60)
GFR calc non Af Amer: >60 mL/min (ref >60)
Glucose, Bld: 308 mg/dL — ABNORMAL HIGH (ref 70–99)
Potassium: 3.3 mmol/L — ABNORMAL LOW (ref 3.5–5.1)
Sodium: 134 mmol/L — ABNORMAL LOW (ref 135–145)
Total Bilirubin: 1.6 mg/dL — ABNORMAL HIGH (ref 0.3–1.2)
Total Protein: 8.7 g/dL — ABNORMAL HIGH (ref 6.5–8.1)

## 2019-05-27 LAB — CBG MONITORING, ED: Glucose-Capillary: 267 mg/dL — ABNORMAL HIGH (ref 70–99)

## 2019-05-27 LAB — CBC
HCT: 47.5 % (ref 39.0–52.0)
Hemoglobin: 16.1 g/dL (ref 13.0–17.0)
MCH: 28.6 pg (ref 26.0–34.0)
MCHC: 33.9 g/dL (ref 30.0–36.0)
MCV: 84.4 fL (ref 80.0–100.0)
Platelets: 340 10*3/uL (ref 150–400)
RBC: 5.63 MIL/uL (ref 4.22–5.81)
RDW: 13.1 % (ref 11.5–15.5)
WBC: 11.1 10*3/uL — ABNORMAL HIGH (ref 4.0–10.5)
nRBC: 0 % (ref 0.0–0.2)

## 2019-05-27 LAB — TROPONIN I (HIGH SENSITIVITY)
Troponin I (High Sensitivity): 5 ng/L (ref <18)
Troponin I (High Sensitivity): 5 ng/L (ref <18)

## 2019-05-27 MED ORDER — ONDANSETRON HCL 4 MG PO TABS: 4.0000 mg | ORAL_TABLET | Freq: Four times a day (QID) | ORAL | 0 refills | Status: DC

## 2019-05-27 MED ORDER — MORPHINE SULFATE (PF) 4 MG/ML IV SOLN
4.0000 mg | Freq: Once | INTRAVENOUS | Status: AC
  Administered 2019-05-27: 16:00:00 4 mg via INTRAVENOUS
  Filled 2019-05-27: qty 1

## 2019-05-27 MED ORDER — IOHEXOL 300 MG/ML  SOLN
100.0000 mL | Freq: Once | INTRAMUSCULAR | Status: AC | PRN
  Administered 2019-05-27: 19:00:00 100 mL via INTRAVENOUS

## 2019-05-27 MED ORDER — SODIUM CHLORIDE 0.9% FLUSH: 3.0000 mL | Freq: Once | INTRAVENOUS | Status: DC

## 2019-05-27 MED ORDER — SODIUM CHLORIDE 0.9 % IV BOLUS
1000.0000 mL | Freq: Once | INTRAVENOUS | Status: AC
  Administered 2019-05-27: 16:00:00 1000 mL via INTRAVENOUS

## 2019-05-27 MED ORDER — PROCHLORPERAZINE MALEATE 5 MG PO TABS
5.0000 mg | ORAL_TABLET | Freq: Once | ORAL | Status: AC
  Administered 2019-05-27: 5 mg via ORAL
  Filled 2019-05-27: qty 1

## 2019-05-27 MED ORDER — POTASSIUM CHLORIDE CRYS ER 20 MEQ PO TBCR
40.0000 meq | EXTENDED_RELEASE_TABLET | Freq: Once | ORAL | Status: AC
  Administered 2019-05-27: 40 meq via ORAL
  Filled 2019-05-27: qty 2

## 2019-05-27 MED ORDER — HYDROCODONE-ACETAMINOPHEN 5-325 MG PO TABS
2.0000 | ORAL_TABLET | Freq: Once | ORAL | Status: AC
  Administered 2019-05-27: 2 via ORAL
  Filled 2019-05-27: qty 2

## 2019-05-27 MED ORDER — PANTOPRAZOLE SODIUM 40 MG PO TBEC: 40.0000 mg | DELAYED_RELEASE_TABLET | Freq: Every day | ORAL | 1 refills | Status: DC

## 2019-05-27 MED ORDER — ONDANSETRON HCL 4 MG/2ML IJ SOLN
4.0000 mg | Freq: Once | INTRAMUSCULAR | Status: AC
  Administered 2019-05-27: 4 mg via INTRAVENOUS
  Filled 2019-05-27: qty 2

## 2019-05-27 NOTE — Discharge Instructions (Addendum)
Your BUN was slightly elevated.  Please increase fluids.  Your potassium was slightly low at 3.3.  You were given oral potassium here in the emergency department.  I have included the potassium content of foods in your discharge instructions.  Please add foods high in potassium to your diet.  Your glucose was elevated between 262, and 308.  Please monitor this closely.  You may need to discuss this with your primary physician for better control of your glucose. Your heart enzymes were negative on 2 occasions for acute cardiac event.  Please call the cardiology specialist listed on your discharge instructions to set up an outpatient evaluation to further study your chest pain and discomfort. Please return to the emergency department if there are any emergent changes in your condition, significant worsening of your pain, problems, or concerns.  A prescription for Zofran and protonix  is also given to assist with your pain and nausea.

## 2019-05-27 NOTE — ED Provider Notes (Signed)
[facility] EMERGENCY DEPARTMENT Provider Note   CSN: [id number] Arrival date & time: 05/27/19  1330     History   Chief Complaint Chief Complaint  Patient presents with  . Abdominal Pain    HPI Kyle Collins is a 33 y.o. male presenting for evaluation nausea, vomiting, abdominal pain.  Patient states for the past few days, he has been having persistent nausea, vomiting, abdominal pain.  It began with nausea, he then started vomiting.  He reports diarrhea, and right lower quadrant abdominal pain.  Pain is constant, worse with movement.  Nothing makes it better.  Patient states that he is able to drink a little bit of water, but anymore he starts vomiting.  He has not had any solid food intake.  He reports subjective low-grade fevers.  Patient states he is also developed upper abdominal pain, worse when he is feeling nauseous and vomiting. Additionally, patient reporting several months of chest pain and shortness of breath with exertion.  No pain at rest.  He states he feels weak with exertion.  Patient states he had this assessed, however not in our system.  He denies current tobacco, alcohol, or drug use.  He has a history of hypertension, diabetes, depression, GERD, polycythemia, OSA, bipolar, and morbid obesity.     HPI  Past Medical History:  Diagnosis Date  . ADHD (attention deficit hyperactivity disorder)   . Anxiety   . Bipolar disorder ([place])   . Depression   . Diabetes mellitus   . GERD (gastroesophageal reflux disease)   . Headache   . Hemochromatosis 2019  . Hypertension   . Polycythemia   . Sleep apnea   . Thyroid disease     Patient Active Problem List   Diagnosis Date Noted  . Abdominal pain   . Gastritis 11/22/2018  . DKA (diabetic ketoacidoses) ([place]) 11/22/2018  . DKA (diabetic ketoacidosis) ([place]) 11/20/2018  . Sleep apnea 11/20/2018  . GERD (gastroesophageal reflux disease) 11/20/2018  . Polycythemia 11/20/2018  . Hyperbilirubinemia 11/20/2018  .  Cholelithiasis 11/20/2018  . S/P nasal septoplasty 08/18/2018  . Fracture dislocation of right shoulder joint 12/04/2015  . Dislocation, shoulder closed   . Greater tuberosity of humerus fracture   . Morbid obesity due to excess calories ([place]) 10/26/2015  . Uncontrolled type 2 diabetes mellitus without complication, without [place]-term current use of insulin 07/16/2015  . Essential hypertension, benign 07/16/2015  . Personal history of noncompliance with medical treatment, presenting hazards to health 07/16/2015    Past Surgical History:  Procedure Laterality Date  . CHOLECYSTECTOMY N/A 11/24/2018   Procedure: LAPAROSCOPIC CHOLECYSTECTOMY;  Surgeon: Aviva Signs, MD;  Location: [facility];  Service: General;  Laterality: N/A;  . CIRCUMCISION N/A 09/14/2017   Procedure: CIRCUMCISION;  Surgeon: Cleon Gustin, MD;  Location: [facility];  Service: Urology;  Laterality: N/A;  1 HR 336 [phone] - He has a Education officer, museum that needs to be called if changes made Mateo [facility] @ [place] [facility]  . HERNIA REPAIR     scrotum  . MAXILLARY ANTROSTOMY Right 08/18/2018   Procedure: ENDOSCOPIC MAXILLARY RIGHT ANTROSTOMY WITH TISSUE REMOVAL;  Surgeon: Leta Baptist, MD;  Location: [place];  Service: ENT;  Laterality: Right;  . NASAL SEPTOPLASTY W/ TURBINOPLASTY Bilateral 08/18/2018   Procedure: NASAL SEPTOPLASTY WITH TURBINATE REDUCTION;  Surgeon: Leta Baptist, MD;  Location: [place];  Service: ENT;  Laterality: Bilateral;  . NASAL SEPTUM SURGERY  08/18/2018  . ORIF SHOULDER FRACTURE Right 02/28/2016   Procedure: OPEN  REDUCTION INTERNAL FIXATION (ORIF) SHOULDER FRACTURE;  Surgeon: Carole Civil, MD;  Location: [facility];  Service: Orthopedics;  Laterality: Right;  . SHOULDER CLOSED REDUCTION Right 12/04/2015   Procedure: CLOSED REDUCTION SHOULDER;  Surgeon: Carole Civil, MD;  Location: [facility];  Service: Orthopedics;  Laterality: Right;  . TOOTH EXTRACTION  spring 2016   top left          Home Medications    Prior to Admission medications   Medication Sig Start Date End Date Taking? Authorizing Provider  amLODipine (NORVASC) 10 MG tablet Take 1 tablet (10 mg total) by mouth daily. 11/25/18   Barton Dubois, MD  blood glucose meter kit and supplies KIT Dispense based on patient and insurance preference. Use up to 2 times daily as directed. (FOR ICD-10 E11.65) 08/11/16   Cassandria Anger, MD  blood glucose meter kit and supplies Dispense based on patient and insurance preference. Use up to four times daily as directed. (FOR ICD-10 E10.9, E11.9). 11/24/18   Barton Dubois, MD  citalopram (CELEXA) 20 MG tablet Take 20 mg by mouth daily.     [provider]  empagliflozin (JARDIANCE) 10 MG TABS tablet Take 10 mg by mouth daily. 03/05/17   Cassandria Anger, MD  hydrochlorothiazide (HYDRODIURIL) 25 MG tablet TAKE ONE TABLET BY MOUTH DAILY. Patient taking differently: Take 25 mg by mouth daily.  06/02/17   Cassandria Anger, MD  Insulin Glargine (LANTUS) 100 UNIT/ML Solostar Pen Inject 10 Units into the skin daily for 30 days. 11/24/18 12/24/18  Barton Dubois, MD  Insulin Pen Needle (ABOUTTIME PEN NEEDLE) 31G X 8 MM MISC Use as needed to inject insulin daily as instructed 11/24/18   Barton Dubois, MD  lisinopril (PRINIVIL,ZESTRIL) 40 MG tablet Take 1 tablet (40 mg total) by mouth daily. 06/26/14   Milton Ferguson, MD  metFORMIN (GLUCOPHAGE-XR) 500 MG 24 hr tablet Take 1,000 mg by mouth 2 (two) times daily.     [provider]  oxyCODONE-acetaminophen (PERCOCET) 7.5-325 MG tablet Take 1 tablet by mouth every 6 (six) hours as needed. Patient not taking: Reported on 05/27/2019 11/24/18 11/24/19  Aviva Signs, MD  pantoprazole (PROTONIX) 40 MG tablet Take 1 tablet (40 mg total) by mouth daily. 11/24/18   Barton Dubois, MD    Family History Family History  Problem Relation Age of Onset  . Diabetes Mother   . Diabetes Father     Social History Social History    Tobacco Use  . Smoking status: Former Smoker    Packs/day: 0.50    Years: 5.00    Pack years: 2.50    Types: E-cigarettes    Quit date: 02/24/2009    Years since quitting: 10.2  . Smokeless tobacco: Never Used  Substance Use Topics  . Alcohol use: Yes    Comment: once year  . Drug use: No     Allergies   Methylphenidate derivatives   Review of Systems Review of Systems  Constitutional: Positive for fever (Subjective).  Respiratory: Positive for shortness of breath (With exertion).   Cardiovascular: Positive for chest pain (With exertion).  Gastrointestinal: Positive for abdominal pain, diarrhea, nausea and vomiting.  All other systems reviewed and are negative.    Physical Exam Updated Vital Signs BP (!) 160/119 (BP Location: Right Arm)   Pulse (!) 114   Temp 98.6 F (37 C) (Oral)   Resp 18   Ht _0  (1.803 m)   Wt (!) 145.2 kg   SpO2 98%  BMI 44.63 kg/m   Physical Exam Vitals signs and nursing note reviewed.  Constitutional:      General: He is not in acute distress.    Appearance: He is well-developed.     Comments: Appears nontoxic.  Morbidly obese.  HENT:     Head: Normocephalic and atraumatic.  Eyes:     Extraocular Movements: Extraocular movements intact.     Conjunctiva/sclera: Conjunctivae normal.     Pupils: Pupils are equal, round, and reactive to light.  Neck:     Musculoskeletal: Normal range of motion and neck supple.  Cardiovascular:     Rate and Rhythm: Regular rhythm. Tachycardia present.     Pulses: Normal pulses.     Comments: Mildly tachycardic around 110 Pulmonary:     Effort: Pulmonary effort is normal. No respiratory distress.     Breath sounds: Normal breath sounds. No wheezing.     Comments: Speaking in full sentences.  Clear lung sounds in all fields. Abdominal:     General: There is no distension.     Palpations: Abdomen is soft. There is no mass.     Tenderness: There is abdominal tenderness. There is no guarding or  rebound.     Comments: Tenderness palpation of right lower quadrant, r upper quadrant, and epigastric abdomen.  No rigidity, guarding, distention, although exam is difficult due to body habitus.  Positive psoas sign.  Negative rebound.  Musculoskeletal: Normal range of motion.  Skin:    General: Skin is warm and dry.     Capillary Refill: Capillary refill takes less than 2 seconds.  Neurological:     Mental Status: He is alert and oriented to person, place, and time.  Psychiatric:        Mood and Affect: Mood is anxious.     Comments: Pt appears anxious      ED Treatments / Results  Labs (all labs ordered are listed, but only abnormal results are displayed) Labs Reviewed  LIPASE, BLOOD - Abnormal; Notable for the following components:      Result Value   Lipase 64 (*)    All other components within normal limits  COMPREHENSIVE METABOLIC PANEL - Abnormal; Notable for the following components:   Sodium 134 (*)    Potassium 3.3 (*)    Chloride 97 (*)    Glucose, Bld 308 (*)    BUN 32 (*)    Total Protein 8.7 (*)    Total Bilirubin 1.6 (*)    All other components within normal limits  CBC - Abnormal; Notable for the following components:   WBC 11.1 (*)    All other components within normal limits  URINALYSIS, ROUTINE W REFLEX MICROSCOPIC  TROPONIN I (HIGH SENSITIVITY)    EKG EKG Interpretation  Date/Time:  Friday May 27 2019 13:54:50 EDT Ventricular Rate:  115 PR Interval:  128 QRS Duration: 88 QT Interval:  328 QTC Calculation: 453 R Axis:   84 Text Interpretation: Sinus tachycardia Otherwise normal ECG Since last tracing rate faster Confirmed by Noemi Chapel [phone]) on 05/27/2019 2:33:57 PM   Radiology No results found.  Procedures Procedures (including critical care time)  Medications Ordered in ED Medications  sodium chloride flush (NS) 0.9 % injection 3 mL (3 mLs Intravenous Not Given 05/27/19 1555)  ondansetron (ZOFRAN) injection 4 mg (4 mg  Intravenous Given 05/27/19 1629)  morphine 4 MG/ML injection 4 mg (4 mg Intravenous Given 05/27/19 1628)  sodium chloride 0.9 % bolus 1,000 mL (1,000 mLs Intravenous New  Bag/Given 05/27/19 1626)     Initial Impression / Assessment and Plan / ED Course  I have reviewed the triage vital signs and the nursing notes.  Pertinent labs & imaging results that were available during my care of the patient were reviewed by me and considered in my medical decision making (see chart for details).        Patient presented for evaluation nausea, vomiting, diarrhea, abdominal pain.  Physical exam shows patient appears nontoxic.  He is mildly tachycardic, likely related to dehydration, pain, and anxiety.  As pain is mostly right lower quadrant, consider appendicitis.  Patient with recent cholecystectomy, consider retained stone.  With epigastric pain, consider pancreatitis.  As patient reports 1 month history of shortness of breath and chest pain with exertion, consider ACS, although low suspicion due to timeframe and recent abdominal symptoms.  Will obtain labs, urine, EKG, and CT abdomen pelvis for further evaluation.  Labs show slight leukocytosis at 11.  Glucose elevated at 308, but no sign of DKA.  Patient appears slightly dehydrated with a BUN of 32.  Lipase elevated at 64.  EKG shows sinus tach, unchanged from previous.  Troponin and CT abdomen pelvis pending.  Patient signed out to H. Marshell Levan, PA-C for follow-up on CT and troponin.  If reassuring, consider delta troponin and cardiology outpatient management.   Final Clinical Impressions(s) / ED Diagnoses   Final diagnoses:  None    ED Discharge Orders    None       Franchot Heidelberg, PA-C 05/27/19 1644    Carmin Muskrat, MD 05/27/19 2342

## 2019-05-27 NOTE — ED Notes (Signed)
Per dr request - BGL checked

## 2019-05-27 NOTE — ED Triage Notes (Addendum)
Pt presents to ED with complaints of RLQ abdominal pain x 3 days, vomiting x 2 days, and diarrhea. Pt states pain has radiated into his chest at times.

## 2019-05-27 NOTE — ED Provider Notes (Signed)
Patient's care required at shift change.  Patient is a 33 year old male who presents to the emergency department with nausea, vomiting, and abdominal pain.  Patient able to get only a small amount of liquids down, not able to have solid foods.  Work-up is in progress.     CT abdomen scan and troponins are pending.  Recheck.  Patient says that he has a spasm type sensation in his lower abdomen, he was concerned that his glucose may be falling.  Capillary blood glucose showed glucose to be 262.  There was a delay in obtaining the abdominal CT scan.  Initial troponin was 5, and repeat troponin was 5.  Negative for acute cardiac event.  CT scan of the abdomen shows no acute findings within the abdomen or pelvis.  There was no mass or inflammatory changes involving the pancreas.  There is no hepatobiliary changes, or changes within the biliary tree that would go along with any biliary obstruction.  I have informed the patient of the findings up to this point.  I have asked him to see the GI specialist as well as the cardiology specialist concerning his symptoms.  A prescription for Zofran and protonix is given to the patient to use.  The patient is advised to return to the emergency department if any emergent changes in his condition, problems, or concerns.     Lily Kocher, PA-C 05/27/19 2044    Carmin Muskrat, MD 05/29/19 [phone]

## 2019-05-28 ENCOUNTER — Emergency Department (HOSPITAL_COMMUNITY): Payer: Medicare Other

## 2019-05-28 ENCOUNTER — Emergency Department (HOSPITAL_COMMUNITY)
Admission: EM | Admit: 2019-05-28 | Discharge: 2019-05-28 | Disposition: A | Discharge status: Home / Self Care | Payer: Medicare Other | Attending: Emergency Medicine | Admitting: Emergency Medicine

## 2019-05-28 ENCOUNTER — Encounter (HOSPITAL_COMMUNITY): Payer: Self-pay | Admitting: *Deleted

## 2019-05-28 ENCOUNTER — Other Ambulatory Visit: Payer: Self-pay

## 2019-05-28 DIAGNOSIS — Z87891 Personal history of nicotine dependence: Secondary | ICD-10-CM | POA: Insufficient documentation

## 2019-05-28 DIAGNOSIS — E119 Type 2 diabetes mellitus without complications: Secondary | ICD-10-CM | POA: Insufficient documentation

## 2019-05-28 DIAGNOSIS — R0789 Other chest pain: Secondary | ICD-10-CM | POA: Diagnosis not present

## 2019-05-28 DIAGNOSIS — R079 Chest pain, unspecified: Secondary | ICD-10-CM

## 2019-05-28 DIAGNOSIS — I10 Essential (primary) hypertension: Secondary | ICD-10-CM | POA: Diagnosis not present

## 2019-05-28 DIAGNOSIS — R112 Nausea with vomiting, unspecified: Secondary | ICD-10-CM | POA: Insufficient documentation

## 2019-05-28 DIAGNOSIS — R1013 Epigastric pain: Secondary | ICD-10-CM | POA: Diagnosis not present

## 2019-05-28 DIAGNOSIS — Z79899 Other long term (current) drug therapy: Secondary | ICD-10-CM | POA: Insufficient documentation

## 2019-05-28 DIAGNOSIS — Z794 Long term (current) use of insulin: Secondary | ICD-10-CM | POA: Insufficient documentation

## 2019-05-28 LAB — COMPREHENSIVE METABOLIC PANEL
ALT: 28 U/L (ref 0–44)
AST: 23 U/L (ref 15–41)
Albumin: 4.7 g/dL (ref 3.5–5.0)
Alkaline Phosphatase: 66 U/L (ref 38–126)
Anion gap: 14 (ref 5–15)
BUN: 21 mg/dL — ABNORMAL HIGH (ref 6–20)
CO2: 23 mmol/L (ref 22–32)
Calcium: 9.4 mg/dL (ref 8.9–10.3)
Chloride: 95 mmol/L — ABNORMAL LOW (ref 98–111)
Creatinine, Ser: 1 mg/dL (ref 0.61–1.24)
GFR calc Af Amer: >60 mL/min (ref >60)
GFR calc non Af Amer: >60 mL/min (ref >60)
Glucose, Bld: 304 mg/dL — ABNORMAL HIGH (ref 70–99)
Potassium: 3.2 mmol/L — ABNORMAL LOW (ref 3.5–5.1)
Sodium: 132 mmol/L — ABNORMAL LOW (ref 135–145)
Total Bilirubin: 2.6 mg/dL — ABNORMAL HIGH (ref 0.3–1.2)
Total Protein: 8.1 g/dL (ref 6.5–8.1)

## 2019-05-28 LAB — CBC
HCT: 47 % (ref 39.0–52.0)
Hemoglobin: 16.2 g/dL (ref 13.0–17.0)
MCH: 28.6 pg (ref 26.0–34.0)
MCHC: 34.5 g/dL (ref 30.0–36.0)
MCV: 83 fL (ref 80.0–100.0)
Platelets: 315 10*3/uL (ref 150–400)
RBC: 5.66 MIL/uL (ref 4.22–5.81)
RDW: 12.9 % (ref 11.5–15.5)
WBC: 10.4 10*3/uL (ref 4.0–10.5)
nRBC: 0 % (ref 0.0–0.2)

## 2019-05-28 LAB — GLUCOSE, CAPILLARY: Glucose-Capillary: 300 mg/dL — ABNORMAL HIGH (ref 70–99)

## 2019-05-28 LAB — TROPONIN I (HIGH SENSITIVITY)
Troponin I (High Sensitivity): 4 ng/L (ref <18)
Troponin I (High Sensitivity): 4 ng/L

## 2019-05-28 LAB — URINALYSIS, ROUTINE W REFLEX MICROSCOPIC
Bilirubin Urine: NEGATIVE
Glucose, UA: 150 mg/dL — AB
Hgb urine dipstick: NEGATIVE
Ketones, ur: 80 mg/dL — AB
Leukocytes,Ua: NEGATIVE
Nitrite: NEGATIVE
Protein, ur: NEGATIVE mg/dL
Specific Gravity, Urine: 1.024 (ref 1.005–1.030)
pH: 6 (ref 5.0–8.0)

## 2019-05-28 LAB — LIPASE, BLOOD: Lipase: 27 U/L (ref 11–51)

## 2019-05-28 LAB — CBG MONITORING, ED: Glucose-Capillary: 176 mg/dL — ABNORMAL HIGH (ref 70–99)

## 2019-05-28 MED ORDER — SODIUM CHLORIDE 0.9% FLUSH: 3.0000 mL | Freq: Once | INTRAVENOUS | Status: DC

## 2019-05-28 MED ORDER — METOCLOPRAMIDE HCL 10 MG PO TABS: 10.0000 mg | ORAL_TABLET | Freq: Once | ORAL | Status: DC

## 2019-05-28 MED ORDER — DIPHENHYDRAMINE HCL 12.5 MG/5ML PO ELIX: 12.5000 mg | ORAL_SOLUTION | Freq: Once | ORAL | Status: DC

## 2019-05-28 MED ORDER — METOCLOPRAMIDE HCL 5 MG/ML IJ SOLN
10.0000 mg | Freq: Once | INTRAMUSCULAR | Status: AC
  Administered 2019-05-28: 10 mg via INTRAVENOUS
  Filled 2019-05-28: qty 2

## 2019-05-28 MED ORDER — METOCLOPRAMIDE HCL 10 MG PO TABS: 10.0000 mg | ORAL_TABLET | Freq: Three times a day (TID) | ORAL | 0 refills | Status: DC

## 2019-05-28 MED ORDER — DIPHENHYDRAMINE HCL 50 MG/ML IJ SOLN
12.5000 mg | Freq: Once | INTRAMUSCULAR | Status: AC
  Administered 2019-05-28: 16:00:00 12.5 mg via INTRAVENOUS
  Filled 2019-05-28: qty 1

## 2019-05-28 MED ORDER — SODIUM CHLORIDE 0.9 % IV BOLUS
1000.0000 mL | Freq: Once | INTRAVENOUS | Status: AC
  Administered 2019-05-28: 1000 mL via INTRAVENOUS

## 2019-05-28 NOTE — ED Provider Notes (Signed)
Patient signed out to me at change of shift.  Patient is a 33 year old male who presents to the emergency department with abdominal pain, and chest pain.  The patient was noted on evaluation to have tachycardia present.  He had recurrent sharp shooting pains.  He was treated with IV Reglan and Benadryl with significant improvement.  He was noted to have hyperglycemia and was treated with IV fluids for this.  Patient's work-up is ongoing.  Patient has delta troponin pending, and patient is to have the glucose rechecked after fluids.  Delta troponin is within normal limits at 4.  Urinalysis shows a clear yellow specimen with a specific gravity 1.024, glucose is elevated at 150, ketones elevated at 80, otherwise test is within normal limits.  After IV fluids and management, glucose is down from 304 to 176. Pain improved.  No vomiting after patient received the medication.  Prescription for Reglan 3 times daily given to the patient.  Have asked the patient to follow-up with his primary physician, as well as the GI specialist he was referred to on last evening for assistance with these issues.   Lily Kocher, PA-C 05/28/19 2015    Wyvonnia Dusky, MD 05/29/19 1038

## 2019-05-28 NOTE — Discharge Instructions (Addendum)
Your abdominal discomfort and nausea seem to respond nicely to Reglan.  Please use the Reglan 3 times daily.  Please discuss this with Dr.Hasanaj, and the GI specialist you were referred to on yesterday.  Your blood glucose was elevated.  After medication and fluids it has improved significantly.  Please monitor your glucose carefully.  Please see your primary physician or return to the emergency department if there is worsening of your symptoms, or changes in your condition.

## 2019-05-28 NOTE — ED Provider Notes (Signed)
[facility] EMERGENCY DEPARTMENT Provider Note   CSN: [id number] Arrival date & time: 05/28/19  1314     History   Chief Complaint Chief Complaint  Patient presents with   Abdominal Pain   Chest Pain    HPI Kyle Collins is a 33 y.o. male.     Patient returns to ED with continuous epigastric abdominal pain, nausea and vomiting. Seen yesterday for same. Chart review shows he received a CT that showed no abnormalities and labs that were significant only for a minimally elevated lipase of 64. He returns today with persistent similar symptoms as well as sharp central chest pain that is fleeting, sharp and radiates into his back. No fever. No hematemesis. After arrival in the ED, he reports he became generally weak and unable to "hold myself up", falling to the floor requiring assistance to get back up.   The history is provided by the patient. No language interpreter was used.    Past Medical History:  Diagnosis Date   ADHD (attention deficit hyperactivity disorder)    Anxiety    Bipolar disorder ([place])    Depression    Diabetes mellitus    GERD (gastroesophageal reflux disease)    Headache    Hemochromatosis 2019   Hypertension    Polycythemia    Sleep apnea    Thyroid disease     Patient Active Problem List   Diagnosis Date Noted   Abdominal pain    Gastritis 11/22/2018   DKA (diabetic ketoacidoses) ([place]) 11/22/2018   DKA (diabetic ketoacidosis) ([place]) 11/20/2018   Sleep apnea 11/20/2018   GERD (gastroesophageal reflux disease) 11/20/2018   Polycythemia 11/20/2018   Hyperbilirubinemia 11/20/2018   Cholelithiasis 11/20/2018   S/P nasal septoplasty 08/18/2018   Fracture dislocation of right shoulder joint 12/04/2015   Dislocation, shoulder closed    Greater tuberosity of humerus fracture    Morbid obesity due to excess calories ([place]) 10/26/2015   Uncontrolled type 2 diabetes mellitus without complication, without Mapp-term current use  of insulin 07/16/2015   Essential hypertension, benign 07/16/2015   Personal history of noncompliance with medical treatment, presenting hazards to health 07/16/2015    Past Surgical History:  Procedure Laterality Date   CHOLECYSTECTOMY N/A 11/24/2018   Procedure: LAPAROSCOPIC CHOLECYSTECTOMY;  Surgeon: Aviva Signs, MD;  Location: [facility];  Service: General;  Laterality: N/A;   CIRCUMCISION N/A 09/14/2017   Procedure: Denton Meek;  Surgeon: Cleon Gustin, MD;  Location: [facility];  Service: Urology;  Laterality: N/A;  1 HR 336 [id number] - He has a Education officer, museum that needs to be called if changes made [employer] @ [facility]     scrotum   MAXILLARY ANTROSTOMY Right 08/18/2018   Procedure: ENDOSCOPIC MAXILLARY RIGHT ANTROSTOMY WITH TISSUE REMOVAL;  Surgeon: Leta Baptist, MD;  Location: [facility];  Service: ENT;  Laterality: Right;   NASAL SEPTOPLASTY W/ TURBINOPLASTY Bilateral 08/18/2018   Procedure: NASAL SEPTOPLASTY WITH TURBINATE REDUCTION;  Surgeon: Leta Baptist, MD;  Location: [facility];  Service: ENT;  Laterality: Bilateral;   NASAL SEPTUM SURGERY  08/18/2018   ORIF SHOULDER FRACTURE Right 02/28/2016   Procedure: OPEN REDUCTION INTERNAL FIXATION (ORIF) SHOULDER FRACTURE;  Surgeon: Carole Civil, MD;  Location: [facility];  Service: Orthopedics;  Laterality: Right;   SHOULDER CLOSED REDUCTION Right 12/04/2015   Procedure: CLOSED REDUCTION SHOULDER;  Surgeon: Carole Civil, MD;  Location: [facility];  Service: Orthopedics;  Laterality: Right;   TOOTH EXTRACTION  spring 2016  top left         Home Medications    Prior to Admission medications   Medication Sig Start Date End Date Taking? Authorizing Provider  amLODipine (NORVASC) 10 MG tablet Take 1 tablet (10 mg total) by mouth daily. 11/25/18   Barton Dubois, MD  blood glucose meter kit and supplies Dispense based on patient and insurance preference. Use up to four times daily as  directed. (FOR ICD-10 E10.9, E11.9). 11/24/18   Barton Dubois, MD  chlorthalidone (HYGROTON) 50 MG tablet Take 50 mg by mouth daily. 05/18/19   [provider]  citalopram (CELEXA) 20 MG tablet Take 20 mg by mouth daily.     [provider]  gemfibrozil (LOPID) 600 MG tablet Take 600 mg by mouth 2 (two) times daily. 05/25/19   [provider]  Insulin Glargine (LANTUS) 100 UNIT/ML Solostar Pen Inject 10 Units into the skin daily for 30 days. Patient taking differently: Inject 15 Units into the skin daily.  11/24/18 05/27/19  Barton Dubois, MD  Insulin Pen Needle (ABOUTTIME PEN NEEDLE) 31G X 8 MM MISC Use as needed to inject insulin daily as instructed 11/24/18   Barton Dubois, MD  lisinopril (PRINIVIL,ZESTRIL) 40 MG tablet Take 1 tablet (40 mg total) by mouth daily. 06/26/14   Milton Ferguson, MD  metFORMIN (GLUCOPHAGE) 500 MG tablet Take 1,000 mg by mouth 2 (two) times daily. 05/18/19   [provider]  ondansetron (ZOFRAN) 4 MG tablet Take 1 tablet (4 mg total) by mouth every 6 (six) hours. 05/27/19   Lily Kocher, PA-C  pantoprazole (PROTONIX) 40 MG tablet Take 1 tablet (40 mg total) by mouth daily. 05/27/19   Lily Kocher, PA-C  potassium chloride SA (KLOR-CON) 20 MEQ tablet Take 20 mEq by mouth daily.    [provider]    Family History Family History  Problem Relation Age of Onset   Diabetes Mother    Diabetes Father     Social History Social History   Tobacco Use   Smoking status: Former Smoker    Packs/day: 0.50    Years: 5.00    Pack years: 2.50    Types: E-cigarettes    Quit date: 02/24/2009    Years since quitting: 10.2   Smokeless tobacco: Never Used  Substance Use Topics   Alcohol use: Not Currently    Comment: once year   Drug use: No     Allergies   Methylphenidate derivatives   Review of Systems Review of Systems  Constitutional: Negative for chills and fever.  HENT: Negative.   Respiratory:  Negative.   Cardiovascular: Positive for chest pain.  Gastrointestinal: Positive for abdominal pain, nausea and vomiting.  Musculoskeletal: Negative.   Skin: Negative.   Neurological: Positive for weakness.     Physical Exam Updated Vital Signs BP (!) 153/102 (BP Location: Left Arm)    Pulse (!) 114    Temp 97.9 F (36.6 C) (Oral)    Resp 20    Ht '5\' 11"'$  (1.803 m)    Wt (!) 145.2 kg    SpO2 100%    BMI 44.63 kg/m   Physical Exam Vitals signs and nursing note reviewed.  Constitutional:      General: He is not in acute distress.    Appearance: He is well-developed. He is obese.  HENT:     Head: Normocephalic.  Neck:     Musculoskeletal: Normal range of motion and neck supple.  Cardiovascular:     Rate and Rhythm:  Regular rhythm. Tachycardia present.     Heart sounds: No murmur.     Comments: During exam, the patient has ongoing sharp chest pain that comes and goes. Pulmonary:     Effort: Pulmonary effort is normal.     Breath sounds: Normal breath sounds. No wheezing, rhonchi or rales.  Chest:     Chest wall: Tenderness present.  Abdominal:     General: Bowel sounds are normal.     Palpations: Abdomen is soft.     Tenderness: There is abdominal tenderness in the right lower quadrant. There is no guarding or rebound.  Musculoskeletal: Normal range of motion.  Skin:    General: Skin is warm and dry.  Neurological:     Mental Status: He is alert and oriented to person, place, and time.      ED Treatments / Results  Labs (all labs ordered are listed, but only abnormal results are displayed) Labs Reviewed  COMPREHENSIVE METABOLIC PANEL - Abnormal; Notable for the following components:      Result Value   Sodium 132 (*)    Potassium 3.2 (*)    Chloride 95 (*)    Glucose, Bld 304 (*)    BUN 21 (*)    Total Bilirubin 2.6 (*)    All other components within normal limits  GLUCOSE, CAPILLARY - Abnormal; Notable for the following components:   Glucose-Capillary 300 (*)      All other components within normal limits  CBC  LIPASE, BLOOD  URINALYSIS, ROUTINE W REFLEX MICROSCOPIC  TROPONIN I (HIGH SENSITIVITY)    EKG None  Radiology Dg Chest 2 View  Result Date: 05/28/2019 CLINICAL DATA:  Chest pain, nausea, and dizziness. EXAM: CHEST - 2 VIEW COMPARISON:  06/26/2014 FINDINGS: The heart size and mediastinal contours are within normal limits. Both lungs are clear. The visualized skeletal structures are unremarkable. IMPRESSION: Normal study. Electronically Signed   By: Marlaine Hind M.D.   On: 05/28/2019 15:00   Ct Abdomen Pelvis W Contrast  Result Date: 05/27/2019 CLINICAL DATA:  Worsening abdominal pain, vomiting, and diarrhea for 3 days. EXAM: CT ABDOMEN AND PELVIS WITH CONTRAST TECHNIQUE: Multidetector CT imaging of the abdomen and pelvis was performed using the standard protocol following bolus administration of intravenous contrast. CONTRAST:  129m OMNIPAQUE IOHEXOL 300 MG/ML  SOLN COMPARISON:  11/20/2018 FINDINGS: Lower Chest: No acute findings. Hepatobiliary: No hepatic masses identified. Mild diffuse hepatic steatosis. Prior cholecystectomy. No evidence of biliary obstruction. Pancreas:  No mass or inflammatory changes. Spleen: Within normal limits in size and appearance. Adrenals/Urinary Tract: No masses identified. No evidence of hydronephrosis. Stomach/Bowel: No evidence of obstruction, inflammatory process or abnormal fluid collections. Normal appendix visualized. Vascular/Lymphatic: No pathologically enlarged lymph nodes. No abdominal aortic aneurysm. Reproductive:  No mass or other significant abnormality. Other:  None. Musculoskeletal:  No suspicious bone lesions identified. IMPRESSION: No acute findings within the abdomen or pelvis. Mild hepatic steatosis. Electronically Signed   By: JMarlaine HindM.D.   On: 05/27/2019 19:25    Procedures Procedures (including critical care time)  Medications Ordered in ED Medications  sodium chloride flush (NS)  0.9 % injection 3 mL (has no administration in time range)  metoCLOPramide (REGLAN) injection 10 mg (has no administration in time range)  diphenhydrAMINE (BENADRYL) injection 12.5 mg (has no administration in time range)     Initial Impression / Assessment and Plan / ED Course  I have reviewed the triage vital signs and the nursing notes.  Pertinent labs & imaging  results that were available during my care of the patient were reviewed by me and considered in my medical decision making (see chart for details).        Patient to ED with persistent epigastric abdominal pain, N, V, D for 3 days similar to symptoms he has had in the past. H/O DM, no known diagnosis of gastroparesis. Today he started having chest pain that is sharp, episodic and radiates into the back.   He reports he felt better after discharge yesterday but symptoms quickly returned, including new symptom of chest pain. Initial trop is negative, EKG shows sinus tachycardia without ischemia, CXR normal without opacity or widened mediastinum to suggest dissection. No fever. No leukocytosis. VSS, mildly tachycardic.   4:00 - Recheck - patient received Reglan and benadryl and feels much better. Given risk factors of obesity, DM, HTN, feel delta troponin is appropriate.   Patient care signed out the [facility], [place], at end of shift.   Plan: review delta troponin (EKG normal, initial trop 4); recheck hyperglycemia after fluids (no sign of DKA); re-evaluate symptoms. Disposition dependent on recheck and progress. If d/ch'd home is appropriate as anticipated, GI referral felt reasonable.   Final Clinical Impressions(s) / ED Diagnoses   Final diagnoses:  None   1. Abdominal pain 2. Nausea, vomiting 3. Nonspecific chest pain  ED Discharge Orders    None       Charlann Lange, PA-C 05/28/19 1619    Wyvonnia Dusky, MD 05/29/19 1038

## 2019-05-28 NOTE — ED Triage Notes (Addendum)
Pt c/o lower abdominal pain with n/v x 3 days. Pt was waiting for triage and felt like his legs were weak and then he fell down to the ground. Pt was assisted back up into wheelchair with 2 person assist. Pt also reports mid chest pain that started 1 month ago. Pt is very anxious in triage. Pt continues to report he feels like he is going to pass out.

## 2019-06-08 DIAGNOSIS — K3184 Gastroparesis: Secondary | ICD-10-CM | POA: Diagnosis not present

## 2019-06-09 ENCOUNTER — Other Ambulatory Visit: Payer: Self-pay

## 2019-06-09 ENCOUNTER — Emergency Department (HOSPITAL_COMMUNITY): Payer: Medicare Other

## 2019-06-09 ENCOUNTER — Emergency Department (HOSPITAL_COMMUNITY)
Admission: EM | Admit: 2019-06-09 | Discharge: 2019-06-09 | Disposition: A | Discharge status: Home / Self Care | Payer: Medicare Other | Attending: Emergency Medicine | Admitting: Emergency Medicine

## 2019-06-09 ENCOUNTER — Encounter (HOSPITAL_COMMUNITY): Payer: Self-pay | Admitting: Emergency Medicine

## 2019-06-09 DIAGNOSIS — Z79899 Other long term (current) drug therapy: Secondary | ICD-10-CM | POA: Diagnosis not present

## 2019-06-09 DIAGNOSIS — R112 Nausea with vomiting, unspecified: Secondary | ICD-10-CM

## 2019-06-09 DIAGNOSIS — Z87891 Personal history of nicotine dependence: Secondary | ICD-10-CM | POA: Diagnosis not present

## 2019-06-09 DIAGNOSIS — K5900 Constipation, unspecified: Secondary | ICD-10-CM | POA: Diagnosis not present

## 2019-06-09 DIAGNOSIS — E1165 Type 2 diabetes mellitus with hyperglycemia: Secondary | ICD-10-CM | POA: Insufficient documentation

## 2019-06-09 DIAGNOSIS — Z7984 Long term (current) use of oral hypoglycemic drugs: Secondary | ICD-10-CM | POA: Diagnosis not present

## 2019-06-09 DIAGNOSIS — R739 Hyperglycemia, unspecified: Secondary | ICD-10-CM

## 2019-06-09 DIAGNOSIS — F419 Anxiety disorder, unspecified: Secondary | ICD-10-CM | POA: Diagnosis not present

## 2019-06-09 DIAGNOSIS — K3184 Gastroparesis: Secondary | ICD-10-CM

## 2019-06-09 DIAGNOSIS — E1143 Type 2 diabetes mellitus with diabetic autonomic (poly)neuropathy: Secondary | ICD-10-CM | POA: Diagnosis not present

## 2019-06-09 DIAGNOSIS — I10 Essential (primary) hypertension: Secondary | ICD-10-CM | POA: Diagnosis not present

## 2019-06-09 LAB — COMPREHENSIVE METABOLIC PANEL
ALT: 25 U/L (ref 0–44)
AST: 21 U/L (ref 15–41)
Albumin: 4.5 g/dL (ref 3.5–5.0)
Alkaline Phosphatase: 64 U/L (ref 38–126)
Anion gap: 15 (ref 5–15)
BUN: 17 mg/dL (ref 6–20)
CO2: 21 mmol/L — ABNORMAL LOW (ref 22–32)
Calcium: 9.3 mg/dL (ref 8.9–10.3)
Chloride: 100 mmol/L (ref 98–111)
Creatinine, Ser: 0.91 mg/dL (ref 0.61–1.24)
GFR calc Af Amer: >60 mL/min (ref >60)
GFR calc non Af Amer: >60 mL/min (ref >60)
Glucose, Bld: 298 mg/dL — ABNORMAL HIGH (ref 70–99)
Potassium: 3.3 mmol/L — ABNORMAL LOW (ref 3.5–5.1)
Sodium: 136 mmol/L (ref 135–145)
Total Bilirubin: 1.3 mg/dL — ABNORMAL HIGH (ref 0.3–1.2)
Total Protein: 7.8 g/dL (ref 6.5–8.1)

## 2019-06-09 LAB — CBC
HCT: 46.4 % (ref 39.0–52.0)
Hemoglobin: 15.4 g/dL (ref 13.0–17.0)
MCH: 27.9 pg (ref 26.0–34.0)
MCHC: 33.2 g/dL (ref 30.0–36.0)
MCV: 84.1 fL (ref 80.0–100.0)
Platelets: 339 10*3/uL (ref 150–400)
RBC: 5.52 MIL/uL (ref 4.22–5.81)
RDW: 13.2 % (ref 11.5–15.5)
WBC: 10.3 10*3/uL (ref 4.0–10.5)
nRBC: 0 % (ref 0.0–0.2)

## 2019-06-09 LAB — CBG MONITORING, ED
Glucose-Capillary: 292 mg/dL — ABNORMAL HIGH (ref 70–99)
Glucose-Capillary: 234 mg/dL — ABNORMAL HIGH (ref 70–99)

## 2019-06-09 LAB — URINALYSIS, ROUTINE W REFLEX MICROSCOPIC
Bilirubin Urine: NEGATIVE
Glucose, UA: >=500 mg/dL — AB
Hgb urine dipstick: NEGATIVE
Ketones, ur: 80 mg/dL — AB
Leukocytes,Ua: NEGATIVE
Nitrite: NEGATIVE
Protein, ur: 30 mg/dL — AB
Specific Gravity, Urine: 1.031 — ABNORMAL HIGH (ref 1.005–1.030)
pH: 7 (ref 5.0–8.0)

## 2019-06-09 LAB — TROPONIN I (HIGH SENSITIVITY)
Troponin I (High Sensitivity): 3 ng/L (ref <18)
Troponin I (High Sensitivity): 3 ng/L (ref <18)

## 2019-06-09 LAB — LIPASE, BLOOD: Lipase: 33 U/L (ref 11–51)

## 2019-06-09 MED ORDER — SODIUM CHLORIDE 0.9 % IV BOLUS
1000.0000 mL | Freq: Once | INTRAVENOUS | Status: AC
  Administered 2019-06-09: 17:00:00 1000 mL via INTRAVENOUS

## 2019-06-09 MED ORDER — ONDANSETRON HCL 4 MG/2ML IJ SOLN
4.0000 mg | Freq: Once | INTRAMUSCULAR | Status: AC
  Administered 2019-06-09: 4 mg via INTRAVENOUS
  Filled 2019-06-09: qty 2

## 2019-06-09 MED ORDER — LORAZEPAM 1 MG PO TABS: 1.0000 mg | ORAL_TABLET | Freq: Three times a day (TID) | ORAL | 0 refills | Status: DC | PRN

## 2019-06-09 MED ORDER — ALPRAZOLAM 0.5 MG PO TABS
1.0000 mg | ORAL_TABLET | Freq: Once | ORAL | Status: AC
  Administered 2019-06-09: 1 mg via ORAL
  Filled 2019-06-09: qty 2

## 2019-06-09 MED ORDER — ALUM & MAG HYDROXIDE-SIMETH 200-200-20 MG/5ML PO SUSP
30.0000 mL | Freq: Once | ORAL | Status: AC
  Administered 2019-06-09: 30 mL via ORAL
  Filled 2019-06-09: qty 30

## 2019-06-09 MED ORDER — INSULIN ASPART 100 UNIT/ML ~~LOC~~ SOLN
8.0000 [IU] | Freq: Once | SUBCUTANEOUS | Status: AC
  Administered 2019-06-09: 8 [IU] via SUBCUTANEOUS
  Filled 2019-06-09: qty 1

## 2019-06-09 MED ORDER — LORAZEPAM 2 MG/ML IJ SOLN
0.5000 mg | Freq: Once | INTRAMUSCULAR | Status: AC
  Administered 2019-06-09: 0.5 mg via INTRAVENOUS
  Filled 2019-06-09: qty 1

## 2019-06-09 MED ORDER — POLYETHYLENE GLYCOL 3350 17 G PO PACK: 17.0000 g | PACK | Freq: Every day | ORAL | 0 refills | Status: DC

## 2019-06-09 MED ORDER — ONDANSETRON 4 MG PO TBDP: 4.0000 mg | ORAL_TABLET | Freq: Three times a day (TID) | ORAL | 0 refills | Status: DC | PRN

## 2019-06-09 MED ORDER — SODIUM CHLORIDE 0.9% FLUSH: 3.0000 mL | Freq: Once | INTRAVENOUS | Status: DC

## 2019-06-09 NOTE — ED Provider Notes (Addendum)
[facility] EMERGENCY DEPARTMENT Provider Note   CSN: [id number] Arrival date & time: 06/09/19  1613     History   Chief Complaint Chief Complaint  Patient presents with  . Emesis    HPI Kyle Collins is a 33 y.o. male with a history of diabetes with diagnosis of gastroparesis, GERD, HTN, h/o of DKA, presenting with persistent nausea, vomiting, lower abdominal pain, chest pain along with generalized weakness and lightheadedness.  He has had poor PO intake and reduced urine production, denies dysuria or diarrhea, instead has had some constipation, last bm yesterday.  He does not check his cbg's at home.  He was seen by his pcp yesterday with these complaint, was placed on zofran but has been unable to keep down.  He has been seen here twice for the same symptoms the end of October and he never got better.  He was prescribed reglan at that time which helped the nausea/vomiting but will not take this medicine as it causes parkinsons.      The history is provided by the patient.    Past Medical History:  Diagnosis Date  . ADHD (attention deficit hyperactivity disorder)   . Anxiety   . Bipolar disorder ([place])   . Depression   . Diabetes mellitus   . GERD (gastroesophageal reflux disease)   . Headache   . Hemochromatosis 2019  . Hypertension   . Polycythemia   . Sleep apnea   . Thyroid disease     Patient Active Problem List   Diagnosis Date Noted  . Abdominal pain   . Gastritis 11/22/2018  . DKA (diabetic ketoacidoses) ([place]) 11/22/2018  . DKA (diabetic ketoacidosis) ([place]) 11/20/2018  . Sleep apnea 11/20/2018  . GERD (gastroesophageal reflux disease) 11/20/2018  . Polycythemia 11/20/2018  . Hyperbilirubinemia 11/20/2018  . Cholelithiasis 11/20/2018  . S/P nasal septoplasty 08/18/2018  . Fracture dislocation of right shoulder joint 12/04/2015  . Dislocation, shoulder closed   . Greater tuberosity of humerus fracture   . Morbid obesity due to excess calories ([place])  10/26/2015  . Uncontrolled type 2 diabetes mellitus without complication, without Barg-term current use of insulin 07/16/2015  . Essential hypertension, benign 07/16/2015  . Personal history of noncompliance with medical treatment, presenting hazards to health 07/16/2015    Past Surgical History:  Procedure Laterality Date  . CHOLECYSTECTOMY N/A 11/24/2018   Procedure: LAPAROSCOPIC CHOLECYSTECTOMY;  Surgeon: Aviva Signs, MD;  Location: [facility];  Service: General;  Laterality: N/A;  . CIRCUMCISION N/A 09/14/2017   Procedure: CIRCUMCISION;  Surgeon: Cleon Gustin, MD;  Location: [facility];  Service: Urology;  Laterality: N/A;  1 HR 336 [phone] - He has a Education officer, museum that needs to be called if changes made Mate[employer] @ [employer] [facility]  . HERNIA REPAIR     scrotum  . MAXILLARY ANTROSTOMY Right 08/18/2018   Procedure: ENDOSCOPIC MAXILLARY RIGHT ANTROSTOMY WITH TISSUE REMOVAL;  Surgeon: Leta Baptist, MD;  Location: [place];  Service: ENT;  Laterality: Right;  . NASAL SEPTOPLASTY W/ TURBINOPLASTY Bilateral 08/18/2018   Procedure: NASAL SEPTOPLASTY WITH TURBINATE REDUCTION;  Surgeon: Leta Baptist, MD;  Location: [place];  Service: ENT;  Laterality: Bilateral;  . NASAL SEPTUM SURGERY  08/18/2018  . ORIF SHOULDER FRACTURE Right 02/28/2016   Procedure: OPEN REDUCTION INTERNAL FIXATION (ORIF) SHOULDER FRACTURE;  Surgeon: Carole Civil, MD;  Location: [facility];  Service: Orthopedics;  Laterality: Right;  . SHOULDER CLOSED REDUCTION Right 12/04/2015   Procedure: CLOSED REDUCTION SHOULDER;  Surgeon:  Carole Civil, MD;  Location: [facility];  Service: Orthopedics;  Laterality: Right;  . TOOTH EXTRACTION  spring 2016   top left         Home Medications    Prior to Admission medications   Medication Sig Start Date End Date Taking? Authorizing Provider  amLODipine (NORVASC) 10 MG tablet Take 1 tablet (10 mg total) by mouth daily. 11/25/18  Yes Barton Dubois, MD  blood  glucose meter kit and supplies Dispense based on patient and insurance preference. Use up to four times daily as directed. (FOR ICD-10 E10.9, E11.9). 11/24/18  Yes Barton Dubois, MD  chlorthalidone (HYGROTON) 50 MG tablet Take 50 mg by mouth daily. 05/18/19   [provider]  citalopram (CELEXA) 20 MG tablet Take 20 mg by mouth daily.     [provider]  gemfibrozil (LOPID) 600 MG tablet Take 600 mg by mouth 2 (two) times daily. 05/25/19   [provider]  Insulin Glargine (LANTUS) 100 UNIT/ML Solostar Pen Inject 10 Units into the skin daily for 30 days. Patient taking differently: Inject 15 Units into the skin daily.  11/24/18 05/27/19  Barton Dubois, MD  Insulin Pen Needle (ABOUTTIME PEN NEEDLE) 31G X 8 MM MISC Use as needed to inject insulin daily as instructed 11/24/18   Barton Dubois, MD  lisinopril (PRINIVIL,ZESTRIL) 40 MG tablet Take 1 tablet (40 mg total) by mouth daily. 06/26/14   Milton Ferguson, MD  LORazepam (ATIVAN) 1 MG tablet Take 1 tablet (1 mg total) by mouth every 8 (eight) hours as needed for anxiety. 06/09/19   Evalee Jefferson, PA-C  metFORMIN (GLUCOPHAGE) 500 MG tablet Take 1,000 mg by mouth 2 (two) times daily. 05/18/19   [provider]  metoCLOPramide (REGLAN) 10 MG tablet Take 1 tablet (10 mg total) by mouth 3 (three) times daily before meals. 05/28/19   Lily Kocher, PA-C  ondansetron (ZOFRAN ODT) 4 MG disintegrating tablet Take 1 tablet (4 mg total) by mouth every 8 (eight) hours as needed for nausea or vomiting. 06/09/19   Evalee Jefferson, PA-C  ondansetron (ZOFRAN) 4 MG tablet Take 1 tablet (4 mg total) by mouth every 6 (six) hours. 05/27/19   Lily Kocher, PA-C  pantoprazole (PROTONIX) 40 MG tablet Take 1 tablet (40 mg total) by mouth daily. 05/27/19   Lily Kocher, PA-C  potassium chloride SA (KLOR-CON) 20 MEQ tablet Take 20 mEq by mouth daily.    [provider]    Family History Family History  Problem Relation Age of  Onset  . Diabetes Mother   . Diabetes Father     Social History Social History   Tobacco Use  . Smoking status: Former Smoker    Packs/day: 0.50    Years: 5.00    Pack years: 2.50    Types: E-cigarettes    Quit date: 02/24/2009    Years since quitting: 10.2  . Smokeless tobacco: Never Used  Substance Use Topics  . Alcohol use: Not Currently    Comment: once year  . Drug use: No     Allergies   Methylphenidate derivatives   Review of Systems Review of Systems  Constitutional: Negative for chills and fever.  HENT: Negative for congestion and sore throat.   Eyes: Negative.   Respiratory: Negative for chest tightness and shortness of breath.   Cardiovascular: Positive for chest pain.  Gastrointestinal: Positive for abdominal pain, constipation, nausea and vomiting.  Genitourinary: Negative.   Musculoskeletal: Negative for arthralgias, joint swelling and neck pain.  Skin: Negative.  Negative for rash and wound.  Neurological: Negative for dizziness, weakness, light-headedness, numbness and headaches.  Psychiatric/Behavioral: Negative.      Physical Exam Updated Vital Signs BP (!) 118/107   Pulse 89   Temp 97.6 F (36.4 C) (Tympanic)   Resp 15   Ht _0  (1.803 m)   Wt (!) 139.7 kg   SpO2 100%   BMI 42.96 kg/m   Physical Exam Vitals and nursing note reviewed.  Constitutional:      Appearance: He is well-developed.  HENT:     Head: Normocephalic and atraumatic.  Eyes:     Conjunctiva/sclera: Conjunctivae normal.  Cardiovascular:     Rate and Rhythm: Normal rate and regular rhythm.     Heart sounds: Normal heart sounds.  Pulmonary:     Effort: Pulmonary effort is normal.     Breath sounds: Normal breath sounds. No wheezing.  Abdominal:     General: Abdomen is protuberant. Bowel sounds are normal.     Palpations: Abdomen is soft. There is no mass.     Tenderness: There is generalized abdominal tenderness. There is no guarding or rebound. Negative signs  include Murphy's sign and McBurney's sign.  Musculoskeletal:        General: Normal range of motion.     Cervical back: Normal range of motion.  Skin:    General: Skin is warm and dry.  Neurological:     Mental Status: He is alert.      ED Treatments / Results  Labs (all labs ordered are listed, but only abnormal results are displayed) Labs Reviewed  COMPREHENSIVE METABOLIC PANEL - Abnormal; Notable for the following components:      Result Value   Potassium 3.3 (*)    CO2 21 (*)    Glucose, Bld 298 (*)    Total Bilirubin 1.3 (*)    All other components within normal limits  URINALYSIS, ROUTINE W REFLEX MICROSCOPIC - Abnormal; Notable for the following components:   Specific Gravity, Urine 1.031 (*)    Glucose, UA >=500 (*)    Ketones, ur 80 (*)    Protein, ur 30 (*)    Bacteria, UA RARE (*)    All other components within normal limits  CBG MONITORING, ED - Abnormal; Notable for the following components:   Glucose-Capillary 292 (*)    All other components within normal limits  CBG MONITORING, ED - Abnormal; Notable for the following components:   Glucose-Capillary 234 (*)    All other components within normal limits  LIPASE, BLOOD  CBC  TROPONIN I (HIGH SENSITIVITY)  TROPONIN I (HIGH SENSITIVITY)    EKG EKG Interpretation  Date/Time:  Thursday June 09 2019 17:20:01 EST Ventricular Rate:  68 PR Interval:    QRS Duration: 101 QT Interval:  405 QTC Calculation: 431 R Axis:   56 Text Interpretation: Sinus rhythm Borderline short PR interval Confirmed by Fredia Sorrow [phone]) on 06/09/2019 6:21:10 PM   Radiology Acute Abd Series  Result Date: 06/09/2019 CLINICAL DATA:  Constipation EXAM: DG ABDOMEN ACUTE W/ 1V CHEST COMPARISON:  None. FINDINGS: There is no evidence of dilated bowel loops or free intraperitoneal air. Moderate amount of right colonic stool. Surgical clips in the right upper quadrant. No radiopaque calculi or other significant radiographic  abnormality is seen. Heart size and mediastinal contours are within normal limits. Both lungs are clear. IMPRESSION: Moderate amount of right colonic stool. No definite evidence of obstruction. No acute cardiopulmonary disease. Electronically Signed  By: Prudencio Pair M.D.   On: 06/09/2019 22:25    Procedures Procedures (including critical care time)  Medications Ordered in ED Medications  sodium chloride flush (NS) 0.9 % injection 3 mL (3 mLs Intravenous Not Given 06/09/19 1713)  LORazepam (ATIVAN) injection 0.5 mg (0.5 mg Intravenous Given 06/09/19 1712)  ondansetron (ZOFRAN) injection 4 mg (4 mg Intravenous Given 06/09/19 1712)  sodium chloride 0.9 % bolus 1,000 mL (0 mLs Intravenous Stopped 06/09/19 1933)  insulin aspart (novoLOG) injection 8 Units (8 Units Subcutaneous Given 06/09/19 1931)  ALPRAZolam (XANAX) tablet 1 mg (1 mg Oral Given 06/09/19 2041)  alum & mag hydroxide-simeth (MAALOX/MYLANTA) 200-200-20 MG/5ML suspension 30 mL (30 mLs Oral Given 06/09/19 2129)     Initial Impression / Assessment and Plan / ED Course  I have reviewed the triage vital signs and the nursing notes.  Pertinent labs & imaging results that were available during my care of the patient were reviewed by me and considered in my medical decision making (see chart for details).        Pt with n/v, probably secondary to his known gastroparesis. Pt states was advised by pcp to not take reglan due to potential for Parkinsons SE, but has worked well at last ed visit.  Will try ODT zofran in place of tablets.  He had moderate but not complete improvement in nausea with IV zofran, no further emesis however. Labs, delta troponins negative, moderate hyperglycemia without dka.  Ct imaging completed at last ed visit 10/30, no acute findings, abd exam tonight benign, no guarding, no indication for reimaging with benign exam and normal wbc.    Prior to dc, he expressed concern constipation, reporting his n/v became  severely worse after he strained hard to have a small hard bm ytd.  Will obtain abd series before dc home.   Imaging with moderate right sided constipation, no pattern of obstruction.  miralax added, encouraged increased fiber, fluids. Plan f/u with pcp prn.  Also patient had several episodes of increased anxiety, panic while here.  He was given IV ativan which improved his sx, short course for home given, OP psych referrals given.   Final Clinical Impressions(s) / ED Diagnoses   Final diagnoses:  Gastroparesis  Hyperglycemia  Anxiety  Non-intractable vomiting with nausea, unspecified vomiting type    ED Discharge Orders         Ordered    ondansetron (ZOFRAN ODT) 4 MG disintegrating tablet  Every 8 hours PRN     06/09/19 2105    LORazepam (ATIVAN) 1 MG tablet  Every 8 hours PRN     06/09/19 2105    polyethylene glycol (MIRALAX) 17 g packet  Daily     06/09/19 2325           Evalee Jefferson, Hershal Coria 06/09/19 2334    Fredia Sorrow, MD 06/18/19 0729    Evalee Jefferson, PA-C 07/12/19 1252    Fredia Sorrow, MD 07/12/19 [phone]

## 2019-06-09 NOTE — ED Notes (Signed)
Patient transported to CT 

## 2019-06-09 NOTE — Discharge Instructions (Addendum)
Your lab tests are reassuring tonight, although your blood glucose level was initially high, but is improving with the insulin you were given.  Keep a close watch on your blood glucose levels and make sure you are taking your medicines as prescribed by your MD.  You have been prescribed a zofran that acts rapidly and dissolves in your mouth which should work better if your nausea returns.  You have been prescribed a few ativan tablets to help you with your anxiety.  This is not a permanent solution however and you are encouraged to seek treatment for your anxiety - I have given you some suggestions for this followup care.  Ativan will cause drowsiness so use caution with this medication.   Your xray does suggest some moderate constipation (right sided) but no obstruction.  I have prescribed a medicine to help with this problem.   Make sure are drinking plenty of fluids and concentrate on increasing fiber in your diet.

## 2019-06-09 NOTE — ED Notes (Signed)
Pt returned from CT °

## 2019-06-09 NOTE — ED Notes (Signed)
Pt is aware we need urine sample, urinal at bedside.  

## 2019-06-09 NOTE — ED Notes (Signed)
ED Provider at bedside. 

## 2019-06-09 NOTE — ED Triage Notes (Signed)
Pt c/o of lower abdominal pain, weakness and nausea x3 weeks

## 2019-07-05 DIAGNOSIS — D751 Secondary polycythemia: Secondary | ICD-10-CM | POA: Diagnosis not present

## 2019-07-05 DIAGNOSIS — R7989 Other specified abnormal findings of blood chemistry: Secondary | ICD-10-CM | POA: Diagnosis not present

## 2019-09-27 DIAGNOSIS — H43393 Other vitreous opacities, bilateral: Secondary | ICD-10-CM | POA: Diagnosis not present

## 2019-09-28 DIAGNOSIS — I10 Essential (primary) hypertension: Secondary | ICD-10-CM | POA: Diagnosis not present

## 2019-09-28 DIAGNOSIS — E1142 Type 2 diabetes mellitus with diabetic polyneuropathy: Secondary | ICD-10-CM | POA: Diagnosis not present

## 2019-09-28 DIAGNOSIS — K648 Other hemorrhoids: Secondary | ICD-10-CM | POA: Diagnosis not present

## 2019-09-28 DIAGNOSIS — K3184 Gastroparesis: Secondary | ICD-10-CM | POA: Diagnosis not present

## 2019-09-28 DIAGNOSIS — G4730 Sleep apnea, unspecified: Secondary | ICD-10-CM | POA: Diagnosis not present

## 2019-10-10 DIAGNOSIS — R7989 Other specified abnormal findings of blood chemistry: Secondary | ICD-10-CM | POA: Diagnosis not present

## 2019-10-10 DIAGNOSIS — Z794 Long term (current) use of insulin: Secondary | ICD-10-CM | POA: Diagnosis not present

## 2019-10-10 DIAGNOSIS — D751 Secondary polycythemia: Secondary | ICD-10-CM | POA: Diagnosis not present

## 2019-10-10 DIAGNOSIS — E1110 Type 2 diabetes mellitus with ketoacidosis without coma: Secondary | ICD-10-CM | POA: Diagnosis not present

## 2019-10-10 DIAGNOSIS — G4730 Sleep apnea, unspecified: Secondary | ICD-10-CM | POA: Diagnosis not present

## 2019-12-29 DIAGNOSIS — G4730 Sleep apnea, unspecified: Secondary | ICD-10-CM | POA: Diagnosis not present

## 2019-12-29 DIAGNOSIS — K219 Gastro-esophageal reflux disease without esophagitis: Secondary | ICD-10-CM | POA: Diagnosis not present

## 2019-12-29 DIAGNOSIS — E1142 Type 2 diabetes mellitus with diabetic polyneuropathy: Secondary | ICD-10-CM | POA: Diagnosis not present

## 2019-12-29 DIAGNOSIS — I10 Essential (primary) hypertension: Secondary | ICD-10-CM | POA: Diagnosis not present

## 2019-12-29 DIAGNOSIS — J309 Allergic rhinitis, unspecified: Secondary | ICD-10-CM | POA: Diagnosis not present

## 2020-01-05 DIAGNOSIS — Z23 Encounter for immunization: Secondary | ICD-10-CM | POA: Diagnosis not present

## 2020-02-09 DIAGNOSIS — K219 Gastro-esophageal reflux disease without esophagitis: Secondary | ICD-10-CM | POA: Diagnosis not present

## 2020-02-09 DIAGNOSIS — Z9114 Patient's other noncompliance with medication regimen: Secondary | ICD-10-CM | POA: Diagnosis not present

## 2020-02-09 DIAGNOSIS — I10 Essential (primary) hypertension: Secondary | ICD-10-CM | POA: Diagnosis not present

## 2020-02-09 DIAGNOSIS — G4733 Obstructive sleep apnea (adult) (pediatric): Secondary | ICD-10-CM | POA: Diagnosis not present

## 2020-02-09 DIAGNOSIS — D751 Secondary polycythemia: Secondary | ICD-10-CM | POA: Diagnosis not present

## 2020-02-09 DIAGNOSIS — F319 Bipolar disorder, unspecified: Secondary | ICD-10-CM | POA: Diagnosis not present

## 2020-02-09 DIAGNOSIS — Z9111 Patient's noncompliance with dietary regimen: Secondary | ICD-10-CM | POA: Diagnosis not present

## 2020-04-09 DIAGNOSIS — G4730 Sleep apnea, unspecified: Secondary | ICD-10-CM | POA: Diagnosis not present

## 2020-04-09 DIAGNOSIS — R7989 Other specified abnormal findings of blood chemistry: Secondary | ICD-10-CM | POA: Diagnosis not present

## 2020-04-09 DIAGNOSIS — J309 Allergic rhinitis, unspecified: Secondary | ICD-10-CM | POA: Diagnosis not present

## 2020-04-09 DIAGNOSIS — Z9119 Patient's noncompliance with other medical treatment and regimen: Secondary | ICD-10-CM | POA: Diagnosis not present

## 2020-04-09 DIAGNOSIS — K219 Gastro-esophageal reflux disease without esophagitis: Secondary | ICD-10-CM | POA: Diagnosis not present

## 2020-04-09 DIAGNOSIS — I10 Essential (primary) hypertension: Secondary | ICD-10-CM | POA: Diagnosis not present

## 2020-04-09 DIAGNOSIS — D751 Secondary polycythemia: Secondary | ICD-10-CM | POA: Diagnosis not present

## 2020-04-09 DIAGNOSIS — I201 Angina pectoris with documented spasm: Secondary | ICD-10-CM | POA: Diagnosis not present

## 2020-04-09 DIAGNOSIS — E1142 Type 2 diabetes mellitus with diabetic polyneuropathy: Secondary | ICD-10-CM | POA: Diagnosis not present

## 2020-04-11 DIAGNOSIS — Z7189 Other specified counseling: Secondary | ICD-10-CM | POA: Diagnosis not present

## 2020-04-11 DIAGNOSIS — D751 Secondary polycythemia: Secondary | ICD-10-CM | POA: Diagnosis not present

## 2020-04-11 DIAGNOSIS — Z9189 Other specified personal risk factors, not elsewhere classified: Secondary | ICD-10-CM | POA: Diagnosis not present

## 2020-04-11 DIAGNOSIS — G4733 Obstructive sleep apnea (adult) (pediatric): Secondary | ICD-10-CM | POA: Diagnosis not present

## 2020-04-16 ENCOUNTER — Emergency Department (HOSPITAL_COMMUNITY): Payer: Medicare Other

## 2020-04-16 ENCOUNTER — Encounter (HOSPITAL_COMMUNITY): Payer: Self-pay | Admitting: Emergency Medicine

## 2020-04-16 ENCOUNTER — Emergency Department (HOSPITAL_COMMUNITY)
Admission: EM | Admit: 2020-04-16 | Discharge: 2020-04-16 | Disposition: A | Discharge status: Home / Self Care | Payer: Medicare Other | Attending: Emergency Medicine | Admitting: Emergency Medicine

## 2020-04-16 DIAGNOSIS — R112 Nausea with vomiting, unspecified: Secondary | ICD-10-CM

## 2020-04-16 DIAGNOSIS — Z79899 Other long term (current) drug therapy: Secondary | ICD-10-CM | POA: Insufficient documentation

## 2020-04-16 DIAGNOSIS — Z87891 Personal history of nicotine dependence: Secondary | ICD-10-CM | POA: Insufficient documentation

## 2020-04-16 DIAGNOSIS — E119 Type 2 diabetes mellitus without complications: Secondary | ICD-10-CM | POA: Diagnosis not present

## 2020-04-16 DIAGNOSIS — Z7984 Long term (current) use of oral hypoglycemic drugs: Secondary | ICD-10-CM | POA: Insufficient documentation

## 2020-04-16 DIAGNOSIS — R109 Unspecified abdominal pain: Secondary | ICD-10-CM | POA: Diagnosis not present

## 2020-04-16 DIAGNOSIS — Z20822 Contact with and (suspected) exposure to covid-19: Secondary | ICD-10-CM | POA: Insufficient documentation

## 2020-04-16 DIAGNOSIS — I10 Essential (primary) hypertension: Secondary | ICD-10-CM | POA: Diagnosis not present

## 2020-04-16 DIAGNOSIS — K219 Gastro-esophageal reflux disease without esophagitis: Secondary | ICD-10-CM | POA: Insufficient documentation

## 2020-04-16 DIAGNOSIS — R823 Hemoglobinuria: Secondary | ICD-10-CM | POA: Diagnosis not present

## 2020-04-16 DIAGNOSIS — R52 Pain, unspecified: Secondary | ICD-10-CM | POA: Diagnosis not present

## 2020-04-16 DIAGNOSIS — R1084 Generalized abdominal pain: Secondary | ICD-10-CM | POA: Diagnosis not present

## 2020-04-16 DIAGNOSIS — E1165 Type 2 diabetes mellitus with hyperglycemia: Secondary | ICD-10-CM | POA: Diagnosis not present

## 2020-04-16 LAB — URINALYSIS, ROUTINE W REFLEX MICROSCOPIC
Bilirubin Urine: NEGATIVE
Glucose, UA: >=500 mg/dL — AB
Ketones, ur: 20 mg/dL — AB
Leukocytes,Ua: NEGATIVE
Nitrite: NEGATIVE
Protein, ur: 100 mg/dL — AB
RBC / HPF: >50 RBC/hpf — ABNORMAL HIGH (ref 0–5)
Specific Gravity, Urine: 1.025 (ref 1.005–1.030)
pH: 7 (ref 5.0–8.0)

## 2020-04-16 LAB — SARS CORONAVIRUS 2 BY RT PCR (HOSPITAL ORDER, PERFORMED IN ~~LOC~~ HOSPITAL LAB): SARS Coronavirus 2: NEGATIVE

## 2020-04-16 LAB — COMPREHENSIVE METABOLIC PANEL
ALT: 23 U/L (ref 0–44)
AST: 19 U/L (ref 15–41)
Albumin: 4.5 g/dL (ref 3.5–5.0)
Alkaline Phosphatase: 65 U/L (ref 38–126)
Anion gap: 15 (ref 5–15)
BUN: 11 mg/dL (ref 6–20)
CO2: 23 mmol/L (ref 22–32)
Calcium: 9.6 mg/dL (ref 8.9–10.3)
Chloride: 100 mmol/L (ref 98–111)
Creatinine, Ser: 0.84 mg/dL (ref 0.61–1.24)
GFR calc Af Amer: >60 mL/min (ref >60)
GFR calc non Af Amer: >60 mL/min (ref >60)
Glucose, Bld: 306 mg/dL — ABNORMAL HIGH (ref 70–99)
Potassium: 3.4 mmol/L — ABNORMAL LOW (ref 3.5–5.1)
Sodium: 138 mmol/L (ref 135–145)
Total Bilirubin: 1.6 mg/dL — ABNORMAL HIGH (ref 0.3–1.2)
Total Protein: 7.9 g/dL (ref 6.5–8.1)

## 2020-04-16 LAB — CBC
HCT: 51.4 % (ref 39.0–52.0)
Hemoglobin: 17.2 g/dL — ABNORMAL HIGH (ref 13.0–17.0)
MCH: 30.9 pg (ref 26.0–34.0)
MCHC: 33.5 g/dL (ref 30.0–36.0)
MCV: 92.3 fL (ref 80.0–100.0)
Platelets: 264 10*3/uL (ref 150–400)
RBC: 5.57 MIL/uL (ref 4.22–5.81)
RDW: 12.6 % (ref 11.5–15.5)
WBC: 11.6 10*3/uL — ABNORMAL HIGH (ref 4.0–10.5)
nRBC: 0 % (ref 0.0–0.2)

## 2020-04-16 LAB — CBG MONITORING, ED
Glucose-Capillary: 308 mg/dL — ABNORMAL HIGH (ref 70–99)
Glucose-Capillary: 276 mg/dL — ABNORMAL HIGH (ref 70–99)

## 2020-04-16 LAB — LIPASE, BLOOD: Lipase: 69 U/L — ABNORMAL HIGH (ref 11–51)

## 2020-04-16 MED ORDER — METOCLOPRAMIDE HCL 5 MG/ML IJ SOLN
10.0000 mg | Freq: Once | INTRAMUSCULAR | Status: AC
  Administered 2020-04-16: 10 mg via INTRAVENOUS
  Filled 2020-04-16: qty 2

## 2020-04-16 MED ORDER — ALUM & MAG HYDROXIDE-SIMETH 200-200-20 MG/5ML PO SUSP
30.0000 mL | Freq: Once | ORAL | Status: AC
  Administered 2020-04-16: 30 mL via ORAL
  Filled 2020-04-16: qty 30

## 2020-04-16 MED ORDER — PROMETHAZINE HCL 25 MG/ML IJ SOLN
25.0000 mg | Freq: Once | INTRAMUSCULAR | Status: AC
  Administered 2020-04-16: 25 mg via INTRAVENOUS
  Filled 2020-04-16: qty 1

## 2020-04-16 MED ORDER — ONDANSETRON HCL 4 MG/2ML IJ SOLN: 4.0000 mg | Freq: Once | INTRAMUSCULAR | Status: DC | PRN

## 2020-04-16 MED ORDER — PROMETHAZINE HCL 25 MG RE SUPP: 25.0000 mg | Freq: Four times a day (QID) | RECTAL | 0 refills | Status: DC | PRN

## 2020-04-16 MED ORDER — IOHEXOL 300 MG/ML  SOLN
100.0000 mL | Freq: Once | INTRAMUSCULAR | Status: AC | PRN
  Administered 2020-04-16: 100 mL via INTRAVENOUS

## 2020-04-16 MED ORDER — SODIUM CHLORIDE 0.9 % IV BOLUS
1000.0000 mL | Freq: Once | INTRAVENOUS | Status: AC
  Administered 2020-04-16: 1000 mL via INTRAVENOUS

## 2020-04-16 NOTE — ED Provider Notes (Signed)
[facility] EMERGENCY DEPARTMENT Provider Note   CSN: [id number] Arrival date & time: 04/16/20  1426     History Chief Complaint  Patient presents with  . Emesis    Kyle Collins is a 34 y.o. male with past medical history significant for bipolar disorder, type II diabetic on insulin and Metformin, gastroparesis, GERD, hemochromatosis, hypertension, polycythemia.  Patient had Covid vaccinations. Abdominal surgical history includes cholecystectomy.  HPI Patient presents to emergency department today via EMS with chief complaint of abdominal pain and emesis x 1 day. Patient admits too numerous episode of emesis to count prior to arrival. Also had 1 episode of non bloody diarrhea last night. He is also reporting pain in his right lower quadrant.  Pain has been constant and is worse with movement. He states pain feels different than his abdominal pain in the past with his gastroparesis. He states ever since he started vomiting today his abdominal pain has become generalized. He has been unable to tolerate any p.o. intake today secondary to nausea and emesis.  Tried taking p.o. Zofran at home without symptom improvement.  Patient was given IV Zofran by EMS however has continued to have persistent nausea and emesis. Glucose 300 for EMS. Patient does not check glucose at home.  No sick contacts.  No suspicious food intake.  No recent travel.  No known Covid contacts. Patient admitting to chest pain that started while sitting here in exam room. Pain is located in the middle of his chest, does not radiate. Feels like soreness.     Past Medical History:  Diagnosis Date  . ADHD (attention deficit hyperactivity disorder)   . Anxiety   . Bipolar disorder ([place])   . Depression   . Diabetes mellitus   . GERD (gastroesophageal reflux disease)   . Headache   . Hemochromatosis 2019  . Hypertension   . Polycythemia   . Sleep apnea   . Thyroid disease     Patient Active Problem List   Diagnosis Date  Noted  . Abdominal pain   . Gastritis 11/22/2018  . DKA (diabetic ketoacidoses) ([place]) 11/22/2018  . DKA (diabetic ketoacidosis) ([place]) 11/20/2018  . Sleep apnea 11/20/2018  . GERD (gastroesophageal reflux disease) 11/20/2018  . Polycythemia 11/20/2018  . Hyperbilirubinemia 11/20/2018  . Cholelithiasis 11/20/2018  . S/P nasal septoplasty 08/18/2018  . Fracture dislocation of right shoulder joint 12/04/2015  . Dislocation, shoulder closed   . Greater tuberosity of humerus fracture   . Morbid obesity due to excess calories ([place]) 10/26/2015  . Uncontrolled type 2 diabetes mellitus without complication, without [place]-term current use of insulin 07/16/2015  . Essential hypertension, benign 07/16/2015  . Personal history of noncompliance with medical treatment, presenting hazards to health 07/16/2015    Past Surgical History:  Procedure Laterality Date  . CHOLECYSTECTOMY N/A 11/24/2018   Procedure: LAPAROSCOPIC CHOLECYSTECTOMY;  Surgeon: Aviva Signs, MD;  Location: [facility];  Service: General;  Laterality: N/A;  . CIRCUMCISION N/A 09/14/2017   Procedure: CIRCUMCISION;  Surgeon: Cleon Gustin, MD;  Location: [facility];  Service: Urology;  Laterality: N/A;  1 HR 336 [phone] - He has a Education officer, museum that needs to be called if changes made [place] @ [place] [facility]  . HERNIA REPAIR     scrotum  . MAXILLARY ANTROSTOMY Right 08/18/2018   Procedure: ENDOSCOPIC MAXILLARY RIGHT ANTROSTOMY WITH TISSUE REMOVAL;  Surgeon: Leta Baptist, MD;  Location: [place];  Service: ENT;  Laterality: Right;  . NASAL SEPTOPLASTY W/ TURBINOPLASTY Bilateral  08/18/2018   Procedure: NASAL SEPTOPLASTY WITH TURBINATE REDUCTION;  Surgeon: Leta Baptist, MD;  Location: [place];  Service: ENT;  Laterality: Bilateral;  . NASAL SEPTUM SURGERY  08/18/2018  . ORIF SHOULDER FRACTURE Right 02/28/2016   Procedure: OPEN REDUCTION INTERNAL FIXATION (ORIF) SHOULDER FRACTURE;  Surgeon: Carole Civil, MD;   Location: [facility];  Service: Orthopedics;  Laterality: Right;  . SHOULDER CLOSED REDUCTION Right 12/04/2015   Procedure: CLOSED REDUCTION SHOULDER;  Surgeon: Carole Civil, MD;  Location: [facility];  Service: Orthopedics;  Laterality: Right;  . TOOTH EXTRACTION  spring 2016   top left        Family History  Problem Relation Age of Onset  . Diabetes Mother   . Diabetes Father     Social History   Tobacco Use  . Smoking status: Former Smoker    Packs/day: 0.50    Years: 5.00    Pack years: 2.50    Types: E-cigarettes    Quit date: 02/24/2009    Years since quitting: 11.1  . Smokeless tobacco: Never Used  Vaping Use  . Vaping Use: Some days  Substance Use Topics  . Alcohol use: Not Currently    Comment: once year  . Drug use: No    Home Medications Prior to Admission medications   Medication Sig Start Date End Date Taking? Authorizing Provider  amLODipine (NORVASC) 10 MG tablet Take 1 tablet (10 mg total) by mouth daily. 11/25/18   Barton Dubois, MD  blood glucose meter kit and supplies Dispense based on patient and insurance preference. Use up to four times daily as directed. (FOR ICD-10 E10.9, E11.9). 11/24/18   Barton Dubois, MD  chlorthalidone (HYGROTON) 50 MG tablet Take 50 mg by mouth daily. 05/18/19   [provider]  citalopram (CELEXA) 20 MG tablet Take 20 mg by mouth daily.     [provider]  gemfibrozil (LOPID) 600 MG tablet Take 600 mg by mouth 2 (two) times daily. 05/25/19   [provider]  Insulin Glargine (LANTUS) 100 UNIT/ML Solostar Pen Inject 10 Units into the skin daily for 30 days. Patient taking differently: Inject 15 Units into the skin daily.  11/24/18 05/27/19  Barton Dubois, MD  Insulin Pen Needle (ABOUTTIME PEN NEEDLE) 31G X 8 MM MISC Use as needed to inject insulin daily as instructed 11/24/18   Barton Dubois, MD  lisinopril (PRINIVIL,ZESTRIL) 40 MG tablet Take 1 tablet (40 mg total) by mouth daily. 06/26/14   Milton Ferguson, MD  LORazepam (ATIVAN) 1 MG tablet Take 1 tablet (1 mg total) by mouth every 8 (eight) hours as needed for anxiety. 06/09/19   Evalee Jefferson, PA-C  metFORMIN (GLUCOPHAGE) 500 MG tablet Take 1,000 mg by mouth 2 (two) times daily. 05/18/19   [provider]  metoCLOPramide (REGLAN) 10 MG tablet Take 1 tablet (10 mg total) by mouth 3 (three) times daily before meals. 05/28/19   Lily Kocher, PA-C  ondansetron (ZOFRAN ODT) 4 MG disintegrating tablet Take 1 tablet (4 mg total) by mouth every 8 (eight) hours as needed for nausea or vomiting. 06/09/19   Evalee Jefferson, PA-C  ondansetron (ZOFRAN) 4 MG tablet Take 1 tablet (4 mg total) by mouth every 6 (six) hours. 05/27/19   Lily Kocher, PA-C  pantoprazole (PROTONIX) 40 MG tablet Take 1 tablet (40 mg total) by mouth daily. 05/27/19   Lily Kocher, PA-C  polyethylene glycol (MIRALAX) 17 g packet Take 17 g by mouth daily. 06/09/19   Evalee Jefferson,  PA-C  potassium chloride SA (KLOR-CON) 20 MEQ tablet Take 20 mEq by mouth daily.    [provider]  promethazine (PHENERGAN) 25 MG suppository Place 1 suppository (25 mg total) rectally every 6 (six) hours as needed for nausea or vomiting. 04/16/20   Tanay Misuraca, Harley Hallmark, PA-C    Allergies    Methylphenidate derivatives  Review of Systems   Review of Systems All other systems are reviewed and are negative for acute change except as noted in the HPI.  Physical Exam Updated Vital Signs BP (!) 163/122 (BP Location: Right Arm)   Pulse 98   Temp (!) 97.1 F (36.2 C) (Temporal)   Resp 20   Ht $R'5\' 11"'Sl$  (1.803 m)   Wt (!) 137.4 kg   BMI 42.26 kg/m   Physical Exam Vitals and nursing note reviewed.  Constitutional:      General: He is not in acute distress.    Appearance: He is obese. He is ill-appearing.  HENT:     Head: Normocephalic and atraumatic.     Right Ear: Tympanic membrane and external ear normal.     Left Ear: Tympanic membrane and external ear normal.     Nose: Nose  normal.     Mouth/Throat:     Mouth: Mucous membranes are dry.     Pharynx: Oropharynx is clear.  Eyes:     General: No scleral icterus.       Right eye: No discharge.        Left eye: No discharge.     Extraocular Movements: Extraocular movements intact.     Conjunctiva/sclera: Conjunctivae normal.     Pupils: Pupils are equal, round, and reactive to light.  Neck:     Vascular: No JVD.  Cardiovascular:     Rate and Rhythm: Normal rate and regular rhythm.     Pulses: Normal pulses.          Radial pulses are 2+ on the right side and 2+ on the left side.     Heart sounds: Normal heart sounds.  Pulmonary:     Comments: Lungs clear to auscultation in all fields. Symmetric chest rise. No wheezing, rales, or rhonchi. Abdominal:     Tenderness: There is no right CVA tenderness or left CVA tenderness.     Comments: Abdomen is soft, non-distended. Tender to palpation of RLQ. No rigidity, no guarding. No peritoneal signs.  Musculoskeletal:        General: Normal range of motion.     Cervical back: Normal range of motion.  Skin:    General: Skin is warm and dry.     Capillary Refill: Capillary refill takes less than 2 seconds.  Neurological:     Mental Status: He is oriented to person, place, and time.     GCS: GCS eye subscore is 4. GCS verbal subscore is 5. GCS motor subscore is 6.     Comments: Fluent speech, no facial droop.  Psychiatric:        Behavior: Behavior normal.     ED Results / Procedures / Treatments   Labs (all labs ordered are listed, but only abnormal results are displayed) Labs Reviewed  LIPASE, BLOOD - Abnormal; Notable for the following components:      Result Value   Lipase 69 (*)    All other components within normal limits  COMPREHENSIVE METABOLIC PANEL - Abnormal; Notable for the following components:   Potassium 3.4 (*)    Glucose, Bld 306 (*)  Total Bilirubin 1.6 (*)    All other components within normal limits  URINALYSIS, ROUTINE W REFLEX  MICROSCOPIC - Abnormal; Notable for the following components:   APPearance HAZY (*)    Glucose, UA >=500 (*)    Hgb urine dipstick LARGE (*)    Ketones, ur 20 (*)    Protein, ur 100 (*)    RBC / HPF >50 (*)    Bacteria, UA RARE (*)    All other components within normal limits  CBC - Abnormal; Notable for the following components:   WBC 11.6 (*)    Hemoglobin 17.2 (*)    All other components within normal limits  CBG MONITORING, ED - Abnormal; Notable for the following components:   Glucose-Capillary 308 (*)    All other components within normal limits  CBG MONITORING, ED - Abnormal; Notable for the following components:   Glucose-Capillary 276 (*)    All other components within normal limits  SARS CORONAVIRUS 2 BY RT PCR [facility] [place] ORDER, [place] LAB)    EKG EKG Interpretation  Date/Time:  Monday April 16 2020 14:56:28 EDT Ventricular Rate:  83 PR Interval:  142 QRS Duration: 94 QT Interval:  396 QTC Calculation: 465 R Axis:   53 Text Interpretation: Normal sinus rhythm Normal ECG No STEMI Confirmed by Nanda Quinton [phone]) on 04/16/2020 4:58:09 PM   Radiology CT ABDOMEN PELVIS W CONTRAST  Result Date: 04/16/2020 CLINICAL DATA:  Right lower quadrant abdominal pain. EXAM: CT ABDOMEN AND PELVIS WITH CONTRAST TECHNIQUE: Multidetector CT imaging of the abdomen and pelvis was performed using the standard protocol following bolus administration of intravenous contrast. CONTRAST:  154mL OMNIPAQUE IOHEXOL 300 MG/ML  SOLN COMPARISON:  CT scan 05/27/2019 FINDINGS: Lower chest: The lung bases are clear of acute process. No pleural effusion or pulmonary lesions. The heart is normal in size. No pericardial effusion. The distal esophagus and aorta are unremarkable. Hepatobiliary: No hepatic lesions or intrahepatic biliary dilatation. The gallbladder is surgically absent. No common bile duct dilatation. Pancreas: No mass, inflammation or ductal dilatation. Spleen:  Normal size.  No focal lesions. Adrenals/Urinary Tract: The adrenal glands and kidneys are unremarkable. No worrisome renal lesions or hydronephrosis. Bladder is unremarkable. Stomach/Bowel: The stomach, duodenum, small bowel and colon are unremarkable. No acute inflammatory changes, mass lesions or obstructive findings. The terminal ileum is normal. The appendix is normal. Vascular/Lymphatic: The aorta is normal in caliber. No dissection. The branch vessels are patent. The major venous structures are patent. No mesenteric or retroperitoneal mass or adenopathy. Small scattered lymph nodes are noted. Reproductive: The prostate gland and seminal vesicles are unremarkable. Other: Small oblong fatty density near the sigmoid mesocolon measures a maximum of 3 cm. It is unchanged since the prior study and could be an old mesenteric infarct. No surrounding inflammatory changes or other abnormality. Musculoskeletal: No significant bony findings. Incidental bilateral pars defects at L5 but no spondylolisthesis. IMPRESSION: 1. No acute abdominal/pelvic findings, mass lesions or adenopathy. 2. Status post cholecystectomy. No biliary dilatation. Electronically Signed   By: Marijo Sanes M.D.   On: 04/16/2020 17:00    Procedures Procedures (including critical care time)  Medications Ordered in ED Medications  alum & mag hydroxide-simeth (MAALOX/MYLANTA) 200-200-20 MG/5ML suspension 30 mL (has no administration in time range)  sodium chloride 0.9 % bolus 1,000 mL (0 mLs Intravenous Stopped 04/16/20 1918)  promethazine (PHENERGAN) injection 25 mg (25 mg Intravenous Given 04/16/20 1543)  iohexol (OMNIPAQUE) 300 MG/ML solution 100 mL (100 mLs  Intravenous Contrast Given 04/16/20 1638)  metoCLOPramide (REGLAN) injection 10 mg (10 mg Intravenous Given 04/16/20 2004)    ED Course  I have reviewed the triage vital signs and the nursing notes.  Pertinent labs & imaging results that were available during my care of the patient  were reviewed by me and considered in my medical decision making (see chart for details).    MDM Rules/Calculators/A&P                          History provided by patient with additional history obtained from chart review.    Symptoms likely related to gastroparesis, however as he is tender on exam to RLQ, CT AP ordered.  Also appears dehydrated, mucous membranes are dry.  CBC shows leukocytosis 11.6, no anemia. CMP with slight hypokalemia with potassium of 3.4, no other significant electrolyte derangement. Anion gap within normal range. Hyperglycemia with glucose 306. Not suggestive of DKA. IVF given. CBG rechecked and is trending down at 276. Lipase is mildly elevated here at 69. EKG shows normal sinus rhythm. Chest pain is atypical and not suggestive of ACS.  Antiemetics given. GI cocktail given.  UA shows 20 ketones, large hemoglobinuria and >50 RBC. He does not have history of the same. Patient will need to follow up with urology outpatient. No signs of UTI.  CT AP shows no acute findings, normal appendix. No signs of kidney stones or kidney lesions, no hydronephrosis. Covid test is negative. He is tolerating PO intake. Pain is controlled. Engaged in shared decision making and patient feels he can manage symptoms at home. Will discharge with prescription for phenergan suppository.   Kyle Collins was evaluated in Emergency Department on 04/16/2020 for the symptoms described in the history of present illness. He was evaluated in the context of the global COVID-19 pandemic, which necessitated consideration that the patient might be at risk for infection with the SARS-CoV-2 virus that causes COVID-19. Institutional protocols and algorithms that pertain to the evaluation of patients at risk for COVID-19 are in a state of rapid change based on information released by regulatory bodies including the CDC and federal and state organizations. These policies and algorithms were followed during the  patient's care in the ED.  The patient appears reasonably screened and/or stabilized for discharge and I doubt any other medical condition or other [facility] requiring further screening, evaluation, or treatment in the ED at this time prior to discharge. The patient is safe for discharge with strict return precautions discussed. Recommend pcp follow up as well as urology. Findings and plan of care discussed with supervising physician Dr. Laverta Baltimore.   Portions of this note were generated with [employer]. Dictation errors may occur despite best attempts at proofreading.   Final Clinical Impression(s) / ED Diagnoses Final diagnoses:  Abdominal pain, unspecified abdominal location  Non-intractable vomiting with nausea, unspecified vomiting type  Hemoglobinuria    Rx / DC Orders ED Discharge Orders         Ordered    promethazine (PHENERGAN) 25 MG suppository  Every 6 hours PRN        04/16/20 1924           Flint Melter 04/16/20 2020    Margette Fast, MD 04/17/20 0041

## 2020-04-16 NOTE — ED Notes (Signed)
Pt given water to drink and able to keep in down at this time.

## 2020-04-16 NOTE — ED Triage Notes (Signed)
Patient brought in by [facility] EMS for abdominal pain and vomiting. Per patient started this am.  Blood sugar 300 performed by EMS.  Patient currently in vertical vomiting with c/o of chest pain.

## 2020-04-16 NOTE — Discharge Instructions (Addendum)
Follow up with [facility] because of the blood in your urine. Call the office to schedule next available follow up appointment   Prescription sent to your pharmacy for phenergan suppository. This is for nausea. Take as prescribed if needed.  Follow up with primary care doctor as needed  Return to emergency department today if symptoms worsen.

## 2020-04-18 ENCOUNTER — Other Ambulatory Visit: Payer: Self-pay

## 2020-04-18 ENCOUNTER — Emergency Department (HOSPITAL_COMMUNITY): Payer: Medicare Other

## 2020-04-18 ENCOUNTER — Emergency Department (HOSPITAL_COMMUNITY)
Admission: EM | Admit: 2020-04-18 | Discharge: 2020-04-18 | Disposition: A | Discharge status: Home / Self Care | Payer: Medicare Other | Attending: Emergency Medicine | Admitting: Emergency Medicine

## 2020-04-18 DIAGNOSIS — E1110 Type 2 diabetes mellitus with ketoacidosis without coma: Secondary | ICD-10-CM | POA: Insufficient documentation

## 2020-04-18 DIAGNOSIS — Z7984 Long term (current) use of oral hypoglycemic drugs: Secondary | ICD-10-CM | POA: Diagnosis not present

## 2020-04-18 DIAGNOSIS — R112 Nausea with vomiting, unspecified: Secondary | ICD-10-CM | POA: Diagnosis not present

## 2020-04-18 DIAGNOSIS — Z87891 Personal history of nicotine dependence: Secondary | ICD-10-CM | POA: Diagnosis not present

## 2020-04-18 DIAGNOSIS — R0689 Other abnormalities of breathing: Secondary | ICD-10-CM | POA: Diagnosis not present

## 2020-04-18 DIAGNOSIS — Z20822 Contact with and (suspected) exposure to covid-19: Secondary | ICD-10-CM | POA: Diagnosis not present

## 2020-04-18 DIAGNOSIS — R000 Tachycardia, unspecified: Secondary | ICD-10-CM | POA: Diagnosis not present

## 2020-04-18 DIAGNOSIS — Z79899 Other long term (current) drug therapy: Secondary | ICD-10-CM | POA: Diagnosis not present

## 2020-04-18 DIAGNOSIS — I10 Essential (primary) hypertension: Secondary | ICD-10-CM | POA: Diagnosis not present

## 2020-04-18 DIAGNOSIS — R739 Hyperglycemia, unspecified: Secondary | ICD-10-CM

## 2020-04-18 DIAGNOSIS — R0789 Other chest pain: Secondary | ICD-10-CM | POA: Diagnosis not present

## 2020-04-18 DIAGNOSIS — E1165 Type 2 diabetes mellitus with hyperglycemia: Secondary | ICD-10-CM | POA: Insufficient documentation

## 2020-04-18 DIAGNOSIS — R079 Chest pain, unspecified: Secondary | ICD-10-CM | POA: Diagnosis not present

## 2020-04-18 LAB — COMPREHENSIVE METABOLIC PANEL
ALT: 25 U/L (ref 0–44)
ALT: 20 U/L (ref 0–44)
AST: 22 U/L (ref 15–41)
AST: 19 U/L (ref 15–41)
Albumin: 4.9 g/dL (ref 3.5–5.0)
Albumin: 4 g/dL (ref 3.5–5.0)
Alkaline Phosphatase: 68 U/L (ref 38–126)
Alkaline Phosphatase: 55 U/L (ref 38–126)
Anion gap: 19 — ABNORMAL HIGH (ref 5–15)
Anion gap: 10 (ref 5–15)
BUN: 15 mg/dL (ref 6–20)
BUN: 12 mg/dL (ref 6–20)
CO2: 24 mmol/L (ref 22–32)
CO2: 25 mmol/L (ref 22–32)
Calcium: 10 mg/dL (ref 8.9–10.3)
Calcium: 8.5 mg/dL — ABNORMAL LOW (ref 8.9–10.3)
Chloride: 92 mmol/L — ABNORMAL LOW (ref 98–111)
Chloride: 100 mmol/L (ref 98–111)
Creatinine, Ser: 1 mg/dL (ref 0.61–1.24)
Creatinine, Ser: 0.74 mg/dL (ref 0.61–1.24)
GFR calc Af Amer: >60 mL/min (ref >60)
GFR calc Af Amer: >60 mL/min (ref >60)
GFR calc non Af Amer: >60 mL/min (ref >60)
GFR calc non Af Amer: >60 mL/min (ref >60)
Glucose, Bld: 244 mg/dL — ABNORMAL HIGH (ref 70–99)
Glucose, Bld: 165 mg/dL — ABNORMAL HIGH (ref 70–99)
Potassium: 3.2 mmol/L — ABNORMAL LOW (ref 3.5–5.1)
Potassium: 3.4 mmol/L — ABNORMAL LOW (ref 3.5–5.1)
Sodium: 135 mmol/L (ref 135–145)
Sodium: 135 mmol/L (ref 135–145)
Total Bilirubin: 2.4 mg/dL — ABNORMAL HIGH (ref 0.3–1.2)
Total Bilirubin: 1.9 mg/dL — ABNORMAL HIGH (ref 0.3–1.2)
Total Protein: 8.5 g/dL — ABNORMAL HIGH (ref 6.5–8.1)
Total Protein: 7.2 g/dL (ref 6.5–8.1)

## 2020-04-18 LAB — CBC WITH DIFFERENTIAL/PLATELET
Abs Immature Granulocytes: 0.04 10*3/uL (ref 0.00–0.07)
Basophils Absolute: 0.1 10*3/uL (ref 0.0–0.1)
Basophils Relative: 0 %
Eosinophils Absolute: 0 10*3/uL (ref 0.0–0.5)
Eosinophils Relative: 0 %
HCT: 51.7 % (ref 39.0–52.0)
Hemoglobin: 17.9 g/dL — ABNORMAL HIGH (ref 13.0–17.0)
Immature Granulocytes: 0 %
Lymphocytes Relative: 27 %
Lymphs Abs: 3 10*3/uL (ref 0.7–4.0)
MCH: 30.8 pg (ref 26.0–34.0)
MCHC: 34.6 g/dL (ref 30.0–36.0)
MCV: 89 fL (ref 80.0–100.0)
Monocytes Absolute: 0.8 10*3/uL (ref 0.1–1.0)
Monocytes Relative: 7 %
Neutro Abs: 7.4 10*3/uL (ref 1.7–7.7)
Neutrophils Relative %: 66 %
Platelets: 310 10*3/uL (ref 150–400)
RBC: 5.81 MIL/uL (ref 4.22–5.81)
RDW: 12.6 % (ref 11.5–15.5)
WBC: 11.3 10*3/uL — ABNORMAL HIGH (ref 4.0–10.5)
nRBC: 0 % (ref 0.0–0.2)

## 2020-04-18 LAB — LIPASE, BLOOD: Lipase: 34 U/L (ref 11–51)

## 2020-04-18 LAB — URINALYSIS, ROUTINE W REFLEX MICROSCOPIC
Bilirubin Urine: NEGATIVE
Glucose, UA: NEGATIVE mg/dL
Ketones, ur: 80 mg/dL — AB
Leukocytes,Ua: NEGATIVE
Nitrite: NEGATIVE
Protein, ur: 100 mg/dL — AB
RBC / HPF: >50 RBC/hpf — ABNORMAL HIGH (ref 0–5)
Specific Gravity, Urine: 1.027 (ref 1.005–1.030)
pH: 7 (ref 5.0–8.0)

## 2020-04-18 LAB — TROPONIN I (HIGH SENSITIVITY): Troponin I (High Sensitivity): 6 ng/L (ref <18)

## 2020-04-18 LAB — SARS CORONAVIRUS 2 BY RT PCR (HOSPITAL ORDER, PERFORMED IN ~~LOC~~ HOSPITAL LAB): SARS Coronavirus 2: NEGATIVE

## 2020-04-18 MED ORDER — SODIUM CHLORIDE 0.9 % IV BOLUS
1000.0000 mL | Freq: Once | INTRAVENOUS | Status: AC
  Administered 2020-04-18: 1000 mL via INTRAVENOUS

## 2020-04-18 MED ORDER — METOCLOPRAMIDE HCL 5 MG/ML IJ SOLN
10.0000 mg | Freq: Once | INTRAMUSCULAR | Status: AC
  Administered 2020-04-18: 10 mg via INTRAVENOUS
  Filled 2020-04-18: qty 2

## 2020-04-18 MED ORDER — POTASSIUM CHLORIDE CRYS ER 20 MEQ PO TBCR
20.0000 meq | EXTENDED_RELEASE_TABLET | Freq: Once | ORAL | Status: AC
  Administered 2020-04-18: 20 meq via ORAL
  Filled 2020-04-18: qty 1

## 2020-04-18 MED ORDER — LORAZEPAM 1 MG PO TABS
1.0000 mg | ORAL_TABLET | Freq: Once | ORAL | Status: AC
  Administered 2020-04-18: 1 mg via ORAL
  Filled 2020-04-18: qty 1

## 2020-04-18 MED ORDER — POLYETHYLENE GLYCOL 3350 17 G PO PACK: 17.0000 g | PACK | Freq: Every day | ORAL | 0 refills | Status: DC

## 2020-04-18 MED ORDER — AMLODIPINE BESYLATE 5 MG PO TABS
10.0000 mg | ORAL_TABLET | Freq: Once | ORAL | Status: AC
  Administered 2020-04-18: 10 mg via ORAL
  Filled 2020-04-18: qty 2

## 2020-04-18 MED ORDER — LISINOPRIL 10 MG PO TABS
40.0000 mg | ORAL_TABLET | Freq: Once | ORAL | Status: AC
  Administered 2020-04-18: 40 mg via ORAL
  Filled 2020-04-18: qty 4

## 2020-04-18 MED ORDER — METOCLOPRAMIDE HCL 10 MG PO TABS: 10.0000 mg | ORAL_TABLET | Freq: Four times a day (QID) | ORAL | 0 refills | Status: DC | PRN

## 2020-04-18 NOTE — Discharge Instructions (Signed)
Make sure you are taking your home medications as prescribed by your doctor.  You have been prescribed Reglan for use if needed for continued nausea or vomiting.  Your blood pressure is very high here, it was improved after given you your missed morning blood pressure medications, make sure you take your evening medications.  You have a prescription for Phenergan suppositories which were prescribed to your pharmacy when you were here on Monday, if you start vomiting again and you are unable to keep down the Reglan, you can try this Phenergan suppository for nausea.  Plan to see your doctor for recheck within 1 week.  You have also been prescribed MiraLAX to help you with your constipation, although your CT scan from Monday did not show you to have significant constipation, this will still help you avoid having to strain to have a bowel movement.

## 2020-04-18 NOTE — ED Notes (Signed)
UA COLLECTED AND SENT TO LAB ?

## 2020-04-18 NOTE — ED Notes (Signed)
PA aware of bp and discussed with EDP.

## 2020-04-18 NOTE — ED Provider Notes (Addendum)
[facility] EMERGENCY DEPARTMENT Provider Note   CSN: [id number] Arrival date & time: 04/18/20  1126     History Chief Complaint  Patient presents with  . Emesis    Kyle Collins is a 34 y.o. male with a history of bipolar disorder, anxiety, diabetes, GERD, HTN and polycythemia, surgical history significant for cholecystectomy presenting with persistent intractable nausea and vomiting since last seen for same here 2 days ago. He also endorses right sided chest pain which occurred early today and sharp in character.  States he has had intermittent episodes of migrating random chest pain for months and is anticipating cardiac eval next month.   He reports abdominal soreness from the vomiting and dry heaving, but no abdominal pain.  He feels weak and dehydrated and reports has been unable to keep any of his medications down x 2 days.  He has no diarrhea, denies fevers or chills, no sob. He has found no alleviators.  He was prescribed phenergan suppositories 2 days ago, was unaware of this prescription.  He has never been told he has diabetic gastroparesis but endorses reglan works the best for his nausea.  He used to smoke marijuana when a teenager. Currently uses CBD products, no THC.     The history is provided by the patient.       Past Medical History:  Diagnosis Date  . ADHD (attention deficit hyperactivity disorder)   . Anxiety   . Bipolar disorder ([place])   . Depression   . Diabetes mellitus   . GERD (gastroesophageal reflux disease)   . Headache   . Hemochromatosis 2019  . Hypertension   . Polycythemia   . Sleep apnea   . Thyroid disease     Patient Active Problem List   Diagnosis Date Noted  . Abdominal pain   . Gastritis 11/22/2018  . DKA (diabetic ketoacidoses) ([place]) 11/22/2018  . DKA (diabetic ketoacidosis) ([place]) 11/20/2018  . Sleep apnea 11/20/2018  . GERD (gastroesophageal reflux disease) 11/20/2018  . Polycythemia 11/20/2018  . Hyperbilirubinemia 11/20/2018    . Cholelithiasis 11/20/2018  . S/P nasal septoplasty 08/18/2018  . Fracture dislocation of right shoulder joint 12/04/2015  . Dislocation, shoulder closed   . Greater tuberosity of humerus fracture   . Morbid obesity due to excess calories ([place]) 10/26/2015  . Uncontrolled type 2 diabetes mellitus without complication, without [place]-term current use of insulin 07/16/2015  . Essential hypertension, benign 07/16/2015  . Personal history of noncompliance with medical treatment, presenting hazards to health 07/16/2015    Past Surgical History:  Procedure Laterality Date  . CHOLECYSTECTOMY N/A 11/24/2018   Procedure: LAPAROSCOPIC CHOLECYSTECTOMY;  Surgeon: Aviva Signs, MD;  Location: [facility];  Service: General;  Laterality: N/A;  . CIRCUMCISION N/A 09/14/2017   Procedure: CIRCUMCISION;  Surgeon: Cleon Gustin, MD;  Location: [facility];  Service: Urology;  Laterality: N/A;  1 HR [id number] [id number] - He has a Education officer, museum that needs to be called if changes made Mateo [facility] @ [place] [facility]  . HERNIA REPAIR     scrotum  . MAXILLARY ANTROSTOMY Right 08/18/2018   Procedure: ENDOSCOPIC MAXILLARY RIGHT ANTROSTOMY WITH TISSUE REMOVAL;  Surgeon: Leta Baptist, MD;  Location: [place];  Service: ENT;  Laterality: Right;  . NASAL SEPTOPLASTY W/ TURBINOPLASTY Bilateral 08/18/2018   Procedure: NASAL SEPTOPLASTY WITH TURBINATE REDUCTION;  Surgeon: Leta Baptist, MD;  Location: [place];  Service: ENT;  Laterality: Bilateral;  . NASAL SEPTUM SURGERY  08/18/2018  . ORIF SHOULDER FRACTURE Right  02/28/2016   Procedure: OPEN REDUCTION INTERNAL FIXATION (ORIF) SHOULDER FRACTURE;  Surgeon: Carole Civil, MD;  Location: [facility];  Service: Orthopedics;  Laterality: Right;  . SHOULDER CLOSED REDUCTION Right 12/04/2015   Procedure: CLOSED REDUCTION SHOULDER;  Surgeon: Carole Civil, MD;  Location: [facility];  Service: Orthopedics;  Laterality: Right;  . TOOTH EXTRACTION  spring 2016   top left         Family History  Problem Relation Age of Onset  . Diabetes Mother   . Diabetes Father     Social History   Tobacco Use  . Smoking status: Former Smoker    Packs/day: 0.50    Years: 5.00    Pack years: 2.50    Types: E-cigarettes    Quit date: 02/24/2009    Years since quitting: 11.1  . Smokeless tobacco: Never Used  Vaping Use  . Vaping Use: Some days  Substance Use Topics  . Alcohol use: Not Currently    Comment: once year  . Drug use: No    Home Medications Prior to Admission medications   Medication Sig Start Date End Date Taking? Authorizing Provider  amLODipine (NORVASC) 10 MG tablet Take 1 tablet (10 mg total) by mouth daily. 11/25/18   Barton Dubois, MD  blood glucose meter kit and supplies Dispense based on patient and insurance preference. Use up to four times daily as directed. (FOR ICD-10 E10.9, E11.9). 11/24/18   Barton Dubois, MD  chlorthalidone (HYGROTON) 50 MG tablet Take 50 mg by mouth daily. 05/18/19   [provider]  citalopram (CELEXA) 20 MG tablet Take 20 mg by mouth daily.     [provider]  gemfibrozil (LOPID) 600 MG tablet Take 600 mg by mouth 2 (two) times daily. 05/25/19   [provider]  Insulin Glargine (LANTUS) 100 UNIT/ML Solostar Pen Inject 10 Units into the skin daily for 30 days. Patient taking differently: Inject 15 Units into the skin daily.  11/24/18 05/27/19  Barton Dubois, MD  Insulin Pen Needle (ABOUTTIME PEN NEEDLE) 31G X 8 MM MISC Use as needed to inject insulin daily as instructed 11/24/18   Barton Dubois, MD  lisinopril (PRINIVIL,ZESTRIL) 40 MG tablet Take 1 tablet (40 mg total) by mouth daily. 06/26/14   Milton Ferguson, MD  LORazepam (ATIVAN) 1 MG tablet Take 1 tablet (1 mg total) by mouth every 8 (eight) hours as needed for anxiety. 06/09/19   Evalee Jefferson, PA-C  metFORMIN (GLUCOPHAGE) 500 MG tablet Take 1,000 mg by mouth 2 (two) times daily. 05/18/19   [provider]  metoCLOPramide  (REGLAN) 10 MG tablet Take 1 tablet (10 mg total) by mouth every 6 (six) hours as needed for nausea. 04/18/20   Evalee Jefferson, PA-C  ondansetron (ZOFRAN ODT) 4 MG disintegrating tablet Take 1 tablet (4 mg total) by mouth every 8 (eight) hours as needed for nausea or vomiting. 06/09/19   Evalee Jefferson, PA-C  ondansetron (ZOFRAN) 4 MG tablet Take 1 tablet (4 mg total) by mouth every 6 (six) hours. 05/27/19   Lily Kocher, PA-C  pantoprazole (PROTONIX) 40 MG tablet Take 1 tablet (40 mg total) by mouth daily. 05/27/19   Lily Kocher, PA-C  polyethylene glycol (MIRALAX) 17 g packet Take 17 g by mouth daily. 04/18/20   Evalee Jefferson, PA-C  potassium chloride SA (KLOR-CON) 20 MEQ tablet Take 20 mEq by mouth daily.    [provider]  promethazine (PHENERGAN) 25 MG suppository Place 1 suppository (25 mg total) rectally every  6 (six) hours as needed for nausea or vomiting. 04/16/20   Albrizze, Verline Lema E, PA-C    Allergies    Methylphenidate derivatives  Review of Systems   Review of Systems  Constitutional: Negative for chills and fever.  HENT: Negative for congestion and sore throat.   Eyes: Negative.   Respiratory: Negative for cough, chest tightness and shortness of breath.   Cardiovascular: Positive for chest pain.  Gastrointestinal: Positive for nausea and vomiting. Negative for abdominal pain.  Genitourinary: Negative.   Musculoskeletal: Negative for arthralgias, joint swelling and neck pain.  Skin: Negative.  Negative for rash and wound.  Neurological: Positive for weakness. Negative for dizziness, light-headedness, numbness and headaches.  Psychiatric/Behavioral: Negative.     Physical Exam Updated Vital Signs BP (!) 185/133 (BP Location: Left Arm)   Pulse 94   Temp 98.5 F (36.9 C) (Oral)   Resp 18   Ht $R'5\' 11"'HN$  (1.803 m)   Wt (!) 137 kg   SpO2 97%   BMI 42.12 kg/m   Physical Exam Vitals and nursing note reviewed.  Constitutional:      General: He is in acute distress.      Appearance: He is well-developed. He is obese.     Comments: Actively dry heaving upon initial eval.  HENT:     Head: Normocephalic and atraumatic.     Mouth/Throat:     Pharynx: No oropharyngeal exudate or posterior oropharyngeal erythema.  Eyes:     Conjunctiva/sclera: Conjunctivae normal.  Cardiovascular:     Rate and Rhythm: Normal rate and regular rhythm.     Heart sounds: Normal heart sounds.  Pulmonary:     Effort: Pulmonary effort is normal.     Breath sounds: Normal breath sounds. No wheezing.  Abdominal:     General: Abdomen is protuberant. Bowel sounds are normal.     Palpations: Abdomen is soft.     Tenderness: There is no abdominal tenderness. There is no right CVA tenderness, left CVA tenderness, guarding or rebound.  Musculoskeletal:        General: Normal range of motion.     Cervical back: Normal range of motion.  Skin:    General: Skin is warm and dry.  Neurological:     Mental Status: He is alert.     ED Results / Procedures / Treatments   Labs (all labs ordered are listed, but only abnormal results are displayed) Labs Reviewed  CBC WITH DIFFERENTIAL/PLATELET - Abnormal; Notable for the following components:      Result Value   WBC 11.3 (*)    Hemoglobin 17.9 (*)    All other components within normal limits  COMPREHENSIVE METABOLIC PANEL - Abnormal; Notable for the following components:   Potassium 3.2 (*)    Chloride 92 (*)    Glucose, Bld 244 (*)    Total Protein 8.5 (*)    Total Bilirubin 2.4 (*)    Anion gap 19 (*)    All other components within normal limits  URINALYSIS, ROUTINE W REFLEX MICROSCOPIC - Abnormal; Notable for the following components:   Color, Urine AMBER (*)    APPearance HAZY (*)    Hgb urine dipstick LARGE (*)    Ketones, ur 80 (*)    Protein, ur 100 (*)    RBC / HPF >50 (*)    Bacteria, UA RARE (*)    All other components within normal limits  COMPREHENSIVE METABOLIC PANEL - Abnormal; Notable for the following  components:   Potassium  3.4 (*)    Glucose, Bld 165 (*)    Calcium 8.5 (*)    Total Bilirubin 1.9 (*)    All other components within normal limits  SARS CORONAVIRUS 2 BY RT PCR (HOSPITAL ORDER, [facility])  LIPASE, BLOOD  TROPONIN I (HIGH SENSITIVITY)    EKG None  Radiology DG Chest Portable 1 View  Result Date: 04/18/2020 CLINICAL DATA:  Chest pain EXAM: PORTABLE CHEST 1 VIEW COMPARISON:  May 28, 2019 FINDINGS: Lungs are clear. Heart size and pulmonary vascularity are normal. No adenopathy. No pneumothorax. No bone lesions. IMPRESSION: Lungs clear.  Cardiac silhouette normal. Electronically Signed   By: Lowella Grip III M.D.   On: 04/18/2020 12:31    Procedures Procedures (including critical care time)  Medications Ordered in ED Medications  metoCLOPramide (REGLAN) injection 10 mg (10 mg Intravenous Given 04/18/20 1205)  sodium chloride 0.9 % bolus 1,000 mL (0 mLs Intravenous Stopped 04/18/20 1814)  LORazepam (ATIVAN) tablet 1 mg (1 mg Oral Given 04/18/20 1435)  lisinopril (ZESTRIL) tablet 40 mg (40 mg Oral Given 04/18/20 1435)  amLODipine (NORVASC) tablet 10 mg (10 mg Oral Given 04/18/20 1434)  potassium chloride SA (KLOR-CON) CR tablet 20 mEq (20 mEq Oral Given 04/18/20 1434)  sodium chloride 0.9 % bolus 1,000 mL (0 mLs Intravenous Stopped 04/18/20 1813)    ED Course  I have reviewed the triage vital signs and the nursing notes.  Pertinent labs & imaging results that were available during my care of the patient were reviewed by me and considered in my medical decision making (see chart for details).    MDM Rules/Calculators/A&P                          6:44 PM New information obtained by pt's RN - his mother died  last month (COPD) and has difficulty adjusting to this change (he did not live with her). He has increased anxiety bur also states has had anxiety his whole life.  Denies SI/HI.  Has had therapy in the past which has never helped  so is not interested.  Has 2 aunts who are unreliable but semi supportive. Also has Education officer, museum.    Pt was given his missed PO bp meds, also given PO ativan.   Labs reviewed, initial potassium was 3.2, this was improved with oral supplementation to 3.4.  His glucose was elevated at 244, he did have an anion gap of 19 but a CO2 normal range at 24.  He was given IV fluids 2 L and then rechecked his c-Met and his anion gap had improved to 10.  Repeat glucose 165.  He also initially had an elevated bilirubin at 2.4, this improved to 1.9 after fluid treatment.  His troponin is normal, doubt ACS as atypical presentation, he denies chest pain during this ED stay.  Patient was given Reglan and had no further nausea or vomiting during this ED visit.  He had not had any of his home medications in the past 2 days secondary to vomiting.  Initially his blood pressure was very elevated.  He was given his missed doses of amlodipine and lisinopril and he had improvement in his blood pressure to 150/82.  Unfortunately prior to discharge he had a phone conversation with a family member and became very angry at them, his blood pressure increased to 185/133.  He had no chest pain or shortness of breath.  Discussed further medication to  improve his blood pressure, patient was just interested in leaving the department.  He was advised to take his blood pressure medications this evening.  He states he has been tried on multiple different blood pressure medications and they never worked for him.  He was advised to follow-up with his PCP for recheck of his blood pressure within 1 week.  He was prescribed Reglan, also reminded him of the Phenergan suppositories at his pharmacy if he develops vomiting and is unable to keep his Reglan tablets down.  Prior to discharge she was also concerned about problems with constipation stating he has not had a bowel movement in several days.  Also states that when he does he passes hard balls or  marbles.  Review of his CT scan from 2 days ago with no constipation noted on this study.  With his vomiting, doubt he would have become constipated since then.  He was however prescribed MiraLAX for as needed use.    Final Clinical Impression(s) / ED Diagnoses Final diagnoses:  Intractable vomiting with nausea, unspecified vomiting type  Hyperglycemia  Hypertension, unspecified type    Rx / DC Orders ED Discharge Orders         Ordered    metoCLOPramide (REGLAN) 10 MG tablet  Every 6 hours PRN        04/18/20 1804    polyethylene glycol (MIRALAX) 17 g packet  Daily        04/18/20 1804           Landis Martins 04/18/20 1844    Maudie Flakes, MD 04/21/20 0237    Evalee Jefferson, PA-C 05/16/20 1503    Maudie Flakes, MD 05/16/20 2246

## 2020-04-18 NOTE — ED Triage Notes (Signed)
Pt reports vomiting and  chest pain x 3 days.  Reports hot flashes and cold chills.  Reports constipation recently.  Says was here 2 days ago for same.

## 2020-04-19 DIAGNOSIS — M5489 Other dorsalgia: Secondary | ICD-10-CM | POA: Diagnosis not present

## 2020-04-19 DIAGNOSIS — R1111 Vomiting without nausea: Secondary | ICD-10-CM | POA: Diagnosis not present

## 2020-04-19 DIAGNOSIS — R52 Pain, unspecified: Secondary | ICD-10-CM | POA: Diagnosis not present

## 2020-04-20 ENCOUNTER — Emergency Department (HOSPITAL_COMMUNITY): Payer: Medicare Other

## 2020-04-20 ENCOUNTER — Encounter (HOSPITAL_COMMUNITY): Payer: Self-pay | Admitting: Emergency Medicine

## 2020-04-20 ENCOUNTER — Other Ambulatory Visit: Payer: Self-pay

## 2020-04-20 ENCOUNTER — Emergency Department (HOSPITAL_COMMUNITY)
Admission: EM | Admit: 2020-04-20 | Discharge: 2020-04-20 | Disposition: A | Discharge status: Home / Self Care | Payer: Medicare Other | Attending: Emergency Medicine | Admitting: Emergency Medicine

## 2020-04-20 DIAGNOSIS — N201 Calculus of ureter: Secondary | ICD-10-CM

## 2020-04-20 DIAGNOSIS — Z87891 Personal history of nicotine dependence: Secondary | ICD-10-CM | POA: Insufficient documentation

## 2020-04-20 DIAGNOSIS — M545 Low back pain: Secondary | ICD-10-CM | POA: Diagnosis not present

## 2020-04-20 DIAGNOSIS — M5489 Other dorsalgia: Secondary | ICD-10-CM | POA: Diagnosis not present

## 2020-04-20 DIAGNOSIS — N132 Hydronephrosis with renal and ureteral calculous obstruction: Secondary | ICD-10-CM | POA: Diagnosis not present

## 2020-04-20 DIAGNOSIS — Z79899 Other long term (current) drug therapy: Secondary | ICD-10-CM | POA: Insufficient documentation

## 2020-04-20 DIAGNOSIS — R000 Tachycardia, unspecified: Secondary | ICD-10-CM | POA: Diagnosis not present

## 2020-04-20 DIAGNOSIS — Z7984 Long term (current) use of oral hypoglycemic drugs: Secondary | ICD-10-CM | POA: Diagnosis not present

## 2020-04-20 DIAGNOSIS — E1110 Type 2 diabetes mellitus with ketoacidosis without coma: Secondary | ICD-10-CM | POA: Diagnosis not present

## 2020-04-20 DIAGNOSIS — R109 Unspecified abdominal pain: Secondary | ICD-10-CM | POA: Diagnosis present

## 2020-04-20 DIAGNOSIS — I10 Essential (primary) hypertension: Secondary | ICD-10-CM | POA: Insufficient documentation

## 2020-04-20 DIAGNOSIS — R52 Pain, unspecified: Secondary | ICD-10-CM | POA: Diagnosis not present

## 2020-04-20 LAB — URINALYSIS, ROUTINE W REFLEX MICROSCOPIC
Bacteria, UA: NONE SEEN
Bilirubin Urine: NEGATIVE
Glucose, UA: NEGATIVE mg/dL
Ketones, ur: 20 mg/dL — AB
Leukocytes,Ua: NEGATIVE
Nitrite: NEGATIVE
Protein, ur: NEGATIVE mg/dL
RBC / HPF: >50 RBC/hpf — ABNORMAL HIGH (ref 0–5)
Specific Gravity, Urine: 1.019 (ref 1.005–1.030)
pH: 6 (ref 5.0–8.0)

## 2020-04-20 LAB — CBC WITH DIFFERENTIAL/PLATELET
Abs Immature Granulocytes: 0.04 10*3/uL (ref 0.00–0.07)
Basophils Absolute: 0.1 10*3/uL (ref 0.0–0.1)
Basophils Relative: 0 %
Eosinophils Absolute: 0 10*3/uL (ref 0.0–0.5)
Eosinophils Relative: 0 %
HCT: 50.5 % (ref 39.0–52.0)
Hemoglobin: 17.5 g/dL — ABNORMAL HIGH (ref 13.0–17.0)
Immature Granulocytes: 0 %
Lymphocytes Relative: 25 %
Lymphs Abs: 3.6 10*3/uL (ref 0.7–4.0)
MCH: 31 pg (ref 26.0–34.0)
MCHC: 34.7 g/dL (ref 30.0–36.0)
MCV: 89.4 fL (ref 80.0–100.0)
Monocytes Absolute: 1.4 10*3/uL — ABNORMAL HIGH (ref 0.1–1.0)
Monocytes Relative: 10 %
Neutro Abs: 9.1 10*3/uL — ABNORMAL HIGH (ref 1.7–7.7)
Neutrophils Relative %: 65 %
Platelets: 286 10*3/uL (ref 150–400)
RBC: 5.65 MIL/uL (ref 4.22–5.81)
RDW: 12.3 % (ref 11.5–15.5)
WBC: 14.2 10*3/uL — ABNORMAL HIGH (ref 4.0–10.5)
nRBC: 0 % (ref 0.0–0.2)

## 2020-04-20 LAB — COMPREHENSIVE METABOLIC PANEL
ALT: 24 U/L (ref 0–44)
AST: 20 U/L (ref 15–41)
Albumin: 4.6 g/dL (ref 3.5–5.0)
Alkaline Phosphatase: 66 U/L (ref 38–126)
Anion gap: 13 (ref 5–15)
BUN: 16 mg/dL (ref 6–20)
CO2: 24 mmol/L (ref 22–32)
Calcium: 9.6 mg/dL (ref 8.9–10.3)
Chloride: 97 mmol/L — ABNORMAL LOW (ref 98–111)
Creatinine, Ser: 1.23 mg/dL (ref 0.61–1.24)
GFR calc Af Amer: >60 mL/min (ref >60)
GFR calc non Af Amer: >60 mL/min (ref >60)
Glucose, Bld: 198 mg/dL — ABNORMAL HIGH (ref 70–99)
Potassium: 3.4 mmol/L — ABNORMAL LOW (ref 3.5–5.1)
Sodium: 134 mmol/L — ABNORMAL LOW (ref 135–145)
Total Bilirubin: 2.8 mg/dL — ABNORMAL HIGH (ref 0.3–1.2)
Total Protein: 8 g/dL (ref 6.5–8.1)

## 2020-04-20 LAB — CBG MONITORING, ED: Glucose-Capillary: 199 mg/dL — ABNORMAL HIGH (ref 70–99)

## 2020-04-20 LAB — LIPASE, BLOOD: Lipase: 144 U/L — ABNORMAL HIGH (ref 11–51)

## 2020-04-20 MED ORDER — DICYCLOMINE HCL 10 MG/ML IM SOLN
20.0000 mg | Freq: Once | INTRAMUSCULAR | Status: AC
  Administered 2020-04-20: 20 mg via INTRAMUSCULAR
  Filled 2020-04-20: qty 2

## 2020-04-20 MED ORDER — HYDROMORPHONE HCL 1 MG/ML IJ SOLN
1.0000 mg | Freq: Once | INTRAMUSCULAR | Status: AC
  Administered 2020-04-20: 1 mg via INTRAVENOUS
  Filled 2020-04-20: qty 1

## 2020-04-20 MED ORDER — HALOPERIDOL LACTATE 5 MG/ML IJ SOLN
2.0000 mg | Freq: Once | INTRAMUSCULAR | Status: AC
  Administered 2020-04-20: 2 mg via INTRAVENOUS
  Filled 2020-04-20: qty 1

## 2020-04-20 MED ORDER — KETOROLAC TROMETHAMINE 30 MG/ML IJ SOLN
30.0000 mg | Freq: Once | INTRAMUSCULAR | Status: AC
  Administered 2020-04-20: 30 mg via INTRAVENOUS
  Filled 2020-04-20: qty 1

## 2020-04-20 MED ORDER — ONDANSETRON HCL 4 MG PO TABS: 4.0000 mg | ORAL_TABLET | Freq: Three times a day (TID) | ORAL | 0 refills | Status: DC | PRN

## 2020-04-20 MED ORDER — OXYCODONE-ACETAMINOPHEN 5-325 MG PO TABS: 1.0000 | ORAL_TABLET | Freq: Four times a day (QID) | ORAL | 0 refills | Status: DC | PRN

## 2020-04-20 MED ORDER — SODIUM CHLORIDE 0.9 % IV BOLUS
1000.0000 mL | Freq: Once | INTRAVENOUS | Status: AC
  Administered 2020-04-20: 1000 mL via INTRAVENOUS

## 2020-04-20 MED ORDER — LORAZEPAM 2 MG/ML IJ SOLN
1.0000 mg | Freq: Once | INTRAMUSCULAR | Status: AC
  Administered 2020-04-20: 1 mg via INTRAVENOUS
  Filled 2020-04-20: qty 1

## 2020-04-20 MED ORDER — TAMSULOSIN HCL 0.4 MG PO CAPS: ORAL_CAPSULE | ORAL | 0 refills | Status: DC

## 2020-04-20 MED ORDER — METOCLOPRAMIDE HCL 5 MG/ML IJ SOLN
10.0000 mg | Freq: Once | INTRAMUSCULAR | Status: AC
  Administered 2020-04-20: 10 mg via INTRAVENOUS
  Filled 2020-04-20: qty 2

## 2020-04-20 NOTE — ED Notes (Signed)
Assumed care of pt at 0700 

## 2020-04-20 NOTE — ED Triage Notes (Signed)
Patient complains of left lower right sided back pain. Patient was seen here on the 22 nd for the same complaint and states that nothing was found." Patient states that he feels like his organs are being crushed "ripped out."

## 2020-04-20 NOTE — ED Notes (Signed)
Patient transported to CT 

## 2020-04-20 NOTE — Discharge Instructions (Signed)
Drink plenty of fluids. Take the medications as prescribed. Return to the ED if you get fever or have uncontrolled vomiting or pain.

## 2020-04-20 NOTE — ED Provider Notes (Signed)
[facility] EMERGENCY DEPARTMENT Provider Note   CSN: [id number] Arrival date & time: 04/20/20  0251   Time seen 3:45 AM History Chief Complaint  Patient presents with  . Back Pain    lower right flank    Kyle Collins is a 34 y.o. male.  HPI   Patient states his pain started 4 days ago and was intermittent now it has been constant for 6 hours.  It is "all of those" sharp, dull, achy.  He states the pain is in a different place every minute.  It starts in his right back and radiates around into his abdomen all the way around to the left side but seems worse on the right.  He has had nausea but no vomiting but states he has no appetite.  He states he "force liquid out of my rectum" this evening.  He states his last normal bowel movement was 4 to 5 days ago.  This is his third ED visit since September 20.  He was seen on September 20 for abdominal pain, he was seen again on September 22 for vomiting and states the pain was not as bad that day.  He states the pain is something he is never had before although it does remind him when he had gallbladder problems however he had his gallbladder removed about a year ago.  PCP Neale Burly, MD   Past Medical History:  Diagnosis Date  . ADHD (attention deficit hyperactivity disorder)   . Anxiety   . Bipolar disorder ([place])   . Depression   . Diabetes mellitus   . GERD (gastroesophageal reflux disease)   . Headache   . Hemochromatosis 2019  . Hypertension   . Polycythemia   . Sleep apnea   . Thyroid disease     Patient Active Problem List   Diagnosis Date Noted  . Abdominal pain   . Gastritis 11/22/2018  . DKA (diabetic ketoacidoses) ([place]) 11/22/2018  . DKA (diabetic ketoacidosis) ([place]) 11/20/2018  . Sleep apnea 11/20/2018  . GERD (gastroesophageal reflux disease) 11/20/2018  . Polycythemia 11/20/2018  . Hyperbilirubinemia 11/20/2018  . Cholelithiasis 11/20/2018  . S/P nasal septoplasty 08/18/2018  . Fracture dislocation of  right shoulder joint 12/04/2015  . Dislocation, shoulder closed   . Greater tuberosity of humerus fracture   . Morbid obesity due to excess calories ([place]) 10/26/2015  . Uncontrolled type 2 diabetes mellitus without complication, without Agne-term current use of insulin 07/16/2015  . Essential hypertension, benign 07/16/2015  . Personal history of noncompliance with medical treatment, presenting hazards to health 07/16/2015    Past Surgical History:  Procedure Laterality Date  . CHOLECYSTECTOMY N/A 11/24/2018   Procedure: LAPAROSCOPIC CHOLECYSTECTOMY;  Surgeon: Aviva Signs, MD;  Location: [facility];  Service: General;  Laterality: N/A;  . CIRCUMCISION N/A 09/14/2017   Procedure: CIRCUMCISION;  Surgeon: Cleon Gustin, MD;  Location: [facility];  Service: Urology;  Laterality: N/A;  1 HR 336 [phone] - He has a Education officer, museum that needs to be called if changes made Mateo Flow @ [place] [facility]  . HERNIA REPAIR     scrotum  . MAXILLARY ANTROSTOMY Right 08/18/2018   Procedure: ENDOSCOPIC MAXILLARY RIGHT ANTROSTOMY WITH TISSUE REMOVAL;  Surgeon: Leta Baptist, MD;  Location: [place];  Service: ENT;  Laterality: Right;  . NASAL SEPTOPLASTY W/ TURBINOPLASTY Bilateral 08/18/2018   Procedure: NASAL SEPTOPLASTY WITH TURBINATE REDUCTION;  Surgeon: Leta Baptist, MD;  Location: [place];  Service: ENT;  Laterality: Bilateral;  .  NASAL SEPTUM SURGERY  08/18/2018  . ORIF SHOULDER FRACTURE Right 02/28/2016   Procedure: OPEN REDUCTION INTERNAL FIXATION (ORIF) SHOULDER FRACTURE;  Surgeon: Carole Civil, MD;  Location: [facility];  Service: Orthopedics;  Laterality: Right;  . SHOULDER CLOSED REDUCTION Right 12/04/2015   Procedure: CLOSED REDUCTION SHOULDER;  Surgeon: Carole Civil, MD;  Location: [facility];  Service: Orthopedics;  Laterality: Right;  . TOOTH EXTRACTION  spring 2016   top left        Family History  Problem Relation Age of Onset  . Diabetes Mother   . Diabetes Father       Social History   Tobacco Use  . Smoking status: Former Smoker    Packs/day: 0.50    Years: 5.00    Pack years: 2.50    Types: E-cigarettes    Quit date: 02/24/2009    Years since quitting: 11.1  . Smokeless tobacco: Never Used  Vaping Use  . Vaping Use: Some days  Substance Use Topics  . Alcohol use: Not Currently    Comment: once year  . Drug use: No    Home Medications Prior to Admission medications   Medication Sig Start Date End Date Taking? Authorizing Provider  amLODipine (NORVASC) 10 MG tablet Take 1 tablet (10 mg total) by mouth daily. 11/25/18   Barton Dubois, MD  blood glucose meter kit and supplies Dispense based on patient and insurance preference. Use up to four times daily as directed. (FOR ICD-10 E10.9, E11.9). 11/24/18   Barton Dubois, MD  chlorthalidone (HYGROTON) 50 MG tablet Take 50 mg by mouth daily. 05/18/19   [provider]  citalopram (CELEXA) 20 MG tablet Take 20 mg by mouth daily.     [provider]  gemfibrozil (LOPID) 600 MG tablet Take 600 mg by mouth 2 (two) times daily. 05/25/19   [provider]  Insulin Glargine (LANTUS) 100 UNIT/ML Solostar Pen Inject 10 Units into the skin daily for 30 days. Patient taking differently: Inject 15 Units into the skin daily.  11/24/18 05/27/19  Barton Dubois, MD  Insulin Pen Needle (ABOUTTIME PEN NEEDLE) 31G X 8 MM MISC Use as needed to inject insulin daily as instructed 11/24/18   Barton Dubois, MD  lisinopril (PRINIVIL,ZESTRIL) 40 MG tablet Take 1 tablet (40 mg total) by mouth daily. 06/26/14   Milton Ferguson, MD  LORazepam (ATIVAN) 1 MG tablet Take 1 tablet (1 mg total) by mouth every 8 (eight) hours as needed for anxiety. 06/09/19   Evalee Jefferson, PA-C  metFORMIN (GLUCOPHAGE) 500 MG tablet Take 1,000 mg by mouth 2 (two) times daily. 05/18/19   [provider]  metoCLOPramide (REGLAN) 10 MG tablet Take 1 tablet (10 mg total) by mouth every 6 (six) hours as needed for nausea.  04/18/20   Evalee Jefferson, PA-C  ondansetron (ZOFRAN ODT) 4 MG disintegrating tablet Take 1 tablet (4 mg total) by mouth every 8 (eight) hours as needed for nausea or vomiting. 06/09/19   Evalee Jefferson, PA-C  ondansetron (ZOFRAN) 4 MG tablet Take 1 tablet (4 mg total) by mouth every 8 (eight) hours as needed. 04/20/20   Rolland Porter, MD  oxyCODONE-acetaminophen (PERCOCET/ROXICET) 5-325 MG tablet Take 1 tablet by mouth every 6 (six) hours as needed for severe pain. 04/20/20   Rolland Porter, MD  pantoprazole (PROTONIX) 40 MG tablet Take 1 tablet (40 mg total) by mouth daily. 05/27/19   Lily Kocher, PA-C  polyethylene glycol (MIRALAX) 17 g packet Take 17 g by mouth daily.  04/18/20   Evalee Jefferson, PA-C  potassium chloride SA (KLOR-CON) 20 MEQ tablet Take 20 mEq by mouth daily.    [provider]  promethazine (PHENERGAN) 25 MG suppository Place 1 suppository (25 mg total) rectally every 6 (six) hours as needed for nausea or vomiting. 04/16/20   Albrizze, Verline Lema E, PA-C  tamsulosin (FLOMAX) 0.4 MG CAPS capsule Take 1 po QD until you pass the stone. 04/20/20   Rolland Porter, MD    Allergies    Methylphenidate derivatives  Review of Systems   Review of Systems  All other systems reviewed and are negative.   Physical Exam Updated Vital Signs BP (!) 148/90   Pulse 88   Temp (!) 97.1 F (36.2 C) (Tympanic)   Resp 16   Ht $R'5\' 11"'wG$  (1.803 m)   Wt (!) 138.8 kg   SpO2 98%   BMI 42.68 kg/m   Physical Exam Vitals and nursing note reviewed.  Constitutional:      General: He is in acute distress.     Appearance: He is obese.  HENT:     Head: Normocephalic and atraumatic.     Right Ear: External ear normal.     Left Ear: External ear normal.     Mouth/Throat:     Mouth: Mucous membranes are dry.  Eyes:     Extraocular Movements: Extraocular movements intact.     Conjunctiva/sclera: Conjunctivae normal.     Pupils: Pupils are equal, round, and reactive to light.  Cardiovascular:     Rate and  Rhythm: Normal rate and regular rhythm.     Pulses: Normal pulses.     Heart sounds: Normal heart sounds.  Pulmonary:     Effort: Respiratory distress present.     Breath sounds: Normal breath sounds.  Abdominal:     General: There is no distension.     Tenderness: There is no abdominal tenderness.     Comments: He has no tenderness to palpation in his abdomen but he states when I palpate his upper abdomen it makes it hurt in his right flank.  Musculoskeletal:        General: Normal range of motion.     Cervical back: Normal range of motion.  Skin:    General: Skin is warm and dry.     Findings: No erythema or rash.  Neurological:     General: No focal deficit present.     Mental Status: He is oriented to person, place, and time.     Cranial Nerves: No cranial nerve deficit.  Psychiatric:        Mood and Affect: Mood is anxious. Affect is labile.        Speech: Speech is rapid and pressured.        Behavior: Behavior is agitated.     Comments: Patient can be heard down the hall saying "somebody better get in here now and give me pain medicine".  Patient is constantly saying things under his breath.     ED Results / Procedures / Treatments   Labs (all labs ordered are listed, but only abnormal results are displayed) Results for orders placed or performed during the hospital encounter of 04/20/20  Urinalysis, Routine w reflex microscopic Urine, Clean Catch  Result Value Ref Range   Color, Urine YELLOW YELLOW   APPearance HAZY (A) CLEAR   Specific Gravity, Urine 1.019 1.005 - 1.030   pH 6.0 5.0 - 8.0   Glucose, UA NEGATIVE NEGATIVE mg/dL   Hgb  urine dipstick LARGE (A) NEGATIVE   Bilirubin Urine NEGATIVE NEGATIVE   Ketones, ur 20 (A) NEGATIVE mg/dL   Protein, ur NEGATIVE NEGATIVE mg/dL   Nitrite NEGATIVE NEGATIVE   Leukocytes,Ua NEGATIVE NEGATIVE   RBC / HPF >50 (H) 0 - 5 RBC/hpf   WBC, UA 0-5 0 - 5 WBC/hpf   Bacteria, UA NONE SEEN NONE SEEN   Squamous Epithelial / LPF 0-5  0 - 5   Mucus PRESENT    Hyaline Casts, UA PRESENT   Comprehensive metabolic panel  Result Value Ref Range   Sodium 134 (L) 135 - 145 mmol/L   Potassium 3.4 (L) 3.5 - 5.1 mmol/L   Chloride 97 (L) 98 - 111 mmol/L   CO2 24 22 - 32 mmol/L   Glucose, Bld 198 (H) 70 - 99 mg/dL   BUN 16 6 - 20 mg/dL   Creatinine, Ser 1.23 0.61 - 1.24 mg/dL   Calcium 9.6 8.9 - 10.3 mg/dL   Total Protein 8.0 6.5 - 8.1 g/dL   Albumin 4.6 3.5 - 5.0 g/dL   AST 20 15 - 41 U/L   ALT 24 0 - 44 U/L   Alkaline Phosphatase 66 38 - 126 U/L   Total Bilirubin 2.8 (H) 0.3 - 1.2 mg/dL   GFR calc non Af Amer >60 >60 mL/min   GFR calc Af Amer >60 >60 mL/min   Anion gap 13 5 - 15  Lipase, blood  Result Value Ref Range   Lipase 144 (H) 11 - 51 U/L  CBC with Differential  Result Value Ref Range   WBC 14.2 (H) 4.0 - 10.5 K/uL   RBC 5.65 4.22 - 5.81 MIL/uL   Hemoglobin 17.5 (H) 13.0 - 17.0 g/dL   HCT 50.5 39 - 52 %   MCV 89.4 80.0 - 100.0 fL   MCH 31.0 26.0 - 34.0 pg   MCHC 34.7 30.0 - 36.0 g/dL   RDW 12.3 11.5 - 15.5 %   Platelets 286 150 - 400 K/uL   nRBC 0.0 0.0 - 0.2 %   Neutrophils Relative % 65 %   Neutro Abs 9.1 (H) 1.7 - 7.7 K/uL   Lymphocytes Relative 25 %   Lymphs Abs 3.6 0.7 - 4.0 K/uL   Monocytes Relative 10 %   Monocytes Absolute 1.4 (H) 0 - 1 K/uL   Eosinophils Relative 0 %   Eosinophils Absolute 0.0 0 - 0 K/uL   Basophils Relative 0 %   Basophils Absolute 0.1 0 - 0 K/uL   Immature Granulocytes 0 %   Abs Immature Granulocytes 0.04 0.00 - 0.07 K/uL  CBG monitoring, ED  Result Value Ref Range   Glucose-Capillary 199 (H) 70 - 99 mg/dL   Comment 1 Document in Chart    Comment 2 Call MD NNP PA CNM     Laboratory interpretation all normal except hematuria, hyperglycemia without metabolic acidosis, mild elevation of lipase but still below 3 times upper limits of normal     EKG None  Radiology DG Chest Portable 1 View  Result Date: 04/18/2020 CLINICAL DATA:  Chest pain  IMPRESSION: Lungs  clear.  Cardiac silhouette normal. Electronically Signed   By: Lowella Grip III M.D.   On: 04/18/2020 12:31   CT ABDOMEN PELVIS W CONTRAST  Result Date: 04/16/2020 CLINICAL DATA:  Right lower quadrant abdominal pain. IMPRESSION: 1. No acute abdominal/pelvic findings, mass lesions or adenopathy. 2. Status post cholecystectomy. No biliary dilatation. Electronically Signed   By: Ricky Stabs.D.  On: 04/16/2020 17:00   Procedures Procedures (including critical care time)  Medications Ordered in ED Medications  HYDROmorphone (DILAUDID) injection 1 mg (has no administration in time range)  dicyclomine (BENTYL) injection 20 mg (20 mg Intramuscular Given 04/20/20 0418)  ketorolac (TORADOL) 30 MG/ML injection 30 mg (30 mg Intravenous Given 04/20/20 0412)  metoCLOPramide (REGLAN) injection 10 mg (10 mg Intravenous Given 04/20/20 0411)  sodium chloride 0.9 % bolus 1,000 mL (1,000 mLs Intravenous New Bag/Given 04/20/20 0409)  haloperidol lactate (HALDOL) injection 2 mg (2 mg Intravenous Given 04/20/20 0417)  LORazepam (ATIVAN) injection 1 mg (1 mg Intravenous Given 04/20/20 0414)    ED Course  I have reviewed the triage vital signs and the nursing notes.  Pertinent labs & imaging results that were available during my care of the patient were reviewed by me and considered in my medical decision making (see chart for details).    MDM Rules/Calculators/A&P                         Patient is extremely dramatic, he was given multiple medication's for his pain.  Laboratory testing was done.  Recheck at 6:10 AM patient states his pain was totally gone but is starting to return.  We discussed his test results which did show a hematuria which he has not had before September 20.  He had a CT before with contrast which can obscure small ureteral or renal stones.  He was sent back to CT to see if he has a renal stone.  7:00 AM patient CT has resulted and shows he is having a ureteral stone that he is  trying to pass, currently it stuck at the level of L4.  I went back and talked to the patient about his test results.  He is much calmer now.  He was given Dilaudid for pain.  Once his pain is controlled he will be discharged home to follow-up with urology.  Final Clinical Impression(s) / ED Diagnoses Final diagnoses:  Right ureteral stone    Rx / DC Orders ED Discharge Orders         Ordered    tamsulosin (FLOMAX) 0.4 MG CAPS capsule        04/20/20 0712    oxyCODONE-acetaminophen (PERCOCET/ROXICET) 5-325 MG tablet  Every 6 hours PRN        04/20/20 0712    ondansetron (ZOFRAN) 4 MG tablet  Every 8 hours PRN        04/20/20 4128          Plan discharge  Rolland Porter, MD, Barbette Or, MD 04/20/20 [phone]

## 2020-05-08 DIAGNOSIS — I209 Angina pectoris, unspecified: Secondary | ICD-10-CM | POA: Diagnosis not present

## 2020-05-08 DIAGNOSIS — R0600 Dyspnea, unspecified: Secondary | ICD-10-CM | POA: Diagnosis not present

## 2020-05-08 DIAGNOSIS — E785 Hyperlipidemia, unspecified: Secondary | ICD-10-CM | POA: Diagnosis not present

## 2020-05-08 DIAGNOSIS — I10 Essential (primary) hypertension: Secondary | ICD-10-CM | POA: Diagnosis not present

## 2020-05-08 DIAGNOSIS — E1165 Type 2 diabetes mellitus with hyperglycemia: Secondary | ICD-10-CM | POA: Diagnosis not present

## 2020-05-08 DIAGNOSIS — Z794 Long term (current) use of insulin: Secondary | ICD-10-CM | POA: Diagnosis not present

## 2020-05-23 DIAGNOSIS — E1165 Type 2 diabetes mellitus with hyperglycemia: Secondary | ICD-10-CM | POA: Diagnosis not present

## 2020-05-23 DIAGNOSIS — R0689 Other abnormalities of breathing: Secondary | ICD-10-CM | POA: Diagnosis not present

## 2020-05-23 DIAGNOSIS — I10 Essential (primary) hypertension: Secondary | ICD-10-CM | POA: Diagnosis not present

## 2020-05-23 DIAGNOSIS — R1111 Vomiting without nausea: Secondary | ICD-10-CM | POA: Diagnosis not present

## 2020-05-29 ENCOUNTER — Ambulatory Visit: Payer: Medicare Other | Admitting: Urology

## 2020-05-29 NOTE — Progress Notes (Incomplete)
H&P  Chief Complaint: F/U of Gross Hematuria - Ureteral Stone   History of Present Illness:  11.2.2021:   Copied from related ER visit notes:  9.24.2021: Patient states his pain started 4 days ago and was intermittent now it has been constant for 6 hours.  It is "all of those" sharp, dull, achy.  He states the pain is in a different place every minute.  It starts in his right back and radiates around into his abdomen all the way around to the left side but seems worse on the right.  He has had nausea but no vomiting but states he has no appetite.  He states he "force liquid out of my rectum" this evening.  He states his last normal bowel movement was 4 to 5 days ago.  This is his third ED visit since September 20.  He was seen on September 20 for abdominal pain, he was seen again on September 22 for vomiting and states the pain was not as bad that day.  He states the pain is something he is never had before although it does remind him when he had gallbladder problems however he had his gallbladder removed about a year ago. - PCP Neale Burly, MD  Past Medical History:  Diagnosis Date  . ADHD (attention deficit hyperactivity disorder)   . Anxiety   . Bipolar disorder ([place])   . Depression   . Diabetes mellitus   . GERD (gastroesophageal reflux disease)   . Headache   . Hemochromatosis 2019  . Hypertension   . Polycythemia   . Sleep apnea   . Thyroid disease     Past Surgical History:  Procedure Laterality Date  . CHOLECYSTECTOMY N/A 11/24/2018   Procedure: LAPAROSCOPIC CHOLECYSTECTOMY;  Surgeon: Aviva Signs, MD;  Location: [facility];  Service: General;  Laterality: N/A;  . CIRCUMCISION N/A 09/14/2017   Procedure: CIRCUMCISION;  Surgeon: Cleon Gustin, MD;  Location: [facility];  Service: Urology;  Laterality: N/A;  1 HR 336 [phone] - He has a Education officer, museum that needs to be called if changes made Mateo Flow @ [place] [facility]  . HERNIA REPAIR      scrotum  . MAXILLARY ANTROSTOMY Right 08/18/2018   Procedure: ENDOSCOPIC MAXILLARY RIGHT ANTROSTOMY WITH TISSUE REMOVAL;  Surgeon: Leta Baptist, MD;  Location: [place];  Service: ENT;  Laterality: Right;  . NASAL SEPTOPLASTY W/ TURBINOPLASTY Bilateral 08/18/2018   Procedure: NASAL SEPTOPLASTY WITH TURBINATE REDUCTION;  Surgeon: Leta Baptist, MD;  Location: [place];  Service: ENT;  Laterality: Bilateral;  . NASAL SEPTUM SURGERY  08/18/2018  . ORIF SHOULDER FRACTURE Right 02/28/2016   Procedure: OPEN REDUCTION INTERNAL FIXATION (ORIF) SHOULDER FRACTURE;  Surgeon: Carole Civil, MD;  Location[facility];  Service: Orthopedics;  Laterality: Right;  . SHOULDER CLOSED REDUCTION Right 12/04/2015   Procedure: CLOSED REDUCTION SHOULDER;  Surgeon: Carole Civil, MD;  Location: [facility];  Service: Orthopedics;  Laterality: Right;  . TOOTH EXTRACTION  spring 2016   top left     Home Medications:  Allergies as of 05/29/2020      Reactions   Methylphenidate Derivatives Other (See Comments)   Patient states it made him "act like a zombie" when given as a child      Medication List       Accurate as of May 29, 2020 11:40 AM. If you have any questions, ask your nurse or doctor.        amLODipine 10 MG tablet Commonly  known as: NORVASC Take 1 tablet (10 mg total) by mouth daily.   blood glucose meter kit and supplies Dispense based on patient and insurance preference. Use up to four times daily as directed. (FOR ICD-10 E10.9, E11.9).   chlorthalidone 50 MG tablet Commonly known as: HYGROTON Take 50 mg by mouth daily.   citalopram 20 MG tablet Commonly known as: CELEXA Take 20 mg by mouth daily.   gemfibrozil 600 MG tablet Commonly known as: LOPID Take 600 mg by mouth 2 (two) times daily.   insulin glargine 100 UNIT/ML Solostar Pen Commonly known as: LANTUS Inject 10 Units into the skin daily for 30 days. What changed: how much to take   Insulin Pen Needle 31G X 8 MM Misc Commonly known as:  AboutTime Pen Needle Use as needed to inject insulin daily as instructed   lisinopril 40 MG tablet Commonly known as: ZESTRIL Take 1 tablet (40 mg total) by mouth daily.   LORazepam 1 MG tablet Commonly known as: Ativan Take 1 tablet (1 mg total) by mouth every 8 (eight) hours as needed for anxiety.   metFORMIN 500 MG tablet Commonly known as: GLUCOPHAGE Take 1,000 mg by mouth 2 (two) times daily.   metoCLOPramide 10 MG tablet Commonly known as: REGLAN Take 1 tablet (10 mg total) by mouth every 6 (six) hours as needed for nausea.   ondansetron 4 MG disintegrating tablet Commonly known as: Zofran ODT Take 1 tablet (4 mg total) by mouth every 8 (eight) hours as needed for nausea or vomiting.   ondansetron 4 MG tablet Commonly known as: ZOFRAN Take 1 tablet (4 mg total) by mouth every 8 (eight) hours as needed.   oxyCODONE-acetaminophen 5-325 MG tablet Commonly known as: PERCOCET/ROXICET Take 1 tablet by mouth every 6 (six) hours as needed for severe pain.   pantoprazole 40 MG tablet Commonly known as: PROTONIX Take 1 tablet (40 mg total) by mouth daily.   polyethylene glycol 17 g packet Commonly known as: MiraLax Take 17 g by mouth daily.   potassium chloride SA 20 MEQ tablet Commonly known as: KLOR-CON Take 20 mEq by mouth daily.   promethazine 25 MG suppository Commonly known as: PHENERGAN Place 1 suppository (25 mg total) rectally every 6 (six) hours as needed for nausea or vomiting.   tamsulosin 0.4 MG Caps capsule Commonly known as: Flomax Take 1 po QD until you pass the stone.       Allergies:  Allergies  Allergen Reactions  . Methylphenidate Derivatives Other (See Comments)    Patient states it made him "act like a zombie" when given as a child    Family History  Problem Relation Age of Onset  . Diabetes Mother   . Diabetes Father     Social History:  reports that he quit smoking about 11 years ago. His smoking use included e-cigarettes. He has  a 2.50 pack-year smoking history. He has never used smokeless tobacco. He reports previous alcohol use. He reports that he does not use drugs.  ROS: A complete review of systems was performed.  All systems are negative except for pertinent findings as noted.  Physical Exam:  Vital signs in last 24 hours: There were no vitals taken for this visit. Constitutional:  Alert and oriented, No acute distress Cardiovascular: Regular rate  Respiratory: Normal respiratory effort GI: Abdomen is soft, nontender, nondistended, no abdominal masses. No CVAT.  Genitourinary: Normal male phallus, testes are descended bilaterally and non-tender and without masses, scrotum is normal in  appearance without lesions or masses, perineum is normal on inspection. Lymphatic: No lymphadenopathy Neurologic: Grossly intact, no focal deficits Psychiatric: Normal mood and affect  Laboratory Data:  No results for input(s): WBC, HGB, HCT, PLT in the last 72 hours.  No results for input(s): NA, K, CL, GLUCOSE, BUN, CALCIUM, CREATININE in the last 72 hours.  Invalid input(s): CO3   No results found for this or any previous visit (from the past 24 hour(s)). No results found for this or any previous visit (from the past 240 hour(s)).  Renal Function: No results for input(s): CREATININE in the last 168 hours. CrCl cannot be calculated (Patient's most recent lab result is older than the maximum 21 days allowed.).  Radiologic Imaging: No results found.  Impression/Assessment:  ***  Plan:  ***

## 2020-05-31 DIAGNOSIS — I209 Angina pectoris, unspecified: Secondary | ICD-10-CM | POA: Diagnosis not present

## 2020-05-31 DIAGNOSIS — R0600 Dyspnea, unspecified: Secondary | ICD-10-CM | POA: Diagnosis not present

## 2020-05-31 DIAGNOSIS — R079 Chest pain, unspecified: Secondary | ICD-10-CM | POA: Diagnosis not present

## 2020-06-12 ENCOUNTER — Ambulatory Visit (INDEPENDENT_AMBULATORY_CARE_PROVIDER_SITE_OTHER): Payer: Medicare Other | Admitting: Urology

## 2020-06-12 ENCOUNTER — Other Ambulatory Visit: Payer: Self-pay

## 2020-06-12 ENCOUNTER — Encounter: Payer: Self-pay | Admitting: Urology

## 2020-06-12 VITALS — BP 183/136 | HR 101 | Temp 98.8°F | Ht 71.0 in | Wt 306.0 lb

## 2020-06-12 DIAGNOSIS — R319 Hematuria, unspecified: Secondary | ICD-10-CM | POA: Diagnosis not present

## 2020-06-12 LAB — URINALYSIS, ROUTINE W REFLEX MICROSCOPIC
Bilirubin, UA: NEGATIVE
Glucose, UA: NEGATIVE
Leukocytes,UA: NEGATIVE
Nitrite, UA: NEGATIVE
Specific Gravity, UA: >1.03 — ABNORMAL HIGH (ref 1.005–1.030)
Urobilinogen, Ur: 1 mg/dL (ref 0.2–1.0)
pH, UA: 5.5 (ref 5.0–7.5)

## 2020-06-12 LAB — MICROSCOPIC EXAMINATION
Bacteria, UA: NONE SEEN
Epithelial Cells (non renal): NONE SEEN /hpf (ref 0–10)
Renal Epithel, UA: NONE SEEN /hpf
WBC, UA: NONE SEEN /hpf (ref 0–5)

## 2020-06-12 NOTE — Progress Notes (Signed)
Urological Symptom Review  Patient is experiencing the following symptoms: Get up at night to urinate   Review of Systems  Gastrointestinal (upper)  : Nausea Vomiting Indigestion/heartburn  Gastrointestinal (lower) : Diarrhea  Constitutional : Night Sweats Fatigue  Skin: Negative for skin symptoms  Eyes: Negative for eye symptoms  Ear/Nose/Throat : Sinus problems  Hematologic/Lymphatic: Negative for Hematologic/Lymphatic symptoms  Cardiovascular : Chest pain  Respiratory : Negative for respiratory symptoms  Endocrine: Excessive thirst  Musculoskeletal: Back pain Joint pain  Neurological: Headaches  Psychologic: Depression Anxiety

## 2020-06-12 NOTE — Progress Notes (Signed)
H&P  Chief Complaint: ED, Kidney stones, H/O phimosis  History of Present Illness:  11.16.2021: Pt seen on 9.24.2021 for kidney stone but is uncertain whether he has passed his stone. He still experiences stiffness and pain with his right flank. He describes the sensations associated with his kidney stone as painful pressure, urgency, and nausea. He denies any significant kidney stone related symptoms at this time.  Pt notes that he has regained the ability to retract the skin/adipose tissue surrounding his penis and he reports no adverse symptoms at this time.  IPSS Questionnaire (AUA-7): Over the past month.   1)  How often have you had a sensation of not emptying your bladder completely after you finish urinating?  0 - Not at all  2)  How often have you had to urinate again less than two hours after you finished urinating? 4 - More than half the time  3)  How often have you found you stopped and started again several times when you urinated?  0 - Not at all  4) How difficult have you found it to postpone urination?  0 - Not at all  5) How often have you had a weak urinary stream?  0 - Not at all  6) How often have you had to push or strain to begin urination?  0 - Not at all  7) How many times did you most typically get up to urinate from the time you went to bed until the time you got up in the morning?  4 - 4 times  Total score:  0-7 mildly symptomatic   8-19 moderately symptomatic   20-35 severely symptomatic  QoL score: 3   (below copied from [facility] records):  CC: I have trouble rolling my foreskin back.  HPI: Kyle Collins is a 34 year-old male established patient who is here for trouble with rollling back his foreskin.  He has had difficulties with rolling his foreskin back for 3 months. He was last able to easily roll his foreskin back approximately 10/26/2016. His symptoms have gotten worse over the last year.   He does have dysuria. He has had inflammation of the head of his  penis.   He does not have to strain or bear down to start his urinary stream. He does not have a good size and strength to his urinary stream. His urinary stream does not start and stop during voiding.   He would like to be circumcised.   7.11.2018: For the past 3 months he noted worsening difficulty retracting his foreskin. He develops cracks int he foresking attempting to retract it. He has DMII with his last A1c of 8.4. He is currently on invokana  He has severe pain with erections   8.8.2018: He has been using the cream and he notes an improvement in the cracking and ability to retract the foreskin while his penis is flaccid. He continue to have difficulty retracting the foreskin for an erection and he continues to have pain.   2.1.2019: He notes worsening phimosis and cracking of his foreskin. He is having a weaker stream due the phimosis.   3.27.2019: s/p circumcision which showed lichen sclerosis. He continues to have pain with erections   5.8.2019: He notes improvement in the pain and tightness of the skin since using the cream   7.17.2019: Pt has improvement in his pain with erections but he has tethering of the left side of his penile shaft.   10.30.2019: No pain with erections. no  tethering of his glans.   8.11.2020: Here today for follow-up after circumcision procedure. He is not having any pain with erections or in his penis. He does note some testicular cramping but tolerates this well and does not feel as though this needs to be monitored. He reports that he is very satisfied with his condition post operation. He is also very satisfied with the quality of his urination.   IIEF-5 Score: The patient's confidence that he can get an erection is moderate. The patient's erections were hard enough for penetration most times. The patient was able to maintain his erection after he had penetrated his partner sometimes. During sexual intercouse, it was slightly difficult to maintain his  erection to the completion of intercourse. The patient found sexual intercourse satisfactory sometimes.   Calculated IIEF-5 Symptom Score: 17   Past Medical History:  Diagnosis Date  . ADHD (attention deficit hyperactivity disorder)   . Anxiety   . Bipolar disorder ([place])   . Depression   . Diabetes mellitus   . GERD (gastroesophageal reflux disease)   . Headache   . Hemochromatosis 2019  . Hypertension   . Polycythemia   . Sleep apnea   . Thyroid disease     Past Surgical History:  Procedure Laterality Date  . CHOLECYSTECTOMY N/A 11/24/2018   Procedure: LAPAROSCOPIC CHOLECYSTECTOMY;  Surgeon: Aviva Signs, MD;  Location: [facility];  Service: General;  Laterality: N/A;  . CIRCUMCISION N/A 09/14/2017   Procedure: CIRCUMCISION;  Surgeon: Cleon Gustin, MD;  Location: [facility];  Service: Urology;  Laterality: N/A;  1 HR 336 [phone] - He has a Education officer, museum that needs to be called if changes made Mateo Flow @ [place] [facility]  . HERNIA REPAIR     scrotum  . MAXILLARY ANTROSTOMY Right 08/18/2018   Procedure: ENDOSCOPIC MAXILLARY RIGHT ANTROSTOMY WITH TISSUE REMOVAL;  Surgeon: Leta Baptist, MD;  Location: [place];  Service: ENT;  Laterality: Right;  . NASAL SEPTOPLASTY W/ TURBINOPLASTY Bilateral 08/18/2018   Procedure: NASAL SEPTOPLASTY WITH TURBINATE REDUCTION;  Surgeon: Leta Baptist, MD;  Location: [place];  Service: ENT;  Laterality: Bilateral;  . NASAL SEPTUM SURGERY  08/18/2018  . ORIF SHOULDER FRACTURE Right 02/28/2016   Procedure: OPEN REDUCTION INTERNAL FIXATION (ORIF) SHOULDER FRACTURE;  Surgeon: Carole Civil, MD;  Location: [facility];  Service: Orthopedics;  Laterality: Right;  . SHOULDER CLOSED REDUCTION Right 12/04/2015   Procedure: CLOSED REDUCTION SHOULDER;  Surgeon: Carole Civil, MD;  Location: [facility];  Service: Orthopedics;  Laterality: Right;  . TOOTH EXTRACTION  spring 2016   top left     Home Medications:  Allergies as of 06/12/2020        Reactions   Methylphenidate Derivatives Other (See Comments)   Patient states it made him "act like a zombie" when given as a child      Medication List       Accurate as of June 12, 2020 10:43 AM. If you have any questions, ask your nurse or doctor.        amLODipine 10 MG tablet Commonly known as: NORVASC Take 1 tablet (10 mg total) by mouth daily.   blood glucose meter kit and supplies Dispense based on patient and insurance preference. Use up to four times daily as directed. (FOR ICD-10 E10.9, E11.9).   chlorthalidone 50 MG tablet Commonly known as: HYGROTON Take 50 mg by mouth daily.   citalopram 20 MG tablet Commonly known as: CELEXA Take 20 mg by mouth  daily.   gemfibrozil 600 MG tablet Commonly known as: LOPID Take 600 mg by mouth 2 (two) times daily.   insulin glargine 100 UNIT/ML Solostar Pen Commonly known as: LANTUS Inject 10 Units into the skin daily for 30 days. What changed: how much to take   Insulin Pen Needle 31G X 8 MM Misc Commonly known as: AboutTime Pen Needle Use as needed to inject insulin daily as instructed   lisinopril 40 MG tablet Commonly known as: ZESTRIL Take 1 tablet (40 mg total) by mouth daily.   LORazepam 1 MG tablet Commonly known as: Ativan Take 1 tablet (1 mg total) by mouth every 8 (eight) hours as needed for anxiety.   metFORMIN 500 MG tablet Commonly known as: GLUCOPHAGE Take 1,000 mg by mouth 2 (two) times daily.   metoCLOPramide 10 MG tablet Commonly known as: REGLAN Take 1 tablet (10 mg total) by mouth every 6 (six) hours as needed for nausea.   ondansetron 4 MG disintegrating tablet Commonly known as: Zofran ODT Take 1 tablet (4 mg total) by mouth every 8 (eight) hours as needed for nausea or vomiting.   ondansetron 4 MG tablet Commonly known as: ZOFRAN Take 1 tablet (4 mg total) by mouth every 8 (eight) hours as needed.   oxyCODONE-acetaminophen 5-325 MG tablet Commonly known as:  PERCOCET/ROXICET Take 1 tablet by mouth every 6 (six) hours as needed for severe pain.   pantoprazole 40 MG tablet Commonly known as: PROTONIX Take 1 tablet (40 mg total) by mouth daily.   polyethylene glycol 17 g packet Commonly known as: MiraLax Take 17 g by mouth daily.   potassium chloride SA 20 MEQ tablet Commonly known as: KLOR-CON Take 20 mEq by mouth daily.   promethazine 25 MG suppository Commonly known as: PHENERGAN Place 1 suppository (25 mg total) rectally every 6 (six) hours as needed for nausea or vomiting.   tamsulosin 0.4 MG Caps capsule Commonly known as: Flomax Take 1 po QD until you pass the stone.       Allergies:  Allergies  Allergen Reactions  . Methylphenidate Derivatives Other (See Comments)    Patient states it made him "act like a zombie" when given as a child    Family History  Problem Relation Age of Onset  . Diabetes Mother   . Diabetes Father     Social History:  reports that he quit smoking about 11 years ago. His smoking use included e-cigarettes. He has a 2.50 pack-year smoking history. He has never used smokeless tobacco. He reports previous alcohol use. He reports that he does not use drugs.  ROS: A complete review of systems was performed.  All systems are negative except for pertinent findings as noted.  Physical Exam:  Vital signs in last 24 hours: There were no vitals taken for this visit. Constitutional:  Alert and oriented, No acute distress Cardiovascular: Regular rate  Respiratory: Normal respiratory effort Neurologic: Grossly intact, no focal deficits Psychiatric: Normal mood and affect  I have reviewed prior pt notes  I have reviewed notes from referring/previous physicians  I have reviewed urinalysis results  I have independently reviewed prior imaging--recent CT scans    Impression/Assessment:  1. Images reviewed and stone has likely passed  2. Penile disorder - Penis is buried but otherwise asymptomatic.  He requires no intervention at this time.  Plan:  1. Stone prevention diet discussed - increase water, decrease salt.  2. F/U PRN  CC: Dr. Stoney Bang

## 2020-06-25 DIAGNOSIS — I10 Essential (primary) hypertension: Secondary | ICD-10-CM | POA: Diagnosis not present

## 2020-06-25 DIAGNOSIS — G4730 Sleep apnea, unspecified: Secondary | ICD-10-CM | POA: Diagnosis not present

## 2020-06-25 DIAGNOSIS — I209 Angina pectoris, unspecified: Secondary | ICD-10-CM | POA: Diagnosis not present

## 2020-06-25 DIAGNOSIS — R0600 Dyspnea, unspecified: Secondary | ICD-10-CM | POA: Diagnosis not present

## 2020-06-26 DIAGNOSIS — I209 Angina pectoris, unspecified: Secondary | ICD-10-CM | POA: Diagnosis not present

## 2020-06-26 DIAGNOSIS — Z6841 Body Mass Index (BMI) 40.0 and over, adult: Secondary | ICD-10-CM | POA: Diagnosis not present

## 2020-06-26 DIAGNOSIS — E785 Hyperlipidemia, unspecified: Secondary | ICD-10-CM | POA: Diagnosis not present

## 2020-06-26 DIAGNOSIS — Z9114 Patient's other noncompliance with medication regimen: Secondary | ICD-10-CM | POA: Diagnosis not present

## 2020-06-26 DIAGNOSIS — I10 Essential (primary) hypertension: Secondary | ICD-10-CM | POA: Diagnosis not present

## 2020-07-05 DIAGNOSIS — R7989 Other specified abnormal findings of blood chemistry: Secondary | ICD-10-CM | POA: Diagnosis not present

## 2020-07-05 DIAGNOSIS — D751 Secondary polycythemia: Secondary | ICD-10-CM | POA: Diagnosis not present

## 2020-07-09 DIAGNOSIS — G4730 Sleep apnea, unspecified: Secondary | ICD-10-CM | POA: Diagnosis not present

## 2020-07-09 DIAGNOSIS — J309 Allergic rhinitis, unspecified: Secondary | ICD-10-CM | POA: Diagnosis not present

## 2020-07-09 DIAGNOSIS — Z1331 Encounter for screening for depression: Secondary | ICD-10-CM | POA: Diagnosis not present

## 2020-07-09 DIAGNOSIS — I201 Angina pectoris with documented spasm: Secondary | ICD-10-CM | POA: Diagnosis not present

## 2020-07-09 DIAGNOSIS — K219 Gastro-esophageal reflux disease without esophagitis: Secondary | ICD-10-CM | POA: Diagnosis not present

## 2020-07-09 DIAGNOSIS — Z0000 Encounter for general adult medical examination without abnormal findings: Secondary | ICD-10-CM | POA: Diagnosis not present

## 2020-07-09 DIAGNOSIS — E1142 Type 2 diabetes mellitus with diabetic polyneuropathy: Secondary | ICD-10-CM | POA: Diagnosis not present

## 2020-07-09 DIAGNOSIS — I10 Essential (primary) hypertension: Secondary | ICD-10-CM | POA: Diagnosis not present

## 2020-07-11 DIAGNOSIS — C9480 Other specified leukemias not having achieved remission: Secondary | ICD-10-CM | POA: Diagnosis not present

## 2020-07-11 DIAGNOSIS — K760 Fatty (change of) liver, not elsewhere classified: Secondary | ICD-10-CM | POA: Diagnosis not present

## 2020-07-11 DIAGNOSIS — E039 Hypothyroidism, unspecified: Secondary | ICD-10-CM | POA: Diagnosis not present

## 2020-07-11 DIAGNOSIS — Z7982 Long term (current) use of aspirin: Secondary | ICD-10-CM | POA: Diagnosis not present

## 2020-07-11 DIAGNOSIS — Z9119 Patient's noncompliance with other medical treatment and regimen: Secondary | ICD-10-CM | POA: Diagnosis not present

## 2020-07-11 DIAGNOSIS — E119 Type 2 diabetes mellitus without complications: Secondary | ICD-10-CM | POA: Diagnosis not present

## 2020-07-11 DIAGNOSIS — R0981 Nasal congestion: Secondary | ICD-10-CM | POA: Diagnosis not present

## 2020-07-11 DIAGNOSIS — J449 Chronic obstructive pulmonary disease, unspecified: Secondary | ICD-10-CM | POA: Diagnosis not present

## 2020-07-11 DIAGNOSIS — K219 Gastro-esophageal reflux disease without esophagitis: Secondary | ICD-10-CM | POA: Diagnosis not present

## 2020-07-11 DIAGNOSIS — F17200 Nicotine dependence, unspecified, uncomplicated: Secondary | ICD-10-CM | POA: Diagnosis not present

## 2020-07-11 DIAGNOSIS — G8918 Other acute postprocedural pain: Secondary | ICD-10-CM | POA: Diagnosis not present

## 2020-07-11 DIAGNOSIS — D751 Secondary polycythemia: Secondary | ICD-10-CM | POA: Diagnosis not present

## 2020-07-11 DIAGNOSIS — K8020 Calculus of gallbladder without cholecystitis without obstruction: Secondary | ICD-10-CM | POA: Diagnosis not present

## 2020-07-11 DIAGNOSIS — Z9114 Patient's other noncompliance with medication regimen: Secondary | ICD-10-CM | POA: Diagnosis not present

## 2020-07-11 DIAGNOSIS — F909 Attention-deficit hyperactivity disorder, unspecified type: Secondary | ICD-10-CM | POA: Diagnosis not present

## 2020-07-11 DIAGNOSIS — G4733 Obstructive sleep apnea (adult) (pediatric): Secondary | ICD-10-CM | POA: Diagnosis not present

## 2020-07-11 DIAGNOSIS — F319 Bipolar disorder, unspecified: Secondary | ICD-10-CM | POA: Diagnosis not present

## 2020-07-11 DIAGNOSIS — I10 Essential (primary) hypertension: Secondary | ICD-10-CM | POA: Diagnosis not present

## 2020-07-11 IMAGING — DX DG ABDOMEN ACUTE W/ 1V CHEST
5 series · 5 of 5 positions shown · non-contrast
Comparison: None.

CLINICAL DATA: Constipation

EXAM:
DG ABDOMEN ACUTE W/ 1V CHEST

[chest pa (1 of 2)]
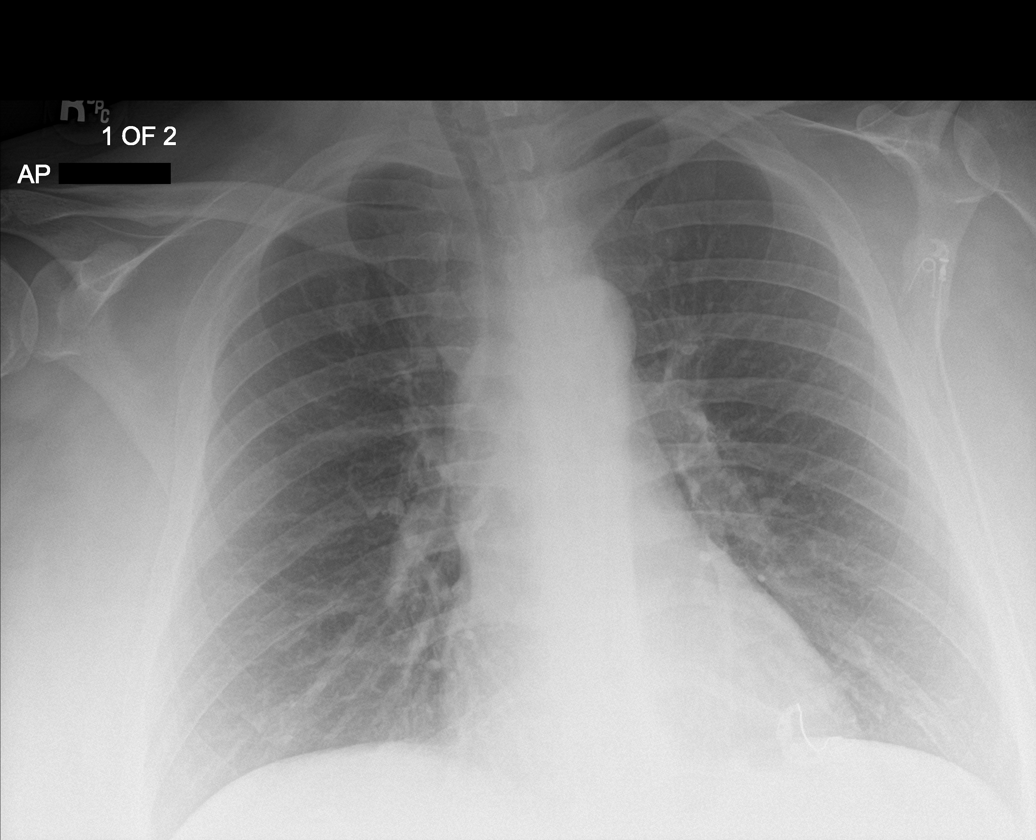

[abdomen erect]
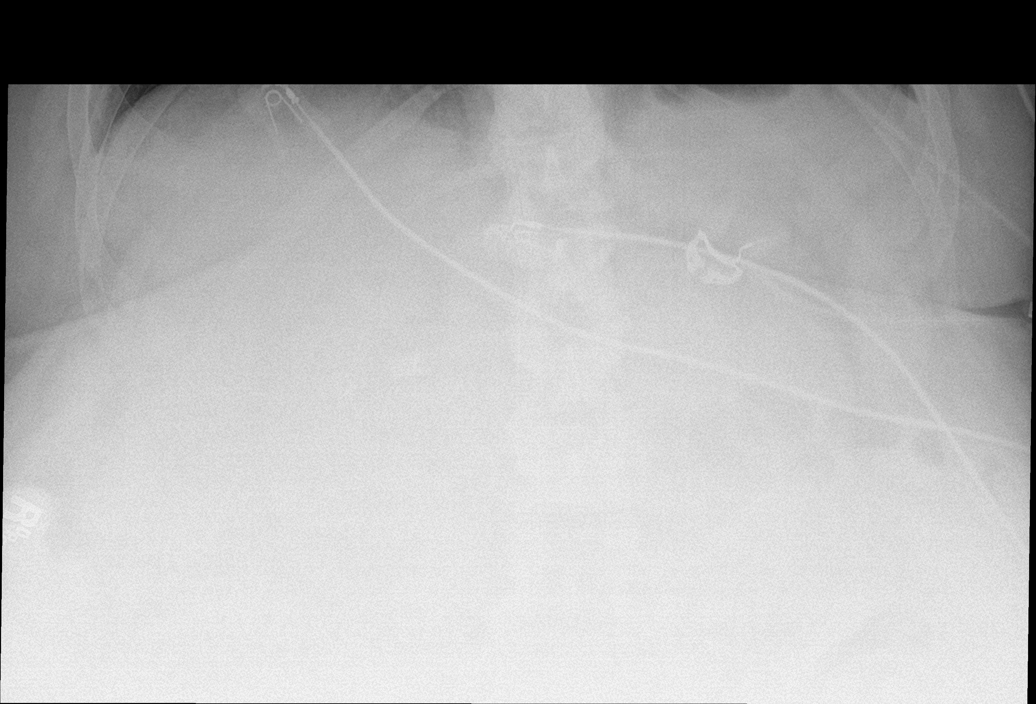

[abdomen supine (1 of 2)]
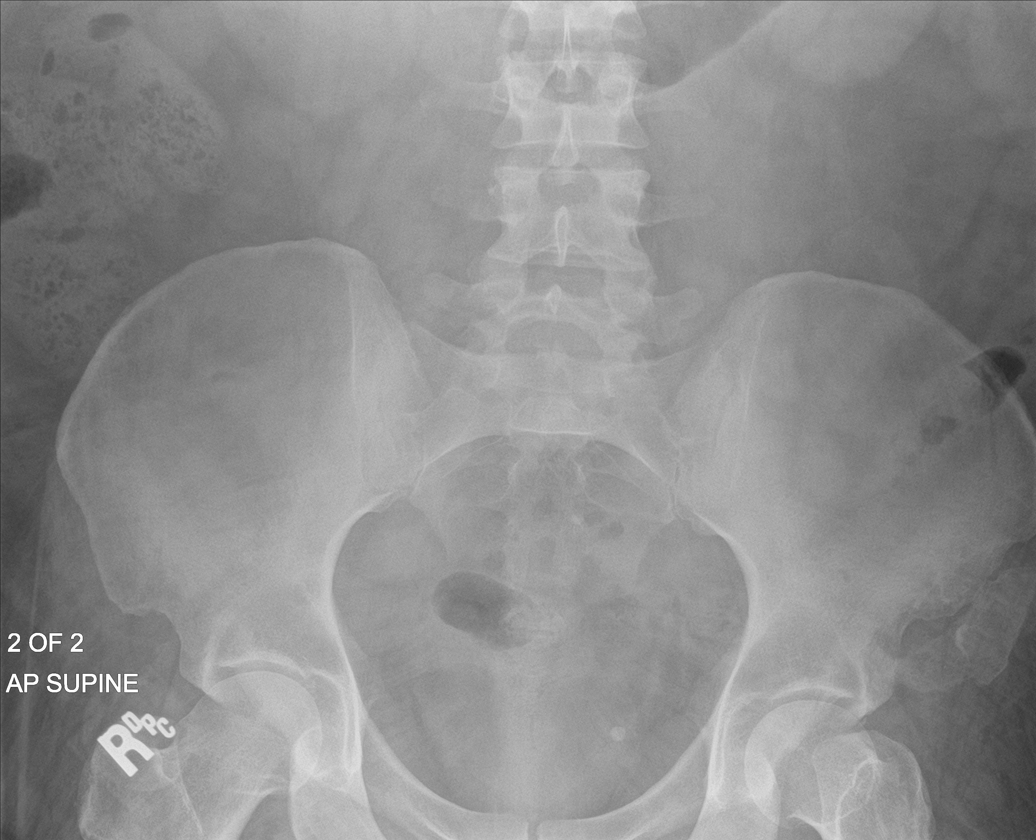

[chest pa (2 of 2)]
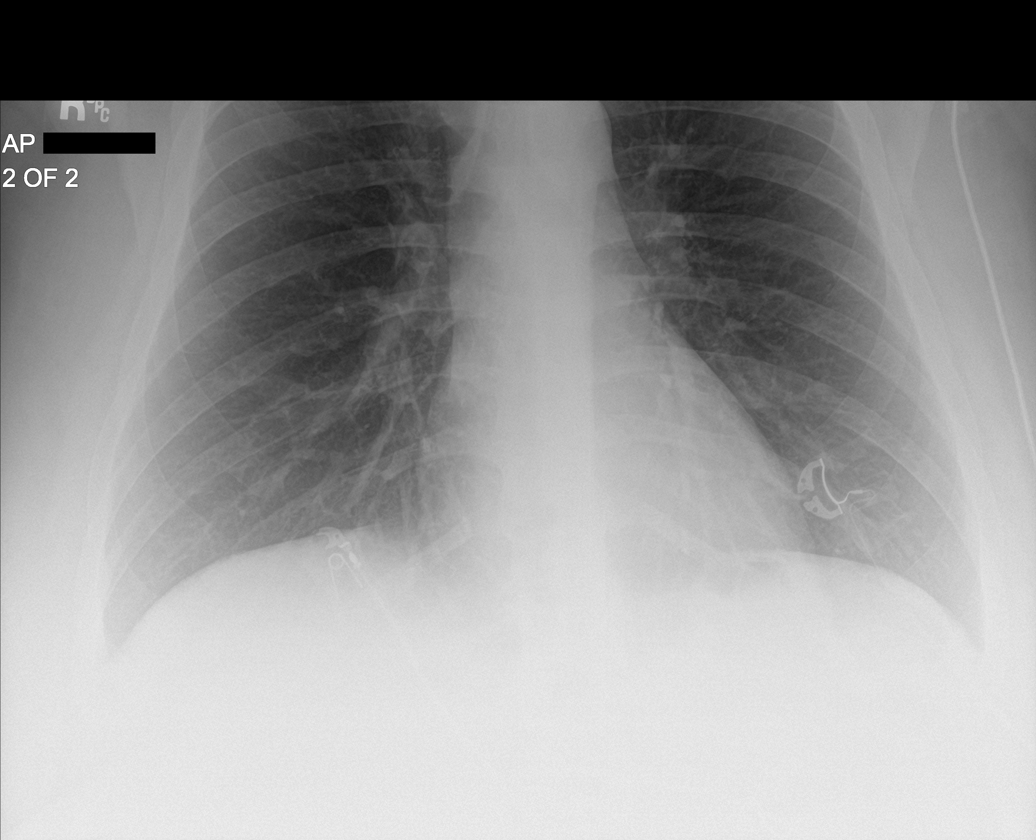

[abdomen supine (2 of 2)]
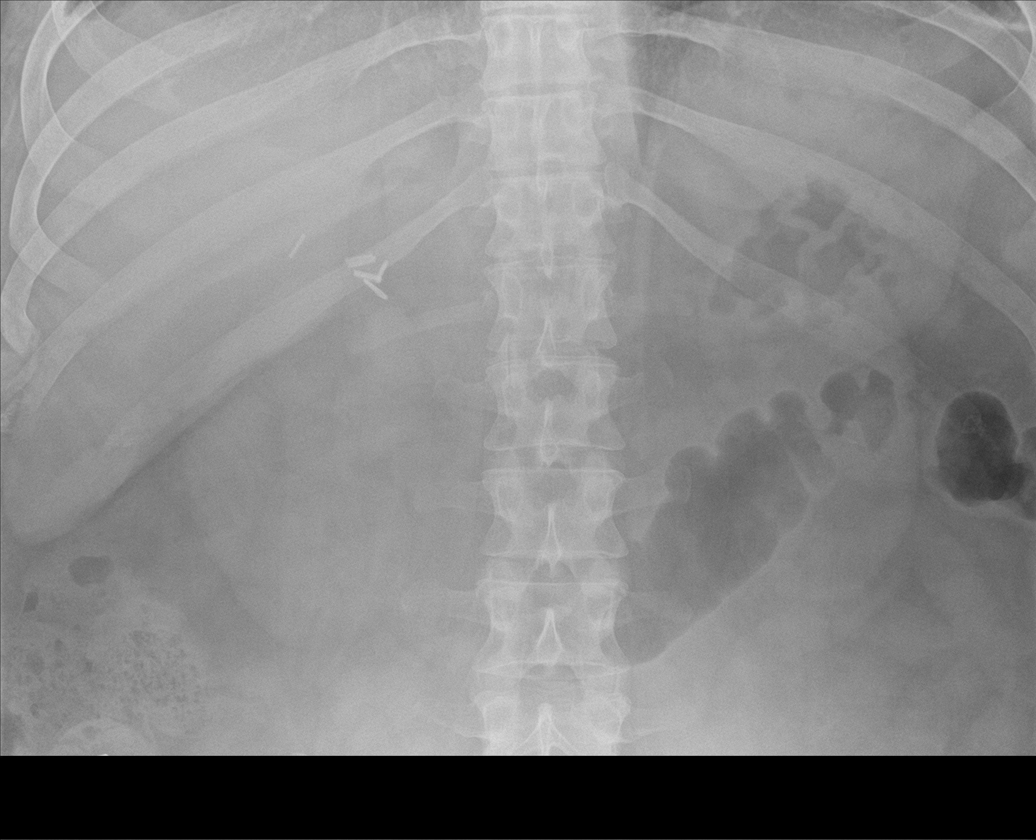

[5 of 5 positions shown; findings below may reference images not displayed]

FINDINGS: There is no evidence of dilated bowel loops or free intraperitoneal
air. Moderate amount of right colonic stool. Surgical clips in the
right upper quadrant. No radiopaque calculi or other significant
radiographic abnormality is seen. Heart size and mediastinal
contours are within normal limits. Both lungs are clear.
IMPRESSION: Moderate amount of right colonic stool. No definite evidence of
obstruction. No acute cardiopulmonary disease.

## 2020-08-17 ENCOUNTER — Encounter (INDEPENDENT_AMBULATORY_CARE_PROVIDER_SITE_OTHER): Payer: Self-pay | Admitting: *Deleted

## 2020-09-11 ENCOUNTER — Ambulatory Visit: Payer: Medicare Other | Admitting: Orthopaedic Surgery

## 2020-09-20 ENCOUNTER — Encounter: Payer: Self-pay | Admitting: Orthopaedic Surgery

## 2020-09-20 ENCOUNTER — Ambulatory Visit: Payer: Medicare Other

## 2020-09-20 ENCOUNTER — Ambulatory Visit (INDEPENDENT_AMBULATORY_CARE_PROVIDER_SITE_OTHER): Payer: Medicare Other | Admitting: Orthopaedic Surgery

## 2020-09-20 ENCOUNTER — Other Ambulatory Visit: Payer: Self-pay

## 2020-09-20 VITALS — BP 164/105 | HR 101 | Ht 71.0 in | Wt 306.0 lb

## 2020-09-20 DIAGNOSIS — G8929 Other chronic pain: Secondary | ICD-10-CM

## 2020-09-20 DIAGNOSIS — M5441 Lumbago with sciatica, right side: Secondary | ICD-10-CM | POA: Diagnosis not present

## 2020-09-20 NOTE — Progress Notes (Signed)
Subjective:    Patient ID: Kyle Collins, male    DOB: 1985/10/08, 35 y.o.   MRN: [id number]  HPI He has had pain of the lumbar spine for many years, not getting any better.  He has no trauma, no falls, no weakness.  He has pain in the right lower back that goes below the right knee to the calf area.  He has been seen by Dr. Sherrie Sport  in [place] on 07-09-20 (I have reviewed the notes) and has been on pain medicine but not any NSAIDs that I can see.  He is on multiple medicines.  He is concerned he is not getting any better and hurts all the time.  He has no bowel or bladder problems.     Review of Systems  Constitutional: Positive for activity change.  Musculoskeletal: Positive for arthralgias and back pain.  Neurological: Positive for headaches.  Psychiatric/Behavioral: The patient is nervous/anxious.   All other systems reviewed and are negative.  For Review of Systems, all other systems reviewed and are negative.  The following is a summary of the past history medically, past history surgically, known current medicines, social history and family history.  This information is gathered electronically by the computer from prior information and documentation.  I review this each visit and have found including this information at this point in the chart is beneficial and informative.   Past Medical History:  Diagnosis Date  . ADHD (attention deficit hyperactivity disorder)   . Anxiety   . Bipolar disorder ([place])   . Depression   . Diabetes mellitus   . GERD (gastroesophageal reflux disease)   . Headache   . Hemochromatosis 2019  . Hypertension   . Polycythemia   . Sleep apnea   . Thyroid disease     Past Surgical History:  Procedure Laterality Date  . CHOLECYSTECTOMY N/A 11/24/2018   Procedure: LAPAROSCOPIC CHOLECYSTECTOMY;  Surgeon: Aviva Signs, MD;  Location: [facility];  Service: General;  Laterality: N/A;  . CIRCUMCISION N/A 09/14/2017   Procedure: CIRCUMCISION;  Surgeon: Cleon Gustin, MD;  Location: [facility];  Service: Urology;  Laterality: N/A;  1 HR 336 [phone] - He has a Education officer, museum that needs to be called if changes made Mateo Flow @ [place] [facility]  . HERNIA REPAIR     scrotum  . MAXILLARY ANTROSTOMY Right 08/18/2018   Procedure: ENDOSCOPIC MAXILLARY RIGHT ANTROSTOMY WITH TISSUE REMOVAL;  Surgeon: Leta Baptist, MD;  Location: [place];  Service: ENT;  Laterality: Right;  . NASAL SEPTOPLASTY W/ TURBINOPLASTY Bilateral 08/18/2018   Procedure: NASAL SEPTOPLASTY WITH TURBINATE REDUCTION;  Surgeon: Leta Baptist, MD;  Location: [place];  Service: ENT;  Laterality: Bilateral;  . NASAL SEPTUM SURGERY  08/18/2018  . ORIF SHOULDER FRACTURE Right 02/28/2016   Procedure: OPEN REDUCTION INTERNAL FIXATION (ORIF) SHOULDER FRACTURE;  Surgeon: Carole Civil, MD;  Location: [facility];  Service: Orthopedics;  Laterality: Right;  . SHOULDER CLOSED REDUCTION Right 12/04/2015   Procedure: CLOSED REDUCTION SHOULDER;  Surgeon: Carole Civil, MD;  Location: [facility];  Service: Orthopedics;  Laterality: Right;  . TOOTH EXTRACTION  spring 2016   top left     Current Outpatient Medications on File Prior to Visit  Medication Sig Dispense Refill  . amLODipine (NORVASC) 10 MG tablet Take 1 tablet (10 mg total) by mouth daily. 30 tablet 1  . atorvastatin (LIPITOR) 10 MG tablet Take by mouth.    . blood glucose meter kit and supplies  Dispense based on patient and insurance preference. Use up to four times daily as directed. (FOR ICD-10 E10.9, E11.9). 1 each 0  . chlorthalidone (HYGROTON) 50 MG tablet Take 50 mg by mouth daily.    . citalopram (CELEXA) 20 MG tablet Take 20 mg by mouth daily.     Marland Kitchen gemfibrozil (LOPID) 600 MG tablet Take 600 mg by mouth 2 (two) times daily.    . Insulin Pen Needle (ABOUTTIME PEN NEEDLE) 31G X 8 MM MISC Use as needed to inject insulin daily as instructed 150 each 1  . JARDIANCE 10 MG TABS tablet Take 10 mg by mouth daily.    Marland Kitchen  lisinopril (PRINIVIL,ZESTRIL) 40 MG tablet Take 1 tablet (40 mg total) by mouth daily. 30 tablet 1  . LORazepam (ATIVAN) 1 MG tablet Take 1 tablet (1 mg total) by mouth every 8 (eight) hours as needed for anxiety. 15 tablet 0  . metFORMIN (GLUCOPHAGE) 500 MG tablet Take 1,000 mg by mouth 2 (two) times daily.    . metoCLOPramide (REGLAN) 10 MG tablet Take 1 tablet (10 mg total) by mouth every 6 (six) hours as needed for nausea. 30 tablet 0  . metoprolol succinate (TOPROL-XL) 100 MG 24 hr tablet TAKE 1 TABLET BY MOUTH2DAILY    . ondansetron (ZOFRAN ODT) 4 MG disintegrating tablet Take 1 tablet (4 mg total) by mouth every 8 (eight) hours as needed for nausea or vomiting. 20 tablet 0  . ondansetron (ZOFRAN) 4 MG tablet Take 1 tablet (4 mg total) by mouth every 8 (eight) hours as needed. 8 tablet 0  . oxyCODONE-acetaminophen (PERCOCET/ROXICET) 5-325 MG tablet Take 1 tablet by mouth every 6 (six) hours as needed for severe pain. 12 tablet 0  . pantoprazole (PROTONIX) 40 MG tablet Take 1 tablet (40 mg total) by mouth daily. 15 tablet 1  . polyethylene glycol (MIRALAX) 17 g packet Take 17 g by mouth daily. 30 each 0  . potassium chloride SA (KLOR-CON) 20 MEQ tablet Take 20 mEq by mouth daily.    . promethazine (PHENERGAN) 25 MG suppository Place 1 suppository (25 mg total) rectally every 6 (six) hours as needed for nausea or vomiting. 4 each 0  . tamsulosin (FLOMAX) 0.4 MG CAPS capsule Take 1 po QD until you pass the stone. 10 capsule 0  . Insulin Glargine (LANTUS) 100 UNIT/ML Solostar Pen Inject 10 Units into the skin daily for 30 days. (Patient taking differently: Inject 15 Units into the skin daily. ) 3 mL 0   No current facility-administered medications on file prior to visit.    Social History   Socioeconomic History  . Marital status: Single    Spouse name: Not on file  . Number of children: Not on file  . Years of education: Not on file  . Highest education level: Not on file  Occupational  History  . Not on file  Tobacco Use  . Smoking status: Former Smoker    Packs/day: 0.50    Years: 5.00    Pack years: 2.50    Types: E-cigarettes    Quit date: 02/24/2009    Years since quitting: 11.5  . Smokeless tobacco: Never Used  Vaping Use  . Vaping Use: Some days  Substance and Sexual Activity  . Alcohol use: Not Currently    Comment: once year  . Drug use: No  . Sexual activity: Never  Other Topics Concern  . Not on file  Social History Narrative  . Not on file  Social Determinants of Health   Financial Resource Strain: Not on file  Food Insecurity: Not on file  Transportation Needs: Not on file  Physical Activity: Not on file  Stress: Not on file  Social Connections: Not on file  Intimate Partner Violence: Not on file    Family History  Problem Relation Age of Onset  . Diabetes Mother   . Diabetes Father     BP (!) 164/105   Pulse (!) 101   Ht $R'5\' 11"'vt$  (1.803 m)   Wt (!) 306 lb (138.8 kg)   BMI 42.68 kg/m   Body mass index is 42.68 kg/m.      Objective:   Physical Exam Vitals and nursing note reviewed. Exam conducted with a chaperone present.  Constitutional:      Appearance: He is well-developed and well-nourished.  HENT:     Head: Normocephalic and atraumatic.  Eyes:     Extraocular Movements: EOM normal.     Conjunctiva/sclera: Conjunctivae normal.     Pupils: Pupils are equal, round, and reactive to light.  Cardiovascular:     Rate and Rhythm: Normal rate and regular rhythm.     Pulses: Intact distal pulses.  Pulmonary:     Effort: Pulmonary effort is normal.  Abdominal:     Palpations: Abdomen is soft.  Musculoskeletal:       Arms:     Cervical back: Normal range of motion and neck supple.  Skin:    General: Skin is warm and dry.  Neurological:     Mental Status: He is alert and oriented to person, place, and time.     Cranial Nerves: No cranial nerve deficit.     Motor: No abnormal muscle tone.     Coordination: Coordination  normal.     Deep Tendon Reflexes: Reflexes are normal and symmetric. Reflexes normal.  Psychiatric:        Mood and Affect: Mood and affect normal.        Behavior: Behavior normal.        Thought Content: Thought content normal.        Judgment: Judgment normal.    X-rays were done of the lumbar spine, reported separately.       Assessment & Plan:   Encounter Diagnosis  Name Primary?  . Chronic right-sided low back pain with right-sided sciatica Yes   I will get MRI of the lumbar spine.  I have recommended Aleve one bid pc.  Return in two weeks.  Call if any problem.  Precautions discussed.   Electronically Signed Sanjuana Kava, MD 2/24/20229:24 AM

## 2020-09-28 ENCOUNTER — Emergency Department (HOSPITAL_COMMUNITY)
Admission: EM | Admit: 2020-09-28 | Discharge: 2020-09-28 | Disposition: A | Discharge status: Home / Self Care | Payer: Medicare Other | Attending: Emergency Medicine | Admitting: Emergency Medicine

## 2020-09-28 ENCOUNTER — Encounter (HOSPITAL_COMMUNITY): Payer: Self-pay | Admitting: *Deleted

## 2020-09-28 ENCOUNTER — Emergency Department (HOSPITAL_COMMUNITY): Payer: Medicare Other

## 2020-09-28 ENCOUNTER — Other Ambulatory Visit: Payer: Self-pay

## 2020-09-28 DIAGNOSIS — K219 Gastro-esophageal reflux disease without esophagitis: Secondary | ICD-10-CM | POA: Diagnosis not present

## 2020-09-28 DIAGNOSIS — R1031 Right lower quadrant pain: Secondary | ICD-10-CM | POA: Insufficient documentation

## 2020-09-28 DIAGNOSIS — Z79899 Other long term (current) drug therapy: Secondary | ICD-10-CM | POA: Insufficient documentation

## 2020-09-28 DIAGNOSIS — R110 Nausea: Secondary | ICD-10-CM | POA: Insufficient documentation

## 2020-09-28 DIAGNOSIS — R630 Anorexia: Secondary | ICD-10-CM | POA: Diagnosis not present

## 2020-09-28 DIAGNOSIS — Z87891 Personal history of nicotine dependence: Secondary | ICD-10-CM | POA: Diagnosis not present

## 2020-09-28 DIAGNOSIS — R000 Tachycardia, unspecified: Secondary | ICD-10-CM | POA: Insufficient documentation

## 2020-09-28 DIAGNOSIS — Z794 Long term (current) use of insulin: Secondary | ICD-10-CM | POA: Insufficient documentation

## 2020-09-28 DIAGNOSIS — Z7984 Long term (current) use of oral hypoglycemic drugs: Secondary | ICD-10-CM | POA: Insufficient documentation

## 2020-09-28 DIAGNOSIS — E119 Type 2 diabetes mellitus without complications: Secondary | ICD-10-CM | POA: Insufficient documentation

## 2020-09-28 DIAGNOSIS — I10 Essential (primary) hypertension: Secondary | ICD-10-CM | POA: Diagnosis not present

## 2020-09-28 LAB — COMPREHENSIVE METABOLIC PANEL
ALT: 27 U/L (ref 0–44)
AST: 21 U/L (ref 15–41)
Albumin: 4.9 g/dL (ref 3.5–5.0)
Alkaline Phosphatase: 69 U/L (ref 38–126)
Anion gap: 14 (ref 5–15)
BUN: 14 mg/dL (ref 6–20)
CO2: 23 mmol/L (ref 22–32)
Calcium: 9.5 mg/dL (ref 8.9–10.3)
Chloride: 102 mmol/L (ref 98–111)
Creatinine, Ser: 0.92 mg/dL (ref 0.61–1.24)
GFR, Estimated: >60 mL/min (ref >60)
Glucose, Bld: 159 mg/dL — ABNORMAL HIGH (ref 70–99)
Potassium: 3.6 mmol/L (ref 3.5–5.1)
Sodium: 139 mmol/L (ref 135–145)
Total Bilirubin: 2.5 mg/dL — ABNORMAL HIGH (ref 0.3–1.2)
Total Protein: 7.9 g/dL (ref 6.5–8.1)

## 2020-09-28 LAB — URINALYSIS, ROUTINE W REFLEX MICROSCOPIC
Bacteria, UA: NONE SEEN
Bilirubin Urine: NEGATIVE
Glucose, UA: >=500 mg/dL — AB
Ketones, ur: 80 mg/dL — AB
Leukocytes,Ua: NEGATIVE
Nitrite: NEGATIVE
Protein, ur: NEGATIVE mg/dL
Specific Gravity, Urine: 1.04 — ABNORMAL HIGH (ref 1.005–1.030)
pH: 6 (ref 5.0–8.0)

## 2020-09-28 LAB — CBC WITH DIFFERENTIAL/PLATELET
Abs Immature Granulocytes: 0.01 10*3/uL (ref 0.00–0.07)
Basophils Absolute: 0 10*3/uL (ref 0.0–0.1)
Basophils Relative: 1 %
Eosinophils Absolute: 0.1 10*3/uL (ref 0.0–0.5)
Eosinophils Relative: 1 %
HCT: 49.2 % (ref 39.0–52.0)
Hemoglobin: 16.7 g/dL (ref 13.0–17.0)
Immature Granulocytes: 0 %
Lymphocytes Relative: 29 %
Lymphs Abs: 2.4 10*3/uL (ref 0.7–4.0)
MCH: 30.3 pg (ref 26.0–34.0)
MCHC: 33.9 g/dL (ref 30.0–36.0)
MCV: 89.1 fL (ref 80.0–100.0)
Monocytes Absolute: 0.7 10*3/uL (ref 0.1–1.0)
Monocytes Relative: 8 %
Neutro Abs: 5.1 10*3/uL (ref 1.7–7.7)
Neutrophils Relative %: 61 %
Platelets: 257 10*3/uL (ref 150–400)
RBC: 5.52 MIL/uL (ref 4.22–5.81)
RDW: 12.4 % (ref 11.5–15.5)
WBC: 8.2 10*3/uL (ref 4.0–10.5)
nRBC: 0 % (ref 0.0–0.2)

## 2020-09-28 LAB — LIPASE, BLOOD: Lipase: 64 U/L — ABNORMAL HIGH (ref 11–51)

## 2020-09-28 MED ORDER — DICYCLOMINE HCL 20 MG PO TABS: 20.0000 mg | ORAL_TABLET | Freq: Two times a day (BID) | ORAL | 0 refills | Status: DC

## 2020-09-28 MED ORDER — MORPHINE SULFATE (PF) 4 MG/ML IV SOLN
4.0000 mg | Freq: Once | INTRAVENOUS | Status: AC
  Administered 2020-09-28: 4 mg via INTRAVENOUS
  Filled 2020-09-28: qty 1

## 2020-09-28 MED ORDER — IOHEXOL 300 MG/ML  SOLN
100.0000 mL | Freq: Once | INTRAMUSCULAR | Status: AC | PRN
  Administered 2020-09-28: 100 mL via INTRAVENOUS

## 2020-09-28 MED ORDER — ONDANSETRON HCL 4 MG/2ML IJ SOLN
4.0000 mg | Freq: Once | INTRAMUSCULAR | Status: AC
  Administered 2020-09-28: 4 mg via INTRAVENOUS
  Filled 2020-09-28: qty 2

## 2020-09-28 MED ORDER — SODIUM CHLORIDE 0.9 % IV BOLUS
1000.0000 mL | Freq: Once | INTRAVENOUS | Status: AC
  Administered 2020-09-28: 1000 mL via INTRAVENOUS

## 2020-09-28 NOTE — ED Provider Notes (Signed)
[facility] EMERGENCY DEPARTMENT Provider Note   CSN: [id number] Arrival date & time: 09/28/20  1423     History Chief Complaint  Patient presents with  . Abdominal Pain    Kyle Collins is a 35 y.o. male with past medical history significant for bipolar, ADHD, diabetes, hypertension, polycythemia, sleep apnea who presents for evaluation of abdominal pain. Patient states he has had right lower abdominal pain for 3 days. States it hurts no matter where he touches on his anterior abdomen. States he has had decreased appetite however is able to eat a slice of pizza 1 hour PTA. Has had some intermittent nausea however no vomiting. Has had right-sided kidney stones previously. Does have known history of chronic back pain however states this does not feel similar. Pain does not extend into his legs. No fever, chills, chest pain, shortness of breath, hematuria, weakness. Patient states he was unable to urinate when he first got to the emergency department however he relates this more to "dehydration." His current pain a 10/10. Denies additional aggravating or alleviating factors.  History obtained from patient and past medical records. No interpreter used.  HPI     Past Medical History:  Diagnosis Date  . ADHD (attention deficit hyperactivity disorder)   . Anxiety   . Bipolar disorder ([place])   . Depression   . Diabetes mellitus   . GERD (gastroesophageal reflux disease)   . Headache   . Hemochromatosis 2019  . Hypertension   . Polycythemia   . Sleep apnea   . Thyroid disease     Patient Active Problem List   Diagnosis Date Noted  . Abdominal pain   . Gastritis 11/22/2018  . DKA (diabetic ketoacidoses) 11/22/2018  . DKA (diabetic ketoacidosis) ([place]) 11/20/2018  . Sleep apnea 11/20/2018  . GERD (gastroesophageal reflux disease) 11/20/2018  . Polycythemia 11/20/2018  . Hyperbilirubinemia 11/20/2018  . Cholelithiasis 11/20/2018  . S/P nasal septoplasty 08/18/2018  . Fracture  dislocation of right shoulder joint 12/04/2015  . Dislocation, shoulder closed   . Greater tuberosity of humerus fracture   . Morbid obesity due to excess calories ([place]) 10/26/2015  . Uncontrolled type 2 diabetes mellitus without complication, without Hao-term current use of insulin 07/16/2015  . Essential hypertension, benign 07/16/2015  . Personal history of noncompliance with medical treatment, presenting hazards to health 07/16/2015    Past Surgical History:  Procedure Laterality Date  . CHOLECYSTECTOMY N/A 11/24/2018   Procedure: LAPAROSCOPIC CHOLECYSTECTOMY;  Surgeon: Aviva Signs, MD;  Location: [facility];  Service: General;  Laterality: N/A;  . CIRCUMCISION N/A 09/14/2017   Procedure: CIRCUMCISION;  Surgeon: Cleon Gustin, MD;  Location: [facility];  Service: Urology;  Laterality: N/A;  1 HR [phone] - He has a Education officer, museum that needs to be called if changes made [facility] [employer] @ [place] [facility]  . HERNIA REPAIR     scrotum  . MAXILLARY ANTROSTOMY Right 08/18/2018   Procedure: ENDOSCOPIC MAXILLARY RIGHT ANTROSTOMY WITH TISSUE REMOVAL;  Surgeon: Leta Baptist, MD;  Location: [place];  Service: ENT;  Laterality: Right;  . NASAL SEPTOPLASTY W/ TURBINOPLASTY Bilateral 08/18/2018   Procedure: NASAL SEPTOPLASTY WITH TURBINATE REDUCTION;  Surgeon: Leta Baptist, MD;  Location: [place];  Service: ENT;  Laterality: Bilateral;  . NASAL SEPTUM SURGERY  08/18/2018  . ORIF SHOULDER FRACTURE Right 02/28/2016   Procedure: OPEN REDUCTION INTERNAL FIXATION (ORIF) SHOULDER FRACTURE;  Surgeon: Carole Civil, MD;  Location: [facility];  Service: Orthopedics;  Laterality: Right;  .  SHOULDER CLOSED REDUCTION Right 12/04/2015   Procedure: CLOSED REDUCTION SHOULDER;  Surgeon: Carole Civil, MD;  Location: [facility];  Service: Orthopedics;  Laterality: Right;  . TOOTH EXTRACTION  spring 2016   top left        Family History  Problem Relation Age of Onset  . Diabetes Mother   .  Diabetes Father     Social History   Tobacco Use  . Smoking status: Former Smoker    Packs/day: 0.50    Years: 5.00    Pack years: 2.50    Types: E-cigarettes    Quit date: 02/24/2009    Years since quitting: 11.6  . Smokeless tobacco: Never Used  Vaping Use  . Vaping Use: Some days  Substance Use Topics  . Alcohol use: Not Currently    Comment: once year  . Drug use: No    Home Medications Prior to Admission medications   Medication Sig Start Date End Date Taking? Authorizing Provider  dicyclomine (BENTYL) 20 MG tablet Take 1 tablet (20 mg total) by mouth 2 (two) times daily. 09/28/20  Yes Henderly, Britni A, PA-C  amLODipine (NORVASC) 10 MG tablet Take 1 tablet (10 mg total) by mouth daily. 11/25/18   Barton Dubois, MD  atorvastatin (LIPITOR) 10 MG tablet Take by mouth. 05/08/20   [provider]  blood glucose meter kit and supplies Dispense based on patient and insurance preference. Use up to four times daily as directed. (FOR ICD-10 E10.9, E11.9). 11/24/18   Barton Dubois, MD  chlorthalidone (HYGROTON) 50 MG tablet Take 50 mg by mouth daily. 05/18/19   [provider]  citalopram (CELEXA) 20 MG tablet Take 20 mg by mouth daily.     [provider]  gemfibrozil (LOPID) 600 MG tablet Take 600 mg by mouth 2 (two) times daily. 05/25/19   [provider]  Insulin Glargine (LANTUS) 100 UNIT/ML Solostar Pen Inject 10 Units into the skin daily for 30 days. Patient taking differently: Inject 15 Units into the skin daily.  11/24/18 05/27/19  Barton Dubois, MD  Insulin Pen Needle (ABOUTTIME PEN NEEDLE) 31G X 8 MM MISC Use as needed to inject insulin daily as instructed 11/24/18   Barton Dubois, MD  JARDIANCE 10 MG TABS tablet Take 10 mg by mouth daily. 03/08/20   [provider]  lisinopril (PRINIVIL,ZESTRIL) 40 MG tablet Take 1 tablet (40 mg total) by mouth daily. 06/26/14   Milton Ferguson, MD  LORazepam (ATIVAN) 1 MG tablet Take 1 tablet (1 mg  total) by mouth every 8 (eight) hours as needed for anxiety. 06/09/19   Evalee Jefferson, PA-C  metFORMIN (GLUCOPHAGE) 500 MG tablet Take 1,000 mg by mouth 2 (two) times daily. 05/18/19   [provider]  metoCLOPramide (REGLAN) 10 MG tablet Take 1 tablet (10 mg total) by mouth every 6 (six) hours as needed for nausea. 04/18/20   Evalee Jefferson, PA-C  metoprolol succinate (TOPROL-XL) 100 MG 24 hr tablet TAKE 1 TABLET BY MOUTH2DAILY 05/18/20   [provider]  ondansetron (ZOFRAN ODT) 4 MG disintegrating tablet Take 1 tablet (4 mg total) by mouth every 8 (eight) hours as needed for nausea or vomiting. 06/09/19   Evalee Jefferson, PA-C  ondansetron (ZOFRAN) 4 MG tablet Take 1 tablet (4 mg total) by mouth every 8 (eight) hours as needed. 04/20/20   Rolland Porter, MD  oxyCODONE-acetaminophen (PERCOCET/ROXICET) 5-325 MG tablet Take 1 tablet by mouth every 6 (six) hours as needed for severe pain. 04/20/20  Rolland Porter, MD  pantoprazole (PROTONIX) 40 MG tablet Take 1 tablet (40 mg total) by mouth daily. 05/27/19   Lily Kocher, PA-C  polyethylene glycol (MIRALAX) 17 g packet Take 17 g by mouth daily. 04/18/20   Evalee Jefferson, PA-C  potassium chloride SA (KLOR-CON) 20 MEQ tablet Take 20 mEq by mouth daily.    [provider]  promethazine (PHENERGAN) 25 MG suppository Place 1 suppository (25 mg total) rectally every 6 (six) hours as needed for nausea or vomiting. 04/16/20   Sherol Dade E, PA-C  tamsulosin (FLOMAX) 0.4 MG CAPS capsule Take 1 po QD until you pass the stone. 04/20/20   Rolland Porter, MD    Allergies    Methylphenidate derivatives  Review of Systems   Review of Systems  Constitutional: Positive for appetite change. Negative for activity change, fatigue and fever.  HENT: Negative.   Respiratory: Negative.   Cardiovascular: Negative.   Gastrointestinal: Positive for abdominal pain. Negative for abdominal distention, blood in stool, diarrhea, nausea, rectal pain and vomiting.   Genitourinary: Negative.   Musculoskeletal: Negative.   Skin: Negative.   Neurological: Negative.   All other systems reviewed and are negative.   Physical Exam Updated Vital Signs BP (!) 159/114 (BP Location: Right Arm)   Pulse (!) 123   Temp 98.5 F (36.9 C) (Oral)   Resp 20   Ht $R'5\' 11"'xl$  (1.803 m)   Wt (!) 138.8 kg   SpO2 94%   BMI 42.68 kg/m   Physical Exam Vitals and nursing note reviewed.  Constitutional:      General: He is not in acute distress.    Appearance: He is well-developed and well-nourished. He is not ill-appearing, toxic-appearing or diaphoretic.  HENT:     Head: Normocephalic and atraumatic.     Mouth/Throat:     Mouth: Mucous membranes are moist.  Eyes:     Pupils: Pupils are equal, round, and reactive to light.  Cardiovascular:     Rate and Rhythm: Regular rhythm. Tachycardia present.     Heart sounds: Normal heart sounds.  Pulmonary:     Effort: Pulmonary effort is normal. No respiratory distress.  Abdominal:     General: There is no distension.     Palpations: Abdomen is soft.     Tenderness: There is generalized abdominal tenderness and tenderness in the right upper quadrant, right lower quadrant, epigastric area, periumbilical area, suprapubic area and left lower quadrant. There is no right CVA tenderness, left CVA tenderness or guarding.     Hernia: No hernia is present.  Musculoskeletal:        General: Normal range of motion.     Cervical back: Normal range of motion and neck supple.  Skin:    General: Skin is warm and dry.     Capillary Refill: Capillary refill takes less than 2 seconds.  Neurological:     General: No focal deficit present.     Mental Status: He is alert.  Psychiatric:        Mood and Affect: Mood and affect normal.     ED Results / Procedures / Treatments   Labs (all labs ordered are listed, but only abnormal results are displayed) Labs Reviewed  COMPREHENSIVE METABOLIC PANEL - Abnormal; Notable for the  following components:      Result Value   Glucose, Bld 159 (*)    Total Bilirubin 2.5 (*)    All other components within normal limits  LIPASE, BLOOD - Abnormal; Notable for the  following components:   Lipase 64 (*)    All other components within normal limits  URINALYSIS, ROUTINE W REFLEX MICROSCOPIC - Abnormal; Notable for the following components:   Specific Gravity, Urine 1.040 (*)    Glucose, UA >=500 (*)    Hgb urine dipstick MODERATE (*)    Ketones, ur 80 (*)    All other components within normal limits  CBC WITH DIFFERENTIAL/PLATELET    EKG None  Radiology CT Abdomen Pelvis W Contrast  Result Date: 09/28/2020 CLINICAL DATA:  Right lower quadrant pain for 3 days. History of prior hernia repair. EXAM: CT ABDOMEN AND PELVIS WITH CONTRAST TECHNIQUE: Multidetector CT imaging of the abdomen and pelvis was performed using the standard protocol following bolus administration of intravenous contrast. CONTRAST:  100 mL OMNIPAQUE IOHEXOL 300 MG/ML  SOLN COMPARISON:  CT abdomen and pelvis 04/20/2020. FINDINGS: Lower chest: Lung bases clear.  No pleural or pericardial effusion. Hepatobiliary: No focal liver abnormality is seen. Status post cholecystectomy. No biliary dilatation. Pancreas: Unremarkable. No pancreatic ductal dilatation or surrounding inflammatory changes. Spleen: Normal in size without focal abnormality. Adrenals/Urinary Tract: The patient has 2 nonobstructing stones in each kidney. Largest stone measures approximately 0.4 cm in diameter in the lower pole of the right kidney. No hydronephrosis or ureteral or urinary bladder stone. Urinary bladder appears normal. The adrenal glands are normal in appearance. Stomach/Bowel: Stomach is within normal limits. Appendix appears normal. No evidence of bowel wall thickening, distention, or inflammatory changes. Vascular/Lymphatic: No significant vascular findings are present. No enlarged abdominal or pelvic lymph nodes. Reproductive: Prostate  is unremarkable. Other: None. Musculoskeletal: No acute bony abnormality. Chronic bilateral L5 pars interarticularis defects without anterolisthesis noted. IMPRESSION: No acute abnormality abdomen or pelvis.  Negative for appendicitis. 2 small, nonobstructing stones are seen in each kidney. Electronically Signed   By: Inge Rise M.D.   On: 09/28/2020 16:40    Procedures Procedures   Medications Ordered in ED Medications  morphine 4 MG/ML injection 4 mg (4 mg Intravenous Given 09/28/20 1517)  ondansetron (ZOFRAN) injection 4 mg (4 mg Intravenous Given 09/28/20 1517)  sodium chloride 0.9 % bolus 1,000 mL (0 mLs Intravenous Stopped 09/28/20 1652)  iohexol (OMNIPAQUE) 300 MG/ML solution 100 mL (100 mLs Intravenous Contrast Given 09/28/20 1622)    ED Course  I have reviewed the triage vital signs and the nursing notes.  Pertinent labs & imaging results that were available during my care of the patient were reviewed by me and considered in my medical decision making (see chart for details).  35 year old here for evaluation of right lower quadrant pain over the last 3 days. On arrival he is afebrile, nonseptic, non-ill-appearing. He is tachycardic however does not appear septic. Not currently in distress. His heart and lungs are clear. Abdomen does have some diffuse tenderness to his entire right upper and lower abdomen. Negative CVA tap bilaterally. We will plan on labs, imaging and reassess  Labs and imaging personally reviewed and interpreted:  CBC without leukocytosis CMP mild hyperglycemia at 159, T bili 2.5, similar to prior Lipase 64 similar to prior UA negative for infection CT [facility] without acute abnormality  Patient reassessed. Pain improved.  Tolerating p.o. intake.  Reassuring work-up.  No testicular complaints to suggest torsion.  Unclear etiology.  Discussed Bentyl at home.  He is agreeable for this.  Patient is nontoxic, nonseptic appearing, in no apparent distress.  Patient's pain  and other symptoms adequately managed in emergency department.  Fluid bolus given.  Labs,  imaging and vitals reviewed.  Patient does not meet the SIRS or Sepsis criteria.  On repeat exam patient does not have a surgical abdomin and there are no peritoneal signs.  No indication of appendicitis, bowel obstruction, bowel perforation, cholecystitis, diverticulitis.  Patient discharged home with symptomatic treatment and given strict instructions for follow-up with their primary care physician.  I have also discussed reasons to return immediately to the ER.  Patient expresses understanding and agrees with plan.      MDM Rules/Calculators/A&P                          Final Clinical Impression(s) / ED Diagnoses Final diagnoses:  Right lower quadrant abdominal pain    Rx / DC Orders ED Discharge Orders         Ordered    dicyclomine (BENTYL) 20 MG tablet  2 times daily        09/28/20 1805           Henderly, Britni A, PA-C 09/28/20 1808    Lajean Saver, MD 09/29/20 [phone]

## 2020-09-28 NOTE — ED Triage Notes (Signed)
Right lower quadrant pain for 3 days, crying in triage

## 2020-09-28 NOTE — ED Notes (Signed)
Pt called out with c/o achy chest pain in the middle of chest. EKG performed and given to Dr. Roderic Palau.

## 2020-09-28 NOTE — Discharge Instructions (Addendum)
CT scan and labs are reassuring  Follow up with PCP

## 2020-10-03 ENCOUNTER — Ambulatory Visit (HOSPITAL_COMMUNITY): Payer: Medicare Other

## 2020-10-04 ENCOUNTER — Ambulatory Visit: Payer: Medicare Other | Admitting: Orthopaedic Surgery

## 2020-10-04 DIAGNOSIS — D751 Secondary polycythemia: Secondary | ICD-10-CM | POA: Diagnosis not present

## 2020-10-04 DIAGNOSIS — R7989 Other specified abnormal findings of blood chemistry: Secondary | ICD-10-CM | POA: Diagnosis not present

## 2020-10-08 DIAGNOSIS — G4730 Sleep apnea, unspecified: Secondary | ICD-10-CM | POA: Diagnosis not present

## 2020-10-08 DIAGNOSIS — K219 Gastro-esophageal reflux disease without esophagitis: Secondary | ICD-10-CM | POA: Diagnosis not present

## 2020-10-08 DIAGNOSIS — I10 Essential (primary) hypertension: Secondary | ICD-10-CM | POA: Diagnosis not present

## 2020-10-08 DIAGNOSIS — E1142 Type 2 diabetes mellitus with diabetic polyneuropathy: Secondary | ICD-10-CM | POA: Diagnosis not present

## 2020-10-08 DIAGNOSIS — I201 Angina pectoris with documented spasm: Secondary | ICD-10-CM | POA: Diagnosis not present

## 2020-10-08 DIAGNOSIS — J309 Allergic rhinitis, unspecified: Secondary | ICD-10-CM | POA: Diagnosis not present

## 2020-10-09 DIAGNOSIS — K219 Gastro-esophageal reflux disease without esophagitis: Secondary | ICD-10-CM | POA: Diagnosis not present

## 2020-10-09 DIAGNOSIS — E119 Type 2 diabetes mellitus without complications: Secondary | ICD-10-CM | POA: Diagnosis not present

## 2020-10-09 DIAGNOSIS — Z9119 Patient's noncompliance with other medical treatment and regimen: Secondary | ICD-10-CM | POA: Diagnosis not present

## 2020-10-09 DIAGNOSIS — F909 Attention-deficit hyperactivity disorder, unspecified type: Secondary | ICD-10-CM | POA: Diagnosis not present

## 2020-10-09 DIAGNOSIS — G4733 Obstructive sleep apnea (adult) (pediatric): Secondary | ICD-10-CM | POA: Diagnosis not present

## 2020-10-09 DIAGNOSIS — Z7189 Other specified counseling: Secondary | ICD-10-CM | POA: Diagnosis not present

## 2020-10-09 DIAGNOSIS — E039 Hypothyroidism, unspecified: Secondary | ICD-10-CM | POA: Diagnosis not present

## 2020-10-09 DIAGNOSIS — G4730 Sleep apnea, unspecified: Secondary | ICD-10-CM | POA: Diagnosis not present

## 2020-10-09 DIAGNOSIS — Z794 Long term (current) use of insulin: Secondary | ICD-10-CM | POA: Diagnosis not present

## 2020-10-09 DIAGNOSIS — I10 Essential (primary) hypertension: Secondary | ICD-10-CM | POA: Diagnosis not present

## 2020-10-09 DIAGNOSIS — E1165 Type 2 diabetes mellitus with hyperglycemia: Secondary | ICD-10-CM | POA: Diagnosis not present

## 2020-10-09 DIAGNOSIS — D751 Secondary polycythemia: Secondary | ICD-10-CM | POA: Diagnosis not present

## 2020-10-09 DIAGNOSIS — Z9114 Patient's other noncompliance with medication regimen: Secondary | ICD-10-CM | POA: Diagnosis not present

## 2020-10-09 DIAGNOSIS — F319 Bipolar disorder, unspecified: Secondary | ICD-10-CM | POA: Diagnosis not present

## 2020-10-09 DIAGNOSIS — Z7982 Long term (current) use of aspirin: Secondary | ICD-10-CM | POA: Diagnosis not present

## 2020-10-09 DIAGNOSIS — Z9111 Patient's noncompliance with dietary regimen: Secondary | ICD-10-CM | POA: Diagnosis not present

## 2020-10-16 ENCOUNTER — Ambulatory Visit (HOSPITAL_COMMUNITY)
Admission: RE | Admit: 2020-10-16 | Discharge: 2020-10-16 | Disposition: A | Discharge status: Home / Self Care | Payer: Medicare Other | Source: Ambulatory Visit | Attending: Orthopaedic Surgery | Admitting: Orthopaedic Surgery

## 2020-10-16 ENCOUNTER — Other Ambulatory Visit: Payer: Self-pay

## 2020-10-16 DIAGNOSIS — G8929 Other chronic pain: Secondary | ICD-10-CM | POA: Insufficient documentation

## 2020-10-16 DIAGNOSIS — M5441 Lumbago with sciatica, right side: Secondary | ICD-10-CM | POA: Diagnosis not present

## 2020-10-16 DIAGNOSIS — M545 Low back pain, unspecified: Secondary | ICD-10-CM | POA: Diagnosis not present

## 2020-11-12 DIAGNOSIS — D751 Secondary polycythemia: Secondary | ICD-10-CM | POA: Diagnosis not present

## 2020-11-22 ENCOUNTER — Other Ambulatory Visit: Payer: Self-pay

## 2020-11-22 ENCOUNTER — Encounter (INDEPENDENT_AMBULATORY_CARE_PROVIDER_SITE_OTHER): Payer: Self-pay | Admitting: Gastroenterology

## 2020-11-22 ENCOUNTER — Ambulatory Visit (INDEPENDENT_AMBULATORY_CARE_PROVIDER_SITE_OTHER): Payer: Medicare Other | Admitting: Gastroenterology

## 2020-11-22 ENCOUNTER — Other Ambulatory Visit (INDEPENDENT_AMBULATORY_CARE_PROVIDER_SITE_OTHER): Payer: Self-pay

## 2020-11-22 ENCOUNTER — Telehealth (INDEPENDENT_AMBULATORY_CARE_PROVIDER_SITE_OTHER): Payer: Self-pay

## 2020-11-22 ENCOUNTER — Encounter (INDEPENDENT_AMBULATORY_CARE_PROVIDER_SITE_OTHER): Payer: Self-pay

## 2020-11-22 VITALS — BP 161/117 | HR 105 | Temp 98.6°F | Ht 71.0 in | Wt 310.0 lb

## 2020-11-22 DIAGNOSIS — R1031 Right lower quadrant pain: Secondary | ICD-10-CM

## 2020-11-22 DIAGNOSIS — G8929 Other chronic pain: Secondary | ICD-10-CM | POA: Insufficient documentation

## 2020-11-22 DIAGNOSIS — K219 Gastro-esophageal reflux disease without esophagitis: Secondary | ICD-10-CM

## 2020-11-22 DIAGNOSIS — R1011 Right upper quadrant pain: Secondary | ICD-10-CM | POA: Diagnosis not present

## 2020-11-22 MED ORDER — HYOSCYAMINE SULFATE 0.125 MG SL SUBL: 0.1250 mg | SUBLINGUAL_TABLET | Freq: Four times a day (QID) | SUBLINGUAL | 1 refills | Status: DC | PRN

## 2020-11-22 NOTE — Progress Notes (Signed)
Kyle Collins, M.D. Gastroenterology & Hepatology [facility] [address] [place], [place] [address] Primary Care Physician: Neale Burly, MD [place] [address]  Referring MD: PCP  Chief Complaint: Abdominal pain  History of Present Illness: Kyle Collins is a 35 y.o. male with PMH polycythemia, OSA, DM, HTN and obesity who presents for evaluation of abdominal pain.  Patient reports that for the last 3 years he has had intermittent episodes of RUQ pain that do not radiate to any other area. He had his gallbladder removed 2 years for this as it was thought to be secondary to symptomatic cholelithiasis. States that the pain used to radiate to his back in the past but after his surgery he had nonradiating endoscopic mucosal resection. However, he describes having persistent "soreness" in his abdominal wall, which is intermittent through the day but he feels it on a daily basis. There are no triggers for his abdominal pain, does not happen with any specific movement. No night time symptoms. Also has presented some RLQ pressure like pain recently which does not radiate anywhere else. The patient denies having any nausea, vomiting, fever, hematochezia, melena, hematemesis, diarrhea, jaundice, pruritus or weight loss/gain.  Patient endorses that recently he has felt that even though he is eating more than usual, he does not feel full easily and has not been decreasing his eating habits.  Takes Protonix 40 mg qday for GERD intermittently.  States he has episodes of heartburn couple of times per month.  He reports very seldom loose stools but not diarrhea. He states that in the past (possibly 2 months ago) he had abdominal pain when he was moving his bowels, but this has resolved.  As part the evaluation of his abdominal pain, he underwent a CT of the abdomen and pelvis with IV contrast on 09/2020.  I reviewed these images,  there is no presence of any alterations explain his abdominal pain, organs looked normal.  There was only presence of small nonobstructive renal stones  Last FFM:BWGYK Last Colonoscopy:never  FHx: neg for any gastrointestinal/liver disease, no malignancies Social: neg smoking, alcohol or illicit drug use Surgical: cholecystectomy  Past Medical History: Past Medical History:  Diagnosis Date  . ADHD (attention deficit hyperactivity disorder)   . Anxiety   . Bipolar disorder ([place])   . Depression   . Diabetes mellitus   . GERD (gastroesophageal reflux disease)   . Headache   . Hemochromatosis 2019  . Hypertension   . Polycythemia   . Sleep apnea   . Thyroid disease     Past Surgical History: Past Surgical History:  Procedure Laterality Date  . CHOLECYSTECTOMY N/A 11/24/2018   Procedure: LAPAROSCOPIC CHOLECYSTECTOMY;  Surgeon: Aviva Signs, MD;  Location: [facility];  Service: General;  Laterality: N/A;  . CIRCUMCISION N/A 09/14/2017   Procedure: CIRCUMCISION;  Surgeon: Cleon Gustin, MD;  Location: [facility];  Service: Urology;  Laterality: N/A;  1 HR 336 [phone] - He has a Education officer, museum that needs to be called if changes made Mateo Flow @ [facility]  . HERNIA REPAIR     scrotum  . MAXILLARY ANTROSTOMY Right 08/18/2018   Procedure: ENDOSCOPIC MAXILLARY RIGHT ANTROSTOMY WITH TISSUE REMOVAL;  Surgeon: Leta Baptist, MD;  Location: [place];  Service: ENT;  Laterality: Right;  . NASAL SEPTOPLASTY W/ TURBINOPLASTY Bilateral 08/18/2018   Procedure: NASAL SEPTOPLASTY WITH TURBINATE REDUCTION;  Surgeon: Leta Baptist, MD;  Location: [place];  Service: ENT;  Laterality: Bilateral;  . NASAL SEPTUM SURGERY  08/18/2018  . ORIF SHOULDER FRACTURE Right 02/28/2016   Procedure: OPEN REDUCTION INTERNAL FIXATION (ORIF) SHOULDER FRACTURE;  Surgeon: Carole Civil, MD;  Location: [facility];  Service: Orthopedics;  Laterality: Right;  . SHOULDER CLOSED REDUCTION Right 12/04/2015    Procedure: CLOSED REDUCTION SHOULDER;  Surgeon: Carole Civil, MD;  Location: [facility];  Service: Orthopedics;  Laterality: Right;  . TOOTH EXTRACTION  spring 2016   top left     Family History: Family History  Problem Relation Age of Onset  . Diabetes Mother   . Diabetes Father     Social History: Social History   Tobacco Use  Smoking Status Former Smoker  . Packs/day: 0.50  . Years: 5.00  . Pack years: 2.50  . Types: E-cigarettes  . Quit date: 02/24/2009  . Years since quitting: 11.7  Smokeless Tobacco Never Used   Social History   Substance and Sexual Activity  Alcohol Use Not Currently   Comment: once year   Social History   Substance and Sexual Activity  Drug Use No    Allergies: Allergies  Allergen Reactions  . Methylphenidate Derivatives Other (See Comments)    Patient states it made him "act like a zombie" when given as a child    Medications: Current Outpatient Medications  Medication Sig Dispense Refill  . amLODipine (NORVASC) 10 MG tablet Take 1 tablet (10 mg total) by mouth daily. 30 tablet 1  . atorvastatin (LIPITOR) 10 MG tablet Take by mouth.    . blood glucose meter kit and supplies Dispense based on patient and insurance preference. Use up to four times daily as directed. (FOR ICD-10 E10.9, E11.9). 1 each 0  . chlorthalidone (HYGROTON) 50 MG tablet Take 50 mg by mouth daily.    . citalopram (CELEXA) 20 MG tablet Take 20 mg by mouth daily.     Marland Kitchen gemfibrozil (LOPID) 600 MG tablet Take 600 mg by mouth 2 (two) times daily.    . Insulin Glargine (BASAGLAR KWIKPEN) 100 UNIT/ML Inject 15 Units into the skin daily.    . Insulin Pen Needle (ABOUTTIME PEN NEEDLE) 31G X 8 MM MISC Use as needed to inject insulin daily as instructed 150 each 1  . JARDIANCE 10 MG TABS tablet Take 10 mg by mouth daily.    Marland Kitchen lisinopril (PRINIVIL,ZESTRIL) 40 MG tablet Take 1 tablet (40 mg total) by mouth daily. 30 tablet 1  . metFORMIN (GLUCOPHAGE) 500 MG tablet Take  1,000 mg by mouth 2 (two) times daily.    . ondansetron (ZOFRAN ODT) 4 MG disintegrating tablet Take 1 tablet (4 mg total) by mouth every 8 (eight) hours as needed for nausea or vomiting. 20 tablet 0  . ondansetron (ZOFRAN) 4 MG tablet Take 1 tablet (4 mg total) by mouth every 8 (eight) hours as needed. 8 tablet 0  . pantoprazole (PROTONIX) 40 MG tablet Take 1 tablet (40 mg total) by mouth daily. 15 tablet 1  . polyethylene glycol (MIRALAX) 17 g packet Take 17 g by mouth daily. (Patient taking differently: Take 17 g by mouth daily. As needed.) 30 each 0  . potassium chloride SA (KLOR-CON) 20 MEQ tablet Take 20 mEq by mouth daily.    Marland Kitchen dicyclomine (BENTYL) 20 MG tablet Take 1 tablet (20 mg total) by mouth 2 (two) times daily. (Patient not taking: Reported on 11/22/2020) 20 tablet 0  . Insulin Glargine (LANTUS) 100 UNIT/ML Solostar Pen Inject 10 Units  into the skin daily for 30 days. (Patient taking differently: No sig reported) 3 mL 0  . LORazepam (ATIVAN) 1 MG tablet Take 1 tablet (1 mg total) by mouth every 8 (eight) hours as needed for anxiety. (Patient not taking: Reported on 11/22/2020) 15 tablet 0  . metoCLOPramide (REGLAN) 10 MG tablet Take 1 tablet (10 mg total) by mouth every 6 (six) hours as needed for nausea. (Patient not taking: Reported on 11/22/2020) 30 tablet 0  . metoprolol succinate (TOPROL-XL) 100 MG 24 hr tablet TAKE 1 TABLET BY MOUTH2DAILY (Patient not taking: Reported on 11/22/2020)    . promethazine (PHENERGAN) 25 MG suppository Place 1 suppository (25 mg total) rectally every 6 (six) hours as needed for nausea or vomiting. (Patient not taking: Reported on 11/22/2020) 4 each 0   No current facility-administered medications for this visit.    Review of Systems: GENERAL: negative for malaise, night sweats HEENT: No changes in hearing or vision, no nose bleeds or other nasal problems. NECK: Negative for lumps, goiter, pain and significant neck swelling RESPIRATORY: Negative for  cough, wheezing CARDIOVASCULAR: Negative for chest pain, leg swelling, palpitations, orthopnea GI: SEE HPI MUSCULOSKELETAL: Negative for joint pain or swelling, back pain, and muscle pain. SKIN: Negative for lesions, rash PSYCH: Negative for sleep disturbance, mood disorder and recent psychosocial stressors. HEMATOLOGY Negative for prolonged bleeding, bruising easily, and swollen nodes. ENDOCRINE: Negative for cold or heat intolerance, polyuria, polydipsia and goiter. NEURO: negative for tremor, gait imbalance, syncope and seizures. The remainder of the review of systems is noncontributory.   Physical Exam: BP (!) 161/117 (BP Location: Left Arm, Patient Position: Sitting, Cuff Size: Large)   Pulse (!) 105   Temp 98.6 F (37 C) (Oral)   Ht _0  (1.803 m)   Wt (!) 310 lb (140.6 kg)   BMI 43.24 kg/m  GENERAL: The patient is AO x3, in no acute distress. Obese. HEENT: Head is normocephalic and atraumatic. EOMI are intact. Mouth is well hydrated and without lesions. NECK: Supple. No masses LUNGS: Clear to auscultation. No presence of rhonchi/wheezing/rales. Adequate chest expansion HEART: RRR, normal s1 and s2. ABDOMEN: mildly tender in epigastric area, R flank and RUQ, no guarding, no peritoneal signs, and nondistended. BS +. No masses. EXTREMITIES: Without any cyanosis, clubbing, rash, lesions or edema. NEUROLOGIC: AOx3, no focal motor deficit. SKIN: no jaundice, no rashes   Imaging/Labs: as above  I personally reviewed and interpreted the available labs, imaging and endoscopic files.  Impression and Plan: OPAL MCKELLIPS is a 35 y.o. male with PMH polycythemia, OSA, DM, HTN and obesity who presents for evaluation of abdominal pain.  The patient has presented chronic symptoms of abdominal pain of unclear etiology.  It was initially considered this was related to symptomatic biliary colic but his symptoms have not resolved after his cholecystectomy.  He has had cross-sectional  abdominal imaging that has been unremarkable.  It is very likely his symptoms are functional in nature, which I thoroughly discussed with the patient today.  We will need to evaluate any endoluminal alterations with an esophagogastroduodenospy which will be scheduled today.  The patient will be prescribed Levsin to take as needed as this may improve his symptoms, but also he would benefit from taking omeprazole on a daily basis for management of possible functional dyspepsia.  Patient understood and agreed.  Finally, he will need to follow-up with his PCP regarding his blood pressure control and medications to improve his satiety as he is eating more than  usual.  -Schedule EGD with SB biopsies -Start Levsin 1 tablet q8h as needed for abdominal pain -Restart omeprazole 40 mg qday -Follow up with PCP regarding blood pressure and medication to improve satiety  All questions were answered.      Kyle Peppers, MD Gastroenterology and Hepatology [facility]

## 2020-11-22 NOTE — H&P (View-Only) (Signed)
Maylon Peppers, M.D. Gastroenterology & Hepatology [facility] [address] [place], [place] [address] Primary Care Physician: Neale Burly, MD [place] [address]  Referring MD: PCP  Chief Complaint: Abdominal pain  History of Present Illness: Kyle Collins is a 35 y.o. male with PMH polycythemia, OSA, DM, HTN and obesity who presents for evaluation of abdominal pain.  Patient reports that for the last 3 years he has had intermittent episodes of RUQ pain that do not radiate to any other area. He had his gallbladder removed 2 years for this as it was thought to be secondary to symptomatic cholelithiasis. States that the pain used to radiate to his back in the past but after his surgery he had nonradiating endoscopic mucosal resection. However, he describes having persistent "soreness" in his abdominal wall, which is intermittent through the day but he feels it on a daily basis. There are no triggers for his abdominal pain, does not happen with any specific movement. No night time symptoms. Also has presented some RLQ pressure like pain recently which does not radiate anywhere else. The patient denies having any nausea, vomiting, fever, hematochezia, melena, hematemesis, diarrhea, jaundice, pruritus or weight loss/gain.  Patient endorses that recently he has felt that even though he is eating more than usual, he does not feel full easily and has not been decreasing his eating habits.  Takes Protonix 40 mg qday for GERD intermittently.  States he has episodes of heartburn couple of times per month.  He reports very seldom loose stools but not diarrhea. He states that in the past (possibly 2 months ago) he had abdominal pain when he was moving his bowels, but this has resolved.  As part the evaluation of his abdominal pain, he underwent a CT of the abdomen and pelvis with IV contrast on 09/2020.  I reviewed these images,  there is no presence of any alterations explain his abdominal pain, organs looked normal.  There was only presence of small nonobstructive renal stones  Last FFM:BWGYK Last Colonoscopy:never  FHx: neg for any gastrointestinal/liver disease, no malignancies Social: neg smoking, alcohol or illicit drug use Surgical: cholecystectomy  Past Medical History: Past Medical History:  Diagnosis Date  . ADHD (attention deficit hyperactivity disorder)   . Anxiety   . Bipolar disorder ([place])   . Depression   . Diabetes mellitus   . GERD (gastroesophageal reflux disease)   . Headache   . Hemochromatosis 2019  . Hypertension   . Polycythemia   . Sleep apnea   . Thyroid disease     Past Surgical History: Past Surgical History:  Procedure Laterality Date  . CHOLECYSTECTOMY N/A 11/24/2018   Procedure: LAPAROSCOPIC CHOLECYSTECTOMY;  Surgeon: Aviva Signs, MD;  Location: [facility];  Service: General;  Laterality: N/A;  . CIRCUMCISION N/A 09/14/2017   Procedure: CIRCUMCISION;  Surgeon: Cleon Gustin, MD;  Location: [facility];  Service: Urology;  Laterality: N/A;  1 HR 336 [phone] - He has a Education officer, museum that needs to be called if changes made Mateo Flow @ [place] [facility]  . HERNIA REPAIR     scrotum  . MAXILLARY ANTROSTOMY Right 08/18/2018   Procedure: ENDOSCOPIC MAXILLARY RIGHT ANTROSTOMY WITH TISSUE REMOVAL;  Surgeon: Leta Baptist, MD;  Location: [place];  Service: ENT;  Laterality: Right;  . NASAL SEPTOPLASTY W/ TURBINOPLASTY Bilateral 08/18/2018   Procedure: NASAL SEPTOPLASTY WITH TURBINATE REDUCTION;  Surgeon: Leta Baptist, MD;  Location: [place];  Service: ENT;  Laterality: Bilateral;  . NASAL SEPTUM SURGERY  08/18/2018  . ORIF SHOULDER FRACTURE Right 02/28/2016   Procedure: OPEN REDUCTION INTERNAL FIXATION (ORIF) SHOULDER FRACTURE;  Surgeon: Carole Civil, MD;  Location: [facility];  Service: Orthopedics;  Laterality: Right;  . SHOULDER CLOSED REDUCTION Right 12/04/2015    Procedure: CLOSED REDUCTION SHOULDER;  Surgeon: Carole Civil, MD;  Location: [facility];  Service: Orthopedics;  Laterality: Right;  . TOOTH EXTRACTION  spring 2016   top left     Family History: Family History  Problem Relation Age of Onset  . Diabetes Mother   . Diabetes Father     Social History: Social History   Tobacco Use  Smoking Status Former Smoker  . Packs/day: 0.50  . Years: 5.00  . Pack years: 2.50  . Types: E-cigarettes  . Quit date: 02/24/2009  . Years since quitting: 11.7  Smokeless Tobacco Never Used   Social History   Substance and Sexual Activity  Alcohol Use Not Currently   Comment: once year   Social History   Substance and Sexual Activity  Drug Use No    Allergies: Allergies  Allergen Reactions  . Methylphenidate Derivatives Other (See Comments)    Patient states it made him "act like a zombie" when given as a child    Medications: Current Outpatient Medications  Medication Sig Dispense Refill  . amLODipine (NORVASC) 10 MG tablet Take 1 tablet (10 mg total) by mouth daily. 30 tablet 1  . atorvastatin (LIPITOR) 10 MG tablet Take by mouth.    . blood glucose meter kit and supplies Dispense based on patient and insurance preference. Use up to four times daily as directed. (FOR ICD-10 E10.9, E11.9). 1 each 0  . chlorthalidone (HYGROTON) 50 MG tablet Take 50 mg by mouth daily.    . citalopram (CELEXA) 20 MG tablet Take 20 mg by mouth daily.     Marland Kitchen gemfibrozil (LOPID) 600 MG tablet Take 600 mg by mouth 2 (two) times daily.    . Insulin Glargine (BASAGLAR KWIKPEN) 100 UNIT/ML Inject 15 Units into the skin daily.    . Insulin Pen Needle (ABOUTTIME PEN NEEDLE) 31G X 8 MM MISC Use as needed to inject insulin daily as instructed 150 each 1  . JARDIANCE 10 MG TABS tablet Take 10 mg by mouth daily.    Marland Kitchen lisinopril (PRINIVIL,ZESTRIL) 40 MG tablet Take 1 tablet (40 mg total) by mouth daily. 30 tablet 1  . metFORMIN (GLUCOPHAGE) 500 MG tablet Take  1,000 mg by mouth 2 (two) times daily.    . ondansetron (ZOFRAN ODT) 4 MG disintegrating tablet Take 1 tablet (4 mg total) by mouth every 8 (eight) hours as needed for nausea or vomiting. 20 tablet 0  . ondansetron (ZOFRAN) 4 MG tablet Take 1 tablet (4 mg total) by mouth every 8 (eight) hours as needed. 8 tablet 0  . pantoprazole (PROTONIX) 40 MG tablet Take 1 tablet (40 mg total) by mouth daily. 15 tablet 1  . polyethylene glycol (MIRALAX) 17 g packet Take 17 g by mouth daily. (Patient taking differently: Take 17 g by mouth daily. As needed.) 30 each 0  . potassium chloride SA (KLOR-CON) 20 MEQ tablet Take 20 mEq by mouth daily.    Marland Kitchen dicyclomine (BENTYL) 20 MG tablet Take 1 tablet (20 mg total) by mouth 2 (two) times daily. (Patient not taking: Reported on 11/22/2020) 20 tablet 0  . Insulin Glargine (LANTUS) 100 UNIT/ML Solostar Pen Inject 10 Units  into the skin daily for 30 days. (Patient taking differently: No sig reported) 3 mL 0  . LORazepam (ATIVAN) 1 MG tablet Take 1 tablet (1 mg total) by mouth every 8 (eight) hours as needed for anxiety. (Patient not taking: Reported on 11/22/2020) 15 tablet 0  . metoCLOPramide (REGLAN) 10 MG tablet Take 1 tablet (10 mg total) by mouth every 6 (six) hours as needed for nausea. (Patient not taking: Reported on 11/22/2020) 30 tablet 0  . metoprolol succinate (TOPROL-XL) 100 MG 24 hr tablet TAKE 1 TABLET BY MOUTH2DAILY (Patient not taking: Reported on 11/22/2020)    . promethazine (PHENERGAN) 25 MG suppository Place 1 suppository (25 mg total) rectally every 6 (six) hours as needed for nausea or vomiting. (Patient not taking: Reported on 11/22/2020) 4 each 0   No current facility-administered medications for this visit.    Review of Systems: GENERAL: negative for malaise, night sweats HEENT: No changes in hearing or vision, no nose bleeds or other nasal problems. NECK: Negative for lumps, goiter, pain and significant neck swelling RESPIRATORY: Negative for  cough, wheezing CARDIOVASCULAR: Negative for chest pain, leg swelling, palpitations, orthopnea GI: SEE HPI MUSCULOSKELETAL: Negative for joint pain or swelling, back pain, and muscle pain. SKIN: Negative for lesions, rash PSYCH: Negative for sleep disturbance, mood disorder and recent psychosocial stressors. HEMATOLOGY Negative for prolonged bleeding, bruising easily, and swollen nodes. ENDOCRINE: Negative for cold or heat intolerance, polyuria, polydipsia and goiter. NEURO: negative for tremor, gait imbalance, syncope and seizures. The remainder of the review of systems is noncontributory.   Physical Exam: BP (!) 161/117 (BP Location: Left Arm, Patient Position: Sitting, Cuff Size: Large)   Pulse (!) 105   Temp 98.6 F (37 C) (Oral)   Ht _0  (1.803 m)   Wt (!) 310 lb (140.6 kg)   BMI 43.24 kg/m  GENERAL: The patient is AO x3, in no acute distress. Obese. HEENT: Head is normocephalic and atraumatic. EOMI are intact. Mouth is well hydrated and without lesions. NECK: Supple. No masses LUNGS: Clear to auscultation. No presence of rhonchi/wheezing/rales. Adequate chest expansion HEART: RRR, normal s1 and s2. ABDOMEN: mildly tender in epigastric area, R flank and RUQ, no guarding, no peritoneal signs, and nondistended. BS +. No masses. EXTREMITIES: Without any cyanosis, clubbing, rash, lesions or edema. NEUROLOGIC: AOx3, no focal motor deficit. SKIN: no jaundice, no rashes   Imaging/Labs: as above  I personally reviewed and interpreted the available labs, imaging and endoscopic files.  Impression and Plan: OPAL MCKELLIPS is a 35 y.o. male with PMH polycythemia, OSA, DM, HTN and obesity who presents for evaluation of abdominal pain.  The patient has presented chronic symptoms of abdominal pain of unclear etiology.  It was initially considered this was related to symptomatic biliary colic but his symptoms have not resolved after his cholecystectomy.  He has had cross-sectional  abdominal imaging that has been unremarkable.  It is very likely his symptoms are functional in nature, which I thoroughly discussed with the patient today.  We will need to evaluate any endoluminal alterations with an esophagogastroduodenospy which will be scheduled today.  The patient will be prescribed Levsin to take as needed as this may improve his symptoms, but also he would benefit from taking omeprazole on a daily basis for management of possible functional dyspepsia.  Patient understood and agreed.  Finally, he will need to follow-up with his PCP regarding his blood pressure control and medications to improve his satiety as he is eating more than  usual.  -Schedule EGD with SB biopsies -Start Levsin 1 tablet q8h as needed for abdominal pain -Restart omeprazole 40 mg qday -Follow up with PCP regarding blood pressure and medication to improve satiety  All questions were answered.      Maylon Peppers, MD Gastroenterology and Hepatology [facility]

## 2020-11-22 NOTE — Patient Instructions (Addendum)
Schedule EGD Start Levsin 1 tablet q8h as needed for abdominal pain Restart omeprazole 40 mg qday Follow up with PCP regarding blood pressure and medication to improve satiety

## 2020-11-22 NOTE — Telephone Encounter (Signed)
Spoke with Dr. Jenetta Downer he recommends FD guard.  Patient called back to report the hyoscyamine costs him $33.00 he can not afford. He states if the medication cost more that $3.00 he will not be able to afford.

## 2020-11-22 NOTE — Telephone Encounter (Signed)
Patient aware of all.

## 2020-12-04 ENCOUNTER — Other Ambulatory Visit: Payer: Self-pay

## 2020-12-04 ENCOUNTER — Ambulatory Visit (INDEPENDENT_AMBULATORY_CARE_PROVIDER_SITE_OTHER): Payer: Medicare Other | Admitting: Orthopaedic Surgery

## 2020-12-04 ENCOUNTER — Encounter: Payer: Self-pay | Admitting: Orthopaedic Surgery

## 2020-12-04 DIAGNOSIS — G8929 Other chronic pain: Secondary | ICD-10-CM | POA: Diagnosis not present

## 2020-12-04 DIAGNOSIS — M5441 Lumbago with sciatica, right side: Secondary | ICD-10-CM | POA: Diagnosis not present

## 2020-12-04 NOTE — Progress Notes (Signed)
My back is a little better.  He has little to no pain in the lower back today.  He has no right sided paresthesias he had earlier.    He had MRI of the lumbar spine which showed: IMPRESSION: Mild left L3-4 and bilateral L4-5 neural foraminal narrowing.  Left foraminal protrusions/annular fissuring at the L3-S1 levels. Abutment of the exiting left L4 nerve root.   His pain is on the right side, not the left.  Encounter Diagnosis  Name Primary?  . Chronic right-sided low back pain with right-sided sciatica Yes   As he is better, I will hold off any further evaluation for now.  He may need to see neurosurgeon.  I have independently reviewed the MRI.     Return in one month.  Call if any problem.  Precautions discussed.   Electronically Signed Sanjuana Kava, MD 5/10/20222:19 PM

## 2020-12-10 NOTE — Patient Instructions (Signed)
Kyle Collins  12/10/2020     @PREFPERIOPPHARMACY @   Your procedure is scheduled on  12/14/2020.   Report to [name] at  0800  A.M.   Call this number if you have problems the morning of surgery:  [phone]   Remember:  Follow the diet instructions given to you by the office.                     Take these medicines the morning of surgery with A SIP OF WATER  Amlodipine, reglan, metoprolol.  DO NOT take any medications for diabetes the morning of your procedure.  If your glucose is 70 or below the morning of your procedure, drink 1/2 cup of clear liquid containing sugar and recheck your glucose in 15 minutes. If your glucose is still 70 or below, call [phone] for instructions.  If your glucose is 300 or above the morning of your procedure, call [phone] for instructions.     Please brush your teeth.  Do not wear jewelry, make-up or nail polish.  Do not wear lotions, powders, or perfumes, or deodorant.  Do not shave 48 hours prior to surgery.  Men may shave face and neck.  Do not bring valuables to the hospital.  [facility] is not responsible for any belongings or valuables.  Contacts, dentures or bridgework may not be worn into surgery.  Leave your suitcase in the car.  After surgery it may be brought to your room.  For patients admitted to the hospital, discharge time will be determined by your treatment team.  Patients discharged the day of surgery will not be allowed to drive home and must  Have someone with them for 24 hours.   Special instructions:  DO NOT smoke tobacco or vape for 24 hours before your procedure.  Please read over the following fact sheets that you were given. Anesthesia Post-op Instructions and Care and Recovery After Surgery       Upper Endoscopy, Adult, Care After This sheet gives you information about how to care for yourself after your procedure. Your health care provider may also give you more specific  instructions. If you have problems or questions, contact your health care provider. What can I expect after the procedure? After the procedure, it is common to have:  A sore throat.  Mild stomach pain or discomfort.  Bloating.  Nausea. Follow these instructions at home:  Follow instructions from your health care provider about what to eat or drink after your procedure.  Return to your normal activities as told by your health care provider. Ask your health care provider what activities are safe for you.  Take over-the-counter and prescription medicines only as told by your health care provider.  If you were given a sedative during the procedure, it can affect you for several hours. Do not drive or operate machinery until your health care provider says that it is safe.  Keep all follow-up visits as told by your health care provider. This is important.   Contact a health care provider if you have:  A sore throat that lasts longer than one day.  Trouble swallowing. Get help right away if:  You vomit blood or your vomit looks like coffee grounds.  You have: ? A fever. ? Bloody, black, or tarry stools. ? A severe sore throat or you cannot swallow. ? Difficulty breathing. ? Severe pain in your chest or abdomen. Summary  After the procedure,  it is common to have a sore throat, mild stomach discomfort, bloating, and nausea.  If you were given a sedative during the procedure, it can affect you for several hours. Do not drive or operate machinery until your health care provider says that it is safe.  Follow instructions from your health care provider about what to eat or drink after your procedure.  Return to your normal activities as told by your health care provider. This information is not intended to replace advice given to you by your health care provider. Make sure you discuss any questions you have with your health care provider. Document Revised: 07/12/2019 Document  Reviewed: 12/14/2017 Elsevier Patient Education  2021 [place] After This sheet gives you information about how to care for yourself after your procedure. Your health care provider may also give you more specific instructions. If you have problems or questions, contact your health care provider. What can I expect after the procedure? After the procedure, it is common to have:  Tiredness.  Forgetfulness about what happened after the procedure.  Impaired judgment for important decisions.  Nausea or vomiting.  Some difficulty with balance. Follow these instructions at home: For the time period you were told by your health care provider:  Rest as needed.  Do not participate in activities where you could fall or become injured.  Do not drive or use machinery.  Do not drink alcohol.  Do not take sleeping pills or medicines that cause drowsiness.  Do not make important decisions or sign legal documents.  Do not take care of children on your own.      Eating and drinking  Follow the diet that is recommended by your health care provider.  Drink enough fluid to keep your urine pale yellow.  If you vomit: ? Drink water, juice, or soup when you can drink without vomiting. ? Make sure you have little or no nausea before eating solid foods. General instructions  Have a responsible adult stay with you for the time you are told. It is important to have someone help care for you until you are awake and alert.  Take over-the-counter and prescription medicines only as told by your health care provider.  If you have sleep apnea, surgery and certain medicines can increase your risk for breathing problems. Follow instructions from your health care provider about wearing your sleep device: ? Anytime you are sleeping, including during daytime naps. ? While taking prescription pain medicines, sleeping medicines, or medicines that make you  drowsy.  Avoid smoking.  Keep all follow-up visits as told by your health care provider. This is important. Contact a health care provider if:  You keep feeling nauseous or you keep vomiting.  You feel light-headed.  You are still sleepy or having trouble with balance after 24 hours.  You develop a rash.  You have a fever.  You have redness or swelling around the IV site. Get help right away if:  You have trouble breathing.  You have new-onset confusion at home. Summary  For several hours after your procedure, you may feel tired. You may also be forgetful and have poor judgment.  Have a responsible adult stay with you for the time you are told. It is important to have someone help care for you until you are awake and alert.  Rest as told. Do not drive or operate machinery. Do not drink alcohol or take sleeping pills.  Get help right away if you have  trouble breathing, or if you suddenly become confused. This information is not intended to replace advice given to you by your health care provider. Make sure you discuss any questions you have with your health care provider. Document Revised: 03/29/2020 Document Reviewed: 06/16/2019 Elsevier Patient Education  2021 [employer].

## 2020-12-12 ENCOUNTER — Other Ambulatory Visit (HOSPITAL_COMMUNITY)
Admission: RE | Admit: 2020-12-12 | Discharge: 2020-12-12 | Disposition: A | Discharge status: Home / Self Care | Payer: Medicare Other | Source: Ambulatory Visit | Attending: Gastroenterology | Admitting: Gastroenterology

## 2020-12-12 ENCOUNTER — Encounter (HOSPITAL_COMMUNITY)
Admission: RE | Admit: 2020-12-12 | Discharge: 2020-12-12 | Disposition: A | Discharge status: Home / Self Care | Payer: Medicare Other | Source: Ambulatory Visit | Attending: Gastroenterology | Admitting: Gastroenterology

## 2020-12-12 ENCOUNTER — Other Ambulatory Visit: Payer: Self-pay

## 2020-12-12 ENCOUNTER — Encounter (HOSPITAL_COMMUNITY): Payer: Self-pay

## 2020-12-12 DIAGNOSIS — Z20822 Contact with and (suspected) exposure to covid-19: Secondary | ICD-10-CM | POA: Diagnosis not present

## 2020-12-12 DIAGNOSIS — Z01812 Encounter for preprocedural laboratory examination: Secondary | ICD-10-CM | POA: Insufficient documentation

## 2020-12-12 LAB — SARS CORONAVIRUS 2 (TAT 6-24 HRS): SARS Coronavirus 2: NEGATIVE

## 2020-12-14 ENCOUNTER — Encounter (HOSPITAL_COMMUNITY)
Admission: RE | Disposition: A | Discharge status: Home / Self Care | Payer: Self-pay | Source: Home / Self Care | Attending: Gastroenterology

## 2020-12-14 ENCOUNTER — Ambulatory Visit (HOSPITAL_COMMUNITY)
Admission: RE | Admit: 2020-12-14 | Discharge: 2020-12-14 | Disposition: A | Discharge status: Home / Self Care | Payer: Medicare Other | Attending: Gastroenterology | Admitting: Gastroenterology

## 2020-12-14 ENCOUNTER — Ambulatory Visit (HOSPITAL_COMMUNITY): Payer: Medicare Other | Admitting: Anesthesiology

## 2020-12-14 ENCOUNTER — Encounter (HOSPITAL_COMMUNITY): Payer: Self-pay | Admitting: Gastroenterology

## 2020-12-14 ENCOUNTER — Other Ambulatory Visit: Payer: Self-pay

## 2020-12-14 DIAGNOSIS — K2289 Other specified disease of esophagus: Secondary | ICD-10-CM | POA: Diagnosis not present

## 2020-12-14 DIAGNOSIS — R1011 Right upper quadrant pain: Secondary | ICD-10-CM | POA: Insufficient documentation

## 2020-12-14 DIAGNOSIS — E669 Obesity, unspecified: Secondary | ICD-10-CM | POA: Diagnosis not present

## 2020-12-14 DIAGNOSIS — Z79899 Other long term (current) drug therapy: Secondary | ICD-10-CM | POA: Diagnosis not present

## 2020-12-14 DIAGNOSIS — E1165 Type 2 diabetes mellitus with hyperglycemia: Secondary | ICD-10-CM | POA: Diagnosis not present

## 2020-12-14 DIAGNOSIS — Z87891 Personal history of nicotine dependence: Secondary | ICD-10-CM | POA: Diagnosis not present

## 2020-12-14 DIAGNOSIS — Z794 Long term (current) use of insulin: Secondary | ICD-10-CM | POA: Diagnosis not present

## 2020-12-14 DIAGNOSIS — R109 Unspecified abdominal pain: Secondary | ICD-10-CM | POA: Diagnosis not present

## 2020-12-14 DIAGNOSIS — Z888 Allergy status to other drugs, medicaments and biological substances status: Secondary | ICD-10-CM | POA: Insufficient documentation

## 2020-12-14 DIAGNOSIS — K219 Gastro-esophageal reflux disease without esophagitis: Secondary | ICD-10-CM | POA: Insufficient documentation

## 2020-12-14 DIAGNOSIS — Z7984 Long term (current) use of oral hypoglycemic drugs: Secondary | ICD-10-CM | POA: Insufficient documentation

## 2020-12-14 DIAGNOSIS — K269 Duodenal ulcer, unspecified as acute or chronic, without hemorrhage or perforation: Secondary | ICD-10-CM

## 2020-12-14 DIAGNOSIS — Z6841 Body Mass Index (BMI) 40.0 and over, adult: Secondary | ICD-10-CM | POA: Insufficient documentation

## 2020-12-14 HISTORY — PX: ESOPHAGOGASTRODUODENOSCOPY (EGD) WITH PROPOFOL: SHX5813

## 2020-12-14 HISTORY — PX: BIOPSY: SHX5522

## 2020-12-14 LAB — GLUCOSE, CAPILLARY: Glucose-Capillary: 221 mg/dL — ABNORMAL HIGH (ref 70–99)

## 2020-12-14 SURGERY — ESOPHAGOGASTRODUODENOSCOPY (EGD) WITH PROPOFOL
Anesthesia: General

## 2020-12-14 MED ORDER — PROPOFOL 500 MG/50ML IV EMUL
INTRAVENOUS | Status: DC | PRN
  Administered 2020-12-14: 150 ug/kg/min via INTRAVENOUS

## 2020-12-14 MED ORDER — MIDAZOLAM HCL 2 MG/2ML IJ SOLN
INTRAMUSCULAR | Status: AC
  Filled 2020-12-14: qty 2

## 2020-12-14 MED ORDER — PROPOFOL 10 MG/ML IV BOLUS
INTRAVENOUS | Status: DC | PRN
  Administered 2020-12-14: 100 mg via INTRAVENOUS

## 2020-12-14 MED ORDER — LIDOCAINE HCL (CARDIAC) PF 100 MG/5ML IV SOSY
PREFILLED_SYRINGE | INTRAVENOUS | Status: DC | PRN
  Administered 2020-12-14: 50 mg via INTRAVENOUS

## 2020-12-14 MED ORDER — LIDOCAINE HCL (PF) 2 % IJ SOLN
INTRAMUSCULAR | Status: AC
  Filled 2020-12-14: qty 5

## 2020-12-14 MED ORDER — STERILE WATER FOR IRRIGATION IR SOLN
Status: DC | PRN
  Administered 2020-12-14: 100 mL

## 2020-12-14 MED ORDER — LACTATED RINGERS IV SOLN: INTRAVENOUS | Status: DC

## 2020-12-14 MED ORDER — PROPOFOL 10 MG/ML IV BOLUS
INTRAVENOUS | Status: AC
  Filled 2020-12-14: qty 40

## 2020-12-14 NOTE — Discharge Instructions (Signed)
You are being discharged to home.  Resume your previous diet.  We are waiting for your pathology results.    Upper Endoscopy, Adult, Care After This sheet gives you information about how to care for yourself after your procedure. Your health care provider may also give you more specific instructions. If you have problems or questions, contact your health care provider. What can I expect after the procedure? After the procedure, it is common to have:  A sore throat.  Mild stomach pain or discomfort.  Bloating.  Nausea. Follow these instructions at home:  Follow instructions from your health care provider about what to eat or drink after your procedure.  Return to your normal activities as told by your health care provider. Ask your health care provider what activities are safe for you.  Take over-the-counter and prescription medicines only as told by your health care provider.  If you were given a sedative during the procedure, it can affect you for several hours. Do not drive or operate machinery until your health care provider says that it is safe.  Keep all follow-up visits as told by your health care provider. This is important.   Contact a health care provider if you have:  A sore throat that lasts longer than one day.  Trouble swallowing. Get help right away if:  You vomit blood or your vomit looks like coffee grounds.  You have: ? A fever. ? Bloody, black, or tarry stools. ? A severe sore throat or you cannot swallow. ? Difficulty breathing. ? Severe pain in your chest or abdomen. Summary  After the procedure, it is common to have a sore throat, mild stomach discomfort, bloating, and nausea.  If you were given a sedative during the procedure, it can affect you for several hours. Do not drive or operate machinery until your health care provider says that it is safe.  Follow instructions from your health care provider about what to eat or drink after your  procedure.  Return to your normal activities as told by your health care provider. This information is not intended to replace advice given to you by your health care provider. Make sure you discuss any questions you have with your health care provider. Document Revised: 07/12/2019 Document Reviewed: 12/14/2017 Elsevier Patient Education  2021 [employer].

## 2020-12-14 NOTE — Transfer of Care (Signed)
Immediate Anesthesia Transfer of Care Note  Patient: Kyle Collins  Procedure(s) Performed: ESOPHAGOGASTRODUODENOSCOPY (EGD) WITH PROPOFOL (N/A ) BIOPSY  Patient Location: PACU  Anesthesia Type:General  Level of Consciousness: drowsy  Airway & Oxygen Therapy: Patient Spontanous Breathing  Post-op Assessment: Report given to RN and Post -op Vital signs reviewed and stable  Post vital signs: Reviewed and stable  Last Vitals:  Vitals Value Taken Time  BP 107/67   Temp    Pulse    Resp    SpO2 97     Last Pain:  Vitals:   12/14/20 1012  TempSrc:   PainSc: 4       Patients Stated Pain Goal: 8 (32/44/01 0272)  Complications: No complications documented.

## 2020-12-14 NOTE — Anesthesia Postprocedure Evaluation (Signed)
Anesthesia Post Note  Patient: Kyle Collins  Procedure(s) Performed: ESOPHAGOGASTRODUODENOSCOPY (EGD) WITH PROPOFOL (N/A ) BIOPSY  Patient location during evaluation: Phase II Anesthesia Type: General Level of consciousness: awake Pain management: pain level controlled Vital Signs Assessment: post-procedure vital signs reviewed and stable Respiratory status: spontaneous breathing and respiratory function stable Cardiovascular status: blood pressure returned to baseline and stable Postop Assessment: no headache and no apparent nausea or vomiting Anesthetic complications: no Comments: Late entry   No complications documented.   Last Vitals:  Vitals:   12/14/20 0915 12/14/20 1029  BP: (!) 157/101 107/67  Pulse: 78 73  Resp: 12 18  Temp:  36.6 C  SpO2: 98% 97%    Last Pain:  Vitals:   12/14/20 1029  TempSrc: Axillary  PainSc: 0-No pain                 Louann Sjogren

## 2020-12-14 NOTE — Op Note (Signed)
[facility] Patient Name: Kyle Collins Procedure Date: 12/14/2020 10:06 AM MRN: [id number] Date of Birth: [birth year] Attending MD: Maylon Peppers ,  CSN: [id number] Age: 35 Admit Type: Outpatient Procedure:                Upper GI endoscopy Indications:              Abdominal pain in the right upper quadrant Providers:                Maylon Peppers, Janeece Riggers, RN, Nelma Rothman,                            Technician Referring MD:              Medicines:                Monitored Anesthesia Care Complications:            No immediate complications. Estimated Blood Loss:     Estimated blood loss: none. Procedure:                Pre-Anesthesia Assessment:                           - Prior to the procedure, a History and Physical                            was performed, and patient medications, allergies                            and sensitivities were reviewed. The patient's                            tolerance of previous anesthesia was reviewed.                           - The risks and benefits of the procedure and the                            sedation options and risks were discussed with the                            patient. All questions were answered and informed                            consent was obtained.                           - ASA Grade Assessment: II - A patient with mild                            systemic disease.                           After obtaining informed consent, the endoscope was                            passed under direct vision. Throughout the  procedure, the patient's blood pressure, pulse, and                            oxygen saturations were monitored continuously. The                            [id number]-H190 ([id number]) scope was introduced through the                            mouth, and advanced to the second part of duodenum.                            The upper GI endoscopy was accomplished without                             difficulty. The patient tolerated the procedure                            well. Scope In: 10:17:36 AM Scope Out: 10:23:13 AM Total Procedure Duration: 0 hours 5 minutes 37 seconds  Findings:      The examined esophagus was normal.      The Z-line was irregular.      The entire examined stomach was normal. Biopsies were taken with a cold       forceps for Helicobacter pylori testing.      Two localized erosions without bleeding were found in the first portion       of the duodenum. Biopsies for histology were taken from the normal       duodenum with a cold forceps for evaluation of celiac disease. Impression:               - Normal esophagus.                           - Z-line irregular.                           - Normal stomach. Biopsied.                           - Duodenal erosions without bleeding. Normal                            duodenum biopsied. Moderate Sedation:      Per Anesthesia Care Recommendation:           - Discharge patient to home (ambulatory).                           - Resume previous diet.                           - Await pathology results. Procedure Code(s):        --- Professional ---                           [phone], Esophagogastroduodenoscopy, flexible,  transoral; with biopsy, single or multiple Diagnosis Code(s):        --- Professional ---                           K22.8, Other specified diseases of esophagus                           K26.9, Duodenal ulcer, unspecified as acute or                            chronic, without hemorrhage or perforation                           R10.11, Right upper quadrant pain CPT copyright 2019 American Medical Association. All rights reserved. The codes documented in this report are preliminary and upon coder review may  be revised to meet current compliance requirements. Maylon Peppers, MD Maylon Peppers,  12/14/2020 10:29:10 AM This report has been signed  electronically. Number of Addenda: 0

## 2020-12-14 NOTE — Anesthesia Preprocedure Evaluation (Signed)
Anesthesia Evaluation  Patient identified by MRN, date of birth, ID band Patient awake    Reviewed: Allergy & Precautions, H&P , NPO status , Patient's Chart, lab work & pertinent test results, reviewed documented beta blocker date and time   Airway Mallampati: II  TM Distance: >3 FB Neck ROM: full    Dental no notable dental hx.    Pulmonary sleep apnea , former smoker,    Pulmonary exam normal breath sounds clear to auscultation       Cardiovascular Exercise Tolerance: Good hypertension, negative cardio ROS   Rhythm:regular Rate:Normal     Neuro/Psych  Headaches, PSYCHIATRIC DISORDERS Anxiety Depression Bipolar Disorder    GI/Hepatic Neg liver ROS, GERD  Medicated,  Endo/Other  negative endocrine ROSdiabetes, Poorly Controlled, Type 2  Renal/GU negative Renal ROS  negative genitourinary   Musculoskeletal   Abdominal   Peds  Hematology negative hematology ROS (+)   Anesthesia Other Findings   Reproductive/Obstetrics negative OB ROS                             Anesthesia Physical Anesthesia Plan  ASA: III  Anesthesia Plan: General   Post-op Pain Management:    Induction:   PONV Risk Score and Plan: Propofol infusion  Airway Management Planned:   Additional Equipment:   Intra-op Plan:   Post-operative Plan:   Informed Consent: I have reviewed the patients History and Physical, chart, labs and discussed the procedure including the risks, benefits and alternatives for the proposed anesthesia with the patient or authorized representative who has indicated his/her understanding and acceptance.     Dental Advisory Given  Plan Discussed with: CRNA  Anesthesia Plan Comments:         Anesthesia Quick Evaluation

## 2020-12-14 NOTE — Interval H&P Note (Signed)
History and Physical Interval Note:  12/14/2020 8:51 AM Kyle Collins is a 35 y.o. male with PMH polycythemia, OSA, DM, HTN and obesity who presents for evaluation of abdominal pain.  States he has presented intermittent episodes of right upper quadrant pain despite undergoing a cholecystectomy in the past.  Denies any nausea, vomiting, fever, chills, melena, hematochezia or weight loss.  BP (!) 159/116   Pulse 100   Temp 98.9 F (37.2 C) (Oral)   Resp 13   Ht 5\' 11"  (1.803 m)   Wt (!) 140.6 kg   SpO2 96%   BMI 43.24 kg/m   GENERAL: The patient is AO x3, in no acute distress. Obese. HEENT: Head is normocephalic and atraumatic. EOMI are intact. Mouth is well hydrated and without lesions. NECK: Supple. No masses LUNGS: Clear to auscultation. No presence of rhonchi/wheezing/rales. Adequate chest expansion HEART: RRR, normal s1 and s2. ABDOMEN: Soft, nontender, no guarding, no peritoneal signs, and nondistended. BS +. No masses. EXTREMITIES: Without any cyanosis, clubbing, rash, lesions or edema. NEUROLOGIC: AOx3, no focal motor deficit. SKIN: no jaundice, no rashes  TOREN TUCHOLSKI  has presented today for surgery, with the diagnosis of Abdominal Pain.  The various methods of treatment have been discussed with the patient and family. After consideration of risks, benefits and other options for treatment, the patient has consented to  Procedure(s) with comments: ESOPHAGOGASTRODUODENOSCOPY (EGD) WITH PROPOFOL (N/A) - 10:00 as a surgical intervention.  The patient's history has been reviewed, patient examined, no change in status, stable for surgery.  I have reviewed the patient's chart and labs.  Questions were answered to the patient's satisfaction.     Maylon Peppers [place]

## 2020-12-17 LAB — SURGICAL PATHOLOGY

## 2020-12-21 ENCOUNTER — Encounter (HOSPITAL_COMMUNITY): Payer: Self-pay | Admitting: Gastroenterology

## 2021-01-01 ENCOUNTER — Ambulatory Visit (INDEPENDENT_AMBULATORY_CARE_PROVIDER_SITE_OTHER): Payer: Medicare Other | Admitting: Orthopaedic Surgery

## 2021-01-01 ENCOUNTER — Encounter: Payer: Self-pay | Admitting: Orthopaedic Surgery

## 2021-01-01 ENCOUNTER — Other Ambulatory Visit: Payer: Self-pay

## 2021-01-01 VITALS — BP 150/103 | HR 95 | Ht 71.0 in | Wt 320.0 lb

## 2021-01-01 DIAGNOSIS — Z6841 Body Mass Index (BMI) 40.0 and over, adult: Secondary | ICD-10-CM

## 2021-01-01 DIAGNOSIS — G8929 Other chronic pain: Secondary | ICD-10-CM | POA: Diagnosis not present

## 2021-01-01 DIAGNOSIS — M5441 Lumbago with sciatica, right side: Secondary | ICD-10-CM

## 2021-01-01 NOTE — Progress Notes (Signed)
I do not hurt.  His back pain has resolved.  He has no pain.  He is active.  MRI had shown L4 nerve root abutment.  His pain was on right sight.    NV intact. ROM full.  He is doing well.  Gait normal.  Encounter Diagnoses  Name Primary?  . Chronic right-sided low back pain with right-sided sciatica Yes  . Body mass index 40.0-44.9, adult ([place])   . Morbid obesity ([place])    I will see as needed.  Call if any problem.  Precautions discussed.   Electronically Signed Sanjuana Kava, MD 6/7/20221:59 PM

## 2021-01-07 DIAGNOSIS — J309 Allergic rhinitis, unspecified: Secondary | ICD-10-CM | POA: Diagnosis not present

## 2021-01-07 DIAGNOSIS — K219 Gastro-esophageal reflux disease without esophagitis: Secondary | ICD-10-CM | POA: Diagnosis not present

## 2021-01-07 DIAGNOSIS — I10 Essential (primary) hypertension: Secondary | ICD-10-CM | POA: Diagnosis not present

## 2021-01-07 DIAGNOSIS — E1142 Type 2 diabetes mellitus with diabetic polyneuropathy: Secondary | ICD-10-CM | POA: Diagnosis not present

## 2021-01-07 DIAGNOSIS — G4730 Sleep apnea, unspecified: Secondary | ICD-10-CM | POA: Diagnosis not present

## 2021-01-08 DIAGNOSIS — R7989 Other specified abnormal findings of blood chemistry: Secondary | ICD-10-CM | POA: Diagnosis not present

## 2021-01-08 DIAGNOSIS — D751 Secondary polycythemia: Secondary | ICD-10-CM | POA: Diagnosis not present

## 2021-01-08 DIAGNOSIS — Z7189 Other specified counseling: Secondary | ICD-10-CM | POA: Diagnosis not present

## 2021-01-09 DIAGNOSIS — D751 Secondary polycythemia: Secondary | ICD-10-CM | POA: Diagnosis not present

## 2021-01-09 DIAGNOSIS — Z79899 Other long term (current) drug therapy: Secondary | ICD-10-CM | POA: Diagnosis not present

## 2021-02-25 DIAGNOSIS — D751 Secondary polycythemia: Secondary | ICD-10-CM | POA: Diagnosis not present

## 2021-02-28 ENCOUNTER — Other Ambulatory Visit: Payer: Self-pay

## 2021-02-28 ENCOUNTER — Ambulatory Visit (INDEPENDENT_AMBULATORY_CARE_PROVIDER_SITE_OTHER): Payer: Medicare Other | Admitting: Gastroenterology

## 2021-02-28 ENCOUNTER — Other Ambulatory Visit (INDEPENDENT_AMBULATORY_CARE_PROVIDER_SITE_OTHER): Payer: Self-pay

## 2021-02-28 ENCOUNTER — Encounter (INDEPENDENT_AMBULATORY_CARE_PROVIDER_SITE_OTHER): Payer: Self-pay | Admitting: Gastroenterology

## 2021-02-28 VITALS — BP 174/118 | HR 102 | Temp 99.1°F | Ht 71.0 in | Wt 331.0 lb

## 2021-02-28 DIAGNOSIS — R1011 Right upper quadrant pain: Secondary | ICD-10-CM

## 2021-02-28 DIAGNOSIS — G8929 Other chronic pain: Secondary | ICD-10-CM

## 2021-02-28 DIAGNOSIS — K219 Gastro-esophageal reflux disease without esophagitis: Secondary | ICD-10-CM | POA: Diagnosis not present

## 2021-02-28 MED ORDER — OMEPRAZOLE 40 MG PO CPDR: 40.0000 mg | DELAYED_RELEASE_CAPSULE | Freq: Every day | ORAL | 3 refills | Status: AC

## 2021-02-28 MED ORDER — ESOMEPRAZOLE MAGNESIUM 40 MG PO CPDR: 40.0000 mg | DELAYED_RELEASE_CAPSULE | Freq: Every day | ORAL | 3 refills | Status: DC

## 2021-02-28 NOTE — Progress Notes (Signed)
Referring Provider: Neale Burly, MD Primary Care Physician:  Neale Burly, MD Primary GI Physician: Jenetta Downer  Chief Complaint  Patient presents with   Follow-up    3 month follow up on EGD. Pt has occasional heartburn, gas and bloating. Has one to two stools a day and eats 2 large meals a day.    HPI:   Kyle Collins is a 35 y.o. male with PMH of GERD, DM, hemochromatosis, HTN, polycythemia, sleep apnea, thyroid disease, adhd, obesity, and bipolar, depression. Patient presents today to follow up on GERD and RLQ/RUQ abdominal pain. Recent EGD 12/14/20  GERD: no current PPI therapy at this time. States that he has reflux symptoms occasionally, especially when he eats certain foods. Occasional gas and bloating as well. Usually related to greasy/spicy foods. Does endorse regurgitation of acid at times, denies nausea or vomiting. Has taken nexium in the past with good result.   Abdominal Pain: Patient was having recurrent RUQ pain over the past 3 years despite previous cholecystectomy. Denies any issues with abdominal pain since EGD. 1-2 BMs per day. Reports they are large but he denies constipation.   Takes Excedrin maybe twice per week for migraines. Has tried other medications without relief. Denies tobacco or etoh use.   Patient states he is having difficulty with current CPAP, awaiting updated sleep study to get a new mask that works better.   Denies constipation or diarrhea, melena, hematochezia, no dysphagia or odynophagia.   Last Colonoscopy:no previous colonoscopy Last Endoscopy: (12/14/20) Normal esophagus. - Z-line irregular. Normal stomach. Biopsied (gastric antral and oxyntic mucosa w/o histopathologic changes) - Duodenal erosions without bleeding. Normal duodenum (small intestinal mucosa w/o histopathologic changes) Neg H pylori. CT abd pelvis w contrast: 09/28/20: no acute abnormality, 2 small non obstructing stones in each kidney.  Recommendations:  Screening  colonoscopy at age 51 No repeat EGD unless clinically warranted  Past Medical History:  Diagnosis Date   ADHD (attention deficit hyperactivity disorder)    Anxiety    Bipolar disorder ([place])    Depression    Diabetes mellitus    GERD (gastroesophageal reflux disease)    Headache    Hemochromatosis 2019   Hypertension    Polycythemia    Sleep apnea    Thyroid disease     Past Surgical History:  Procedure Laterality Date   BIOPSY  12/14/2020   Procedure: BIOPSY;  Surgeon: Harvel Quale, MD;  [facility];  Service: Gastroenterology;;   CHOLECYSTECTOMY N/A 11/24/2018   Procedure: LAPAROSCOPIC CHOLECYSTECTOMY;  Surgeon: Aviva Signs, MD;  Location: [facility];  Service: General;  Laterality: N/A;   CIRCUMCISION N/A 09/14/2017   Procedure: CIRCUMCISION;  Surgeon: Cleon Gustin, MD;  [facility]: [facility];  Service: Urology;  Laterality: N/A;  1 HR 336 [phone] - He has a Education officer, museum that needs to be called if changes made Mateo Flow @ [place] MEDICAID-[id number] N   ESOPHAGOGASTRODUODENOSCOPY (EGD) WITH PROPOFOL N/A 12/14/2020   Procedure: ESOPHAGOGASTRODUODENOSCOPY (EGD) WITH PROPOFOL;  Surgeon: Harvel Quale, MD;  [facility];  Service: Gastroenterology;  Laterality: N/A;  10:00   HERNIA REPAIR     scrotum   MAXILLARY ANTROSTOMY Right 08/18/2018   Procedure: ENDOSCOPIC MAXILLARY RIGHT ANTROSTOMY WITH TISSUE REMOVAL;  Surgeon: Leta Baptist, MD;  Location: [place];  Service: ENT;  Laterality: Right;   NASAL SEPTOPLASTY W/ TURBINOPLASTY Bilateral 08/18/2018   Procedure: NASAL SEPTOPLASTY WITH TURBINATE REDUCTION;  Surgeon: Leta Baptist, MD;  Location: [place];  Service: ENT;  Laterality: Bilateral;   NASAL SEPTUM SURGERY  08/18/2018   ORIF SHOULDER FRACTURE Right 02/28/2016   Procedure: OPEN REDUCTION INTERNAL FIXATION (ORIF) SHOULDER FRACTURE;  Surgeon: Carole Civil, MD;  [facility];  Service: Orthopedics;  Laterality: Right;    SHOULDER CLOSED REDUCTION Right 12/04/2015   Procedure: CLOSED REDUCTION SHOULDER;  Surgeon: Carole Civil, MD;  [facility];  Service: Orthopedics;  Laterality: Right;   TOOTH EXTRACTION  spring 2016   top left     Current Outpatient Medications  Medication Sig Dispense Refill   amLODipine (NORVASC) 10 MG tablet Take 1 tablet (10 mg total) by mouth daily. 30 tablet 1   aspirin-acetaminophen-caffeine (EXCEDRIN MIGRAINE) 517-616-07 MG tablet Take 2 tablets by mouth every 6 (six) hours as needed for headache.     atorvastatin (LIPITOR) 10 MG tablet Take by mouth.     blood glucose meter kit and supplies Dispense based on patient and insurance preference. Use up to four times daily as directed. (FOR ICD-10 E10.9, E11.9). 1 each 0   chlorthalidone (HYGROTON) 50 MG tablet Take 50 mg by mouth daily.     dicyclomine (BENTYL) 20 MG tablet Take 20 mg by mouth daily.     gemfibrozil (LOPID) 600 MG tablet Take 600 mg by mouth 2 (two) times daily.     Insulin Glargine (BASAGLAR KWIKPEN) 100 UNIT/ML Inject 15 Units into the skin daily.     Insulin Pen Needle (ABOUTTIME PEN NEEDLE) 31G X 8 MM MISC Use as needed to inject insulin daily as instructed 150 each 1   JARDIANCE 10 MG TABS tablet Take 10 mg by mouth daily.     lisinopril (PRINIVIL,ZESTRIL) 40 MG tablet Take 1 tablet (40 mg total) by mouth daily. 30 tablet 1   metFORMIN (GLUCOPHAGE) 500 MG tablet Take 1,000 mg by mouth 2 (two) times daily.     metoprolol succinate (TOPROL-XL) 100 MG 24 hr tablet Take 100 mg by mouth daily.     potassium chloride SA (KLOR-CON) 20 MEQ tablet Take 20 mEq by mouth daily.     No current facility-administered medications for this visit.    Allergies as of 02/28/2021 - Review Complete 02/28/2021  Allergen Reaction Noted   Methylphenidate derivatives Other (See Comments) 02/17/2011    Family History  Problem Relation Age of Onset   Diabetes Mother    Diabetes Father     Social History    Socioeconomic History   Marital status: Single    Spouse name: Not on file   Number of children: Not on file   Years of education: Not on file   Highest education level: Not on file  Occupational History   Not on file  Tobacco Use   Smoking status: Former    Packs/day: 0.50    Years: 5.00    Pack years: 2.50    Types: E-cigarettes, Cigarettes    Quit date: 02/24/2009    Years since quitting: 12.0   Smokeless tobacco: Never  Vaping Use   Vaping Use: Former   Quit date: 12/13/2018  Substance and Sexual Activity   Alcohol use: Not Currently    Comment: once year   Drug use: No   Sexual activity: Never  Other Topics Concern   Not on file  Social History Narrative   Not on file   Social Determinants of Health   Financial Resource Strain: Not on file  Food Insecurity: Not on file  Transportation Needs: Not on file  Physical Activity: Not on file  Stress: Not on file  Social Connections: Not on file   Review of Systems: Gen: Denies fever, chills, anorexia. Denies fatigue, weakness, weight loss.  CV: Denies chest pain, palpitations, syncope, peripheral edema, and claudication. Resp: Denies dyspnea at rest, cough, wheezing, coughing up blood, and pleurisy. GI: Denies vomiting blood, jaundice, and fecal incontinence. Denies dysphagia or odynophagia. Reflux symptoms Derm: Denies rash, itching, dry skin Psych: Denies depression, anxiety, memory loss, confusion. No homicidal or suicidal ideation.  Heme: Denies bruising, bleeding, and enlarged lymph nodes.  Physical Exam: BP (!) 174/118 (BP Location: Right Arm, Patient Position: Sitting, Cuff Size: Large)   Pulse (!) 102   Temp 99.1 F (37.3 C) (Oral)   Ht $R'5\' 11"'VT$  (1.803 m)   Wt (!) 331 lb (150.1 kg)   BMI 46.17 kg/m  General:   Alert and oriented. No distress noted. Pleasant and cooperative. Obese. Head:  Normocephalic and atraumatic. Eyes:  Conjuctiva clear without scleral icterus. Heart: Normal rate and rhythm, s1  and s2 heart sounds present.  Lungs: Clear lung sounds in all lobes. Respirations equal and unlabored. Abdomen:  +BS, soft, non-tender and non-distended. No rebound or guarding. No HSM or masses noted. Derm: No palmar erythema or jaundice Msk:  Symmetrical without gross deformities. Normal posture. Extremities:  Without edema. Neurologic:  Alert and  oriented x4 Psych:  Alert and cooperative. Normal mood and affect.  ASSESSMENT: Kyle Collins is a 35 y.o. male presenting today for follow up of GERD and RUQ/RLQ abdominal pain.   RUQ/RLQ pain subsided since EGD in May. Denies any recent episodes of pain.  Not currently on PPI therapy for his GERD. States he has reflux on occasion, especially when eating certain foods. Has taken nexium in the past with good result. Patient also had some duodenum non bleeding erosions present on EGD in May 2022. He does take excedrin for migraines 1-2x per week. We discussed the importance of avoiding NSAIDs as much as possible and considering talking to PCP about a preventative medication for migraines that would be less likely to illicit GI issues.  Given reflux symptoms as well as duodenal erosions on most recent EGD, We will start omeprazole 40 mg daily (nexium not covered by insurance). We can step down to $Remov'20mg'nBYwyt$  daily at next visit if symptoms are well controlled without breakthrough symptoms on $RemoveBef'40mg'EzhaVOKgWa$ .   It is also important that patient attempts to decrease his BMI as his weight is a large factor in symptoms of GERD. He denies dysphagia, odynophagia, abdominal pain, nausea or vomiting.   BP elevated in clinic today on initial reading as well as repeat (174/118 and 185/128). Patient reports he took his HTN meds about 30 minutes prior to visit. He also has hx of hemochromatosis and states he is due for phlebotomy. Patient denies any headache, visual changes, dizziness or other red flag symptoms that would be cause of concern for hypertensive crisis at this  time.  No red flag symptoms. Patient denies melena, hematochezia, nausea, vomiting, diarrhea, constipation, dysphagia, odyonophagia, early satiety or weight loss.   PLAN:  Initially planned nexium $RemoveBefore'40mg'rAhuGUwGUaxqN$  daily, not covered by insurance will trial omeprazole $RemoveBeforeDE'40mg'BxamzwcGLKVTtCE$  daily, can step down to $Remov'20mg'USWxVW$  daily if well controlled on 40, reassess at next visit 2. Please avoid NSAIDs (excedrin, ibuprofen, aleve, advil, aspirin, goody powder) as much as  possible, as we discussed, these are very hard on your GI system. 3. be mindful of foods that cause more reflux or bloating  so that you can avoid these if they are causing you discomfort 4. continue to drink plenty of water to keep stools soft  Follow Up: 1 year  Case discussed with Dr. Jenetta Downer who is in agreement with plan of care, as outlined above.   Fatumata Kashani L. Alver Sorrow, MSN, APRN, AGNP-C Adult-Gerontology Nurse Practitioner [facility]

## 2021-02-28 NOTE — Patient Instructions (Addendum)
-  Nexium '40mg'$  has been sent to your pharmacy. Please take this once a day, in the mornings 30-45 minutes before breakfast. -Please avoid NSAIDs (excedrin, ibuprofen, aleve, advil, aspirin, goody powder) as much as possible, as we discussed, these are very hard on your GI system. -be mindful of foods that cause more reflux or bloating so that you can avoid these if they are causing you discomfort -continue to drink plenty of water to keep stools soft  Follow up in 1 year  It was a pleasure caring for you today!

## 2021-03-04 DIAGNOSIS — I10 Essential (primary) hypertension: Secondary | ICD-10-CM | POA: Diagnosis not present

## 2021-03-04 DIAGNOSIS — D751 Secondary polycythemia: Secondary | ICD-10-CM | POA: Diagnosis not present

## 2021-03-04 DIAGNOSIS — E1165 Type 2 diabetes mellitus with hyperglycemia: Secondary | ICD-10-CM | POA: Diagnosis not present

## 2021-03-04 DIAGNOSIS — Z794 Long term (current) use of insulin: Secondary | ICD-10-CM | POA: Diagnosis not present

## 2021-04-09 DIAGNOSIS — I10 Essential (primary) hypertension: Secondary | ICD-10-CM | POA: Diagnosis not present

## 2021-04-09 DIAGNOSIS — D751 Secondary polycythemia: Secondary | ICD-10-CM | POA: Diagnosis not present

## 2021-04-09 DIAGNOSIS — Z794 Long term (current) use of insulin: Secondary | ICD-10-CM | POA: Diagnosis not present

## 2021-04-09 DIAGNOSIS — E1165 Type 2 diabetes mellitus with hyperglycemia: Secondary | ICD-10-CM | POA: Diagnosis not present

## 2021-04-09 DIAGNOSIS — R7989 Other specified abnormal findings of blood chemistry: Secondary | ICD-10-CM | POA: Diagnosis not present

## 2021-04-10 DIAGNOSIS — K219 Gastro-esophageal reflux disease without esophagitis: Secondary | ICD-10-CM | POA: Diagnosis not present

## 2021-04-10 DIAGNOSIS — G4730 Sleep apnea, unspecified: Secondary | ICD-10-CM | POA: Diagnosis not present

## 2021-04-10 DIAGNOSIS — E1142 Type 2 diabetes mellitus with diabetic polyneuropathy: Secondary | ICD-10-CM | POA: Diagnosis not present

## 2021-04-10 DIAGNOSIS — I10 Essential (primary) hypertension: Secondary | ICD-10-CM | POA: Diagnosis not present

## 2021-04-10 DIAGNOSIS — J309 Allergic rhinitis, unspecified: Secondary | ICD-10-CM | POA: Diagnosis not present

## 2021-04-10 DIAGNOSIS — S43491S Other sprain of right shoulder joint, sequela: Secondary | ICD-10-CM | POA: Diagnosis not present

## 2021-04-16 DIAGNOSIS — D751 Secondary polycythemia: Secondary | ICD-10-CM | POA: Diagnosis not present

## 2021-06-10 DIAGNOSIS — I10 Essential (primary) hypertension: Secondary | ICD-10-CM | POA: Diagnosis not present

## 2021-06-10 DIAGNOSIS — R7989 Other specified abnormal findings of blood chemistry: Secondary | ICD-10-CM | POA: Diagnosis not present

## 2021-06-10 DIAGNOSIS — Z794 Long term (current) use of insulin: Secondary | ICD-10-CM | POA: Diagnosis not present

## 2021-06-10 DIAGNOSIS — E1165 Type 2 diabetes mellitus with hyperglycemia: Secondary | ICD-10-CM | POA: Diagnosis not present

## 2021-06-10 DIAGNOSIS — D751 Secondary polycythemia: Secondary | ICD-10-CM | POA: Diagnosis not present

## 2021-07-10 DIAGNOSIS — I10 Essential (primary) hypertension: Secondary | ICD-10-CM | POA: Diagnosis not present

## 2021-07-10 DIAGNOSIS — S43491S Other sprain of right shoulder joint, sequela: Secondary | ICD-10-CM | POA: Diagnosis not present

## 2021-07-10 DIAGNOSIS — E1142 Type 2 diabetes mellitus with diabetic polyneuropathy: Secondary | ICD-10-CM | POA: Diagnosis not present

## 2021-07-10 DIAGNOSIS — Z0000 Encounter for general adult medical examination without abnormal findings: Secondary | ICD-10-CM | POA: Diagnosis not present

## 2021-07-10 DIAGNOSIS — K219 Gastro-esophageal reflux disease without esophagitis: Secondary | ICD-10-CM | POA: Diagnosis not present

## 2021-07-10 DIAGNOSIS — J309 Allergic rhinitis, unspecified: Secondary | ICD-10-CM | POA: Diagnosis not present

## 2021-07-10 DIAGNOSIS — Z1331 Encounter for screening for depression: Secondary | ICD-10-CM | POA: Diagnosis not present

## 2021-07-10 DIAGNOSIS — G4730 Sleep apnea, unspecified: Secondary | ICD-10-CM | POA: Diagnosis not present

## 2021-08-08 DIAGNOSIS — D751 Secondary polycythemia: Secondary | ICD-10-CM | POA: Diagnosis not present

## 2021-08-08 DIAGNOSIS — I10 Essential (primary) hypertension: Secondary | ICD-10-CM | POA: Diagnosis not present

## 2021-08-08 DIAGNOSIS — R7989 Other specified abnormal findings of blood chemistry: Secondary | ICD-10-CM | POA: Diagnosis not present

## 2021-08-14 DIAGNOSIS — G4733 Obstructive sleep apnea (adult) (pediatric): Secondary | ICD-10-CM | POA: Diagnosis not present

## 2021-08-14 DIAGNOSIS — I10 Essential (primary) hypertension: Secondary | ICD-10-CM | POA: Diagnosis not present

## 2021-08-14 DIAGNOSIS — E119 Type 2 diabetes mellitus without complications: Secondary | ICD-10-CM | POA: Diagnosis not present

## 2021-08-14 DIAGNOSIS — Z794 Long term (current) use of insulin: Secondary | ICD-10-CM | POA: Diagnosis not present

## 2021-08-14 DIAGNOSIS — D751 Secondary polycythemia: Secondary | ICD-10-CM | POA: Diagnosis not present

## 2021-08-19 DIAGNOSIS — D751 Secondary polycythemia: Secondary | ICD-10-CM | POA: Diagnosis not present

## 2021-08-19 DIAGNOSIS — I10 Essential (primary) hypertension: Secondary | ICD-10-CM | POA: Diagnosis not present

## 2021-08-27 DIAGNOSIS — G4733 Obstructive sleep apnea (adult) (pediatric): Secondary | ICD-10-CM | POA: Diagnosis not present

## 2021-10-17 DIAGNOSIS — I10 Essential (primary) hypertension: Secondary | ICD-10-CM | POA: Diagnosis not present

## 2021-10-17 DIAGNOSIS — J309 Allergic rhinitis, unspecified: Secondary | ICD-10-CM | POA: Diagnosis not present

## 2021-10-17 DIAGNOSIS — E1142 Type 2 diabetes mellitus with diabetic polyneuropathy: Secondary | ICD-10-CM | POA: Diagnosis not present

## 2021-10-17 DIAGNOSIS — M79602 Pain in left arm: Secondary | ICD-10-CM | POA: Diagnosis not present

## 2021-10-17 DIAGNOSIS — K219 Gastro-esophageal reflux disease without esophagitis: Secondary | ICD-10-CM | POA: Diagnosis not present

## 2021-10-17 DIAGNOSIS — G4730 Sleep apnea, unspecified: Secondary | ICD-10-CM | POA: Diagnosis not present

## 2021-11-20 DIAGNOSIS — I10 Essential (primary) hypertension: Secondary | ICD-10-CM | POA: Diagnosis not present

## 2021-11-20 DIAGNOSIS — D751 Secondary polycythemia: Secondary | ICD-10-CM | POA: Diagnosis not present

## 2021-11-20 DIAGNOSIS — G4733 Obstructive sleep apnea (adult) (pediatric): Secondary | ICD-10-CM | POA: Diagnosis not present

## 2021-11-20 DIAGNOSIS — Z748 Other problems related to care provider dependency: Secondary | ICD-10-CM | POA: Diagnosis not present

## 2021-11-20 DIAGNOSIS — E119 Type 2 diabetes mellitus without complications: Secondary | ICD-10-CM | POA: Diagnosis not present

## 2021-11-20 DIAGNOSIS — Z736 Limitation of activities due to disability: Secondary | ICD-10-CM | POA: Diagnosis not present

## 2021-12-12 DIAGNOSIS — D751 Secondary polycythemia: Secondary | ICD-10-CM | POA: Diagnosis not present

## 2021-12-12 DIAGNOSIS — R7989 Other specified abnormal findings of blood chemistry: Secondary | ICD-10-CM | POA: Diagnosis not present

## 2022-01-02 DIAGNOSIS — G4733 Obstructive sleep apnea (adult) (pediatric): Secondary | ICD-10-CM | POA: Diagnosis not present

## 2022-01-02 DIAGNOSIS — F319 Bipolar disorder, unspecified: Secondary | ICD-10-CM | POA: Diagnosis not present

## 2022-01-02 DIAGNOSIS — E119 Type 2 diabetes mellitus without complications: Secondary | ICD-10-CM | POA: Diagnosis not present

## 2022-01-02 DIAGNOSIS — D751 Secondary polycythemia: Secondary | ICD-10-CM | POA: Diagnosis not present

## 2022-01-02 DIAGNOSIS — I10 Essential (primary) hypertension: Secondary | ICD-10-CM | POA: Diagnosis not present

## 2022-01-23 DIAGNOSIS — K219 Gastro-esophageal reflux disease without esophagitis: Secondary | ICD-10-CM | POA: Diagnosis not present

## 2022-01-23 DIAGNOSIS — G4730 Sleep apnea, unspecified: Secondary | ICD-10-CM | POA: Diagnosis not present

## 2022-01-23 DIAGNOSIS — E1142 Type 2 diabetes mellitus with diabetic polyneuropathy: Secondary | ICD-10-CM | POA: Diagnosis not present

## 2022-01-23 DIAGNOSIS — J309 Allergic rhinitis, unspecified: Secondary | ICD-10-CM | POA: Diagnosis not present

## 2022-01-23 DIAGNOSIS — I161 Hypertensive emergency: Secondary | ICD-10-CM | POA: Diagnosis not present

## 2022-01-23 DIAGNOSIS — M79602 Pain in left arm: Secondary | ICD-10-CM | POA: Diagnosis not present

## 2022-02-06 ENCOUNTER — Ambulatory Visit: Payer: Medicare Other | Admitting: Orthopaedic Surgery

## 2022-02-20 ENCOUNTER — Ambulatory Visit (INDEPENDENT_AMBULATORY_CARE_PROVIDER_SITE_OTHER): Payer: Medicare Other | Admitting: Orthopaedic Surgery

## 2022-02-20 ENCOUNTER — Ambulatory Visit (INDEPENDENT_AMBULATORY_CARE_PROVIDER_SITE_OTHER): Payer: Medicare Other

## 2022-02-20 ENCOUNTER — Encounter: Payer: Self-pay | Admitting: Orthopaedic Surgery

## 2022-02-20 VITALS — BP 210/140 | HR 108 | Ht 71.0 in | Wt 346.0 lb

## 2022-02-20 DIAGNOSIS — G8929 Other chronic pain: Secondary | ICD-10-CM

## 2022-02-20 DIAGNOSIS — M25512 Pain in left shoulder: Secondary | ICD-10-CM

## 2022-02-20 DIAGNOSIS — M7542 Impingement syndrome of left shoulder: Secondary | ICD-10-CM | POA: Diagnosis not present

## 2022-02-20 DIAGNOSIS — M5412 Radiculopathy, cervical region: Secondary | ICD-10-CM

## 2022-02-20 MED ORDER — BUPIVACAINE HCL 0.25 % IJ SOLN
4.0000 mL | INTRAMUSCULAR | Status: AC | PRN
  Administered 2022-02-20: 4 mL via INTRA_ARTICULAR

## 2022-02-20 MED ORDER — METHYLPREDNISOLONE ACETATE 40 MG/ML IJ SUSP
40.0000 mg | INTRAMUSCULAR | Status: AC | PRN
  Administered 2022-02-20: 40 mg via INTRA_ARTICULAR

## 2022-02-20 MED ORDER — LIDOCAINE HCL 1 % IJ SOLN
0.5000 mL | INTRAMUSCULAR | Status: AC | PRN
  Administered 2022-02-20: .5 mL

## 2022-02-20 NOTE — Progress Notes (Signed)
Office Visit Note   Patient: Kyle Collins           Date of Birth: 30-Nov-1985           MRN: [id number] Visit Date: 02/20/2022              Requested by: Neale Burly, MD [place],  [place] [id number] PCP: Neale Burly, MD   Assessment & Plan: Visit Diagnoses:  1. Cervical radiculopathy   2. Chronic left shoulder pain   3. Impingement syndrome of left shoulder     Plan: Patient tolerated the injection well.  X-ray results were reviewed.  If he has ongoing problems he will call and let us know and we can consider MRI imaging of his left shoulder.  Follow-Up Instructions: No follow-ups on file.   Orders:  Orders Placed This Encounter  Procedures   Large Joint Inj   XR Shoulder Left   XR Cervical Spine 2 or 3 views   No orders of the defined types were placed in this encounter.     Procedures: Large Joint Inj: L subacromial bursa on 02/20/2022 11:47 AM Indications: pain Details: 22 G 1.5 in needle, lateral approach  Arthrogram: No  Medications: 4 mL bupivacaine 0.25 %; 40 mg methylPREDNISolone acetate 40 MG/ML; 0.5 mL lidocaine 1 % Outcome: tolerated well, no immediate complications Procedure, treatment alternatives, risks and benefits explained, specific risks discussed. Consent was given by the patient. Immediately prior to procedure a time out was called to verify the correct patient, procedure, equipment, support staff and site/side marked as required. Patient was prepped and draped in the usual sterile fashion.       Clinical Data: No additional findings.   Subjective: Chief Complaint  Patient presents with   Left Shoulder - Pain    Chronic pain for 1 yr. Decreased ROM, grip strength, pain shooting down to fingers.    HPI 36 year old male referred by Dr. Berton Lan for problems with progressive left shoulder pain.  He is right-hand dominant has noted decreased range of motion of his left arm difficulty with abduction outstretched reaching and  reaching behind him.  No past history of significant injury.  Patient has diabetes hemochromatosis and is disabled not working.  He does have increased BMI, sleep apnea hypertension and type 2 diabetes.  Review of Systems previous DKA.  History of a tuberosity fracture right proximal humerus.  All the systems are noncontributory HPI.   Objective: Vital Signs: BP (!) 210/140   Pulse (!) 108   Ht '5\' 11"'$  (1.803 m)   Wt (!) 346 lb (156.9 kg)   BMI 48.26 kg/m   Physical Exam Constitutional:      Appearance: He is well-developed.  HENT:     Head: Normocephalic and atraumatic.     Right Ear: External ear normal.     Left Ear: External ear normal.  Eyes:     Pupils: Pupils are equal, round, and reactive to light.  Neck:     Thyroid: No thyromegaly.     Trachea: No tracheal deviation.  Cardiovascular:     Rate and Rhythm: Normal rate.  Pulmonary:     Effort: Pulmonary effort is normal.     Breath sounds: No wheezing.  Abdominal:     General: Bowel sounds are normal.     Palpations: Abdomen is soft.  Musculoskeletal:     Cervical back: Neck supple.  Skin:    General: Skin is warm and dry.  Capillary Refill: Capillary refill takes less than 2 seconds.  Neurological:     Mental Status: He is alert and oriented to person, place, and time.  Psychiatric:        Behavior: Behavior normal.        Thought Content: Thought content normal.        Judgment: Judgment normal.     Ortho Exam left shoulder positive impingement Mccranie of the biceps is intact elbow extension good cervical range of motion no brachial plexus tenderness.  Sensation hand is intact.  Opposite right shoulder has full range of motion without discomfort negative impingement.  Specialty Comments:  No specialty comments available.  Imaging: XR Cervical Spine 2 or 3 views  Result Date: 02/20/2022 [facility] lateral cervical spine images are obtained and reviewed this shows some straightening of the cervical spine.  Disc  space height is maintained no listhesis. Impression: Cervical spine with some loss of normal cervical lordosis.  No significant degenerative changes.  XR Shoulder Left  Result Date: 02/20/2022 Three-view x-rays left shoulder obtained and reviewed.  This shows a small inferior glenohumeral spurring.  Glenohumeral joint is well located.  Normal acromioclavicular joint. Impression: Left shoulder tiny inferior spurs otherwise normal radiographs.    PMFS History: Patient Active Problem List   Diagnosis Date Noted   Impingement syndrome of left shoulder 02/20/2022   Chronic RUQ pain 11/22/2020   DKA (diabetic ketoacidoses) 11/22/2018   DKA (diabetic ketoacidosis) ([place]) 11/20/2018   Sleep apnea 11/20/2018   Gastroesophageal reflux disease without esophagitis 11/20/2018   Polycythemia 11/20/2018   Hyperbilirubinemia 11/20/2018   S/P nasal septoplasty 08/18/2018   Fracture dislocation of right shoulder joint 12/04/2015   Dislocation, shoulder closed    Greater tuberosity of humerus fracture    Morbid obesity due to excess calories ([place]) 10/26/2015   Uncontrolled type 2 diabetes mellitus without complication, without Harder-term current use of insulin 07/16/2015   Essential hypertension, benign 07/16/2015   Personal history of noncompliance with medical treatment, presenting hazards to health 07/16/2015   Past Medical History:  Diagnosis Date   ADHD (attention deficit hyperactivity disorder)    Anxiety    Bipolar disorder ([place])    Depression    Diabetes mellitus    GERD (gastroesophageal reflux disease)    Headache    Hemochromatosis 2019   Hypertension    Polycythemia    Sleep apnea    Thyroid disease     Family History  Problem Relation Age of Onset   Diabetes Mother    Diabetes Father     Past Surgical History:  Procedure Laterality Date   BIOPSY  12/14/2020   Procedure: BIOPSY;  Surgeon: Harvel Quale, MD;  [facility];  Service: Gastroenterology;;    CHOLECYSTECTOMY N/A 11/24/2018   Procedure: LAPAROSCOPIC CHOLECYSTECTOMY;  Surgeon: Aviva Signs, MD;  [facility];  Service: General;  Laterality: N/A;   CIRCUMCISION N/A 09/14/2017   Procedure: CIRCUMCISION;  Surgeon: Cleon Gustin, MD;  [facility];  Service: Urology;  Laterality: N/A;  1 HR 336 [phone] - He has a Education officer, museum that needs to be called if changes made Mateo Flow @ [place] MEDICAID-[id number] N   ESOPHAGOGASTRODUODENOSCOPY (EGD) WITH PROPOFOL N/A 12/14/2020   castaneda:Normal esophagus. z line irregular, normal stomach, duodenal erosions without bleeding, neg h pylori, biopsies unremarkable.   HERNIA REPAIR     scrotum   MAXILLARY ANTROSTOMY Right 08/18/2018   Procedure: ENDOSCOPIC MAXILLARY RIGHT ANTROSTOMY WITH TISSUE REMOVAL;  Surgeon: Leta Baptist,  MD;  Location: [place];  Service: ENT;  Laterality: Right;   NASAL SEPTOPLASTY W/ TURBINOPLASTY Bilateral 08/18/2018   Procedure: NASAL SEPTOPLASTY WITH TURBINATE REDUCTION;  Surgeon: Leta Baptist, MD;  Location: [place];  Service: ENT;  Laterality: Bilateral;   NASAL SEPTUM SURGERY  08/18/2018   ORIF SHOULDER FRACTURE Right 02/28/2016   Procedure: OPEN REDUCTION INTERNAL FIXATION (ORIF) SHOULDER FRACTURE;  Surgeon: Carole Civil, MD;  [facility];  Service: Orthopedics;  Laterality: Right;   SHOULDER CLOSED REDUCTION Right 12/04/2015   Procedure: CLOSED REDUCTION SHOULDER;  Surgeon: Carole Civil, MD;  [facility];  Service: Orthopedics;  Laterality: Right;   TOOTH EXTRACTION  spring 2016   top left    Social History   Occupational History   Not on file  Tobacco Use   Smoking status: Former    Packs/day: 0.50    Years: 5.00    Total pack years: 2.50    Types: E-cigarettes, Cigarettes    Quit date: 02/24/2009    Years since quitting: 12.9   Smokeless tobacco: Never  Vaping Use   Vaping Use: Former   Quit date: 12/13/2018  Substance and Sexual Activity   Alcohol use:  Not Currently    Comment: once year   Drug use: No   Sexual activity: Never

## 2022-03-10 DIAGNOSIS — G4733 Obstructive sleep apnea (adult) (pediatric): Secondary | ICD-10-CM | POA: Diagnosis not present

## 2022-03-10 DIAGNOSIS — I10 Essential (primary) hypertension: Secondary | ICD-10-CM | POA: Diagnosis not present

## 2022-03-10 DIAGNOSIS — D751 Secondary polycythemia: Secondary | ICD-10-CM | POA: Diagnosis not present

## 2022-04-08 DIAGNOSIS — R7989 Other specified abnormal findings of blood chemistry: Secondary | ICD-10-CM | POA: Diagnosis not present

## 2022-04-08 DIAGNOSIS — D751 Secondary polycythemia: Secondary | ICD-10-CM | POA: Diagnosis not present

## 2022-04-18 DIAGNOSIS — G4733 Obstructive sleep apnea (adult) (pediatric): Secondary | ICD-10-CM | POA: Diagnosis not present

## 2022-04-18 DIAGNOSIS — Z7982 Long term (current) use of aspirin: Secondary | ICD-10-CM | POA: Diagnosis not present

## 2022-04-18 DIAGNOSIS — D751 Secondary polycythemia: Secondary | ICD-10-CM | POA: Diagnosis not present

## 2022-04-18 DIAGNOSIS — R5382 Chronic fatigue, unspecified: Secondary | ICD-10-CM | POA: Diagnosis not present

## 2022-04-18 DIAGNOSIS — I10 Essential (primary) hypertension: Secondary | ICD-10-CM | POA: Diagnosis not present

## 2022-04-18 DIAGNOSIS — Z7984 Long term (current) use of oral hypoglycemic drugs: Secondary | ICD-10-CM | POA: Diagnosis not present

## 2022-04-18 DIAGNOSIS — Z794 Long term (current) use of insulin: Secondary | ICD-10-CM | POA: Diagnosis not present

## 2022-04-18 DIAGNOSIS — E119 Type 2 diabetes mellitus without complications: Secondary | ICD-10-CM | POA: Diagnosis not present

## 2022-04-18 DIAGNOSIS — F32A Depression, unspecified: Secondary | ICD-10-CM | POA: Diagnosis not present

## 2022-05-08 ENCOUNTER — Other Ambulatory Visit (INDEPENDENT_AMBULATORY_CARE_PROVIDER_SITE_OTHER): Payer: Self-pay | Admitting: Gastroenterology

## 2022-05-08 DIAGNOSIS — K219 Gastro-esophageal reflux disease without esophagitis: Secondary | ICD-10-CM

## 2022-05-15 DIAGNOSIS — E119 Type 2 diabetes mellitus without complications: Secondary | ICD-10-CM | POA: Diagnosis not present

## 2022-05-15 DIAGNOSIS — I10 Essential (primary) hypertension: Secondary | ICD-10-CM | POA: Diagnosis not present

## 2022-05-15 DIAGNOSIS — G4733 Obstructive sleep apnea (adult) (pediatric): Secondary | ICD-10-CM | POA: Diagnosis not present

## 2022-05-15 DIAGNOSIS — E1140 Type 2 diabetes mellitus with diabetic neuropathy, unspecified: Secondary | ICD-10-CM | POA: Diagnosis not present

## 2022-05-15 DIAGNOSIS — F418 Other specified anxiety disorders: Secondary | ICD-10-CM | POA: Diagnosis not present

## 2022-05-15 DIAGNOSIS — Z7689 Persons encountering health services in other specified circumstances: Secondary | ICD-10-CM | POA: Diagnosis not present

## 2022-05-15 DIAGNOSIS — Z713 Dietary counseling and surveillance: Secondary | ICD-10-CM | POA: Diagnosis not present

## 2022-05-15 DIAGNOSIS — Z79899 Other long term (current) drug therapy: Secondary | ICD-10-CM | POA: Diagnosis not present

## 2022-05-15 DIAGNOSIS — F319 Bipolar disorder, unspecified: Secondary | ICD-10-CM | POA: Diagnosis not present

## 2022-05-15 DIAGNOSIS — Z6841 Body Mass Index (BMI) 40.0 and over, adult: Secondary | ICD-10-CM | POA: Diagnosis not present

## 2022-05-15 DIAGNOSIS — D45 Polycythemia vera: Secondary | ICD-10-CM | POA: Diagnosis not present

## 2022-05-15 DIAGNOSIS — M7552 Bursitis of left shoulder: Secondary | ICD-10-CM | POA: Diagnosis not present

## 2022-05-22 DIAGNOSIS — G4730 Sleep apnea, unspecified: Secondary | ICD-10-CM | POA: Diagnosis not present

## 2022-05-22 DIAGNOSIS — E1142 Type 2 diabetes mellitus with diabetic polyneuropathy: Secondary | ICD-10-CM | POA: Diagnosis not present

## 2022-05-22 DIAGNOSIS — J309 Allergic rhinitis, unspecified: Secondary | ICD-10-CM | POA: Diagnosis not present

## 2022-05-22 DIAGNOSIS — K219 Gastro-esophageal reflux disease without esophagitis: Secondary | ICD-10-CM | POA: Diagnosis not present

## 2022-06-23 DIAGNOSIS — F332 Major depressive disorder, recurrent severe without psychotic features: Secondary | ICD-10-CM | POA: Diagnosis not present

## 2022-07-04 DIAGNOSIS — R7989 Other specified abnormal findings of blood chemistry: Secondary | ICD-10-CM | POA: Diagnosis not present

## 2022-07-04 DIAGNOSIS — Z7189 Other specified counseling: Secondary | ICD-10-CM | POA: Diagnosis not present

## 2022-07-04 DIAGNOSIS — D751 Secondary polycythemia: Secondary | ICD-10-CM | POA: Diagnosis not present

## 2022-07-14 DIAGNOSIS — F332 Major depressive disorder, recurrent severe without psychotic features: Secondary | ICD-10-CM | POA: Diagnosis not present

## 2022-07-17 DIAGNOSIS — G4733 Obstructive sleep apnea (adult) (pediatric): Secondary | ICD-10-CM | POA: Diagnosis not present

## 2022-07-17 DIAGNOSIS — D751 Secondary polycythemia: Secondary | ICD-10-CM | POA: Diagnosis not present

## 2022-07-17 DIAGNOSIS — Z6841 Body Mass Index (BMI) 40.0 and over, adult: Secondary | ICD-10-CM | POA: Diagnosis not present

## 2022-08-18 DIAGNOSIS — F332 Major depressive disorder, recurrent severe without psychotic features: Secondary | ICD-10-CM | POA: Diagnosis not present

## 2022-08-21 DIAGNOSIS — G4730 Sleep apnea, unspecified: Secondary | ICD-10-CM | POA: Diagnosis not present

## 2022-08-21 DIAGNOSIS — K219 Gastro-esophageal reflux disease without esophagitis: Secondary | ICD-10-CM | POA: Diagnosis not present

## 2022-08-21 DIAGNOSIS — R232 Flushing: Secondary | ICD-10-CM | POA: Diagnosis not present

## 2022-08-21 DIAGNOSIS — J309 Allergic rhinitis, unspecified: Secondary | ICD-10-CM | POA: Diagnosis not present

## 2022-08-21 DIAGNOSIS — E1142 Type 2 diabetes mellitus with diabetic polyneuropathy: Secondary | ICD-10-CM | POA: Diagnosis not present

## 2022-09-22 DIAGNOSIS — F332 Major depressive disorder, recurrent severe without psychotic features: Secondary | ICD-10-CM | POA: Diagnosis not present

## 2022-10-16 DIAGNOSIS — D751 Secondary polycythemia: Secondary | ICD-10-CM | POA: Diagnosis not present

## 2022-10-23 DIAGNOSIS — D751 Secondary polycythemia: Secondary | ICD-10-CM | POA: Diagnosis not present

## 2022-10-23 DIAGNOSIS — G4730 Sleep apnea, unspecified: Secondary | ICD-10-CM | POA: Diagnosis not present

## 2022-12-04 DIAGNOSIS — Z1331 Encounter for screening for depression: Secondary | ICD-10-CM | POA: Diagnosis not present

## 2022-12-04 DIAGNOSIS — G4730 Sleep apnea, unspecified: Secondary | ICD-10-CM | POA: Diagnosis not present

## 2022-12-04 DIAGNOSIS — Z0000 Encounter for general adult medical examination without abnormal findings: Secondary | ICD-10-CM | POA: Diagnosis not present

## 2022-12-04 DIAGNOSIS — K219 Gastro-esophageal reflux disease without esophagitis: Secondary | ICD-10-CM | POA: Diagnosis not present

## 2022-12-04 DIAGNOSIS — I10 Essential (primary) hypertension: Secondary | ICD-10-CM | POA: Diagnosis not present

## 2022-12-04 DIAGNOSIS — R232 Flushing: Secondary | ICD-10-CM | POA: Diagnosis not present

## 2022-12-04 DIAGNOSIS — J309 Allergic rhinitis, unspecified: Secondary | ICD-10-CM | POA: Diagnosis not present

## 2022-12-04 DIAGNOSIS — M5459 Other low back pain: Secondary | ICD-10-CM | POA: Diagnosis not present

## 2022-12-04 DIAGNOSIS — E1142 Type 2 diabetes mellitus with diabetic polyneuropathy: Secondary | ICD-10-CM | POA: Diagnosis not present

## 2022-12-25 DIAGNOSIS — R0602 Shortness of breath: Secondary | ICD-10-CM | POA: Diagnosis not present

## 2022-12-25 DIAGNOSIS — M545 Low back pain, unspecified: Secondary | ICD-10-CM | POA: Diagnosis not present

## 2022-12-25 DIAGNOSIS — M47816 Spondylosis without myelopathy or radiculopathy, lumbar region: Secondary | ICD-10-CM | POA: Diagnosis not present

## 2022-12-25 DIAGNOSIS — M546 Pain in thoracic spine: Secondary | ICD-10-CM | POA: Diagnosis not present

## 2023-02-19 DIAGNOSIS — S8000XA Contusion of unspecified knee, initial encounter: Secondary | ICD-10-CM | POA: Diagnosis not present

## 2023-02-19 DIAGNOSIS — E1142 Type 2 diabetes mellitus with diabetic polyneuropathy: Secondary | ICD-10-CM | POA: Diagnosis not present

## 2023-02-19 DIAGNOSIS — K219 Gastro-esophageal reflux disease without esophagitis: Secondary | ICD-10-CM | POA: Diagnosis not present

## 2023-02-19 DIAGNOSIS — Z0000 Encounter for general adult medical examination without abnormal findings: Secondary | ICD-10-CM | POA: Diagnosis not present

## 2023-02-19 DIAGNOSIS — M5459 Other low back pain: Secondary | ICD-10-CM | POA: Diagnosis not present

## 2023-02-19 DIAGNOSIS — J309 Allergic rhinitis, unspecified: Secondary | ICD-10-CM | POA: Diagnosis not present

## 2023-02-19 DIAGNOSIS — R232 Flushing: Secondary | ICD-10-CM | POA: Diagnosis not present

## 2023-02-19 DIAGNOSIS — Z1331 Encounter for screening for depression: Secondary | ICD-10-CM | POA: Diagnosis not present

## 2023-02-19 DIAGNOSIS — G4730 Sleep apnea, unspecified: Secondary | ICD-10-CM | POA: Diagnosis not present

## 2023-04-02 ENCOUNTER — Emergency Department (HOSPITAL_COMMUNITY)
Admission: EM | Admit: 2023-04-02 | Discharge: 2023-04-02 | Disposition: A | Discharge status: Home / Self Care | Payer: Medicare Other | Attending: Emergency Medicine | Admitting: Emergency Medicine

## 2023-04-02 ENCOUNTER — Emergency Department (HOSPITAL_COMMUNITY): Payer: Medicare Other

## 2023-04-02 ENCOUNTER — Other Ambulatory Visit: Payer: Self-pay

## 2023-04-02 ENCOUNTER — Encounter (HOSPITAL_COMMUNITY): Payer: Self-pay | Admitting: Emergency Medicine

## 2023-04-02 DIAGNOSIS — E119 Type 2 diabetes mellitus without complications: Secondary | ICD-10-CM | POA: Insufficient documentation

## 2023-04-02 DIAGNOSIS — Z7984 Long term (current) use of oral hypoglycemic drugs: Secondary | ICD-10-CM | POA: Diagnosis not present

## 2023-04-02 DIAGNOSIS — R1084 Generalized abdominal pain: Secondary | ICD-10-CM | POA: Diagnosis not present

## 2023-04-02 DIAGNOSIS — R1033 Periumbilical pain: Secondary | ICD-10-CM | POA: Diagnosis not present

## 2023-04-02 DIAGNOSIS — I10 Essential (primary) hypertension: Secondary | ICD-10-CM | POA: Diagnosis not present

## 2023-04-02 DIAGNOSIS — R109 Unspecified abdominal pain: Secondary | ICD-10-CM | POA: Diagnosis present

## 2023-04-02 DIAGNOSIS — R000 Tachycardia, unspecified: Secondary | ICD-10-CM | POA: Diagnosis not present

## 2023-04-02 DIAGNOSIS — L03311 Cellulitis of abdominal wall: Secondary | ICD-10-CM | POA: Diagnosis not present

## 2023-04-02 DIAGNOSIS — L03316 Cellulitis of umbilicus: Secondary | ICD-10-CM | POA: Diagnosis not present

## 2023-04-02 DIAGNOSIS — R739 Hyperglycemia, unspecified: Secondary | ICD-10-CM | POA: Diagnosis not present

## 2023-04-02 DIAGNOSIS — Z794 Long term (current) use of insulin: Secondary | ICD-10-CM | POA: Diagnosis not present

## 2023-04-02 DIAGNOSIS — Z9049 Acquired absence of other specified parts of digestive tract: Secondary | ICD-10-CM | POA: Diagnosis not present

## 2023-04-02 DIAGNOSIS — R1031 Right lower quadrant pain: Secondary | ICD-10-CM | POA: Diagnosis not present

## 2023-04-02 LAB — CBC
HCT: 49.2 % (ref 39.0–52.0)
Hemoglobin: 16.9 g/dL (ref 13.0–17.0)
MCH: 30.4 pg (ref 26.0–34.0)
MCHC: 34.3 g/dL (ref 30.0–36.0)
MCV: 88.5 fL (ref 80.0–100.0)
Platelets: 220 10*3/uL (ref 150–400)
RBC: 5.56 MIL/uL (ref 4.22–5.81)
RDW: 12.1 % (ref 11.5–15.5)
WBC: 6.5 10*3/uL (ref 4.0–10.5)
nRBC: 0 % (ref 0.0–0.2)

## 2023-04-02 LAB — URINALYSIS, ROUTINE W REFLEX MICROSCOPIC
Bacteria, UA: NONE SEEN
Bilirubin Urine: NEGATIVE
Glucose, UA: >=500 mg/dL — AB
Hgb urine dipstick: NEGATIVE
Ketones, ur: NEGATIVE mg/dL
Leukocytes,Ua: NEGATIVE
Nitrite: NEGATIVE
Protein, ur: NEGATIVE mg/dL
Specific Gravity, Urine: 1.018 (ref 1.005–1.030)
pH: 6 (ref 5.0–8.0)

## 2023-04-02 LAB — COMPREHENSIVE METABOLIC PANEL
ALT: 40 U/L (ref 0–44)
AST: 28 U/L (ref 15–41)
Albumin: 4.2 g/dL (ref 3.5–5.0)
Alkaline Phosphatase: 68 U/L (ref 38–126)
Anion gap: 9 (ref 5–15)
BUN: 20 mg/dL (ref 6–20)
CO2: 27 mmol/L (ref 22–32)
Calcium: 9.7 mg/dL (ref 8.9–10.3)
Chloride: 99 mmol/L (ref 98–111)
Creatinine, Ser: 0.8 mg/dL (ref 0.61–1.24)
GFR, Estimated: >60 mL/min (ref >60)
Glucose, Bld: 316 mg/dL — ABNORMAL HIGH (ref 70–99)
Potassium: 4 mmol/L (ref 3.5–5.1)
Sodium: 135 mmol/L (ref 135–145)
Total Bilirubin: 1.7 mg/dL — ABNORMAL HIGH (ref 0.3–1.2)
Total Protein: 7.7 g/dL (ref 6.5–8.1)

## 2023-04-02 LAB — LIPASE, BLOOD: Lipase: 60 U/L — ABNORMAL HIGH (ref 11–51)

## 2023-04-02 MED ORDER — IOHEXOL 300 MG/ML  SOLN
100.0000 mL | Freq: Once | INTRAMUSCULAR | Status: AC | PRN
  Administered 2023-04-02: 100 mL via INTRAVENOUS

## 2023-04-02 MED ORDER — LACTATED RINGERS IV BOLUS
1000.0000 mL | Freq: Once | INTRAVENOUS | Status: AC
  Administered 2023-04-02: 1000 mL via INTRAVENOUS

## 2023-04-02 MED ORDER — CEPHALEXIN 500 MG PO CAPS: 500.0000 mg | ORAL_CAPSULE | Freq: Four times a day (QID) | ORAL | 0 refills | Status: DC

## 2023-04-02 MED ORDER — ONDANSETRON HCL 4 MG/2ML IJ SOLN
4.0000 mg | Freq: Once | INTRAMUSCULAR | Status: AC
  Administered 2023-04-02: 4 mg via INTRAVENOUS
  Filled 2023-04-02: qty 2

## 2023-04-02 MED ORDER — MORPHINE SULFATE (PF) 4 MG/ML IV SOLN
4.0000 mg | Freq: Once | INTRAVENOUS | Status: AC
  Administered 2023-04-02: 4 mg via INTRAVENOUS
  Filled 2023-04-02: qty 1

## 2023-04-02 NOTE — ED Provider Notes (Addendum)
[place] EMERGENCY DEPARTMENT AT [facility] Provider Note   CSN: [id number] Arrival date & time: 04/02/23  1045     History  Chief Complaint  Patient presents with   Abdominal Pain    Kyle Collins is a 37 y.o. male.  PMH of diabetes, bipolar disorder, GERD, hemochromatosis, polycythemia, obesity.  Presents to the ER for abdominal pain.  He states this started 1 month ago, he states he had gotten a tetanus shot because he had scrapes on his knees, he states the next day he noticed he had lack of appetite and has been gradually worsening, the past week he has had upper abdominal pain which is now progressed to right mid and lower abdominal pain.  He had no vomiting.  No fevers or chills, no bloody stool, no diarrhea.  Pain is not worse after eating, states he feels worse when he is stressed out.    He was concerned today because he is having bloody drainage from his bellybutton, states today he spit twice and had some blood in it.  He was not vomiting, no blood with coughing.  He does have a mild cough but states it is productive of clear sputum.  He states he just spit saliva out of his mouth this morning.  Denies sore throat, no fevers.  He is not on blood thinners no melena or hematochezia   Abdominal Pain      Home Medications Prior to Admission medications   Medication Sig Start Date End Date Taking? Authorizing Provider  cephALEXin (KEFLEX) 500 MG capsule Take 1 capsule (500 mg total) by mouth 4 (four) times daily. 04/02/23  Yes Sheli Dorin A, PA-C  amLODipine (NORVASC) 10 MG tablet Take 1 tablet (10 mg total) by mouth daily. 11/25/18   Vassie Loll, MD  aspirin-acetaminophen-caffeine (EXCEDRIN MIGRAINE) [phone] MG tablet Take 2 tablets by mouth every 6 (six) hours as needed for headache.    [provider]  atorvastatin (LIPITOR) 10 MG tablet Take by mouth. 05/08/20   [provider]  blood glucose meter kit and supplies Dispense based on  patient and insurance preference. Use up to four times daily as directed. (FOR ICD-10 E10.9, E11.9). 11/24/18   Vassie Loll, MD  chlorthalidone (HYGROTON) 50 MG tablet Take 50 mg by mouth daily. 05/18/19   [provider]  dicyclomine (BENTYL) 20 MG tablet Take 20 mg by mouth daily.    [provider]  esomeprazole (NEXIUM) 40 MG capsule Take 1 capsule (40 mg total) by mouth daily. 02/28/21   Carlan, Chelsea L, NP  gemfibrozil (LOPID) 600 MG tablet Take 600 mg by mouth 2 (two) times daily. 05/25/19   [provider]  Insulin Glargine (BASAGLAR KWIKPEN) 100 UNIT/ML Inject 15 Units into the skin daily.    [provider]  Insulin Pen Needle (ABOUTTIME PEN NEEDLE) 31G X 8 MM MISC Use as needed to inject insulin daily as instructed 11/24/18   Vassie Loll, MD  JARDIANCE 10 MG TABS tablet Take 10 mg by mouth daily. 03/08/20   [provider]  lisinopril (PRINIVIL,ZESTRIL) 40 MG tablet Take 1 tablet (40 mg total) by mouth daily. 06/26/14   Bethann Berkshire, MD  metFORMIN (GLUCOPHAGE) 500 MG tablet Take 1,000 mg by mouth 2 (two) times daily. 05/18/19   [provider]  metoprolol succinate (TOPROL-XL) 100 MG 24 hr tablet Take 100 mg by mouth daily. 05/18/20   [provider]  omeprazole (PRILOSEC) 40 MG capsule Take 1 capsule (  40 mg total) by mouth daily. 02/28/21   Carlan, Chelsea L, NP  potassium chloride SA (KLOR-CON) 20 MEQ tablet Take 20 mEq by mouth daily.    [provider]      Allergies    Methylphenidate derivatives    Review of Systems   Review of Systems  Gastrointestinal:  Positive for abdominal pain.    Physical Exam Updated Vital Signs BP (!) 163/110 (BP Location: Right Arm)   Pulse (!) 103   Temp 99.6 F (37.6 C) (Oral)   Resp 17   SpO2 99%  Physical Exam Vitals and nursing note reviewed.  Constitutional:      General: He is not in acute distress.    Appearance: He is well-developed. He is obese.  HENT:      Head: Normocephalic and atraumatic.     Mouth/Throat:     Mouth: Mucous membranes are moist.  Eyes:     Extraocular Movements: Extraocular movements intact.     Conjunctiva/sclera: Conjunctivae normal.  Cardiovascular:     Rate and Rhythm: Normal rate and regular rhythm.     Heart sounds: No murmur heard. Pulmonary:     Effort: Pulmonary effort is normal. No respiratory distress.     Breath sounds: Normal breath sounds.  Abdominal:     Palpations: Abdomen is soft.     Tenderness: There is abdominal tenderness in the right lower quadrant and periumbilical area. There is no guarding or rebound.     Comments: Small amount of serosanguineous drainage noted from umbilicus  Musculoskeletal:        General: No swelling.     Cervical back: Neck supple.  Skin:    General: Skin is warm and dry.     Capillary Refill: Capillary refill takes less than 2 seconds.  Neurological:     General: No focal deficit present.     Mental Status: He is alert and oriented to person, place, and time.  Psychiatric:        Mood and Affect: Mood normal.        Behavior: Behavior normal.     ED Results / Procedures / Treatments   Labs (all labs ordered are listed, but only abnormal results are displayed) Labs Reviewed  LIPASE, BLOOD - Abnormal; Notable for the following components:      Result Value   Lipase 60 (*)    All other components within normal limits  COMPREHENSIVE METABOLIC PANEL - Abnormal; Notable for the following components:   Glucose, Bld 316 (*)    Total Bilirubin 1.7 (*)    All other components within normal limits  URINALYSIS, ROUTINE W REFLEX MICROSCOPIC - Abnormal; Notable for the following components:   Glucose, UA >=500 (*)    All other components within normal limits  CBC    EKG None  Radiology CT ABDOMEN PELVIS W CONTRAST  Result Date: 04/02/2023 CLINICAL DATA:  Right lower quadrant abdominal pain. EXAM: CT ABDOMEN AND PELVIS WITH CONTRAST TECHNIQUE: Multidetector  CT imaging of the abdomen and pelvis was performed using the standard protocol following bolus administration of intravenous contrast. RADIATION DOSE REDUCTION: This exam was performed according to the departmental dose-optimization program which includes automated exposure control, adjustment of the mA and/or kV according to patient size and/or use of iterative reconstruction technique. CONTRAST:  OMNIPAQUE IOHEXOL 300 MG/ML  SOLN COMPARISON:  CT abdomen pelvis dated September 28, 2020. FINDINGS: Lower chest: No acute abnormality. Hepatobiliary: No focal liver abnormality is seen. Status post cholecystectomy.  No biliary dilatation. Pancreas: Unremarkable. No pancreatic ductal dilatation or surrounding inflammatory changes. Spleen: No splenic injury or perisplenic hematoma. Adrenals/Urinary Tract: Adrenal glands are unremarkable. Three punctate right renal calculi. Single punctate left renal calculus. No ureteral calculi or hydronephrosis. The bladder is unremarkable. Stomach/Bowel: Stomach is within normal limits. Appendix appears normal. No evidence of bowel wall thickening, distention, or inflammatory changes. Vascular/Lymphatic: No significant vascular findings are present. No enlarged abdominal or pelvic lymph nodes. Reproductive: Prostate is unremarkable. Other: No free fluid or pneumoperitoneum. Musculoskeletal: No acute or significant osseous findings. Chronic bilateral L5 pars defects again noted. No significant listhesis. IMPRESSION: 1. No acute intra-abdominal process. Normal appendix. 2. Bilateral nonobstructive nephrolithiasis. Electronically Signed   By: Obie Dredge M.D.   On: 04/02/2023 15:35    Procedures Procedures    Medications Ordered in ED Medications  ondansetron (ZOFRAN) injection 4 mg (4 mg Intravenous Given 04/02/23 1603)  lactated ringers bolus 1,000 mL (0 mLs Intravenous Stopped 04/02/23 1411)  iohexol (OMNIPAQUE) 300 MG/ML solution 100 mL (100 mLs Intravenous Contrast Given  04/02/23 1336)  morphine (PF) 4 MG/ML injection 4 mg (4 mg Intravenous Given 04/02/23 1602)    ED Course/ Medical Decision Making/ A&P                                 Medical Decision Making This patient presents to the ED for concern of abdominal pain and decreased appetite, drainage from bellybutton, this involves an extensive number of treatment options, and is a complaint that carries with it a high risk of complications and morbidity.  The differential diagnosis includes hernia, cellulitis, PUD, gastritis, gastroenteritis, appendicitis, cholecystitis, diverticulitis, DKA, nephrolithiasis, gastroparesis, other    Co morbidities that complicate the patient evaluation :   obesity, GERD   Additional history obtained:  Additional history obtained from EMR External records from outside source obtained and reviewed including notes, labs   Lab Tests:  I Ordered, and personally interpreted labs.  The pertinent results include: Glucose elevated at 316, lipase elevated at 60 but this is around his baseline, negative UA   Imaging Studies ordered:  I ordered imaging studies including ET abdomen pelvis which shows a fat-containing umbilical hernia I independently visualized and interpreted imaging within scope of identifying emergent findings  I agree with the radiologist interpretation    Problem List / ED Course / Critical interventions / Medication management  Abdominal pain-patient's been having periumbilical abdominal pain and decreased appetite.  He is well-appearing and well-hydrated, able to eat and drink but just not having as much of an appetite as usual.  Having drainage from his umbilicus as well.  CT showed a small umbilical hernia, I discussed findings with radiologist who said it would appear stable, no signs of infection grossly, no signs of fistula or strangulation.  Given a clinical drainage and limited exam due to his body habitus will empirically treat with Keflex.   Advised on close follow-up and return precautions.  He also stated he was spitting some blood on his mouth today, he is not vomiting up he is not having black or bloody stools and hemoglobin is stable.  Discussed with him if he has any hematemesis, hematochezia or melena, starts having hemoptysis or any other complaints he is to come to the ER he had no bleeding in the ER, no thrombocytopenia, is not on blood thinners.  On exam seems to be related primarily to his umbilical area  seems to have an infection.  He was given strict return precautions. I ordered medication including morphine for pain Reevaluation of the patient after these medicines showed that the patient improved I have reviewed the patients home medicines and have made adjustments as needed      Amount and/or Complexity of Data Reviewed Labs: ordered. Radiology: ordered.  Risk Prescription drug management.           Final Clinical Impression(s) / ED Diagnoses Final diagnoses:  Periumbilical abdominal pain  Cellulitis of umbilicus    Rx / DC Orders ED Discharge Orders          Ordered    cephALEXin (KEFLEX) 500 MG capsule  4 times daily        04/02/23 [address], PA-C 04/02/23 [address], PA-C 04/02/23 1706    Eber Hong, MD 04/05/23 [phone]

## 2023-04-02 NOTE — Discharge Instructions (Addendum)
The pleasure taking care of you today.  Your blood sugar was elevated at 316.  Make sure you are drinking plenty of water, avoid simple carbohydrates such as sugary drinks, white bread and pasta, cookies/cakes/pastries.  Follow close with your primary care doctor regarding this to get better control of your blood sugars.  You are having drainage from your bellybutton, this is felt to be due to an infection.  We are putting you on an antibiotic.  Follow-up closely with your primary care doctor and come back to the ER if you get worse.  Your CT scan was otherwise reassuring.  You have a small hernia of your bellybutton but this looks unchanged from previous.

## 2023-04-02 NOTE — ED Triage Notes (Signed)
Pt reports after receiving a tetanus shot 1 month ago he has lost his appetite and it has "now turned into abdominal pain." Pt reports the pain is in the RLQ at this time. Denis fevers and vomiting. Pt reports spitting up blood.

## 2023-04-09 DIAGNOSIS — D751 Secondary polycythemia: Secondary | ICD-10-CM | POA: Diagnosis not present

## 2023-04-22 ENCOUNTER — Encounter (HOSPITAL_COMMUNITY): Payer: Self-pay | Admitting: Emergency Medicine

## 2023-04-22 ENCOUNTER — Other Ambulatory Visit: Payer: Self-pay

## 2023-04-22 ENCOUNTER — Emergency Department (HOSPITAL_COMMUNITY): Payer: Medicare Other

## 2023-04-22 ENCOUNTER — Emergency Department (HOSPITAL_COMMUNITY)
Admission: EM | Admit: 2023-04-22 | Discharge: 2023-04-22 | Disposition: A | Discharge status: Home / Self Care | Payer: Medicare Other | Attending: Emergency Medicine | Admitting: Emergency Medicine

## 2023-04-22 DIAGNOSIS — N200 Calculus of kidney: Secondary | ICD-10-CM | POA: Diagnosis not present

## 2023-04-22 DIAGNOSIS — R3129 Other microscopic hematuria: Secondary | ICD-10-CM

## 2023-04-22 DIAGNOSIS — Z9049 Acquired absence of other specified parts of digestive tract: Secondary | ICD-10-CM | POA: Diagnosis not present

## 2023-04-22 DIAGNOSIS — R1010 Upper abdominal pain, unspecified: Secondary | ICD-10-CM

## 2023-04-22 DIAGNOSIS — R109 Unspecified abdominal pain: Secondary | ICD-10-CM | POA: Diagnosis not present

## 2023-04-22 DIAGNOSIS — R1011 Right upper quadrant pain: Secondary | ICD-10-CM | POA: Insufficient documentation

## 2023-04-22 DIAGNOSIS — R319 Hematuria, unspecified: Secondary | ICD-10-CM | POA: Insufficient documentation

## 2023-04-22 DIAGNOSIS — K5730 Diverticulosis of large intestine without perforation or abscess without bleeding: Secondary | ICD-10-CM | POA: Insufficient documentation

## 2023-04-22 DIAGNOSIS — F1290 Cannabis use, unspecified, uncomplicated: Secondary | ICD-10-CM | POA: Insufficient documentation

## 2023-04-22 LAB — RAPID URINE DRUG SCREEN, HOSP PERFORMED
Amphetamines: NOT DETECTED
Barbiturates: NOT DETECTED
Benzodiazepines: NOT DETECTED
Cocaine: NOT DETECTED
Opiates: NOT DETECTED
Tetrahydrocannabinol: POSITIVE — AB

## 2023-04-22 LAB — CBC
HCT: 51.7 % (ref 39.0–52.0)
Hemoglobin: 18.1 g/dL — ABNORMAL HIGH (ref 13.0–17.0)
MCH: 30.7 pg (ref 26.0–34.0)
MCHC: 35 g/dL (ref 30.0–36.0)
MCV: 87.6 fL (ref 80.0–100.0)
Platelets: 291 10*3/uL (ref 150–400)
RBC: 5.9 MIL/uL — ABNORMAL HIGH (ref 4.22–5.81)
RDW: 12.8 % (ref 11.5–15.5)
WBC: 9.1 10*3/uL (ref 4.0–10.5)
nRBC: 0 % (ref 0.0–0.2)

## 2023-04-22 LAB — COMPREHENSIVE METABOLIC PANEL
ALT: 32 U/L (ref 0–44)
AST: 23 U/L (ref 15–41)
Albumin: 4.5 g/dL (ref 3.5–5.0)
Alkaline Phosphatase: 61 U/L (ref 38–126)
Anion gap: 11 (ref 5–15)
BUN: 17 mg/dL (ref 6–20)
CO2: 26 mmol/L (ref 22–32)
Calcium: 9.4 mg/dL (ref 8.9–10.3)
Chloride: 96 mmol/L — ABNORMAL LOW (ref 98–111)
Creatinine, Ser: 1.01 mg/dL (ref 0.61–1.24)
GFR, Estimated: >60 mL/min (ref >60)
Glucose, Bld: 138 mg/dL — ABNORMAL HIGH (ref 70–99)
Potassium: 3.7 mmol/L (ref 3.5–5.1)
Sodium: 133 mmol/L — ABNORMAL LOW (ref 135–145)
Total Bilirubin: 1.6 mg/dL — ABNORMAL HIGH (ref 0.3–1.2)
Total Protein: 7.9 g/dL (ref 6.5–8.1)

## 2023-04-22 LAB — URINALYSIS, ROUTINE W REFLEX MICROSCOPIC
Bacteria, UA: NONE SEEN
Bilirubin Urine: NEGATIVE
Glucose, UA: >=500 mg/dL — AB
Ketones, ur: 5 mg/dL — AB
Leukocytes,Ua: NEGATIVE
Nitrite: NEGATIVE
Protein, ur: NEGATIVE mg/dL
RBC / HPF: >50 RBC/hpf (ref 0–5)
Specific Gravity, Urine: 1.026 (ref 1.005–1.030)
pH: 6 (ref 5.0–8.0)

## 2023-04-22 LAB — LIPASE, BLOOD: Lipase: 46 U/L (ref 11–51)

## 2023-04-22 MED ORDER — ACETAMINOPHEN 500 MG PO TABS
1000.0000 mg | ORAL_TABLET | Freq: Once | ORAL | Status: AC
  Administered 2023-04-22: 1000 mg via ORAL
  Filled 2023-04-22: qty 2

## 2023-04-22 MED ORDER — FAMOTIDINE 20 MG PO TABS
20.0000 mg | ORAL_TABLET | Freq: Once | ORAL | Status: AC
  Administered 2023-04-22: 20 mg via ORAL
  Filled 2023-04-22: qty 1

## 2023-04-22 MED ORDER — TRAMADOL HCL 50 MG PO TABS
50.0000 mg | ORAL_TABLET | Freq: Once | ORAL | Status: AC
  Administered 2023-04-22: 50 mg via ORAL
  Filled 2023-04-22: qty 1

## 2023-04-22 MED ORDER — ALUM & MAG HYDROXIDE-SIMETH 200-200-20 MG/5ML PO SUSP
30.0000 mL | Freq: Once | ORAL | Status: AC
  Administered 2023-04-22: 30 mL via ORAL
  Filled 2023-04-22: qty 30

## 2023-04-22 NOTE — ED Provider Notes (Signed)
[place] EMERGENCY DEPARTMENT AT [facility] Provider Note   CSN: [id number] Arrival date & time: 04/22/23  4696     History  Chief Complaint  Patient presents with   Abdominal Pain    NOAHH BAUSMAN is a 37 y.o. male.  Pt with right upper abd/flank crampy discomfort in past day. Non radiating. No specific exacerbating or alleviating factors. Denies hx pancreatitis or pud. Remote hx cholecystectomy. No fever or chills. Having normal bms. No chest pain or discomfort. No sob. Hx kidney stones.   The history is provided by the patient and medical records.  Abdominal Pain Associated symptoms: no chest pain, no chills, no cough, no diarrhea, no dysuria, no fever, no shortness of breath, no sore throat and no vomiting        Home Medications Prior to Admission medications   Medication Sig Start Date End Date Taking? Authorizing Provider  amLODipine (NORVASC) 10 MG tablet Take 1 tablet (10 mg total) by mouth daily. 11/25/18   Vassie Loll, MD  aspirin-acetaminophen-caffeine (EXCEDRIN MIGRAINE) [phone] MG tablet Take 2 tablets by mouth every 6 (six) hours as needed for headache.    [provider]  atorvastatin (LIPITOR) 10 MG tablet Take by mouth. 05/08/20   [provider]  blood glucose meter kit and supplies Dispense based on patient and insurance preference. Use up to four times daily as directed. (FOR ICD-10 E10.9, E11.9). 11/24/18   Vassie Loll, MD  cephALEXin (KEFLEX) 500 MG capsule Take 1 capsule (500 mg total) by mouth 4 (four) times daily. 04/02/23   Carmel Sacramento A, PA-C  chlorthalidone (HYGROTON) 50 MG tablet Take 50 mg by mouth daily. 05/18/19   [provider]  dicyclomine (BENTYL) 20 MG tablet Take 20 mg by mouth daily.    [provider]  esomeprazole (NEXIUM) 40 MG capsule Take 1 capsule (40 mg total) by mouth daily. 02/28/21   Carlan, Chelsea L, NP  gemfibrozil (LOPID) 600 MG tablet Take 600 mg by mouth 2 (two) times  daily. 05/25/19   [provider]  Insulin Glargine (BASAGLAR KWIKPEN) 100 UNIT/ML Inject 15 Units into the skin daily.    [provider]  Insulin Pen Needle (ABOUTTIME PEN NEEDLE) 31G X 8 MM MISC Use as needed to inject insulin daily as instructed 11/24/18   Vassie Loll, MD  JARDIANCE 10 MG TABS tablet Take 10 mg by mouth daily. 03/08/20   [provider]  lisinopril (PRINIVIL,ZESTRIL) 40 MG tablet Take 1 tablet (40 mg total) by mouth daily. 06/26/14   Bethann Berkshire, MD  metFORMIN (GLUCOPHAGE) 500 MG tablet Take 1,000 mg by mouth 2 (two) times daily. 05/18/19   [provider]  metoprolol succinate (TOPROL-XL) 100 MG 24 hr tablet Take 100 mg by mouth daily. 05/18/20   [provider]  omeprazole (PRILOSEC) 40 MG capsule Take 1 capsule (40 mg total) by mouth daily. 02/28/21   Carlan, Chelsea L, NP  potassium chloride SA (KLOR-CON) 20 MEQ tablet Take 20 mEq by mouth daily.    [provider]      Allergies    Methylphenidate derivatives    Review of Systems   Review of Systems  Constitutional:  Negative for chills and fever.  HENT:  Negative for sore throat.   Eyes:  Negative for redness.  Respiratory:  Negative for cough and shortness of breath.   Cardiovascular:  Negative for chest pain.  Gastrointestinal:  Positive for abdominal pain. Negative for diarrhea and vomiting.  Genitourinary:  Positive for flank pain. Negative for dysuria, scrotal swelling and testicular pain.  Musculoskeletal:  Negative for back pain and neck pain.  Skin:  Negative for rash.  Neurological:  Negative for headaches.    Physical Exam Updated Vital Signs BP 116/81   Pulse 67   Temp 98.8 F (37.1 C) (Oral)   Resp 18   Ht 1.803 m (5\' 11" )   Wt (!) 147.4 kg   SpO2 93%   BMI 45.33 kg/m  Physical Exam Vitals and nursing note reviewed.  Constitutional:      Appearance: Normal appearance. He is well-developed.  HENT:     Head: Atraumatic.     Nose:  Nose normal.     Mouth/Throat:     Mouth: Mucous membranes are moist.  Eyes:     General: No scleral icterus.    Conjunctiva/sclera: Conjunctivae normal.  Neck:     Trachea: No tracheal deviation.  Cardiovascular:     Rate and Rhythm: Normal rate and regular rhythm.     Pulses: Normal pulses.     Heart sounds: Normal heart sounds. No murmur heard.    No friction rub. No gallop.  Pulmonary:     Effort: Pulmonary effort is normal. No accessory muscle usage or respiratory distress.     Breath sounds: Normal breath sounds.  Abdominal:     General: Bowel sounds are normal. There is no distension.     Palpations: Abdomen is soft. There is no mass.     Tenderness: There is no abdominal tenderness. There is no guarding or rebound.     Hernia: No hernia is present.     Comments: obese  Genitourinary:    Comments: No cva tenderness. Musculoskeletal:        General: No swelling.     Cervical back: Normal range of motion and neck supple. No rigidity.  Skin:    General: Skin is warm and dry.     Findings: No rash.  Neurological:     Mental Status: He is alert.     Comments: Alert, speech clear.   Psychiatric:        Mood and Affect: Mood normal.     ED Results / Procedures / Treatments   Labs (all labs ordered are listed, but only abnormal results are displayed) Results for orders placed or performed during the hospital encounter of 04/22/23  Lipase, blood  Result Value Ref Range   Lipase 46 11 - 51 U/L  Comprehensive metabolic panel  Result Value Ref Range   Sodium 133 (L) 135 - 145 mmol/L   Potassium 3.7 3.5 - 5.1 mmol/L   Chloride 96 (L) 98 - 111 mmol/L   CO2 26 22 - 32 mmol/L   Glucose, Bld 138 (H) 70 - 99 mg/dL   BUN 17 6 - 20 mg/dL   Creatinine, Ser 1.61 0.61 - 1.24 mg/dL   Calcium 9.4 8.9 - 09.6 mg/dL   Total Protein 7.9 6.5 - 8.1 g/dL   Albumin 4.5 3.5 - 5.0 g/dL   AST 23 15 - 41 U/L   ALT 32 0 - 44 U/L   Alkaline Phosphatase 61 38 - 126 U/L   Total Bilirubin  1.6 (H) 0.3 - 1.2 mg/dL   GFR, Estimated >04 >54 mL/min   Anion gap 11 5 - 15  CBC  Result Value Ref Range   WBC 9.1 4.0 - 10.5 K/uL   RBC 5.90 (H) 4.22 - 5.81 MIL/uL   Hemoglobin 18.1 (  H) 13.0 - 17.0 g/dL   HCT 74.2 59.5 - 63.8 %   MCV 87.6 80.0 - 100.0 fL   MCH 30.7 26.0 - 34.0 pg   MCHC 35.0 30.0 - 36.0 g/dL   RDW 75.6 43.3 - 29.5 %   Platelets 291 150 - 400 K/uL   nRBC 0.0 0.0 - 0.2 %  Urinalysis, Routine w reflex microscopic -Urine, Clean Catch  Result Value Ref Range   Color, Urine YELLOW YELLOW   APPearance CLEAR CLEAR   Specific Gravity, Urine 1.026 1.005 - 1.030   pH 6.0 5.0 - 8.0   Glucose, UA >=500 (A) NEGATIVE mg/dL   Hgb urine dipstick LARGE (A) NEGATIVE   Bilirubin Urine NEGATIVE NEGATIVE   Ketones, ur 5 (A) NEGATIVE mg/dL   Protein, ur NEGATIVE NEGATIVE mg/dL   Nitrite NEGATIVE NEGATIVE   Leukocytes,Ua NEGATIVE NEGATIVE   RBC / HPF >50 0 - 5 RBC/hpf   WBC, UA 0-5 0 - 5 WBC/hpf   Bacteria, UA NONE SEEN NONE SEEN   Squamous Epithelial / HPF 0-5 0 - 5 /HPF   Mucus PRESENT   Rapid urine drug screen (hospital performed)  Result Value Ref Range   Opiates NONE DETECTED NONE DETECTED   Cocaine NONE DETECTED NONE DETECTED   Benzodiazepines NONE DETECTED NONE DETECTED   Amphetamines NONE DETECTED NONE DETECTED   Tetrahydrocannabinol POSITIVE (A) NONE DETECTED   Barbiturates NONE DETECTED NONE DETECTED   CT Renal Stone Study  Result Date: 04/22/2023 CLINICAL DATA:  Right-sided flank pain. EXAM: CT ABDOMEN AND PELVIS WITHOUT CONTRAST TECHNIQUE: Multidetector CT imaging of the abdomen and pelvis was performed following the standard protocol without IV contrast. RADIATION DOSE REDUCTION: This exam was performed according to the departmental dose-optimization program which includes automated exposure control, adjustment of the mA and/or kV according to patient size and/or use of iterative reconstruction technique. COMPARISON:  CT abdomen and pelvis 04/02/2023 FINDINGS:  Lower chest: No acute abnormality. Hepatobiliary: No focal liver abnormality is seen. Status post cholecystectomy. No biliary dilatation. Pancreas: Unremarkable. No pancreatic ductal dilatation or surrounding inflammatory changes. Spleen: Normal in size without focal abnormality. Adrenals/Urinary Tract: There are punctate bilateral renal calculi. There is no hydronephrosis. The bladder and adrenal glands are within normal limits. Stomach/Bowel: Stomach is within normal limits. Appendix appears normal. No evidence of bowel wall thickening, distention, or inflammatory changes. There are few scattered colonic diverticula. Vascular/Lymphatic: No significant vascular findings are present. No enlarged abdominal or pelvic lymph nodes. Reproductive: Prostate is unremarkable. Other: No abdominal wall hernia or abnormality. No abdominopelvic ascites. Musculoskeletal: No acute or significant osseous findings. IMPRESSION: 1. Nonobstructing bilateral renal calculi. 2. Mild colonic diverticulosis without evidence for diverticulitis. Electronically Signed   By: Darliss Cheney M.D.   On: 04/22/2023 23:08   CT ABDOMEN PELVIS W CONTRAST  Result Date: 04/02/2023 CLINICAL DATA:  Right lower quadrant abdominal pain. EXAM: CT ABDOMEN AND PELVIS WITH CONTRAST TECHNIQUE: Multidetector CT imaging of the abdomen and pelvis was performed using the standard protocol following bolus administration of intravenous contrast. RADIATION DOSE REDUCTION: This exam was performed according to the departmental dose-optimization program which includes automated exposure control, adjustment of the mA and/or kV according to patient size and/or use of iterative reconstruction technique. CONTRAST:  OMNIPAQUE IOHEXOL 300 MG/ML  SOLN COMPARISON:  CT abdomen pelvis dated September 28, 2020. FINDINGS: Lower chest: No acute abnormality. Hepatobiliary: No focal liver abnormality is seen. Status post cholecystectomy. No biliary dilatation. Pancreas: Unremarkable.  No pancreatic ductal dilatation or  surrounding inflammatory changes. Spleen: No splenic injury or perisplenic hematoma. Adrenals/Urinary Tract: Adrenal glands are unremarkable. Three punctate right renal calculi. Single punctate left renal calculus. No ureteral calculi or hydronephrosis. The bladder is unremarkable. Stomach/Bowel: Stomach is within normal limits. Appendix appears normal. No evidence of bowel wall thickening, distention, or inflammatory changes. Vascular/Lymphatic: No significant vascular findings are present. No enlarged abdominal or pelvic lymph nodes. Reproductive: Prostate is unremarkable. Other: No free fluid or pneumoperitoneum. Musculoskeletal: No acute or significant osseous findings. Chronic bilateral L5 pars defects again noted. No significant listhesis. IMPRESSION: 1. No acute intra-abdominal process. Normal appendix. 2. Bilateral nonobstructive nephrolithiasis. Electronically Signed   By: Obie Dredge M.D.   On: 04/02/2023 15:35     EKG None  Radiology CT Renal Stone Study  Result Date: 04/22/2023 CLINICAL DATA:  Right-sided flank pain. EXAM: CT ABDOMEN AND PELVIS WITHOUT CONTRAST TECHNIQUE: Multidetector CT imaging of the abdomen and pelvis was performed following the standard protocol without IV contrast. RADIATION DOSE REDUCTION: This exam was performed according to the departmental dose-optimization program which includes automated exposure control, adjustment of the mA and/or kV according to patient size and/or use of iterative reconstruction technique. COMPARISON:  CT abdomen and pelvis 04/02/2023 FINDINGS: Lower chest: No acute abnormality. Hepatobiliary: No focal liver abnormality is seen. Status post cholecystectomy. No biliary dilatation. Pancreas: Unremarkable. No pancreatic ductal dilatation or surrounding inflammatory changes. Spleen: Normal in size without focal abnormality. Adrenals/Urinary Tract: There are punctate bilateral renal calculi. There is no  hydronephrosis. The bladder and adrenal glands are within normal limits. Stomach/Bowel: Stomach is within normal limits. Appendix appears normal. No evidence of bowel wall thickening, distention, or inflammatory changes. There are few scattered colonic diverticula. Vascular/Lymphatic: No significant vascular findings are present. No enlarged abdominal or pelvic lymph nodes. Reproductive: Prostate is unremarkable. Other: No abdominal wall hernia or abnormality. No abdominopelvic ascites. Musculoskeletal: No acute or significant osseous findings. IMPRESSION: 1. Nonobstructing bilateral renal calculi. 2. Mild colonic diverticulosis without evidence for diverticulitis. Electronically Signed   By: Darliss Cheney M.D.   On: 04/22/2023 23:08    Procedures Procedures    Medications Ordered in ED Medications  alum & mag hydroxide-simeth (MAALOX/MYLANTA) 200-200-20 MG/5ML suspension 30 mL (30 mLs Oral Given 04/22/23 2207)  famotidine (PEPCID) tablet 20 mg (20 mg Oral Given 04/22/23 2205)  acetaminophen (TYLENOL) tablet 1,000 mg (1,000 mg Oral Given 04/22/23 2205)    ED Course/ Medical Decision Making/ A&P                                 Medical Decision Making Problems Addressed: Bilateral kidney stones: chronic illness or injury Hematuria, microscopic: acute illness or injury Marijuana use: acute illness or injury Upper abdominal pain: acute illness or injury with systemic symptoms that poses a threat to life or bodily functions  Amount and/or Complexity of Data Reviewed External Data Reviewed: radiology and notes. Labs: ordered. Decision-making details documented in ED Course. Radiology: ordered and independent interpretation performed. Decision-making details documented in ED Course.  Risk OTC drugs. Prescription drug management. Decision regarding hospitalization.   Iv ns. Continuous pulse ox and cardiac monitoring. Labs ordered/sent. Imaging ordered.   Differential diagnosis includes pud,  abd cramping, renal colic, ureteral stone/obstruction, etc. Dispo decision including potential need for admission considered - will get labs and imaging and reassess.   Reviewed nursing notes and prior charts for additional history. External reports reviewed.   Cardiac monitor: sinus rhythm,  rate 94.  Labs reviewed/interpreted by me - lipase normal. Cr normal. Wbc and hct normal. Ua w many rbcs, ?possible ureteral stone. Will get renal ct.   CT reviewed/interpreted by me - no ureteral stone.   Ultram po. Po fluid.s  Recheck abd soft nt.   Given recurrent abd pain, several prior cts neg for acute process, ?whether possible cannabinoid hyperemesis syndrome.   Pt currently appears stable for d/c.   Rec close pcp f/u.  Return precautions provided.          Final Clinical Impression(s) / ED Diagnoses Final diagnoses:  None    Rx / DC Orders ED Discharge Orders     None         Cathren Laine, MD 04/22/23 2338

## 2023-04-22 NOTE — ED Triage Notes (Signed)
Pt complains of abd pain started in RUQ and right flank and moved to left lower quad. Pain has been going on for 1 month and was seen here on the 5th for similar. Nausea without vomited. Decreased appetite.

## 2023-04-22 NOTE — Discharge Instructions (Addendum)
It was our pleasure to provide your ER care today - we hope that you feel better.  Drink plenty of fluids/stay well hydrated.   Your ct scan shows small stones in your kidney, but no stones causing pain or an obstruction.  You urine  tests shows microscopic blood in urine - follow up with primary care doctor in the next 1-2 weeks for recheck.   Take acetaminophen or ibuprofen as need.   Note that increasingly we are seeing a recurrent abdominal pain and/or vomiting syndrome called Cannabinoid Hyperemesis Syndrome - see attached info - in these cases avoiding marijuana use will prevent symptoms from recurring in future (note that symptoms can persist for a few weeks if history of heavy marijuana use as it can take time to get out of system).   Return to ER right away  if worse, fevers, new symptoms, new or worsening or severe abdominal pain, persistent vomiting, chest pain, trouble breathing, or other emergency concern.  You were given pain meds in the ER - no driving for the next 6 hours.

## 2023-04-22 NOTE — ED Notes (Signed)
Patient transported to CT 

## 2023-04-23 DIAGNOSIS — D751 Secondary polycythemia: Secondary | ICD-10-CM | POA: Diagnosis not present

## 2023-04-23 DIAGNOSIS — G4733 Obstructive sleep apnea (adult) (pediatric): Secondary | ICD-10-CM | POA: Diagnosis not present

## 2023-04-23 DIAGNOSIS — Z6841 Body Mass Index (BMI) 40.0 and over, adult: Secondary | ICD-10-CM | POA: Diagnosis not present

## 2023-05-07 DIAGNOSIS — K219 Gastro-esophageal reflux disease without esophagitis: Secondary | ICD-10-CM | POA: Diagnosis not present

## 2023-05-07 DIAGNOSIS — E1142 Type 2 diabetes mellitus with diabetic polyneuropathy: Secondary | ICD-10-CM | POA: Diagnosis not present

## 2023-05-07 DIAGNOSIS — M5459 Other low back pain: Secondary | ICD-10-CM | POA: Diagnosis not present

## 2023-05-07 DIAGNOSIS — G4730 Sleep apnea, unspecified: Secondary | ICD-10-CM | POA: Diagnosis not present

## 2023-05-07 DIAGNOSIS — J309 Allergic rhinitis, unspecified: Secondary | ICD-10-CM | POA: Diagnosis not present

## 2023-05-07 DIAGNOSIS — K5900 Constipation, unspecified: Secondary | ICD-10-CM | POA: Diagnosis not present

## 2023-05-07 DIAGNOSIS — E662 Morbid (severe) obesity with alveolar hypoventilation: Secondary | ICD-10-CM | POA: Diagnosis not present

## 2023-05-07 DIAGNOSIS — I10 Essential (primary) hypertension: Secondary | ICD-10-CM | POA: Diagnosis not present

## 2023-05-31 DIAGNOSIS — E119 Type 2 diabetes mellitus without complications: Secondary | ICD-10-CM | POA: Diagnosis not present

## 2023-05-31 DIAGNOSIS — K2900 Acute gastritis without bleeding: Secondary | ICD-10-CM | POA: Diagnosis not present

## 2023-05-31 DIAGNOSIS — Z87891 Personal history of nicotine dependence: Secondary | ICD-10-CM | POA: Diagnosis not present

## 2023-05-31 DIAGNOSIS — K219 Gastro-esophageal reflux disease without esophagitis: Secondary | ICD-10-CM | POA: Diagnosis not present

## 2023-05-31 DIAGNOSIS — R000 Tachycardia, unspecified: Secondary | ICD-10-CM | POA: Diagnosis not present

## 2023-05-31 DIAGNOSIS — N200 Calculus of kidney: Secondary | ICD-10-CM | POA: Diagnosis not present

## 2023-05-31 DIAGNOSIS — I10 Essential (primary) hypertension: Secondary | ICD-10-CM | POA: Diagnosis not present

## 2023-05-31 DIAGNOSIS — Z794 Long term (current) use of insulin: Secondary | ICD-10-CM | POA: Diagnosis not present

## 2023-05-31 DIAGNOSIS — F909 Attention-deficit hyperactivity disorder, unspecified type: Secondary | ICD-10-CM | POA: Diagnosis not present

## 2023-05-31 DIAGNOSIS — Z79899 Other long term (current) drug therapy: Secondary | ICD-10-CM | POA: Diagnosis not present

## 2023-05-31 DIAGNOSIS — R0789 Other chest pain: Secondary | ICD-10-CM | POA: Diagnosis not present

## 2023-05-31 DIAGNOSIS — R1013 Epigastric pain: Secondary | ICD-10-CM | POA: Diagnosis not present

## 2023-05-31 DIAGNOSIS — Z7982 Long term (current) use of aspirin: Secondary | ICD-10-CM | POA: Diagnosis not present

## 2023-05-31 DIAGNOSIS — Z9049 Acquired absence of other specified parts of digestive tract: Secondary | ICD-10-CM | POA: Diagnosis not present

## 2023-05-31 DIAGNOSIS — F4310 Post-traumatic stress disorder, unspecified: Secondary | ICD-10-CM | POA: Diagnosis not present

## 2023-05-31 DIAGNOSIS — Z7984 Long term (current) use of oral hypoglycemic drugs: Secondary | ICD-10-CM | POA: Diagnosis not present

## 2023-05-31 DIAGNOSIS — R1084 Generalized abdominal pain: Secondary | ICD-10-CM | POA: Diagnosis not present

## 2023-05-31 DIAGNOSIS — R531 Weakness: Secondary | ICD-10-CM | POA: Diagnosis not present

## 2023-05-31 DIAGNOSIS — Z888 Allergy status to other drugs, medicaments and biological substances status: Secondary | ICD-10-CM | POA: Diagnosis not present

## 2023-05-31 DIAGNOSIS — F319 Bipolar disorder, unspecified: Secondary | ICD-10-CM | POA: Diagnosis not present

## 2023-05-31 DIAGNOSIS — R748 Abnormal levels of other serum enzymes: Secondary | ICD-10-CM | POA: Diagnosis not present

## 2023-05-31 DIAGNOSIS — G4733 Obstructive sleep apnea (adult) (pediatric): Secondary | ICD-10-CM | POA: Diagnosis not present

## 2023-05-31 DIAGNOSIS — R1110 Vomiting, unspecified: Secondary | ICD-10-CM | POA: Diagnosis not present

## 2023-05-31 DIAGNOSIS — K429 Umbilical hernia without obstruction or gangrene: Secondary | ICD-10-CM | POA: Diagnosis not present

## 2023-05-31 DIAGNOSIS — R1011 Right upper quadrant pain: Secondary | ICD-10-CM | POA: Diagnosis not present

## 2023-06-04 DIAGNOSIS — I10 Essential (primary) hypertension: Secondary | ICD-10-CM | POA: Diagnosis not present

## 2023-06-04 DIAGNOSIS — R1010 Upper abdominal pain, unspecified: Secondary | ICD-10-CM | POA: Diagnosis not present

## 2023-06-09 ENCOUNTER — Encounter (INDEPENDENT_AMBULATORY_CARE_PROVIDER_SITE_OTHER): Payer: Self-pay | Admitting: *Deleted

## 2023-07-02 ENCOUNTER — Ambulatory Visit (INDEPENDENT_AMBULATORY_CARE_PROVIDER_SITE_OTHER): Payer: Medicare Other | Admitting: Gastroenterology

## 2023-07-02 ENCOUNTER — Encounter (INDEPENDENT_AMBULATORY_CARE_PROVIDER_SITE_OTHER): Payer: Self-pay

## 2023-07-02 ENCOUNTER — Telehealth (INDEPENDENT_AMBULATORY_CARE_PROVIDER_SITE_OTHER): Payer: Self-pay

## 2023-07-02 ENCOUNTER — Encounter (INDEPENDENT_AMBULATORY_CARE_PROVIDER_SITE_OTHER): Payer: Self-pay | Admitting: Gastroenterology

## 2023-07-02 VITALS — BP 135/97 | HR 111 | Temp 98.6°F | Ht 71.0 in | Wt 310.8 lb

## 2023-07-02 DIAGNOSIS — R142 Eructation: Secondary | ICD-10-CM | POA: Diagnosis not present

## 2023-07-02 DIAGNOSIS — R109 Unspecified abdominal pain: Secondary | ICD-10-CM | POA: Diagnosis not present

## 2023-07-02 DIAGNOSIS — G8929 Other chronic pain: Secondary | ICD-10-CM

## 2023-07-02 DIAGNOSIS — R110 Nausea: Secondary | ICD-10-CM | POA: Insufficient documentation

## 2023-07-02 DIAGNOSIS — R140 Abdominal distension (gaseous): Secondary | ICD-10-CM | POA: Insufficient documentation

## 2023-07-02 DIAGNOSIS — R6881 Early satiety: Secondary | ICD-10-CM | POA: Insufficient documentation

## 2023-07-02 MED ORDER — ONDANSETRON HCL 4 MG PO TABS
4.0000 mg | ORAL_TABLET | Freq: Three times a day (TID) | ORAL | 1 refills | Status: DC | PRN
Start: 2023-07-02 — End: 2023-07-02

## 2023-07-02 MED ORDER — ONDANSETRON 4 MG PO TBDP
4.0000 mg | ORAL_TABLET | Freq: Three times a day (TID) | ORAL | 3 refills | Status: AC | PRN
Start: 2023-07-02 — End: ?

## 2023-07-02 MED ORDER — DICYCLOMINE HCL 10 MG PO CAPS
10.0000 mg | ORAL_CAPSULE | Freq: Two times a day (BID) | ORAL | 1 refills | Status: DC | PRN
Start: 2023-07-02 — End: 2023-09-25

## 2023-07-02 NOTE — Telephone Encounter (Signed)
Medication sent.

## 2023-07-02 NOTE — Progress Notes (Signed)
Kyle Collins, M.D. Gastroenterology & Hepatology [facility] [address] [place], [place] [address]  Primary Care Physician: Kyle Deiters, MD [address] [place] [address]  I will communicate my assessment and recommendations to the referring MD via EMR.  Problems: Recurrent right upper quadrant abdominal pain  History of Present Illness: Kyle Collins is a 37 y.o. male with past medical history of ADHD, anxiety, bipolar disorder, depression, diabetes c/b neuropathy, GERD, hypertension, sleep apnea, hyperlipidemia, who presents for evaluation of abdominal pain.  The patient was last seen on 02/28/2021. At that time, the patient was advised to use Tylenol instead of Excedrin for management of chronic pain issues.  Patient reports that for the last 2 months he has presented intermittent pain in his RUQ. He states that he has presented these episodes intermittently. States the pain has fluctuated in severity. He states that when he takes Zofran he has improvement of the pain. He has also felt the pain sometimes migrates to the right side of his flank. He also reported having burning pain in his mid abdomen.   Due to the severity pain, the patient went  the ER at [facility] on 05/31/2023.  Blood workup showed a CBC with hemoglobin of 17.3. Troponin was negative x 2 urinalysis showed some bacteria but no other abnormalities with urinary culture showing mixed flora, lipase was elevated 203 but less than 3 times the upper limit of normal, CMP with total bilirubin 2.0, AST 22, ALT 40, alkaline phosphatase 70.  Repeat CT of the abdomen and pelvis with IV contrast showed status postcholecystectomy and small bilateral renal calculi without obstruction.  He was told he may have had a kidney stone. States that he was given Zofran and GI cocktail which helped.  He is currently having recurrent pain in his R back area just posterior to the RUQ. He  has also presented some burning pain in the mesogastric area. He also reports having significant bloating in his abdomen. He feels sometimes his abdomen is distended after eating and he also has "sulfuric burps and vomiting".  The patient denies having any nausea, vomiting, fever, chills, hematochezia, melena, hematemesis, diarrhea, heartburn,  jaundice, pruritus. States that after he had a tetanus shot he had a change in his appetite - has lost 40+ lb since then, around 4 months.  As part of the evaluation of the abdominal pain the patient has undergone the following imaging.  CT of the abdomen and pelvis with contrast on 04/02/2023 which was only abnormal for nonobstructive nephrolithiasis.  Repeat CT of the abdomen and pelvis without contrast on 04/22/2023 showed presence of nonobstructive renal calculi and diverticulosis.  Notably, he has had multiple ultrasounds and CT of the abdomen and pelvis examinations at least since 2019 which have been unremarkable for any etiology of abdominal pain and nausea.   Patient is only taking extra strength Tylenol, has not taken any Excedrin in couple of months.  Last EGD: 12/14/2020, normal esophagus, normal stomach (normal biopsies with negative for H. pylori), duodenal erosions (normal biopsies) Last Colonoscopy:  Past Medical History: Past Medical History:  Diagnosis Date   ADHD (attention deficit hyperactivity disorder)    Anxiety    Bipolar disorder (HCC)    Depression    Diabetes mellitus    GERD (gastroesophageal reflux disease)    Headache    Hemochromatosis 2019   Hypertension    Polycythemia    Sleep apnea    Thyroid disease  Past Surgical History: Past Surgical History:  Procedure Laterality Date   BIOPSY  12/14/2020   Procedure: BIOPSY;  Surgeon: Kyle Frame, MD;  [facility];  Service: Gastroenterology;;   CHOLECYSTECTOMY N/A 11/24/2018   Procedure: LAPAROSCOPIC CHOLECYSTECTOMY;  Surgeon: Kyle Macho,  MD;  Location: [facility];  Service: General;  Laterality: N/A;   CIRCUMCISION N/A 09/14/2017   Procedure: CIRCUMCISION;  Surgeon: Kyle Gauze, MD;  Location: [facility];  Service: Urology;  Laterality: N/A;  1 HR 336 [phone] - He has a Child psychotherapist that needs to be called if changes made Kyle Collins @ [phone] MEDICARE-[id number] MEDICAID-[id number] N   ESOPHAGOGASTRODUODENOSCOPY (EGD) WITH PROPOFOL N/A 12/14/2020   Kyle Collins:Normal esophagus. z line irregular, normal stomach, duodenal erosions without bleeding, neg h pylori, biopsies unremarkable.   HERNIA REPAIR     scrotum   MAXILLARY ANTROSTOMY Right 08/18/2018   Procedure: ENDOSCOPIC MAXILLARY RIGHT ANTROSTOMY WITH TISSUE REMOVAL;  Surgeon: Kyle Pies, MD;  Location: [facility] OR;  Service: ENT;  Laterality: Right;   NASAL SEPTOPLASTY W/ TURBINOPLASTY Bilateral 08/18/2018   Procedure: NASAL SEPTOPLASTY WITH TURBINATE REDUCTION;  Surgeon: Kyle Pies, MD;  Location: [facility] OR;  Service: ENT;  Laterality: Bilateral;   NASAL SEPTUM SURGERY  08/18/2018   ORIF SHOULDER FRACTURE Right 02/28/2016   Procedure: OPEN REDUCTION INTERNAL FIXATION (ORIF) SHOULDER FRACTURE;  Surgeon: Vickki Hearing, MD;  Location: [facility];  Service: Orthopedics;  Laterality: Right;   SHOULDER CLOSED REDUCTION Right 12/04/2015   Procedure: CLOSED REDUCTION SHOULDER;  Surgeon: Vickki Hearing, MD;  Location: [facility];  Service: Orthopedics;  Laterality: Right;   TOOTH EXTRACTION  spring 2016   top left     Family History: Family History  Problem Relation Age of Onset   Diabetes Mother    Diabetes Father     Social History: Social History   Tobacco Use  Smoking Status Former   Current packs/day: 0.00   Average packs/day: 0.5 packs/day for 5.0 years (2.5 ttl pk-yrs)   Types: E-cigarettes, Cigarettes   Start date: 02/25/2004   Quit date: 02/24/2009   Years since quitting: 14.3  Smokeless Tobacco Never   Social History   Substance and Sexual Activity  Alcohol Use  Not Currently   Comment: once year   Social History   Substance and Sexual Activity  Drug Use No   Comment: Hemp and cbd    Allergies: Allergies  Allergen Reactions   Methylphenidate Derivatives Other (See Comments)    Hallucinations     Medications: Current Outpatient Medications  Medication Sig Dispense Refill   amLODipine (NORVASC) 10 MG tablet Take 1 tablet (10 mg total) by mouth daily. 30 tablet 1   atorvastatin (LIPITOR) 10 MG tablet Take by mouth.     chlorthalidone (HYGROTON) 50 MG tablet Take 50 mg by mouth daily.     dicyclomine (BENTYL) 20 MG tablet Take 20 mg by mouth daily.     famotidine (PEPCID) 20 MG tablet Take 20 mg by mouth 2 (two) times daily.     gemfibrozil (LOPID) 600 MG tablet Take 600 mg by mouth 2 (two) times daily.     Insulin Glargine (BASAGLAR KWIKPEN) 100 UNIT/ML Inject 15 Units into the skin daily.     Insulin Pen Needle (ABOUTTIME PEN NEEDLE) 31G X 8 MM MISC Use as needed to inject insulin daily as instructed 150 each 1   JARDIANCE 10 MG TABS tablet Take 10 mg by mouth daily.     lisinopril (PRINIVIL,ZESTRIL) 40 MG  tablet Take 1 tablet (40 mg total) by mouth daily. 30 tablet 1   metFORMIN (GLUCOPHAGE) 500 MG tablet Take 1,000 mg by mouth 2 (two) times daily.     metoprolol succinate (TOPROL-XL) 100 MG 24 hr tablet Take 100 mg by mouth daily.     potassium chloride SA (KLOR-CON) 20 MEQ tablet Take 20 mEq by mouth daily.     omeprazole (PRILOSEC) 40 MG capsule Take 1 capsule (40 mg total) by mouth daily. (Patient not taking: Reported on 07/02/2023) 90 capsule 3   No current facility-administered medications for this visit.    Review of Systems: GENERAL: negative for malaise, night sweats HEENT: No changes in hearing or vision, no nose bleeds or other nasal problems. NECK: Negative for lumps, goiter, pain and significant neck swelling RESPIRATORY: Negative for cough, wheezing CARDIOVASCULAR: Negative for chest pain, leg swelling, palpitations,  orthopnea GI: SEE HPI MUSCULOSKELETAL: Negative for joint pain or swelling, back pain, and muscle pain. SKIN: Negative for lesions, rash PSYCH: Negative for sleep disturbance, mood disorder and recent psychosocial stressors. HEMATOLOGY Negative for prolonged bleeding, bruising easily, and swollen nodes. ENDOCRINE: Negative for cold or heat intolerance, polyuria, polydipsia and goiter. NEURO: negative for tremor, gait imbalance, syncope and seizures. The remainder of the review of systems is noncontributory.   Physical Exam: BP (!) 135/97 (BP Location: Left Arm, Patient Position: Sitting, Cuff Size: Large)   Pulse (!) 111   Temp 98.6 F (37 C) (Temporal)   Ht 5\' 11"  (1.803 m)   Wt (!) 310 lb 12.8 oz (141 kg)   BMI 43.35 kg/m  GENERAL: The patient is AO x3, in no acute distress. HEENT: Head is normocephalic and atraumatic. EOMI are intact. Mouth is well hydrated and without lesions. NECK: Supple. No masses LUNGS: Clear to auscultation. No presence of rhonchi/wheezing/rales. Adequate chest expansion HEART: RRR, normal s1 and s2. ABDOMEN: Soft, nontender, no guarding, no peritoneal signs, and nondistended. BS +. No masses. RECTAL EXAM: no external lesions, normal tone, no masses, brown stool without blood.  02/28/2021 chaperone: EXTREMITIES: Without any cyanosis, clubbing, rash, lesions or edema. NEUROLOGIC: AOx3, no focal motor deficit. SKIN: no jaundice, no rashes  Imaging/Labs: as above  I personally reviewed and interpreted the available labs, imaging and endoscopic files.  Impression and Plan: EVYN TORBECK is a 37 y.o. male with past medical history of ADHD, anxiety, bipolar disorder, depression, diabetes c/b neuropathy, GERD, hypertension, sleep apnea, hyperlipidemia, who presents for evaluation of abdominal pain. Patient has presented recurrent episodes of abdominal pain with most recent presenting for the last couple months.  Has had multiple imaging modalities without a clear  etiology of his symptoms.  We will explore this again with an EGD.  Notably he has presented some foul-smelling burping.  Given his history of diabetes and neuropathy, it is possible he may be presenting some gastroparesis symptoms and hypersensitivity, we will investigate this with a gastric imaging study if his EGD is unremarkable.  For now he can take Bentyl and Zofran as needed to relieve his symptoms.  -Schedule EGD -Start Bentyl 1 tablet q12h as needed for abdominal pain -Start Zofran 4 mg q8h as needed for nausea -If negative endoscopic findings, we will proceed with a gastric emptying study  All questions were answered.      Kyle Blazing, MD Gastroenterology and Hepatology [facility]ology

## 2023-07-02 NOTE — Telephone Encounter (Signed)
Patient would like for [place] to send in the sublingual Zofran instead of the tablet, to [facility] [employer] [facility].

## 2023-07-02 NOTE — Patient Instructions (Addendum)
Schedule EGD Start Bentyl 1 tablet q12h as needed for abdominal pain Start Zofran 4 mg q8h as needed for nausea If negative endoscopic findings, we will proceed with a gastric emptying study

## 2023-07-03 ENCOUNTER — Encounter (INDEPENDENT_AMBULATORY_CARE_PROVIDER_SITE_OTHER): Payer: Self-pay

## 2023-07-03 ENCOUNTER — Telehealth (INDEPENDENT_AMBULATORY_CARE_PROVIDER_SITE_OTHER): Payer: Self-pay | Admitting: Gastroenterology

## 2023-07-03 NOTE — Telephone Encounter (Signed)
error 

## 2023-07-03 NOTE — Telephone Encounter (Signed)
Left message for pt to return call to schedule EGD Levon Hedger ASA 3)

## 2023-07-03 NOTE — Telephone Encounter (Signed)
Pt returned call and scheduled for 07/23/23 at 2:30pm. Pt states this day may work with social services but he would have to call them to see.  Marcelino Duster, pt caseworker, called and stated that 07/23/23 would not work due to her being out of the office. We have rescheduled pt to 08/04/23 at 7:30am. Kandis Mannan that pt would have to complete a pre op week before. Marcelino Duster states that her co worker may be able to assist him while she is out. Instructions will be mailed to patient once pre op is received. Will also call Marcelino Duster with pre op. Marcelino Duster ask to be called on work Cell [phone].

## 2023-07-10 DIAGNOSIS — M5432 Sciatica, left side: Secondary | ICD-10-CM | POA: Diagnosis not present

## 2023-07-10 DIAGNOSIS — Z79891 Long term (current) use of opiate analgesic: Secondary | ICD-10-CM | POA: Diagnosis not present

## 2023-07-10 DIAGNOSIS — M545 Low back pain, unspecified: Secondary | ICD-10-CM | POA: Diagnosis not present

## 2023-07-10 DIAGNOSIS — M79629 Pain in unspecified upper arm: Secondary | ICD-10-CM | POA: Diagnosis not present

## 2023-07-10 DIAGNOSIS — G894 Chronic pain syndrome: Secondary | ICD-10-CM | POA: Diagnosis not present

## 2023-07-10 DIAGNOSIS — Q7123 Congenital absence of both forearm and hand, bilateral: Secondary | ICD-10-CM | POA: Diagnosis not present

## 2023-07-20 ENCOUNTER — Other Ambulatory Visit (HOSPITAL_COMMUNITY): Payer: Medicare Other

## 2023-08-14 ENCOUNTER — Encounter (HOSPITAL_COMMUNITY)
Admission: RE | Admit: 2023-08-14 | Discharge: 2023-08-14 | Disposition: A | Discharge status: Home / Self Care | Payer: Medicare Other | Source: Ambulatory Visit | Attending: Gastroenterology | Admitting: Gastroenterology

## 2023-08-14 ENCOUNTER — Encounter (HOSPITAL_COMMUNITY): Payer: Self-pay

## 2023-08-14 VITALS — BP 150/88 | HR 87 | Temp 97.7°F | Resp 18 | Ht 71.0 in | Wt 310.8 lb

## 2023-08-14 DIAGNOSIS — I10 Essential (primary) hypertension: Secondary | ICD-10-CM | POA: Diagnosis not present

## 2023-08-14 DIAGNOSIS — Z01810 Encounter for preprocedural cardiovascular examination: Secondary | ICD-10-CM | POA: Diagnosis not present

## 2023-08-18 ENCOUNTER — Ambulatory Visit (HOSPITAL_COMMUNITY)
Admission: RE | Admit: 2023-08-18 | Discharge: 2023-08-18 | Disposition: A | Discharge status: Home / Self Care | Payer: Medicare Other | Attending: Gastroenterology | Admitting: Gastroenterology

## 2023-08-18 ENCOUNTER — Encounter (HOSPITAL_COMMUNITY)
Admission: RE | Disposition: A | Discharge status: Home / Self Care | Payer: Self-pay | Source: Home / Self Care | Attending: Gastroenterology

## 2023-08-18 ENCOUNTER — Encounter (HOSPITAL_COMMUNITY): Payer: Self-pay | Admitting: Gastroenterology

## 2023-08-18 ENCOUNTER — Ambulatory Visit (HOSPITAL_COMMUNITY): Payer: Self-pay | Admitting: Anesthesiology

## 2023-08-18 ENCOUNTER — Other Ambulatory Visit: Payer: Self-pay

## 2023-08-18 DIAGNOSIS — E1140 Type 2 diabetes mellitus with diabetic neuropathy, unspecified: Secondary | ICD-10-CM | POA: Insufficient documentation

## 2023-08-18 DIAGNOSIS — K219 Gastro-esophageal reflux disease without esophagitis: Secondary | ICD-10-CM | POA: Diagnosis not present

## 2023-08-18 DIAGNOSIS — K2980 Duodenitis without bleeding: Secondary | ICD-10-CM | POA: Diagnosis not present

## 2023-08-18 DIAGNOSIS — Z794 Long term (current) use of insulin: Secondary | ICD-10-CM | POA: Insufficient documentation

## 2023-08-18 DIAGNOSIS — E1165 Type 2 diabetes mellitus with hyperglycemia: Secondary | ICD-10-CM | POA: Diagnosis not present

## 2023-08-18 DIAGNOSIS — Z7984 Long term (current) use of oral hypoglycemic drugs: Secondary | ICD-10-CM | POA: Insufficient documentation

## 2023-08-18 DIAGNOSIS — I10 Essential (primary) hypertension: Secondary | ICD-10-CM | POA: Diagnosis not present

## 2023-08-18 DIAGNOSIS — G4730 Sleep apnea, unspecified: Secondary | ICD-10-CM | POA: Diagnosis not present

## 2023-08-18 DIAGNOSIS — Z87891 Personal history of nicotine dependence: Secondary | ICD-10-CM | POA: Insufficient documentation

## 2023-08-18 DIAGNOSIS — R1011 Right upper quadrant pain: Secondary | ICD-10-CM | POA: Insufficient documentation

## 2023-08-18 DIAGNOSIS — G8929 Other chronic pain: Secondary | ICD-10-CM | POA: Diagnosis present

## 2023-08-18 DIAGNOSIS — E119 Type 2 diabetes mellitus without complications: Secondary | ICD-10-CM | POA: Diagnosis not present

## 2023-08-18 HISTORY — PX: ESOPHAGOGASTRODUODENOSCOPY (EGD) WITH PROPOFOL: SHX5813

## 2023-08-18 HISTORY — PX: BIOPSY: SHX5522

## 2023-08-18 LAB — GLUCOSE, CAPILLARY: Glucose-Capillary: 161 mg/dL — ABNORMAL HIGH (ref 70–99)

## 2023-08-18 SURGERY — ESOPHAGOGASTRODUODENOSCOPY (EGD) WITH PROPOFOL
Anesthesia: General

## 2023-08-18 MED ORDER — MIDAZOLAM HCL 2 MG/2ML IJ SOLN
INTRAMUSCULAR | Status: AC
Start: 2023-08-18 — End: ?
  Filled 2023-08-18: qty 2

## 2023-08-18 MED ORDER — LIDOCAINE HCL (CARDIAC) PF 100 MG/5ML IV SOSY
PREFILLED_SYRINGE | INTRAVENOUS | Status: DC | PRN
  Administered 2023-08-18: 100 mg via INTRATRACHEAL

## 2023-08-18 MED ORDER — PROPOFOL 10 MG/ML IV BOLUS
INTRAVENOUS | Status: DC | PRN
  Administered 2023-08-18 (×3): 25 mg via INTRAVENOUS
  Administered 2023-08-18: 100 mg via INTRAVENOUS
  Administered 2023-08-18 (×3): 25 mg via INTRAVENOUS

## 2023-08-18 MED ORDER — LIDOCAINE HCL (PF) 2 % IJ SOLN
INTRAMUSCULAR | Status: AC
  Filled 2023-08-18: qty 5

## 2023-08-18 MED ORDER — LACTATED RINGERS IV SOLN: INTRAVENOUS | Status: DC | PRN

## 2023-08-18 MED ORDER — MIDAZOLAM HCL 2 MG/2ML IJ SOLN
INTRAMUSCULAR | Status: DC | PRN
  Administered 2023-08-18: 2 mg via INTRAVENOUS

## 2023-08-18 NOTE — H&P (Signed)
Kyle Collins is an 38 y.o. male.   Chief Complaint:  abdominal pain.  HPI: Kyle Collins is a 38 y.o. male with past medical history of ADHD, anxiety, bipolar disorder, depression, diabetes c/b neuropathy, GERD, hypertension, sleep apnea, hyperlipidemia, who presents for evaluation of abdominal pain.   States having intermittent RUQ and R flank pain. The patient denies having any nausea, vomiting, fever, chills, hematochezia, melena, hematemesis, abdominal distention, diarrhea, jaundice, pruritus or weight loss. Also has R back pain.   Past Medical History:  Diagnosis Date   ADHD (attention deficit hyperactivity disorder)    Anxiety    Bipolar disorder (HCC)    Depression    Diabetes mellitus    GERD (gastroesophageal reflux disease)    Headache    Hemochromatosis 2019   Hypertension    Polycythemia    Sleep apnea    Thyroid disease     Past Surgical History:  Procedure Laterality Date   BIOPSY  12/14/2020   Procedure: BIOPSY;  Surgeon: Dolores Frame, MD;  [facility];  Service: Gastroenterology;;   CHOLECYSTECTOMY N/A 11/24/2018   Procedure: LAPAROSCOPIC CHOLECYSTECTOMY;  Surgeon: Franky Macho, MD;  Location: [facility];  Service: General;  Laterality: N/A;   CIRCUMCISION N/A 09/14/2017   Procedure: CIRCUMCISION;  Surgeon: Malen Gauze, MD;  Location: [facility];  Service: Urology;  Laterality: N/A;  1 HR 336 [phone] - He has a Child psychotherapist that needs to be called if changes made Vikki Ports @ [phone] MEDICARE-[id number] MEDICAID-[id number] N   ESOPHAGOGASTRODUODENOSCOPY (EGD) WITH PROPOFOL N/A 12/14/2020   castaneda:Normal esophagus. z line irregular, normal stomach, duodenal erosions without bleeding, neg h pylori, biopsies unremarkable.   HERNIA REPAIR     scrotum   MAXILLARY ANTROSTOMY Right 08/18/2018   Procedure: ENDOSCOPIC MAXILLARY RIGHT ANTROSTOMY WITH TISSUE REMOVAL;  Surgeon: Newman Pies, MD;  [facility]: [facility];  Service: ENT;  Laterality: Right;    NASAL SEPTOPLASTY W/ TURBINOPLASTY Bilateral 08/18/2018   Procedure: NASAL SEPTOPLASTY WITH TURBINATE REDUCTION;  Surgeon: Newman Pies, MD;  [facility];  Service: ENT;  Laterality: Bilateral;   NASAL SEPTUM SURGERY  08/18/2018   ORIF SHOULDER FRACTURE Right 02/28/2016   Procedure: OPEN REDUCTION INTERNAL FIXATION (ORIF) SHOULDER FRACTURE;  Surgeon: Vickki Hearing, MD;  Location: [facility];  Service: Orthopedics;  Laterality: Right;   SHOULDER CLOSED REDUCTION Right 12/04/2015   Procedure: CLOSED REDUCTION SHOULDER;  Surgeon: Vickki Hearing, MD;  Location: [facility];  Service: Orthopedics;  Laterality: Right;   TOOTH EXTRACTION  spring 2016   top left     Family History  Problem Relation Age of Onset   Diabetes Mother    Diabetes Father    Social History:  reports that he quit smoking about 14 years ago. His smoking use included e-cigarettes and cigarettes. He started smoking about 19 years ago. He has a 2.5 pack-year smoking history. He has never used smokeless tobacco. He reports that he does not currently use alcohol. He reports that he does not use drugs.  Allergies:  Allergies  Allergen Reactions   Methylphenidate Derivatives Other (See Comments)    Hallucinations     Medications Prior to Admission  Medication Sig Dispense Refill   amLODipine (NORVASC) 10 MG tablet Take 1 tablet (10 mg total) by mouth daily. 30 tablet 1   chlorthalidone (HYGROTON) 50 MG tablet Take 50 mg by mouth daily.     dicyclomine (BENTYL) 10 MG capsule Take 1 capsule (10 mg total) by mouth every  12 (twelve) hours as needed. 90 capsule 1   famotidine (PEPCID) 20 MG tablet Take 20 mg by mouth 2 (two) times daily.     Insulin Glargine (BASAGLAR KWIKPEN) 100 UNIT/ML Inject 15 Units into the skin daily.     JARDIANCE 10 MG TABS tablet Take 10 mg by mouth daily.     metFORMIN (GLUCOPHAGE) 500 MG tablet Take 1,000 mg by mouth 2 (two) times daily.     metoprolol succinate (TOPROL-XL) 100 MG 24 hr tablet  Take 100 mg by mouth daily.     ondansetron (ZOFRAN-ODT) 4 MG disintegrating tablet Take 1 tablet (4 mg total) by mouth every 8 (eight) hours as needed for nausea or vomiting. 90 tablet 3   oxyCODONE (OXY IR/ROXICODONE) 5 MG immediate release tablet Take 5 mg by mouth every 4 (four) hours as needed for severe pain (pain score 7-10).     atorvastatin (LIPITOR) 10 MG tablet Take by mouth.     gemfibrozil (LOPID) 600 MG tablet Take 600 mg by mouth 2 (two) times daily.     Insulin Pen Needle (ABOUTTIME PEN NEEDLE) 31G X 8 MM MISC Use as needed to inject insulin daily as instructed 150 each 1   lisinopril (PRINIVIL,ZESTRIL) 40 MG tablet Take 1 tablet (40 mg total) by mouth daily. 30 tablet 1   omeprazole (PRILOSEC) 40 MG capsule Take 1 capsule (40 mg total) by mouth daily. (Patient not taking: Reported on 07/02/2023) 90 capsule 3   potassium chloride SA (KLOR-CON) 20 MEQ tablet Take 20 mEq by mouth daily.      Results for orders placed or performed during the hospital encounter of 08/18/23 (from the past 48 hours)  Glucose, capillary     Status: Abnormal   Collection Time: 08/18/23  6:21 AM  Result Value Ref Range   Glucose-Capillary 161 (H) 70 - 99 mg/dL    Comment: Glucose reference range applies only to samples taken after fasting for at least 8 hours.   No results found.  Review of Systems  Gastrointestinal:  Positive for abdominal pain.  All other systems reviewed and are negative.   Blood pressure (!) 141/98, pulse 75, temperature 98.4 F (36.9 C), temperature source Oral, resp. rate 12, height 5\' 11"  (1.803 m), weight (!) 141 kg, SpO2 98%. Physical Exam  GENERAL: The patient is AO x3, in no acute distress. HEENT: Head is normocephalic and atraumatic. EOMI are intact. Mouth is well hydrated and without lesions. NECK: Supple. No masses LUNGS: Clear to auscultation. No presence of rhonchi/wheezing/rales. Adequate chest expansion HEART: RRR, normal s1 and s2. ABDOMEN: tender to  palpation in RUQ and flank, no guarding, no peritoneal signs, and nondistended. BS +. No masses. EXTREMITIES: Without any cyanosis, clubbing, rash, lesions or edema. NEUROLOGIC: AOx3, no focal motor deficit. SKIN: no jaundice, no rashes   Assessment/Plan Kyle Collins is a 38 y.o. male with past medical history of ADHD, anxiety, bipolar disorder, depression, diabetes c/b neuropathy, GERD, hypertension, sleep apnea, hyperlipidemia, who presents for evaluation of abdominal pain. Will proceed with colonoscopy.  Dolores Frame, MD 08/18/2023, 7:30 AM

## 2023-08-18 NOTE — Anesthesia Preprocedure Evaluation (Signed)
Anesthesia Evaluation  Patient identified by MRN, date of birth, ID band Patient awake    Reviewed: Allergy & Precautions, H&P , NPO status , Patient's Chart, lab work & pertinent test results, reviewed documented beta blocker date and time   Airway Mallampati: II  TM Distance: >3 FB Neck ROM: full    Dental no notable dental hx.    Pulmonary sleep apnea , former smoker   Pulmonary exam normal breath sounds clear to auscultation       Cardiovascular Exercise Tolerance: Good hypertension, negative cardio ROS  Rhythm:regular Rate:Normal     Neuro/Psych  Headaches PSYCHIATRIC DISORDERS Anxiety Depression Bipolar Disorder      GI/Hepatic Neg liver ROS,GERD  Medicated,,  Endo/Other  negative endocrine ROSdiabetes, Poorly Controlled, Type 2    Renal/GU negative Renal ROS  negative genitourinary   Musculoskeletal   Abdominal   Peds  Hematology negative hematology ROS (+)   Anesthesia Other Findings   Reproductive/Obstetrics negative OB ROS                              Anesthesia Physical Anesthesia Plan  ASA: III  Anesthesia Plan: General   Post-op Pain Management:    Induction:   PONV Risk Score and Plan: Propofol infusion  Airway Management Planned:   Additional Equipment:   Intra-op Plan:   Post-operative Plan:   Informed Consent: I have reviewed the patients History and Physical, chart, labs and discussed the procedure including the risks, benefits and alternatives for the proposed anesthesia with the patient or authorized representative who has indicated his/her understanding and acceptance.     Dental Advisory Given  Plan Discussed with: CRNA  Anesthesia Plan Comments:          Anesthesia Quick Evaluation

## 2023-08-18 NOTE — Transfer of Care (Signed)
Immediate Anesthesia Transfer of Care Note  Patient: Kyle Collins  Procedure(s) Performed: ESOPHAGOGASTRODUODENOSCOPY (EGD) WITH PROPOFOL BIOPSY  Patient Location: PACU  Anesthesia Type:General  Level of Consciousness: awake, alert , and oriented  Airway & Oxygen Therapy: Patient Spontanous Breathing  Post-op Assessment: Report given to RN and Post -op Vital signs reviewed and stable  Post vital signs: Reviewed and stable  Last Vitals:  Vitals Value Taken Time  BP 140/98 08/18/23 0756  Temp 36.4 C 08/18/23 0756  Pulse 63 08/18/23 0756  Resp 15 08/18/23 0756  SpO2 97 % 08/18/23 0756    Last Pain:  Vitals:   08/18/23 0736  TempSrc:   PainSc: 4       Patients Stated Pain Goal: 3 (08/18/23 0644)  Complications: No notable events documented.

## 2023-08-18 NOTE — Op Note (Signed)
[facility] Patient Name: Kyle Collins Procedure Date: 08/18/2023 7:07 AM MRN: [id number] Date of Birth: 09/20/1985 Attending MD: Katrinka Blazing , , [id number] CSN: [id number] Age: 38 Admit Type: Outpatient Procedure:                Upper GI endoscopy Indications:              Abdominal pain in the right upper quadrant Providers:                Katrinka Blazing, Crystal Page, Lennice Sites                            Technician, Technician Referring MD:              Medicines:                Monitored Anesthesia Care Complications:            No immediate complications. Estimated Blood Loss:     Estimated blood loss: none. Procedure:                Pre-Anesthesia Assessment:                           - Prior to the procedure, a History and Physical                            was performed, and patient medications, allergies                            and sensitivities were reviewed. The patient's                            tolerance of previous anesthesia was reviewed.                           - The risks and benefits of the procedure and the                            sedation options and risks were discussed with the                            patient. All questions were answered and informed                            consent was obtained.                           - ASA Grade Assessment: II - A patient with mild                            systemic disease.                           After obtaining informed consent, the endoscope was                            passed under direct vision. Throughout  the                            procedure, the patient's blood pressure, pulse, and                            oxygen saturations were monitored continuously. The                            [id number]-[id number] ([id number]) scope was introduced through the                            mouth, and advanced to the second part of duodenum.                            The upper GI endoscopy was  accomplished without                            difficulty. The patient tolerated the procedure                            well. Scope In: 7:40:57 AM Scope Out: 7:49:48 AM Total Procedure Duration: 0 hours 8 minutes 51 seconds  Findings:      The esophagus was normal.      The stomach was normal. Biopsies were taken with a cold forceps for       Helicobacter pylori testing.      The examined duodenum was normal. Biopsies were taken with a cold       forceps for histology. Impression:               - Normal esophagus.                           - Normal stomach. Biopsied.                           - Normal examined duodenum. Biopsied. Moderate Sedation:      Per Anesthesia Care Recommendation:           - Discharge patient to home (ambulatory).                           - Resume previous diet.                           - Await pathology results.                           - Consider evaluation by urology for                            nephrolithiasis and discussion for spine pathology                            with PCP. Procedure Code(s):        --- Professional ---  96045, Esophagogastroduodenoscopy, flexible,                            transoral; with biopsy, single or multiple Diagnosis Code(s):        --- Professional ---                           R10.11, Right upper quadrant pain CPT copyright 2022 [employer]. All rights reserved. The codes documented in this report are preliminary and upon coder review may  be revised to meet current compliance requirements. Katrinka Blazing, MD Katrinka Blazing,  08/18/2023 7:55:53 AM This report has been signed electronically. Number of Addenda: 0

## 2023-08-18 NOTE — Discharge Instructions (Signed)
You are being discharged to home.  Resume your previous diet.  We are waiting for your pathology results.  Consider evaluation by urology for nephrolithiasis and discussion for spine pathology with PCP.

## 2023-08-19 LAB — SURGICAL PATHOLOGY

## 2023-08-20 ENCOUNTER — Encounter (INDEPENDENT_AMBULATORY_CARE_PROVIDER_SITE_OTHER): Payer: Self-pay | Admitting: *Deleted

## 2023-08-20 ENCOUNTER — Encounter (HOSPITAL_COMMUNITY): Payer: Self-pay | Admitting: Gastroenterology

## 2023-08-21 NOTE — Anesthesia Postprocedure Evaluation (Signed)
Anesthesia Post Note  Patient: Kyle Collins  Procedure(s) Performed: ESOPHAGOGASTRODUODENOSCOPY (EGD) WITH PROPOFOL BIOPSY  Patient location during evaluation: Phase II Anesthesia Type: General Level of consciousness: awake Pain management: pain level controlled Vital Signs Assessment: post-procedure vital signs reviewed and stable Respiratory status: spontaneous breathing and respiratory function stable Cardiovascular status: blood pressure returned to baseline and stable Postop Assessment: no headache and no apparent nausea or vomiting Anesthetic complications: no Comments: Late entry   No notable events documented.   Last Vitals:  Vitals:   08/18/23 0644 08/18/23 0756  BP: (!) 141/98 (!) 140/98  Pulse: 75 63  Resp: 12 15  Temp: 36.9 C (!) 36.4 C  SpO2: 98% 97%    Last Pain:  Vitals:   08/18/23 0736  TempSrc:   PainSc: 4                  Windell Norfolk

## 2023-08-24 NOTE — Progress Notes (Signed)
Pathology shows evidence of chronic duodenitis

## 2023-09-09 ENCOUNTER — Emergency Department (HOSPITAL_COMMUNITY)
Admission: EM | Admit: 2023-09-09 | Discharge: 2023-09-09 | Disposition: A | Discharge status: Home / Self Care | Payer: Medicare Other

## 2023-09-09 ENCOUNTER — Emergency Department (HOSPITAL_COMMUNITY): Payer: Medicare Other

## 2023-09-09 ENCOUNTER — Other Ambulatory Visit: Payer: Self-pay

## 2023-09-09 ENCOUNTER — Encounter (HOSPITAL_COMMUNITY): Payer: Self-pay

## 2023-09-09 DIAGNOSIS — Z7984 Long term (current) use of oral hypoglycemic drugs: Secondary | ICD-10-CM | POA: Diagnosis not present

## 2023-09-09 DIAGNOSIS — Z794 Long term (current) use of insulin: Secondary | ICD-10-CM | POA: Insufficient documentation

## 2023-09-09 DIAGNOSIS — N202 Calculus of kidney with calculus of ureter: Secondary | ICD-10-CM | POA: Insufficient documentation

## 2023-09-09 DIAGNOSIS — E119 Type 2 diabetes mellitus without complications: Secondary | ICD-10-CM | POA: Insufficient documentation

## 2023-09-09 DIAGNOSIS — N200 Calculus of kidney: Secondary | ICD-10-CM

## 2023-09-09 DIAGNOSIS — R109 Unspecified abdominal pain: Secondary | ICD-10-CM | POA: Diagnosis present

## 2023-09-09 LAB — CBC WITH DIFFERENTIAL/PLATELET
Abs Immature Granulocytes: 0.04 10*3/uL (ref 0.00–0.07)
Basophils Absolute: 0 10*3/uL (ref 0.0–0.1)
Basophils Relative: 0 %
Eosinophils Absolute: 0 10*3/uL (ref 0.0–0.5)
Eosinophils Relative: 0 %
HCT: 49.2 % (ref 39.0–52.0)
Hemoglobin: 17.2 g/dL — ABNORMAL HIGH (ref 13.0–17.0)
Immature Granulocytes: 0 %
Lymphocytes Relative: 11 %
Lymphs Abs: 1.3 10*3/uL (ref 0.7–4.0)
MCH: 30.8 pg (ref 26.0–34.0)
MCHC: 35 g/dL (ref 30.0–36.0)
MCV: 88 fL (ref 80.0–100.0)
Monocytes Absolute: 0.5 10*3/uL (ref 0.1–1.0)
Monocytes Relative: 5 %
Neutro Abs: 9.8 10*3/uL — ABNORMAL HIGH (ref 1.7–7.7)
Neutrophils Relative %: 84 %
Platelets: 257 10*3/uL (ref 150–400)
RBC: 5.59 MIL/uL (ref 4.22–5.81)
RDW: 12.6 % (ref 11.5–15.5)
WBC: 11.7 10*3/uL — ABNORMAL HIGH (ref 4.0–10.5)
nRBC: 0 % (ref 0.0–0.2)

## 2023-09-09 LAB — BASIC METABOLIC PANEL
Anion gap: 15 (ref 5–15)
BUN: 15 mg/dL (ref 6–20)
CO2: 21 mmol/L — ABNORMAL LOW (ref 22–32)
Calcium: 9.6 mg/dL (ref 8.9–10.3)
Chloride: 103 mmol/L (ref 98–111)
Creatinine, Ser: 1.16 mg/dL (ref 0.61–1.24)
GFR, Estimated: >60 mL/min (ref >60)
Glucose, Bld: 262 mg/dL — ABNORMAL HIGH (ref 70–99)
Potassium: 3.9 mmol/L (ref 3.5–5.1)
Sodium: 139 mmol/L (ref 135–145)

## 2023-09-09 LAB — URINALYSIS, ROUTINE W REFLEX MICROSCOPIC
Bacteria, UA: NONE SEEN
Bilirubin Urine: NEGATIVE
Glucose, UA: >=500 mg/dL — AB
Ketones, ur: 80 mg/dL — AB
Leukocytes,Ua: NEGATIVE
Nitrite: NEGATIVE
Protein, ur: 30 mg/dL — AB
RBC / HPF: >50 RBC/hpf (ref 0–5)
Specific Gravity, Urine: 1.018 (ref 1.005–1.030)
pH: 7 (ref 5.0–8.0)

## 2023-09-09 MED ORDER — TAMSULOSIN HCL 0.4 MG PO CAPS: 0.4000 mg | ORAL_CAPSULE | Freq: Every day | ORAL | 0 refills | Status: AC

## 2023-09-09 MED ORDER — MORPHINE SULFATE (PF) 4 MG/ML IV SOLN
4.0000 mg | Freq: Once | INTRAVENOUS | Status: AC
  Administered 2023-09-09: 4 mg via INTRAVENOUS
  Filled 2023-09-09: qty 1

## 2023-09-09 MED ORDER — SODIUM CHLORIDE 0.9 % IV BOLUS
1000.0000 mL | Freq: Once | INTRAVENOUS | Status: AC
  Administered 2023-09-09: 1000 mL via INTRAVENOUS

## 2023-09-09 MED ORDER — ONDANSETRON HCL 4 MG/2ML IJ SOLN
4.0000 mg | Freq: Once | INTRAMUSCULAR | Status: AC
  Administered 2023-09-09: 4 mg via INTRAVENOUS
  Filled 2023-09-09: qty 2

## 2023-09-09 MED ORDER — ONDANSETRON HCL 4 MG PO TABS: 4.0000 mg | ORAL_TABLET | Freq: Four times a day (QID) | ORAL | 0 refills | Status: DC

## 2023-09-09 MED ORDER — OXYCODONE-ACETAMINOPHEN 5-325 MG PO TABS
1.0000 | ORAL_TABLET | Freq: Once | ORAL | Status: AC
  Administered 2023-09-09: 1 via ORAL
  Filled 2023-09-09: qty 1

## 2023-09-09 MED ORDER — OXYCODONE HCL 5 MG PO TABS: 5.0000 mg | ORAL_TABLET | Freq: Four times a day (QID) | ORAL | 0 refills | Status: AC | PRN

## 2023-09-09 NOTE — ED Notes (Signed)
Pt presents very anxious in Triage. Pt unable to sit in one place. Pt reports he is unable to walk, yet is observed pacing back and forth in Triage room.

## 2023-09-09 NOTE — Discharge Instructions (Addendum)
Please follow-up with your urologist with 1 have attached your for you today in regards to be symptoms and ER visit.  Today your labs and imaging show that you have a 2 x 3 stone in the right ureter causing your pain.  You may use the Zofran I have prescribed for you for any nausea vomiting might have.  You may take Tylenol every 6 hours needed for pain however prescribe your oxycodone in case pain is not controlled.  I have also prescribed you tamsulosin to help you pass the stone.  This medication may make you slightly dizzy as it may drop her blood pressure so please be aware of this.  Please drink plenty of fluids and some change or worsen please return to the ER.

## 2023-09-09 NOTE — ED Triage Notes (Signed)
Pt arrived arrived via POV c/o right flank pain. Pt endorses nausea, being dehydrated and reports Hx of known kidney stone that he reports he cannot pass.

## 2023-09-09 NOTE — ED Provider Notes (Signed)
[place] EMERGENCY DEPARTMENT AT [facility] Provider Note   CSN: [id number] Arrival date & time: 09/09/23  0844     History  Chief Complaint  Patient presents with   Flank Pain    Kyle Collins is a 38 y.o. male history of diabetes, kidney stones, bipolar, anxiety, thyroid disease presented for right flank pain for the past 2 days.  Patient states feels very similar to previous kidney stones.  Patient denies any dysuria but does state he has been vomiting.  Patient denies any chest pain shortness of breath or abdominal pain.  Patient denies fevers.  Patient states he still able to urinate.  Home Medications Prior to Admission medications   Medication Sig Start Date End Date Taking? Authorizing Provider  ondansetron (ZOFRAN) 4 MG tablet Take 1 tablet (4 mg total) by mouth every 6 (six) hours. 09/09/23  Yes Doyle Tegethoff, Beverly Gust, PA-C  oxyCODONE (ROXICODONE) 5 MG immediate release tablet Take 1 tablet (5 mg total) by mouth every 6 (six) hours as needed for up to 3 days for severe pain (pain score 7-10). 09/09/23 09/12/23 Yes Shirla Hodgkiss, Beverly Gust, PA-C  tamsulosin (FLOMAX) 0.4 MG CAPS capsule Take 1 capsule (0.4 mg total) by mouth daily after supper for 10 days. 09/09/23 09/19/23 Yes Jamillia Closson, Beverly Gust, PA-C  amLODipine (NORVASC) 10 MG tablet Take 1 tablet (10 mg total) by mouth daily. 11/25/18   Vassie Loll, MD  atorvastatin (LIPITOR) 10 MG tablet Take by mouth. 05/08/20   [provider]  chlorthalidone (HYGROTON) 50 MG tablet Take 50 mg by mouth daily. 05/18/19   [provider]  dicyclomine (BENTYL) 10 MG capsule Take 1 capsule (10 mg total) by mouth every 12 (twelve) hours as needed. 07/02/23   Dolores Frame, MD  famotidine (PEPCID) 20 MG tablet Take 20 mg by mouth 2 (two) times daily.    [provider]  gemfibrozil (LOPID) 600 MG tablet Take 600 mg by mouth 2 (two) times daily. 05/25/19   [provider]  Insulin Glargine (BASAGLAR  KWIKPEN) 100 UNIT/ML Inject 15 Units into the skin daily.    [provider]  Insulin Pen Needle (ABOUTTIME PEN NEEDLE) 31G X 8 MM MISC Use as needed to inject insulin daily as instructed 11/24/18   Vassie Loll, MD  JARDIANCE 10 MG TABS tablet Take 10 mg by mouth daily. 03/08/20   [provider]  lisinopril (PRINIVIL,ZESTRIL) 40 MG tablet Take 1 tablet (40 mg total) by mouth daily. 06/26/14   Bethann Berkshire, MD  metFORMIN (GLUCOPHAGE) 500 MG tablet Take 1,000 mg by mouth 2 (two) times daily. 05/18/19   [provider]  metoprolol succinate (TOPROL-XL) 100 MG 24 hr tablet Take 100 mg by mouth daily. 05/18/20   [provider]  omeprazole (PRILOSEC) 40 MG capsule Take 1 capsule (40 mg total) by mouth daily. Patient not taking: Reported on 07/02/2023 02/28/21   Carlan, Leeroy Bock L, NP  ondansetron (ZOFRAN-ODT) 4 MG disintegrating tablet Take 1 tablet (4 mg total) by mouth every 8 (eight) hours as needed for nausea or vomiting. 07/02/23   Dolores Frame, MD  potassium chloride SA (KLOR-CON) 20 MEQ tablet Take 20 mEq by mouth daily.    [provider]      Allergies    Methylphenidate derivatives    Review of Systems   Review of Systems  Genitourinary:  Positive for flank pain.    Physical Exam Updated Vital Signs BP (!) 164/92 (BP Location: Left Arm)  Pulse 94   Temp 98.4 F (36.9 C) (Oral)   Resp 18   Ht 5\' 11"  (1.803 m)   Wt (!) 142 kg   SpO2 100%   BMI 43.66 kg/m  Physical Exam Vitals reviewed.  Constitutional:      General: He is in acute distress.  HENT:     Head: Normocephalic and atraumatic.  Eyes:     Extraocular Movements: Extraocular movements intact.     Conjunctiva/sclera: Conjunctivae normal.     Pupils: Pupils are equal, round, and reactive to light.  Cardiovascular:     Rate and Rhythm: Normal rate and regular rhythm.     Pulses: Normal pulses.     Heart sounds: Normal heart sounds.     Comments: 2+  bilateral radial/dorsalis pedis pulses with regular rate Pulmonary:     Effort: Pulmonary effort is normal. No respiratory distress.     Breath sounds: Normal breath sounds.  Abdominal:     Palpations: Abdomen is soft.     Tenderness: There is no abdominal tenderness. There is right CVA tenderness. There is no left CVA tenderness, guarding or rebound.  Musculoskeletal:        General: Normal range of motion.     Cervical back: Normal range of motion and neck supple.     Comments: 5 out of 5 bilateral grip/leg extension strength  Skin:    General: Skin is warm and dry.     Capillary Refill: Capillary refill takes less than 2 seconds.  Neurological:     General: No focal deficit present.     Mental Status: He is alert and oriented to person, place, and time.     Comments: Sensation intact in all 4 limbs  Psychiatric:        Mood and Affect: Mood normal.     ED Results / Procedures / Treatments   Labs (all labs ordered are listed, but only abnormal results are displayed) Labs Reviewed  CBC WITH DIFFERENTIAL/PLATELET - Abnormal; Notable for the following components:      Result Value   WBC 11.7 (*)    Hemoglobin 17.2 (*)    Neutro Abs 9.8 (*)    All other components within normal limits  URINALYSIS, ROUTINE W REFLEX MICROSCOPIC - Abnormal; Notable for the following components:   Glucose, UA >=500 (*)    Hgb urine dipstick MODERATE (*)    Ketones, ur 80 (*)    Protein, ur 30 (*)    All other components within normal limits  BASIC METABOLIC PANEL - Abnormal; Notable for the following components:   CO2 21 (*)    Glucose, Bld 262 (*)    All other components within normal limits    EKG None  Radiology CT Renal Stone Study Result Date: 09/09/2023 CLINICAL DATA:  Abdominal/flank pain, stone suspected. Right flank pain. EXAM: CT ABDOMEN AND PELVIS WITHOUT CONTRAST TECHNIQUE: Multidetector CT imaging of the abdomen and pelvis was performed following the standard protocol without  IV contrast. RADIATION DOSE REDUCTION: This exam was performed according to the departmental dose-optimization program which includes automated exposure control, adjustment of the mA and/or kV according to patient size and/or use of iterative reconstruction technique. COMPARISON:  CT scan renal stone protocol from 05/31/2023. FINDINGS: Lower chest: The lung bases are clear. No pleural effusion. The heart is normal in size. No pericardial effusion. Hepatobiliary: The liver is normal in size. Non-cirrhotic configuration. No suspicious mass. No intrahepatic or extrahepatic bile duct dilation. Gallbladder is surgically absent.  Pancreas: Unremarkable. No pancreatic ductal dilatation or surrounding inflammatory changes. Spleen: Within normal limits. No focal lesion. Adrenals/Urinary Tract: Adrenal glands are unremarkable. No suspicious renal mass within the limitations of this unenhanced exam. There is a 2 x 3 mm right ureteric calculus at the level of junction of upper/mid ureter causing mild proximal obstructive uropathy. There is an additional 1, 3 mm calculus in the right kidney upper pole calyx. Right ureter distal to the calculus is unremarkable. There are at least 2, nonobstructing calculi in the left kidney with largest in the interpolar region measuring up to 3-4 mm. No left ureterolithiasis or obstructive uropathy. Urinary bladder is under distended, precluding optimal assessment. However, no large mass or stones identified. No perivesical fat stranding. Stomach/Bowel: There is a small sliding hiatal hernia. No disproportionate dilation of the small or large bowel loops. No evidence of abnormal bowel wall thickening or inflammatory changes. The appendix is unremarkable. There are scattered diverticula mainly in the left hemi colon, without imaging signs of diverticulitis. Vascular/Lymphatic: No ascites or pneumoperitoneum. There is an oval 1.4 x 3.0 cm fat attenuation structure in the posterior pelvis with thin  hyperattenuating rim, likely sequela of epiploic appendagitis. No abdominal or pelvic lymphadenopathy, by size criteria. No aneurysmal dilation of the major abdominal arteries. Reproductive: Normal size prostate. Symmetric seminal vesicles. Other: There is a tiny fat containing umbilical hernia. The soft tissues and abdominal wall are otherwise unremarkable. Musculoskeletal: No suspicious osseous lesions. IMPRESSION: *There is a 2 x 3 mm right ureteric calculus causing mild proximal obstructive uropathy. There are additional sub 4 mm calculi in bilateral kidneys, as described above. No left ureterolithiasis or obstructive uropathy. *Multiple other nonacute observations, as described above. Electronically Signed   By: Jules Schick M.D.   On: 09/09/2023 11:27    Procedures Procedures    Medications Ordered in ED Medications  sodium chloride 0.9 % bolus 1,000 mL (has no administration in time range)  sodium chloride 0.9 % bolus 1,000 mL (1,000 mLs Intravenous New Bag/Given 09/09/23 1033)  ondansetron (ZOFRAN) injection 4 mg (4 mg Intravenous Given 09/09/23 1034)  morphine (PF) 4 MG/ML injection 4 mg (4 mg Intravenous Given 09/09/23 1034)    ED Course/ Medical Decision Making/ A&P                                 Medical Decision Making Amount and/or Complexity of Data Reviewed Labs: ordered. Radiology: ordered.  Risk Prescription drug management.   Diane Mochizuki Hiebert 38 y.o. presented today for flank pain. Working DDx that I considered at this time includes, but not limited to, MSK, nephrolithiasis, pyelonephritis, AAA, aortic dissection, mesenteric ischemia, RCC, obstructive uropathy, renal infarct/hemorrhage, tumor, biliary colic, pancreatitis, appendicitis, SBO, diverticulitis, shingles, lower lobe pneumonia, testicular torsion, epididymitis.  R/o DDx: MSK, pyelonephritis, AAA, aortic dissection, mesenteric ischemia, RCC, obstructive uropathy, renal infarct/hemorrhage, tumor, biliary colic,  pancreatitis, appendicitis, SBO, diverticulitis, shingles, lower lobe pneumonia, testicular torsion, epididymitis: These are considered less likely due to history of present illness, physical exam, lab/imaging findings  Review of prior external notes: 08/18/2023 surgery  Unique Tests and My Independent Interpretation:  CBC: My leukocytosis 11.7 BMP: Hyperglycemia 262 UA: Ketones present CT Renal stone study: 2 or 3 stone in the right ureter  Social Determinants of Health: none  Discussion with Independent Historian: None  Discussion of Management of Tests: None  Risk: Medium: prescription drug management  Risk Stratification Score: None  Plan:  On exam patient was in acute distress with stable vitals. Physical exam showed right CVA tenderness.  The rest of patient's physical exam was unremarkable.  Patient states this feels very similar to previous kidney stones and upon my independent interpretation of the CT scan ordered from triage there does appear to be a stone obstructing causing hydronephrosis.  Will wait for the rest of labs and give pain meds and Zofran for patient's symptoms at the moment but do feel that patient can be p.o. challenge and pain controlled can discharge and follow-up with urology.  Imaging does show 2 x 3 stone in the right ureter.  Labs are ultimately reassuring but do show some ketones however patient does not have anion gap and so do not feel that with his hyperglycemia he is in DKA.  Patient feels improved with medications but does request 1 more bag of fluids which is reasonable.  Patient's been able to tolerate fluids and has thus passed a p.o. challenge.  Will discharge patient on oxycodone along with tamsulosin and Zofran and follow-up with urology.  Patient was given return precautions. Patient stable for discharge at this time.  Patient verbalized understanding of plan.  This chart was dictated using voice recognition software.  Despite best efforts to  proofread,  errors can occur which can change the documentation meaning.         Final Clinical Impression(s) / ED Diagnoses Final diagnoses:  Nephrolithiasis    Rx / DC Orders ED Discharge Orders          Ordered    oxyCODONE (ROXICODONE) 5 MG immediate release tablet  Every 6 hours PRN        09/09/23 1145    tamsulosin (FLOMAX) 0.4 MG CAPS capsule  Daily after supper        09/09/23 1145    ondansetron (ZOFRAN) 4 MG tablet  Every 6 hours        09/09/23 1145              Netta Corrigan, PA-C 09/09/23 1148    Estanislado Pandy J, DO 09/09/23 1455

## 2023-09-23 ENCOUNTER — Ambulatory Visit: Payer: Medicare Other | Admitting: Urology

## 2023-09-25 ENCOUNTER — Telehealth (INDEPENDENT_AMBULATORY_CARE_PROVIDER_SITE_OTHER): Payer: Self-pay | Admitting: *Deleted

## 2023-09-25 ENCOUNTER — Other Ambulatory Visit (INDEPENDENT_AMBULATORY_CARE_PROVIDER_SITE_OTHER): Payer: Self-pay | Admitting: Gastroenterology

## 2023-09-25 DIAGNOSIS — G8929 Other chronic pain: Secondary | ICD-10-CM

## 2023-09-25 MED ORDER — DICYCLOMINE HCL 10 MG PO CAPS: 10.0000 mg | ORAL_CAPSULE | Freq: Two times a day (BID) | ORAL | 3 refills | Status: AC | PRN

## 2023-09-25 NOTE — Telephone Encounter (Addendum)
 Fax from [facility] requesting refill of dicyclomine 10 mg one every 12 hours as needed. Rx last filled 08/21/23.  Patient last office visit 07/02/23

## 2023-09-25 NOTE — Telephone Encounter (Signed)
 Medication sent.

## 2023-09-28 DIAGNOSIS — N200 Calculus of kidney: Secondary | ICD-10-CM | POA: Insufficient documentation

## 2023-09-28 NOTE — Progress Notes (Unsigned)
 Name: Kyle Collins DOB: [birth year] MRN: [id number]  History of Present Illness: Kyle Collins is a 38 y.o. male who presents today as a new patient at [facility]. All available relevant medical records have been reviewed.  ***He is accompanied by ***. - GU History: 1. Kidney stone(s).  He reports concern of kidney stone(s).   Recent history: > 09/09/2023: Seen in ER for right flank pain. CMP with normal renal function (GFR >60; creatinine 1.16). CBC with mild leukocytosis (WBC 11.7). UA showed 0-5 WBC/hpf, >50 RBC/hpf, no bacteria. CT showed a 2 x 3 mm right ureter stone with mild right hydronephrosis. Additional small stones (<4 mm) in both kidneys.  He was discharged with prescriptions for Flomax, Zofran, and Oxycodone 5 mg. He receives Oxycodone #90/month routinely from [facility].  Today: He {Actions; denies-reports:120008} passing the stone. He {Actions; denies-reports:120008} flank pain or abdominal pain. He {Actions; denies-reports:120008} fevers, nausea, or vomiting.  He {Actions; denies-reports:120008} increased urinary urgency, frequency, nocturia, dysuria, gross hematuria, hesitancy, straining to void, or sensations of incomplete emptying.   Fall Screening: Do you usually have a device to assist in your mobility? {yes/no:20286} ***cane / ***walker / ***wheelchair  Medications: Current Outpatient Medications  Medication Sig Dispense Refill   amLODipine (NORVASC) 10 MG tablet Take 1 tablet (10 mg total) by mouth daily. 30 tablet 1   atorvastatin (LIPITOR) 10 MG tablet Take by mouth.     chlorthalidone (HYGROTON) 50 MG tablet Take 50 mg by mouth daily.     dicyclomine (BENTYL) 10 MG capsule Take 1 capsule (10 mg total) by mouth every 12 (twelve) hours as needed. 180 capsule 3   famotidine (PEPCID) 20 MG tablet Take 20 mg by mouth 2 (two) times daily.     gemfibrozil (LOPID) 600 MG tablet Take 600 mg by mouth 2 (two) times daily.     Insulin  Glargine (BASAGLAR KWIKPEN) 100 UNIT/ML Inject 15 Units into the skin daily.     Insulin Pen Needle (ABOUTTIME PEN NEEDLE) 31G X 8 MM MISC Use as needed to inject insulin daily as instructed 150 each 1   JARDIANCE 10 MG TABS tablet Take 10 mg by mouth daily.     lisinopril (PRINIVIL,ZESTRIL) 40 MG tablet Take 1 tablet (40 mg total) by mouth daily. 30 tablet 1   metFORMIN (GLUCOPHAGE) 500 MG tablet Take 1,000 mg by mouth 2 (two) times daily.     metoprolol succinate (TOPROL-XL) 100 MG 24 hr tablet Take 100 mg by mouth daily.     omeprazole (PRILOSEC) 40 MG capsule Take 1 capsule (40 mg total) by mouth daily. (Patient not taking: Reported on 07/02/2023) 90 capsule 3   ondansetron (ZOFRAN) 4 MG tablet Take 1 tablet (4 mg total) by mouth every 6 (six) hours. 12 tablet 0   ondansetron (ZOFRAN-ODT) 4 MG disintegrating tablet Take 1 tablet (4 mg total) by mouth every 8 (eight) hours as needed for nausea or vomiting. 90 tablet 3   potassium chloride SA (KLOR-CON) 20 MEQ tablet Take 20 mEq by mouth daily.     No current facility-administered medications for this visit.    Allergies: Allergies  Allergen Reactions   Methylphenidate Derivatives Other (See Comments)    Hallucinations     Past Medical History:  Diagnosis Date   ADHD (attention deficit hyperactivity disorder)    Anxiety    Bipolar disorder (HCC)    Depression    Diabetes mellitus    GERD (gastroesophageal reflux disease)  Headache    Hemochromatosis 2019   Hypertension    Polycythemia    Sleep apnea    Thyroid disease    Past Surgical History:  Procedure Laterality Date   BIOPSY  12/14/2020   Procedure: BIOPSY;  Surgeon: Dolores Frame, MD;  [facility]: [facility];  Service: Gastroenterology;;   BIOPSY  08/18/2023   Procedure: BIOPSY;  Surgeon: Dolores Frame, MD;  [facility]: [facility];  Service: Gastroenterology;;   CHOLECYSTECTOMY N/A 11/24/2018   Procedure: LAPAROSCOPIC CHOLECYSTECTOMY;   Surgeon: Franky Macho, MD;  Location: [facility];  Service: General;  Laterality: N/A;   CIRCUMCISION N/A 09/14/2017   Procedure: CIRCUMCISION;  Surgeon: Malen Gauze, MD;  Location: [facility];  Service: Urology;  Laterality: N/A;  1 HR 336 [phone] - He has a Child psychotherapist that needs to be called if changes made Vikki Ports @ [phone] MEDICARE-[id number] MEDICAID-[id number] N   ESOPHAGOGASTRODUODENOSCOPY (EGD) WITH PROPOFOL N/A 12/14/2020   castaneda:Normal esophagus. z line irregular, normal stomach, duodenal erosions without bleeding, neg h pylori, biopsies unremarkable.   ESOPHAGOGASTRODUODENOSCOPY (EGD) WITH PROPOFOL N/A 08/18/2023   Procedure: ESOPHAGOGASTRODUODENOSCOPY (EGD) WITH PROPOFOL;  Surgeon: Dolores Frame, MD;  [facility];  Service: Gastroenterology;  Laterality: N/A;  2:30pm;asa 3   HERNIA REPAIR     scrotum   MAXILLARY ANTROSTOMY Right 08/18/2018   Procedure: ENDOSCOPIC MAXILLARY RIGHT ANTROSTOMY WITH TISSUE REMOVAL;  Surgeon: Newman Pies, MD;  Location: [facility];  Service: ENT;  Laterality: Right;   NASAL SEPTOPLASTY W/ TURBINOPLASTY Bilateral 08/18/2018   Procedure: NASAL SEPTOPLASTY WITH TURBINATE REDUCTION;  Surgeon: Newman Pies, MD;  Location: [facility];  Service: ENT;  Laterality: Bilateral;   NASAL SEPTUM SURGERY  08/18/2018   ORIF SHOULDER FRACTURE Right 02/28/2016   Procedure: OPEN REDUCTION INTERNAL FIXATION (ORIF) SHOULDER FRACTURE;  Surgeon: Vickki Hearing, MD;  Location: [facility];  Service: Orthopedics;  Laterality: Right;   SHOULDER CLOSED REDUCTION Right 12/04/2015   Procedure: CLOSED REDUCTION SHOULDER;  Surgeon: Vickki Hearing, MD;  Location: [facility];  Service: Orthopedics;  Laterality: Right;   TOOTH EXTRACTION  spring 2016   top left    Family History  Problem Relation Age of Onset   Diabetes Mother    Diabetes Father    Social History   Socioeconomic History   Marital status: Single    Spouse name: Not on file   Number of children:  Not on file   Years of education: Not on file   Highest education level: Not on file  Occupational History   Not on file  Tobacco Use   Smoking status: Former    Current packs/day: 0.00    Average packs/day: 0.5 packs/day for 5.0 years (2.5 ttl pk-yrs)    Types: E-cigarettes, Cigarettes    Start date: 02/25/2004    Quit date: 02/24/2009    Years since quitting: 14.6   Smokeless tobacco: Never  Vaping Use   Vaping status: Former   Quit date: 12/13/2018  Substance and Sexual Activity   Alcohol use: Not Currently    Comment: once year   Drug use: No    Comment: Hemp and cbd   Sexual activity: Never  Other Topics Concern   Not on file  Social History Narrative   Not on file   Social Drivers of Health   Financial Resource Strain: Low Risk  (04/18/2022)   Received [facility], [facility]   Overall Financial Resource Strain (CARDIA)    Difficulty  of Paying Living Expenses: Not hard at all  Food Insecurity: No Food Insecurity (04/18/2022)   Received from [facility], [facility]   Hunger Vital Sign    Worried About Running Out of Food in the Last Year: Never true    Ran Out of Food in the Last Year: Never true  Transportation Needs: No Transportation Needs (04/18/2022)   Received from [facility], [facility]   [facility] - Transportation    Lack of Transportation (Medical): No    Lack of Transportation (Non-Medical): No  Physical Activity: Inactive (04/18/2022)   Received from [facility], [facility]   Exercise Vital Sign    Days of Exercise per Week: 0 days    Minutes of Exercise per Session: 0 min  Stress: No Stress Concern Present (04/18/2022)   Received from [facility], [facility] - Occupational Stress Questionnaire    Feeling of Stress : Not at all  Social Connections: Socially Isolated (04/18/2022)   Received from [facility], [facility]   Social Connection and Isolation  Panel [NHANES]    Frequency of Communication with Friends and Family: Three times a week    Frequency of Social Gatherings with Friends and Family: Once a week    Attends Religious Services: Never    Database administrator or Organizations: No    Attends Banker Meetings: Never    Marital Status: Never married  Intimate Partner Violence: Not At Risk (04/18/2022)   Received from [facility], [facility]   Humiliation, Afraid, Rape, and Kick questionnaire    Fear of Current or Ex-Partner: No    Emotionally Abused: No    Physically Abused: No    Sexually Abused: No    SUBJECTIVE  Review of Systems Constitutional: Patient denies any unintentional weight loss or change in strength lntegumentary: Patient denies any rashes or pruritus Cardiovascular: Patient denies chest pain or syncope Respiratory: Patient denies shortness of breath Gastrointestinal: Patient ***denies nausea, vomiting, constipation, or diarrhea Musculoskeletal: Patient denies muscle cramps or weakness Neurologic: Patient denies convulsions or seizures Allergic/Immunologic: Patient denies recent allergic reaction(s) Hematologic/Lymphatic: Patient denies bleeding tendencies Endocrine: Patient denies heat/cold intolerance  GU: As per HPI.  OBJECTIVE There were no vitals filed for this visit. There is no height or weight on file to calculate BMI.  Physical Examination Constitutional: No obvious distress; patient is non-toxic appearing  Cardiovascular: No visible lower extremity edema.  Respiratory: The patient does not have audible wheezing/stridor; respirations do not appear labored  Gastrointestinal: Abdomen non-distended Musculoskeletal: Normal ROM of UEs  Skin: No obvious rashes/open sores  Neurologic: CN 2-12 grossly intact Psychiatric: Answered questions appropriately with normal affect  Hematologic/Lymphatic/Immunologic: No obvious bruises or sites of spontaneous bleeding  UA:   ***positive for *** leukocytes, *** blood, ***nitrites ***Urine microscopy:  ***negative  *** WBC/hpf, *** RBC/hpf, *** bacteria ***with no evidence of UTI ***with no evidence of microscopic hematuria ***otherwise unremarkable ***glucosuria (secondary to ***Jardiance ***Farxiga use)  PVR: *** ml  ASSESSMENT No diagnosis found.  ***We reviewed recent imaging results; ***awaiting radiology results, appears to have ***no acute findings per provider interpretation.  ***Advised adequate hydration and we discussed option to consider low oxalate diet given that calcium oxalate is the most common type of stone. Handout provided about stone prevention diet.  For acute GU stone symptoms we agreed to proceed with: - ***RUS and KUB / ***CT  stone study to evaluate current stone burden.  - ***CMP to assess kidney function. - ***Flomax 0.4 mg daily for medical expulsive therapy (MET), which may improve passage of stone(s).  - For pain management, we discussed the use of opioids versus OTC analgesics. A 5 day prescription was sent for ***Percocet for PRN use for severe pain.  - For nausea / vomiting, a prescription was sent for ***Zofran / ***Phenergan for PRN use.  ***We discussed the various treatment options including ***medical expulsive therapy (MET), ***extracorporeal shock wave lithotripsy (ESWL), ureteroscopic stone manipulation (URS), or ***percutaneous nephrolithotomy (PCNL). We discussed possible risks and benefits of intervention including but not limited to: including pain, infection, sepsis, UTI, ureter perforation, need for stenting, post-op ureteral stricture, hematuria.  ***Will consult with ***Dr. Ronne Binning and notify patient of his recommendations ***for next steps / ***following review of imaging results.  Will plan to follow up in ***2 weeks ***6 months with ***KUB ***RUS for stone surveillance or sooner if needed.   He was advised to contact urology provider or go to the ER if He  develops fever >101F, uncontrollable pain, or other significantly concerning symptoms prior to next office visit.  He verbalized understanding and agreement. All questions were answered.   PLAN Advised the following: ***Flomax daily x2 weeks. ***Analgesics PRN for pain. ***Zofran PRN for nausea. ***No follow-ups on file.  No orders of the defined types were placed in this encounter.   It has been explained that the patient is to follow regularly with their PCP in addition to all other providers involved in their care and to follow instructions provided by these respective offices. Patient advised to contact urology clinic if any urologic-pertaining questions, concerns, new symptoms or problems arise in the interim period.  There are no Patient Instructions on file for this visit.  Electronically signed by: Donnita Falls, MSN, FNP-C, CUNP 09/28/2023 3:36 PM

## 2023-09-29 ENCOUNTER — Encounter: Payer: Self-pay | Admitting: Urology

## 2023-09-29 ENCOUNTER — Ambulatory Visit (HOSPITAL_COMMUNITY)
Admission: RE | Admit: 2023-09-29 | Discharge: 2023-09-29 | Disposition: A | Discharge status: Home / Self Care | Source: Ambulatory Visit | Attending: Urology | Admitting: Urology

## 2023-09-29 ENCOUNTER — Ambulatory Visit (INDEPENDENT_AMBULATORY_CARE_PROVIDER_SITE_OTHER): Payer: Medicare Other | Admitting: Urology

## 2023-09-29 VITALS — BP 156/100 | HR 94

## 2023-09-29 DIAGNOSIS — R1010 Upper abdominal pain, unspecified: Secondary | ICD-10-CM

## 2023-09-29 DIAGNOSIS — N201 Calculus of ureter: Secondary | ICD-10-CM

## 2023-09-29 DIAGNOSIS — K449 Diaphragmatic hernia without obstruction or gangrene: Secondary | ICD-10-CM

## 2023-09-29 DIAGNOSIS — N200 Calculus of kidney: Secondary | ICD-10-CM | POA: Diagnosis not present

## 2023-09-29 DIAGNOSIS — R1031 Right lower quadrant pain: Secondary | ICD-10-CM | POA: Diagnosis present

## 2023-09-29 DIAGNOSIS — R1011 Right upper quadrant pain: Secondary | ICD-10-CM

## 2023-09-29 DIAGNOSIS — R109 Unspecified abdominal pain: Secondary | ICD-10-CM

## 2023-09-29 DIAGNOSIS — T402X5A Adverse effect of other opioids, initial encounter: Secondary | ICD-10-CM

## 2023-09-29 DIAGNOSIS — K5903 Drug induced constipation: Secondary | ICD-10-CM | POA: Insufficient documentation

## 2023-09-29 DIAGNOSIS — R10A1 Flank pain, right side: Secondary | ICD-10-CM

## 2023-09-29 MED ORDER — TAMSULOSIN HCL 0.4 MG PO CAPS: 0.4000 mg | ORAL_CAPSULE | Freq: Every day | ORAL | 0 refills | Status: AC

## 2023-09-30 LAB — URINALYSIS, ROUTINE W REFLEX MICROSCOPIC
Bilirubin, UA: NEGATIVE
Glucose, UA: NEGATIVE
Nitrite, UA: NEGATIVE
RBC, UA: NEGATIVE
Specific Gravity, UA: 1.02 (ref 1.005–1.030)
Urobilinogen, Ur: 0.2 mg/dL (ref 0.2–1.0)
pH, UA: 7 (ref 5.0–7.5)

## 2023-09-30 LAB — CBC
Hematocrit: 50.7 % (ref 37.5–51.0)
Hemoglobin: 16.9 g/dL (ref 13.0–17.7)
MCH: 30.5 pg (ref 26.6–33.0)
MCHC: 33.3 g/dL (ref 31.5–35.7)
MCV: 92 fL (ref 79–97)
Platelets: 249 10*3/uL (ref 150–450)
RBC: 5.54 x10E6/uL (ref 4.14–5.80)
RDW: 12.6 % (ref 11.6–15.4)
WBC: 8.1 10*3/uL (ref 3.4–10.8)

## 2023-09-30 LAB — COMPREHENSIVE METABOLIC PANEL
ALT: 23 IU/L (ref 0–44)
AST: 19 IU/L (ref 0–40)
Albumin: 4.7 g/dL (ref 4.1–5.1)
Alkaline Phosphatase: 76 IU/L (ref 44–121)
BUN/Creatinine Ratio: 12 (ref 9–20)
BUN: 12 mg/dL (ref 6–20)
Bilirubin Total: 0.8 mg/dL (ref 0.0–1.2)
CO2: 22 mmol/L (ref 20–29)
Calcium: 10.2 mg/dL (ref 8.7–10.2)
Chloride: 101 mmol/L (ref 96–106)
Creatinine, Ser: 0.99 mg/dL (ref 0.76–1.27)
Globulin, Total: 2.8 g/dL (ref 1.5–4.5)
Glucose: 136 mg/dL — ABNORMAL HIGH (ref 70–99)
Potassium: 4.9 mmol/L (ref 3.5–5.2)
Sodium: 140 mmol/L (ref 134–144)
Total Protein: 7.5 g/dL (ref 6.0–8.5)
eGFR: 101 mL/min/{1.73_m2} (ref >59)

## 2023-09-30 LAB — MICROSCOPIC EXAMINATION

## 2023-09-30 NOTE — Telephone Encounter (Signed)
 FYI and advise

## 2023-10-01 ENCOUNTER — Ambulatory Visit (INDEPENDENT_AMBULATORY_CARE_PROVIDER_SITE_OTHER): Payer: Medicare Other | Admitting: Gastroenterology

## 2023-10-01 ENCOUNTER — Encounter (INDEPENDENT_AMBULATORY_CARE_PROVIDER_SITE_OTHER): Payer: Self-pay | Admitting: Gastroenterology

## 2023-10-01 VITALS — BP 159/110 | HR 90 | Temp 99.4°F | Ht 71.0 in | Wt 285.8 lb

## 2023-10-01 DIAGNOSIS — R1031 Right lower quadrant pain: Secondary | ICD-10-CM

## 2023-10-01 DIAGNOSIS — R1011 Right upper quadrant pain: Secondary | ICD-10-CM

## 2023-10-01 DIAGNOSIS — G8929 Other chronic pain: Secondary | ICD-10-CM

## 2023-10-01 MED ORDER — SUCRALFATE 1 G PO TABS
1.0000 g | ORAL_TABLET | Freq: Three times a day (TID) | ORAL | 1 refills | Status: AC
Start: 2023-10-01 — End: ?

## 2023-10-01 NOTE — Patient Instructions (Addendum)
 As we discussed today, I feel there may be multiple things contributing to your symptoms I would recommend you continue to follow with urology for your known kidney stones and try to have further evaluation by your PCP or a spine doctor to determine if referred pain from your back could be contributing to your abdominal pain I will send carafate to see if this improves your abdominal discomfort in the meantime given findings of some inflammation in the small bowel on EGD Please continue your omeprazole 40mg  daily  Follow up 4 months  It was a pleasure to see you today. I want to create trusting relationships with patients and provide genuine, compassionate, and quality care. I truly value your feedback! please be on the lookout for a survey regarding your visit with me today. I appreciate your input about our visit and your time in completing this!    Mishell Donalson L. Jeanmarie Hubert, MSN, APRN, AGNP-C Adult-Gerontology Nurse Practitioner [facility] at [facility]

## 2023-10-01 NOTE — Progress Notes (Addendum)
 Referring Provider: Toma Deiters, MD Primary Care Physician:  Toma Deiters, MD Primary GI Physician: Dr. Levon Hedger   Chief Complaint  Patient presents with   Gastroesophageal Reflux    Patient here today for a follow up on his Genella Rife. He takes Omeprazole 40 mg daily and has not been taking famotidine at bedtime. He also wants to ask some questions regarding his scheduled Hiatal hernia.   HPI:   Kyle Collins is a 38 y.o. male with past medical history of ADHD, anxiety, bipolar disorder, depression, diabetes c/b neuropathy, GERD, hypertension, sleep apnea, hyperlipidemia   Patient presenting today for follow up of: Right upper Abdominal pain Duodenitis   Patient last seen in December 2024, at that time having ongoing RUQ/R flank pain x12 months. Taking zofran with some improvement. Some burning in mesogastric area as well as some belching and abdominal distention.   Patient recommended to schedule EGD, start bentyl 10mg  q12h PRN, start zofran q8h PRN, consider GES if EGD unremarkable.  EGD 08/18/23 - Normal esophagus. - Normal stomach. Biopsied. - Normal examined duodenum. Chronic peptic duodenitis  Patient advised to follow up with urology for further workup if pain persisted  Present:  Patient states that he continues to have  RLQ pain. He has seen urology who stated that he has crystals in his urine but they do not see any obvious renal stones on recent KUB that would be causing an issue though CT done 2/12 shows a right uretic calculus causing mild proximal obstructive uropathy. He has some urinary symptoms but notes he also has DM so thinks frequent urination  may be secondary to this. He has had no hematuria and does not feel as though he has passed a stone recently.   He continues to have RUQ Pain that is constant. Feels RUQ pain has improved some over time. Not taking famotidine as he notes this was a hospital Rx and was not sure he should keep taking it.  Eating does not  seem to change his pain. He is concerned about a hiatal hernia that was mentioned on CT renal stone study on 2/12, though EGD done in January did not note a hiatal hernia. He has history of constipation. He had some nausea today but no vomiting and notes he has not had much nausea recently. Denies early satiety.   Notably He reports losing his appetite after he had a tetanus shot a few years ago, has lost about 90 pounds since then.   CT of the abdomen and pelvis with IV 05/2023 contrast showed status postcholecystectomy and small bilateral renal calculi without obstruction.  CT of the abdomen and pelvis with contrast on 04/02/2023 which was only abnormal for nonobstructive nephrolithiasis.  CT of the abdomen and pelvis without contrast on 04/22/2023 showed presence of nonobstructive renal calculi and diverticulosis.  Notably, he has had multiple ultrasounds and CT of the abdomen and pelvis examinations at least since 2019 which have been unremarkable for any etiology of abdominal pain and nausea.    Filed Weights   10/01/23 1049  Weight: 285 lb 12.8 oz (129.6 kg)     Past Medical History:  Diagnosis Date   ADHD (attention deficit hyperactivity disorder)    Anxiety    Bipolar disorder (HCC)    Depression    Diabetes mellitus    GERD (gastroesophageal reflux disease)    Headache    Hemochromatosis 2019   Hypertension    Polycythemia    Sleep apnea  Thyroid disease     Past Surgical History:  Procedure Laterality Date   BIOPSY  12/14/2020   Procedure: BIOPSY;  Surgeon: Dolores Frame, MD;  [facility];  Service: Gastroenterology;;   BIOPSY  08/18/2023   Procedure: BIOPSY;  Surgeon: Dolores Frame, MD;  [facility]: [facility];  Service: Gastroenterology;;   CHOLECYSTECTOMY N/A 11/24/2018   Procedure: LAPAROSCOPIC CHOLECYSTECTOMY;  Surgeon: Franky Macho, MD;  Location: [facility];  Service: General;  Laterality: N/A;   CIRCUMCISION N/A 09/14/2017    Procedure: CIRCUMCISION;  Surgeon: Malen Gauze, MD;  Location: [facility];  Service: Urology;  Laterality: N/A;  1 HR 336 [phone] - He has a Child psychotherapist that needs to be called if changes made Vikki Ports @ [phone] MEDICARE-[id number] MEDICAID-[id number] N   ESOPHAGOGASTRODUODENOSCOPY (EGD) WITH PROPOFOL N/A 12/14/2020   castaneda:Normal esophagus. z line irregular, normal stomach, duodenal erosions without bleeding, neg h pylori, biopsies unremarkable.   ESOPHAGOGASTRODUODENOSCOPY (EGD) WITH PROPOFOL N/A 08/18/2023   Procedure: ESOPHAGOGASTRODUODENOSCOPY (EGD) WITH PROPOFOL;  Surgeon: Dolores Frame, MD;  [facility];  Service: Gastroenterology;  Laterality: N/A;  2:30pm;asa 3   HERNIA REPAIR     scrotum   MAXILLARY ANTROSTOMY Right 08/18/2018   Procedure: ENDOSCOPIC MAXILLARY RIGHT ANTROSTOMY WITH TISSUE REMOVAL;  Surgeon: Newman Pies, MD;  Location: [facility];  Service: ENT;  Laterality: Right;   NASAL SEPTOPLASTY W/ TURBINOPLASTY Bilateral 08/18/2018   Procedure: NASAL SEPTOPLASTY WITH TURBINATE REDUCTION;  Surgeon: Newman Pies, MD;  Location: [facility];  Service: ENT;  Laterality: Bilateral;   NASAL SEPTUM SURGERY  08/18/2018   ORIF SHOULDER FRACTURE Right 02/28/2016   Procedure: OPEN REDUCTION INTERNAL FIXATION (ORIF) SHOULDER FRACTURE;  Surgeon: Vickki Hearing, MD;  Location: [facility];  Service: Orthopedics;  Laterality: Right;   SHOULDER CLOSED REDUCTION Right 12/04/2015   Procedure: CLOSED REDUCTION SHOULDER;  Surgeon: Vickki Hearing, MD;  Location: [facility];  Service: Orthopedics;  Laterality: Right;   TOOTH EXTRACTION  spring 2016   top left     Current Outpatient Medications  Medication Sig Dispense Refill   amLODipine (NORVASC) 10 MG tablet Take 1 tablet (10 mg total) by mouth daily. 30 tablet 1   atorvastatin (LIPITOR) 10 MG tablet Take 10 mg by mouth daily.     chlorthalidone (HYGROTON) 50 MG tablet Take 50 mg by mouth daily.     dicyclomine (BENTYL) 10 MG  capsule Take 1 capsule (10 mg total) by mouth every 12 (twelve) hours as needed. 180 capsule 3   gemfibrozil (LOPID) 600 MG tablet Take 600 mg by mouth 2 (two) times daily.     Insulin Glargine (BASAGLAR KWIKPEN) 100 UNIT/ML Inject 15 Units into the skin daily.     Insulin Pen Needle (ABOUTTIME PEN NEEDLE) 31G X 8 MM MISC Use as needed to inject insulin daily as instructed 150 each 1   JARDIANCE 10 MG TABS tablet Take 10 mg by mouth daily.     lisinopril (PRINIVIL,ZESTRIL) 40 MG tablet Take 1 tablet (40 mg total) by mouth daily. 30 tablet 1   metFORMIN (GLUCOPHAGE) 500 MG tablet Take 1,000 mg by mouth 2 (two) times daily.     metoprolol succinate (TOPROL-XL) 100 MG 24 hr tablet Take 100 mg by mouth daily.     omeprazole (PRILOSEC) 40 MG capsule Take 1 capsule (40 mg total) by mouth daily. 90 capsule 3   ondansetron (ZOFRAN-ODT) 4 MG disintegrating tablet Take 1 tablet (4 mg total) by mouth every 8 (  eight) hours as needed for nausea or vomiting. 90 tablet 3   Oxycodone HCl 10 MG TABS Take 10 mg by mouth 2 (two) times daily.     potassium chloride SA (KLOR-CON) 20 MEQ tablet Take 20 mEq by mouth daily.     tamsulosin (FLOMAX) 0.4 MG CAPS capsule Take 1 capsule (0.4 mg total) by mouth daily for 14 days. 14 capsule 0   famotidine (PEPCID) 20 MG tablet Take 20 mg by mouth 2 (two) times daily. (Patient not taking: Reported on 10/01/2023)     ondansetron (ZOFRAN) 4 MG tablet Take 1 tablet (4 mg total) by mouth every 6 (six) hours. (Patient not taking: Reported on 09/29/2023) 12 tablet 0   No current facility-administered medications for this visit.    Allergies as of 10/01/2023 - Review Complete 10/01/2023  Allergen Reaction Noted   Methylphenidate derivatives Other (See Comments) 02/17/2011    Social History   Socioeconomic History   Marital status: Single    Spouse name: Not on file   Number of children: Not on file   Years of education: Not on file   Highest education level: Not on file   Occupational History   Not on file  Tobacco Use   Smoking status: Former    Current packs/day: 0.00    Average packs/day: 0.5 packs/day for 5.0 years (2.5 ttl pk-yrs)    Types: E-cigarettes, Cigarettes    Start date: 02/25/2004    Quit date: 02/24/2009    Years since quitting: 14.6   Smokeless tobacco: Never  Vaping Use   Vaping status: Former   Quit date: 12/13/2018  Substance and Sexual Activity   Alcohol use: Not Currently    Comment: once year   Drug use: No    Comment: Hemp and cbd   Sexual activity: Never  Other Topics Concern   Not on file  Social History Narrative   Not on file   Social Drivers of Health   Financial Resource Strain: Low Risk  (04/18/2022)   Received from [facility], [facility]   Overall Financial Resource Strain (CARDIA)    Difficulty of Paying Living Expenses: Not hard at all  Food Insecurity: No Food Insecurity (04/18/2022)   Received from [facility], [facility]   Hunger Vital Sign    Worried About Running Out of Food in the Last Year: Never true    Ran Out of Food in the Last Year: Never true  Transportation Needs: No Transportation Needs (04/18/2022)   Received from [facility], [facility]   [facility] - Transportation    Lack of Transportation (Medical): No    Lack of Transportation (Non-Medical): No  Physical Activity: Inactive (04/18/2022)   Received from [facility], [facility]   Exercise Vital Sign    Days of Exercise per Week: 0 days    Minutes of Exercise per Session: 0 min  Stress: No Stress Concern Present (04/18/2022)   Received from [facility], [facility] of Occupational Health - Occupational Stress Questionnaire    Feeling of Stress : Not at all  Social Connections: Socially Isolated (04/18/2022)   Received from [facility], [facility]   Social Connection and Isolation Panel [NHANES]    Frequency of Communication with Friends and Family: Three times a week     Frequency of Social Gatherings with Friends and Family: Once a week    Attends Religious Services: Never  Active Member of Clubs or Organizations: No    Attends Banker Meetings: Never    Marital Status: Never married    Review of systems General: negative for malaise, night sweats, fever, chills, weight loss Neck: Negative for lumps, goiter, pain and significant neck swelling Resp: Negative for cough, wheezing, dyspnea at rest CV: Negative for chest pain, leg swelling, palpitations, orthopnea GI: denies melena, hematochezia, nausea, vomiting, diarrhea, constipation, dysphagia, odyonophagia, early satiety or unintentional weight loss. +right abdominal pain  The remainder of the review of systems is noncontributory.  Physical Exam: BP (!) 159/110 (BP Location: Right Arm, Patient Position: Sitting, Cuff Size: Large)   Pulse 90   Temp 99.4 F (37.4 C) (Temporal)   Ht 5\' 11"  (1.803 m)   Wt 285 lb 12.8 oz (129.6 kg)   BMI 39.86 kg/m  General:   Alert and oriented. No distress noted. Pleasant and cooperative.  Head:  Normocephalic and atraumatic. Eyes:  Conjuctiva clear without scleral icterus. Mouth:  Oral mucosa pink and moist. Good dentition. No lesions. Heart: Normal rate and rhythm, s1 and s2 heart sounds present.  Lungs: Clear lung sounds in all lobes. Respirations equal and unlabored. Abdomen:  +BS, soft, and non-distended. TTP to diffuse RIght abdomen. No rebound or guarding. No HSM or masses noted. Neurologic:  Alert and  oriented x4 Psych:  Alert and cooperative. Normal mood and affect.  Invalid input(s): "6 MONTHS"   ASSESSMENT: Kyle Collins is a 38 y.o. male presenting today for follow up of right sided abdominal pain   Right sided abdominal pain has been ongoing, previously with more RUQ pain and now with RLQ pain as well. Recent EGD with peptic duodenitis, he was started on omeprazole 40mg  daily and notes he has had some improvement in his RUQ over  time but does not attribute this to the omeprazole. Eating does not change his pain. Not really having much nausea or vomiting at this time. Took bentyl in the past but did not note much relief from this. Multiple CT scans done of the abdomen over the past 6 months without any concerns for malignancy. He has had findings of a small sliding hiatal hernia which notably was not seen on recent EGD, as well as right ureteral calculus which he is following with urology for. At this time etiology of his abdominal pain is unclear. Unlikely duodenitis would cause such significant RUQ discomfort, though we can try a course of carafate to see if this provides any relief. I discussed with him other differentials such as function abdominal pain and radiation of pain from the spine as he has known spinal issues and endorses more back pain recently. I encouraged him to follow up with his PCP regarding back pain/referral to spinal specialist as well as weight loss.   Patient was reassured that given findings of small sliding hiatal hernia on CT and no findings of this on EGD, hiatal hernia is likely very small and unlikely to cause him symptoms, unsure if general surgery would be willing to repair this given the above aspects, though it appears urology has referred him to them.   PLAN:  -continue omeprazole 40mg  daily -continue zofran as needed -follow up with urology -discuss pain radiation from back with PCP/spinal specialist   -Carafate 1g QID   All questions were answered, patient verbalized understanding and is in agreement with plan as outlined above.   Follow Up: 4 months   Larenda Reedy L. Jeanmarie Hubert, MSN, APRN, AGNP-C Adult-Gerontology Nurse  Practitioner [facility]  I have reviewed the note and agree with the APP's assessment as described in this progress note  If after evaluation with PCP and spinal specialist, there is no source of pain causing his symptoms, can consider trial of TCA  at low-dose for functional abdominal pain.  Katrinka Blazing, MD Gastroenterology and Hepatology [facility] Gastroenterology

## 2023-10-05 ENCOUNTER — Telehealth (INDEPENDENT_AMBULATORY_CARE_PROVIDER_SITE_OTHER): Payer: Self-pay | Admitting: *Deleted

## 2023-10-05 NOTE — Telephone Encounter (Signed)
 Patient notified

## 2023-10-05 NOTE — Telephone Encounter (Signed)
 Pt called and left vm that sucralfate was making him vomit. I called patient back to get more information and then he said he now does not think it is the suralfate. He started it yesterday and took 2 yesterday and vomited two times. Did not take anymore and he vomited twice today. He said he had this same thing happen back in October. He saw urology and was told no kidney stone so they put in referral to Dr. Henreitta Leber for hiatial hernia.    [phone]

## 2023-10-06 ENCOUNTER — Emergency Department (HOSPITAL_COMMUNITY)
Admission: EM | Admit: 2023-10-06 | Discharge: 2023-10-07 | Disposition: A | Discharge status: Home / Self Care | Attending: Emergency Medicine | Admitting: Emergency Medicine

## 2023-10-06 ENCOUNTER — Emergency Department (HOSPITAL_COMMUNITY)

## 2023-10-06 ENCOUNTER — Encounter (HOSPITAL_COMMUNITY): Payer: Self-pay | Admitting: Emergency Medicine

## 2023-10-06 ENCOUNTER — Other Ambulatory Visit: Payer: Self-pay

## 2023-10-06 DIAGNOSIS — W19XXXA Unspecified fall, initial encounter: Secondary | ICD-10-CM | POA: Diagnosis not present

## 2023-10-06 DIAGNOSIS — R55 Syncope and collapse: Secondary | ICD-10-CM | POA: Diagnosis present

## 2023-10-06 DIAGNOSIS — S20229A Contusion of unspecified back wall of thorax, initial encounter: Secondary | ICD-10-CM | POA: Insufficient documentation

## 2023-10-06 DIAGNOSIS — S20219A Contusion of unspecified front wall of thorax, initial encounter: Secondary | ICD-10-CM

## 2023-10-06 DIAGNOSIS — R002 Palpitations: Secondary | ICD-10-CM

## 2023-10-06 LAB — COMPREHENSIVE METABOLIC PANEL
ALT: 24 U/L (ref 0–44)
AST: 21 U/L (ref 15–41)
Albumin: 4.6 g/dL (ref 3.5–5.0)
Alkaline Phosphatase: 64 U/L (ref 38–126)
Anion gap: 15 (ref 5–15)
BUN: 16 mg/dL (ref 6–20)
CO2: 23 mmol/L (ref 22–32)
Calcium: 10.1 mg/dL (ref 8.9–10.3)
Chloride: 98 mmol/L (ref 98–111)
Creatinine, Ser: 1.01 mg/dL (ref 0.61–1.24)
GFR, Estimated: >60 mL/min (ref >60)
Glucose, Bld: 141 mg/dL — ABNORMAL HIGH (ref 70–99)
Potassium: 4.4 mmol/L (ref 3.5–5.1)
Sodium: 136 mmol/L (ref 135–145)
Total Bilirubin: 2.5 mg/dL — ABNORMAL HIGH (ref 0.0–1.2)
Total Protein: 8 g/dL (ref 6.5–8.1)

## 2023-10-06 LAB — CBC
HCT: 49.5 % (ref 39.0–52.0)
Hemoglobin: 16.7 g/dL (ref 13.0–17.0)
MCH: 30.1 pg (ref 26.0–34.0)
MCHC: 33.7 g/dL (ref 30.0–36.0)
MCV: 89.4 fL (ref 80.0–100.0)
Platelets: 256 10*3/uL (ref 150–400)
RBC: 5.54 MIL/uL (ref 4.22–5.81)
RDW: 12.5 % (ref 11.5–15.5)
WBC: 8.4 10*3/uL (ref 4.0–10.5)
nRBC: 0 % (ref 0.0–0.2)

## 2023-10-06 LAB — TROPONIN I (HIGH SENSITIVITY)
Troponin I (High Sensitivity): 6 ng/L (ref <18)
Troponin I (High Sensitivity): 6 ng/L (ref <18)

## 2023-10-06 MED ORDER — NAPROXEN 500 MG PO TABS: 500.0000 mg | ORAL_TABLET | Freq: Two times a day (BID) | ORAL | 0 refills | Status: AC

## 2023-10-06 MED ORDER — METHOCARBAMOL 500 MG PO TABS
500.0000 mg | ORAL_TABLET | Freq: Once | ORAL | Status: AC
  Administered 2023-10-06: 500 mg via ORAL
  Filled 2023-10-06: qty 1

## 2023-10-06 MED ORDER — NAPROXEN 250 MG PO TABS
500.0000 mg | ORAL_TABLET | Freq: Once | ORAL | Status: AC
  Administered 2023-10-06: 500 mg via ORAL
  Filled 2023-10-06: qty 2

## 2023-10-06 MED ORDER — OXYCODONE HCL 5 MG PO TABS
10.0000 mg | ORAL_TABLET | Freq: Once | ORAL | Status: AC
  Administered 2023-10-06: 10 mg via ORAL
  Filled 2023-10-06: qty 2

## 2023-10-06 MED ORDER — LIDOCAINE 4 % EX PTCH: 1.0000 | MEDICATED_PATCH | Freq: Two times a day (BID) | CUTANEOUS | 0 refills | Status: DC

## 2023-10-06 MED ORDER — METHOCARBAMOL 500 MG PO TABS: 500.0000 mg | ORAL_TABLET | Freq: Two times a day (BID) | ORAL | 0 refills | Status: AC

## 2023-10-06 MED ORDER — LIDOCAINE 5 % EX PTCH
1.0000 | MEDICATED_PATCH | CUTANEOUS | Status: DC
  Administered 2023-10-06: 1 via TRANSDERMAL
  Filled 2023-10-06: qty 1
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
## 2023-10-07 NOTE — ED Notes (Signed)
 Checked in on patient who is sitting in bed, relaxed. States his pain is better but would still like to speak with the doctor before he leaves

## 2023-10-07 NOTE — ED Notes (Signed)
 Went to discharge patient. Patient is bent over in pain and stating that his kidneys hurt and he "feels like he is having a kidney stone on each side". Patient requests to speak with doctor. MD notified

## 2023-10-08 ENCOUNTER — Emergency Department (HOSPITAL_COMMUNITY)

## 2023-10-08 ENCOUNTER — Other Ambulatory Visit: Payer: Self-pay

## 2023-10-08 ENCOUNTER — Emergency Department (HOSPITAL_COMMUNITY): Admission: EM | Admit: 2023-10-08 | Discharge: 2023-10-09 | Disposition: A | Discharge status: Home / Self Care

## 2023-10-08 ENCOUNTER — Encounter (HOSPITAL_COMMUNITY): Payer: Self-pay

## 2023-10-08 DIAGNOSIS — Z79899 Other long term (current) drug therapy: Secondary | ICD-10-CM | POA: Diagnosis not present

## 2023-10-08 DIAGNOSIS — R079 Chest pain, unspecified: Secondary | ICD-10-CM | POA: Diagnosis present

## 2023-10-08 DIAGNOSIS — R002 Palpitations: Secondary | ICD-10-CM | POA: Insufficient documentation

## 2023-10-08 DIAGNOSIS — Z7984 Long term (current) use of oral hypoglycemic drugs: Secondary | ICD-10-CM | POA: Diagnosis not present

## 2023-10-08 DIAGNOSIS — I10 Essential (primary) hypertension: Secondary | ICD-10-CM | POA: Diagnosis not present

## 2023-10-08 DIAGNOSIS — Z794 Long term (current) use of insulin: Secondary | ICD-10-CM | POA: Insufficient documentation

## 2023-10-08 DIAGNOSIS — E119 Type 2 diabetes mellitus without complications: Secondary | ICD-10-CM | POA: Diagnosis not present

## 2023-10-08 LAB — TROPONIN I (HIGH SENSITIVITY): Troponin I (High Sensitivity): 7 ng/L (ref <18)

## 2023-10-08 LAB — BASIC METABOLIC PANEL
Anion gap: 10 (ref 5–15)
BUN: 16 mg/dL (ref 6–20)
CO2: 28 mmol/L (ref 22–32)
Calcium: 9.7 mg/dL (ref 8.9–10.3)
Chloride: 100 mmol/L (ref 98–111)
Creatinine, Ser: 0.92 mg/dL (ref 0.61–1.24)
GFR, Estimated: >60 mL/min (ref >60)
Glucose, Bld: 124 mg/dL — ABNORMAL HIGH (ref 70–99)
Potassium: 4.4 mmol/L (ref 3.5–5.1)
Sodium: 138 mmol/L (ref 135–145)

## 2023-10-08 LAB — CBC
HCT: 46.2 % (ref 39.0–52.0)
Hemoglobin: 15.8 g/dL (ref 13.0–17.0)
MCH: 30.4 pg (ref 26.0–34.0)
MCHC: 34.2 g/dL (ref 30.0–36.0)
MCV: 89 fL (ref 80.0–100.0)
Platelets: 232 10*3/uL (ref 150–400)
RBC: 5.19 MIL/uL (ref 4.22–5.81)
RDW: 12.5 % (ref 11.5–15.5)
WBC: 6.8 10*3/uL (ref 4.0–10.5)
nRBC: 0 % (ref 0.0–0.2)

## 2023-10-08 LAB — D-DIMER, QUANTITATIVE: D-Dimer, Quant: <0.27 ug{FEU}/mL (ref 0.00–0.50)

## 2023-10-08 MED ORDER — IOHEXOL 350 MG/ML SOLN
75.0000 mL | Freq: Once | INTRAVENOUS | Status: AC | PRN
  Administered 2023-10-08: 75 mL via INTRAVENOUS

## 2023-10-08 MED ORDER — LACTATED RINGERS IV BOLUS
1000.0000 mL | Freq: Once | INTRAVENOUS | Status: AC
Start: 2023-10-08 — End: 2023-10-09
  Administered 2023-10-08: 1000 mL via INTRAVENOUS

## 2023-10-08 MED ORDER — KETOROLAC TROMETHAMINE 15 MG/ML IJ SOLN
15.0000 mg | Freq: Once | INTRAMUSCULAR | Status: AC
  Administered 2023-10-08: 15 mg via INTRAVENOUS
  Filled 2023-10-08: qty 1

## 2023-10-08 NOTE — ED Notes (Signed)
 Pt reports taking oxycodone PTA and placing a Lidocaine patch on his back PTA w/o relief from pain. Pt reports he lives alone and is unsure if he is able to take care of himself.

## 2023-10-08 NOTE — ED Provider Notes (Signed)
 [place] [facility]ENC[facility] DEPARTMENT AT [facility] Provider Note   CSN: [id number] Arrival date & time: 10/08/23  1737     History {Add pertinent medical, surgical, social history, OB history to HPI:1} Chief Complaint  Patient presents with   Chest Pain    Kyle Collins is a 38 y.o. male.  38 year old male with past medical history of hypertension, hyperlipidemia, and diabetes presenting to the emergency department today with chest pain.  Patient states he has been having pain in his chest now for the past few weeks.  He states that the pain is mostly in the front but occasionally does occur in the back as well.  He reports that it is worse with movement.  He was seen here few days ago and had reassuring workup.  He states that he was at home today when this seemed to get worse.  His predominant presenting symptom however was palpitations.  The patient states that he started with some palpitations earlier today.  He states the chest pain soon followed.  He states that he was very lightheaded with this.  He denies a history of DVT or pulmonary embolism, recent surgeries, recent travel.  The patient states that he has been having issues with this on and off now for the past few weeks.  He denies any focal weakness, numbness, or tingling.   Chest Pain Associated symptoms: palpitations        Home Medications Prior to Admission medications   Medication Sig Start Date End Date Taking? Authorizing Provider  amLODipine (NORVASC) 10 MG tablet Take 1 tablet (10 mg total) by mouth daily. 11/25/18   Vassie Loll, MD  atorvastatin (LIPITOR) 10 MG tablet Take 10 mg by mouth daily. 05/08/20   [provider]  chlorthalidone (HYGROTON) 50 MG tablet Take 50 mg by mouth daily. 05/18/19   [provider]  dicyclomine (BENTYL) 10 MG capsule Take 1 capsule (10 mg total) by mouth every 12 (twelve) hours as needed. 09/25/23   Dolores Frame, MD  gemfibrozil (LOPID) 600  MG tablet Take 600 mg by mouth 2 (two) times daily. 05/25/19   [provider]  Insulin Glargine (BASAGLAR KWIKPEN) 100 UNIT/ML Inject 15 Units into the skin daily.    [provider]  Insulin Pen Needle (ABOUTTIME PEN NEEDLE) 31G X 8 MM MISC Use as needed to inject insulin daily as instructed 11/24/18   Vassie Loll, MD  JARDIANCE 10 MG TABS tablet Take 10 mg by mouth daily. 03/08/20   [provider]  lidocaine 4 % Place 1 patch onto the skin 2 (two) times daily. 10/06/23   Derwood Kaplan, MD  lisinopril (PRINIVIL,ZESTRIL) 40 MG tablet Take 1 tablet (40 mg total) by mouth daily. 06/26/14   Bethann Berkshire, MD  metFORMIN (GLUCOPHAGE) 500 MG tablet Take 1,000 mg by mouth 2 (two) times daily. 05/18/19   [provider]  methocarbamol (ROBAXIN) 500 MG tablet Take 1 tablet (500 mg total) by mouth 2 (two) times daily. 10/06/23   Derwood Kaplan, MD  metoprolol succinate (TOPROL-XL) 100 MG 24 hr tablet Take 100 mg by mouth daily. 05/18/20   [provider]  naproxen (NAPROSYN) 500 MG tablet Take 1 tablet (500 mg total) by mouth 2 (two) times daily. 10/06/23   Derwood Kaplan, MD  omeprazole (PRILOSEC) 40 MG capsule Take 1 capsule (40 mg total) by mouth daily. 02/28/21   Carlan, Chelsea L, NP  ondansetron (ZOFRAN-ODT) 4 MG disintegrating tablet Take 1 tablet (4 mg  total) by mouth every 8 (eight) hours as needed for nausea or vomiting. 07/02/23   Dolores Frame, MD  Oxycodone HCl 10 MG TABS Take 10 mg by mouth 2 (two) times daily. 09/18/23   [provider]  potassium chloride SA (KLOR-CON) 20 MEQ tablet Take 20 mEq by mouth daily.    [provider]  sucralfate (CARAFATE) 1 g tablet Take 1 tablet (1 g total) by mouth 4 (four) times daily -  with meals and at bedtime. Dissolve tablet in 30ml of water, mix and drink slurry 10/01/23   Carlan, Chelsea L, NP  tamsulosin (FLOMAX) 0.4 MG CAPS capsule Take 1 capsule (0.4 mg total) by mouth daily for  14 days. 09/29/23 10/13/23  Donnita Falls, FNP      Allergies    Methylphenidate derivatives    Review of Systems   Review of Systems  Cardiovascular:  Positive for chest pain and palpitations.    Physical Exam Updated Vital Signs BP (!) 155/103 (BP Location: Right Arm)   Pulse (!) 108   Temp 98.5 F (36.9 C) (Oral)   Resp (!) 22   Ht 5\' 11"  (1.803 m)   Wt 132 kg   SpO2 99%   BMI 40.59 kg/m  Physical Exam Vitals and nursing note reviewed.   Gen: NAD Eyes: PERRL, EOMI HEENT: no oropharyngeal swelling Neck: trachea midline Resp: clear to auscultation bilaterally Card: RRR, no murmurs, rubs, or gallops, patient reports tenderness over his chest wall with palpation Abd: nontender, nondistended Extremities: no calf tenderness, no edema Vascular: 2+ radial pulses bilaterally, 2+ DP pulses bilaterally Neuro: Cranial nerves intact, equal strength and sensation throughout bilateral upper and lower extremities with no dysmetria on finger-to-nose testing Skin: no rashes Psyc: acting appropriately   ED Results / Procedures / Treatments   Labs (all labs ordered are listed, but only abnormal results are displayed) Labs Reviewed  CBC  BASIC METABOLIC PANEL  D-DIMER, QUANTITATIVE  TROPONIN I (HIGH SENSITIVITY)    EKG EKG Interpretation Date/Time:  Thursday October 08 2023 20:13:19 EDT Ventricular Rate:  95 PR Interval:  128 QRS Duration:  89 QT Interval:  339 QTC Calculation: 427 R Axis:   66  Text Interpretation: Sinus rhythm Confirmed by Beckey Downing [phone]) on 10/08/2023 8:24:16 PM  Radiology DG Chest 2 View Result Date: 10/08/2023 CLINICAL DATA:  Chest pain EXAM: CHEST - 2 VIEW COMPARISON:  10/06/2023 FINDINGS: The heart size and mediastinal contours are within normal limits. Both lungs are clear. The visualized skeletal structures are unremarkable. IMPRESSION: No active cardiopulmonary disease. Electronically Signed   By: Jasmine Pang M.D.   On: 10/08/2023 21:07    DG Ribs Unilateral W/Chest Left Result Date: 10/06/2023 CLINICAL DATA:  Left rib pain. EXAM: LEFT RIBS AND CHEST - 3+ VIEW COMPARISON:  Chest radiograph earlier today FINDINGS: No fracture or other bone lesions are seen involving the ribs. There is no pneumothorax or pleural effusion. Both lungs are clear. Heart size and mediastinal contours are within normal limits. IMPRESSION: Negative radiographs of the chest and left ribs. Electronically Signed   By: Narda Rutherford M.D.   On: 10/06/2023 23:36    Procedures Procedures  {Document cardiac monitor, telemetry assessment procedure when appropriate:1}  Medications Ordered in ED Medications - No data to display  ED Course/ Medical Decision Making/ A&P   {   Click here for ABCD2, HEART and other calculatorsREFRESH Note before signing :1}  Medical Decision Making 38 year old male with past medical history of hypertension, hyperlipidemia, and diabetes presenting to the emergency department today with intermittent chest pain and palpitations.  This somewhat reproducible here on exam.  I will further evaluate him here with basic labs Wels and EKG, chest x-ray, and troponin for further evaluation for ACS, pulmonary edema, pulmonary infiltrates, or pneumothorax.  Also obtain a D-dimer as the patient was tachycardic here on arrival.  He did have reassuring workup a few days ago.  Suspicion for aortic dissection is low at this time given the waxing and waning character of his symptoms and reassuring neurovascular exam.  I will give the patient IV fluids here for the tachycardia as well as Toradol for pain.  Amount and/or Complexity of Data Reviewed Labs: ordered. Radiology: ordered.  Risk Prescription drug management.   ***  {Document critical care time when appropriate:1} {Document review of labs and clinical decision tools ie heart score, Chads2Vasc2 etc:1}  {Document your independent review of radiology images,  and any outside records:1} {Document your discussion with family members, caretakers, and with consultants:1} {Document social determinants of health affecting pt's care:1} {Document your decision making why or why not admission, treatments were needed:1} Final Clinical Impression(s) / ED Diagnoses Final diagnoses:  None    Rx / DC Orders ED Discharge Orders     None

## 2023-10-08 NOTE — ED Triage Notes (Signed)
 Pt arrived via POV c/o chest and back pain for over the past month. Pt reports being evaluated here recently for the same complaint. Pt presents anxious in Triage.

## 2023-10-09 DIAGNOSIS — R079 Chest pain, unspecified: Secondary | ICD-10-CM | POA: Diagnosis not present

## 2023-10-09 LAB — TROPONIN I (HIGH SENSITIVITY): Troponin I (High Sensitivity): 8 ng/L (ref <18)

## 2023-10-09 MED ORDER — NAPROXEN 500 MG PO TABS: 500.0000 mg | ORAL_TABLET | Freq: Two times a day (BID) | ORAL | 0 refills | Status: AC

## 2023-10-09 NOTE — Discharge Instructions (Signed)
 Your workup today was reassuring.  Please take naproxen as needed for pain and follow-up with the cardiologist at the number provided.  Return to the ER for worsening symptoms.

## 2023-10-20 ENCOUNTER — Ambulatory Visit: Admitting: Urology

## 2023-10-24 ENCOUNTER — Encounter (HOSPITAL_COMMUNITY): Payer: Self-pay

## 2023-10-24 ENCOUNTER — Other Ambulatory Visit: Payer: Self-pay

## 2023-10-24 ENCOUNTER — Emergency Department (HOSPITAL_COMMUNITY)

## 2023-10-24 ENCOUNTER — Emergency Department (HOSPITAL_COMMUNITY)
Admission: EM | Admit: 2023-10-24 | Discharge: 2023-10-24 | Disposition: A | Discharge status: Home / Self Care | Attending: Emergency Medicine | Admitting: Emergency Medicine

## 2023-10-24 DIAGNOSIS — Z794 Long term (current) use of insulin: Secondary | ICD-10-CM | POA: Diagnosis not present

## 2023-10-24 DIAGNOSIS — H81399 Other peripheral vertigo, unspecified ear: Secondary | ICD-10-CM | POA: Diagnosis not present

## 2023-10-24 DIAGNOSIS — E119 Type 2 diabetes mellitus without complications: Secondary | ICD-10-CM | POA: Diagnosis not present

## 2023-10-24 DIAGNOSIS — R42 Dizziness and giddiness: Secondary | ICD-10-CM | POA: Diagnosis present

## 2023-10-24 DIAGNOSIS — Z7984 Long term (current) use of oral hypoglycemic drugs: Secondary | ICD-10-CM | POA: Insufficient documentation

## 2023-10-24 DIAGNOSIS — Z79899 Other long term (current) drug therapy: Secondary | ICD-10-CM | POA: Insufficient documentation

## 2023-10-24 DIAGNOSIS — I10 Essential (primary) hypertension: Secondary | ICD-10-CM | POA: Diagnosis not present

## 2023-10-24 LAB — CBC WITH DIFFERENTIAL/PLATELET
Abs Immature Granulocytes: 0.02 10*3/uL (ref 0.00–0.07)
Basophils Absolute: 0 10*3/uL (ref 0.0–0.1)
Basophils Relative: 1 %
Eosinophils Absolute: 0.1 10*3/uL (ref 0.0–0.5)
Eosinophils Relative: 1 %
HCT: 45.8 % (ref 39.0–52.0)
Hemoglobin: 16 g/dL (ref 13.0–17.0)
Immature Granulocytes: 0 %
Lymphocytes Relative: 20 %
Lymphs Abs: 1.5 10*3/uL (ref 0.7–4.0)
MCH: 30.4 pg (ref 26.0–34.0)
MCHC: 34.9 g/dL (ref 30.0–36.0)
MCV: 86.9 fL (ref 80.0–100.0)
Monocytes Absolute: 0.5 10*3/uL (ref 0.1–1.0)
Monocytes Relative: 7 %
Neutro Abs: 5.6 10*3/uL (ref 1.7–7.7)
Neutrophils Relative %: 71 %
Platelets: 239 10*3/uL (ref 150–400)
RBC: 5.27 MIL/uL (ref 4.22–5.81)
RDW: 13 % (ref 11.5–15.5)
WBC: 7.8 10*3/uL (ref 4.0–10.5)
nRBC: 0 % (ref 0.0–0.2)

## 2023-10-24 LAB — URINALYSIS, ROUTINE W REFLEX MICROSCOPIC
Bilirubin Urine: NEGATIVE
Glucose, UA: NEGATIVE mg/dL
Hgb urine dipstick: NEGATIVE
Ketones, ur: 20 mg/dL — AB
Leukocytes,Ua: NEGATIVE
Nitrite: NEGATIVE
Protein, ur: NEGATIVE mg/dL
Specific Gravity, Urine: 1.009 (ref 1.005–1.030)
pH: 6 (ref 5.0–8.0)

## 2023-10-24 LAB — COMPREHENSIVE METABOLIC PANEL WITH GFR
ALT: 25 U/L (ref 0–44)
AST: 21 U/L (ref 15–41)
Albumin: 4.3 g/dL (ref 3.5–5.0)
Alkaline Phosphatase: 53 U/L (ref 38–126)
Anion gap: 10 (ref 5–15)
BUN: 11 mg/dL (ref 6–20)
CO2: 25 mmol/L (ref 22–32)
Calcium: 9.4 mg/dL (ref 8.9–10.3)
Chloride: 102 mmol/L (ref 98–111)
Creatinine, Ser: 0.83 mg/dL (ref 0.61–1.24)
GFR, Estimated: >60 mL/min (ref >60)
Glucose, Bld: 176 mg/dL — ABNORMAL HIGH (ref 70–99)
Potassium: 3.6 mmol/L (ref 3.5–5.1)
Sodium: 137 mmol/L (ref 135–145)
Total Bilirubin: 2.4 mg/dL — ABNORMAL HIGH (ref 0.0–1.2)
Total Protein: 7.7 g/dL (ref 6.5–8.1)

## 2023-10-24 LAB — CBG MONITORING, ED: Glucose-Capillary: 163 mg/dL — ABNORMAL HIGH (ref 70–99)

## 2023-10-24 LAB — MAGNESIUM: Magnesium: 1.9 mg/dL (ref 1.7–2.4)

## 2023-10-24 MED ORDER — METOPROLOL SUCCINATE ER 50 MG PO TB24
100.0000 mg | ORAL_TABLET | Freq: Every day | ORAL | Status: DC
  Administered 2023-10-24: 100 mg via ORAL
  Filled 2023-10-24: qty 2

## 2023-10-24 MED ORDER — LACTATED RINGERS IV BOLUS: 1000.0000 mL | Freq: Once | INTRAVENOUS | Status: DC

## 2023-10-24 MED ORDER — LISINOPRIL 10 MG PO TABS: 40.0000 mg | ORAL_TABLET | Freq: Every day | ORAL | Status: DC

## 2023-10-24 MED ORDER — AMLODIPINE BESYLATE 5 MG PO TABS
10.0000 mg | ORAL_TABLET | Freq: Every day | ORAL | Status: DC
  Administered 2023-10-24: 10 mg via ORAL
  Filled 2023-10-24: qty 2

## 2023-10-24 MED ORDER — LISINOPRIL 10 MG PO TABS
40.0000 mg | ORAL_TABLET | Freq: Every day | ORAL | Status: DC
  Administered 2023-10-24: 40 mg via ORAL
  Filled 2023-10-24: qty 4

## 2023-10-24 MED ORDER — MECLIZINE HCL 25 MG PO TABS: 25.0000 mg | ORAL_TABLET | Freq: Three times a day (TID) | ORAL | 0 refills | Status: AC | PRN

## 2023-10-24 MED ORDER — LACTATED RINGERS IV BOLUS
2000.0000 mL | Freq: Once | INTRAVENOUS | Status: AC
  Administered 2023-10-24: 2000 mL via INTRAVENOUS

## 2023-10-24 MED ORDER — MECLIZINE HCL 12.5 MG PO TABS
25.0000 mg | ORAL_TABLET | Freq: Once | ORAL | Status: AC
  Administered 2023-10-24: 25 mg via ORAL
  Filled 2023-10-24: qty 2

## 2023-10-24 NOTE — ED Notes (Signed)
 Pt returned from CT. A,A,Ox3. In NAD.

## 2023-10-24 NOTE — ED Triage Notes (Signed)
 Pt reports increasing dizziness over the last several days, difficulty finding the right words to speak. H/O inner ear issues - popping and pain in the left ear.

## 2023-10-24 NOTE — ED Notes (Signed)
 Patient transported to CT via stretcher.

## 2023-10-24 NOTE — Discharge Instructions (Addendum)
 Continue your home medications as prescribed.  A prescription for medication called meclizine was sent to your pharmacy.  Take this as needed for dizziness.  Plenty of water to stay hydrated.  Follow-up with your PCP.  If you do have any new or worsening symptoms of concern, return to the emergency department.

## 2023-10-24 NOTE — ED Notes (Signed)
2nd liter of LR started.

## 2023-10-24 NOTE — ED Provider Notes (Signed)
 [place] [facility] DEPARTMENT AT [facility] Provider Note   CSN: [id number] Arrival date & time: 10/24/23  1102     History  Chief Complaint  Patient presents with   Dizziness    Kyle Collins is a 38 y.o. male.   Dizziness Associated symptoms: nausea   Patient presents for dizziness.  Medical history includes DM, HTN, sleep apnea, GERD, polycythemia, anxiety, depression, bipolar disorder, ADHD.  Over the past 4 days, he describes intermittent dizziness, worse with positional changes.  He had some popping and fullness sensation in area of left ear.  He denies any associated pain.  While playing videogames yesterday, patient had a brief episode of garbled speech.  He states that his sugar has been elevated at home and that he has felt dehydrated from polyuria.  He has been trying to drink as much water as he can.  He drank some Pedialyte earlier this morning.  Patient has not taken any of his medications today.  He often does not take his medications because he feels that he is on too many of them.     Home Medications Prior to Admission medications   Medication Sig Start Date End Date Taking? Authorizing Provider  meclizine (ANTIVERT) 25 MG tablet Take 1 tablet (25 mg total) by mouth 3 (three) times daily as needed for dizziness. 10/24/23  Yes Gloris Manchester, MD  amLODipine (NORVASC) 10 MG tablet Take 1 tablet (10 mg total) by mouth daily. 11/25/18   Vassie Loll, MD  atorvastatin (LIPITOR) 10 MG tablet Take 10 mg by mouth daily. 05/08/20   [provider]  chlorthalidone (HYGROTON) 50 MG tablet Take 50 mg by mouth daily. 05/18/19   [provider]  dicyclomine (BENTYL) 10 MG capsule Take 1 capsule (10 mg total) by mouth every 12 (twelve) hours as needed. 09/25/23   Dolores Frame, MD  gemfibrozil (LOPID) 600 MG tablet Take 600 mg by mouth 2 (two) times daily. 05/25/19   [provider]  Insulin Glargine (BASAGLAR KWIKPEN) 100 UNIT/ML  Inject 15 Units into the skin daily.    [provider]  Insulin Pen Needle (ABOUTTIME PEN NEEDLE) 31G X 8 MM MISC Use as needed to inject insulin daily as instructed 11/24/18   Vassie Loll, MD  JARDIANCE 10 MG TABS tablet Take 10 mg by mouth daily. 03/08/20   [provider]  lidocaine 4 % Place 1 patch onto the skin 2 (two) times daily. 10/06/23   Derwood Kaplan, MD  lisinopril (PRINIVIL,ZESTRIL) 40 MG tablet Take 1 tablet (40 mg total) by mouth daily. 06/26/14   Bethann Berkshire, MD  metFORMIN (GLUCOPHAGE) 500 MG tablet Take 1,000 mg by mouth 2 (two) times daily. 05/18/19   [provider]  methocarbamol (ROBAXIN) 500 MG tablet Take 1 tablet (500 mg total) by mouth 2 (two) times daily. 10/06/23   Derwood Kaplan, MD  metoprolol succinate (TOPROL-XL) 100 MG 24 hr tablet Take 100 mg by mouth daily. 05/18/20   [provider]  naproxen (NAPROSYN) 500 MG tablet Take 1 tablet (500 mg total) by mouth 2 (two) times daily. 10/06/23   Derwood Kaplan, MD  naproxen (NAPROSYN) 500 MG tablet Take 1 tablet (500 mg total) by mouth 2 (two) times daily. 10/09/23   Durwin Glaze, MD  omeprazole (PRILOSEC) 40 MG capsule Take 1 capsule (40 mg total) by mouth daily. 02/28/21   Carlan, Chelsea L, NP  ondansetron (ZOFRAN-ODT) 4 MG disintegrating tablet Take 1 tablet (4 mg total)  by mouth every 8 (eight) hours as needed for nausea or vomiting. 07/02/23   Dolores Frame, MD  Oxycodone HCl 10 MG TABS Take 10 mg by mouth 2 (two) times daily. 09/18/23   [provider]  potassium chloride SA (KLOR-CON) 20 MEQ tablet Take 20 mEq by mouth daily.    [provider]  sucralfate (CARAFATE) 1 g tablet Take 1 tablet (1 g total) by mouth 4 (four) times daily -  with meals and at bedtime. Dissolve tablet in 30ml of water, mix and drink slurry 10/01/23   Carlan, Chelsea L, NP      Allergies    Methylphenidate derivatives    Review of Systems   Review of Systems   Gastrointestinal:  Positive for nausea.  Endocrine: Positive for polyuria.  Neurological:  Positive for dizziness.    Physical Exam Updated Vital Signs BP (!) 170/103   Pulse 86   Temp 98.2 F (36.8 C) (Oral)   Resp 16   Ht 5\' 10"  (1.778 m)   Wt 127.5 kg   SpO2 96%   BMI 40.32 kg/m  Physical Exam Vitals and nursing note reviewed.  Constitutional:      General: He is not in acute distress.    Appearance: Normal appearance. He is well-developed. He is not ill-appearing, toxic-appearing or diaphoretic.  HENT:     Head: Normocephalic and atraumatic.     Right Ear: External ear normal. No drainage. No middle ear effusion. Tympanic membrane is not erythematous or bulging.     Left Ear: External ear normal. No drainage. A middle ear effusion is present. Tympanic membrane is not erythematous or bulging.  Eyes:     Conjunctiva/sclera: Conjunctivae normal.  Cardiovascular:     Rate and Rhythm: Normal rate and regular rhythm.     Heart sounds: No murmur heard. Pulmonary:     Effort: Pulmonary effort is normal. No respiratory distress.     Breath sounds: Normal breath sounds.  Abdominal:     Palpations: Abdomen is soft.     Tenderness: There is no abdominal tenderness.  Musculoskeletal:        General: No swelling.     Cervical back: Neck supple.  Skin:    General: Skin is warm and dry.  Neurological:     General: No focal deficit present.     Mental Status: He is alert and oriented to person, place, and time.     Cranial Nerves: No cranial nerve deficit.     Sensory: No sensory deficit.     Motor: No weakness.     Coordination: Coordination normal.  Psychiatric:        Mood and Affect: Mood normal.        Behavior: Behavior normal.     ED Results / Procedures / Treatments   Labs (all labs ordered are listed, but only abnormal results are displayed) Labs Reviewed  COMPREHENSIVE METABOLIC PANEL WITH GFR - Abnormal; Notable for the following components:      Result  Value   Glucose, Bld 176 (*)    Total Bilirubin 2.4 (*)    All other components within normal limits  URINALYSIS, ROUTINE W REFLEX MICROSCOPIC - Abnormal; Notable for the following components:   Ketones, ur 20 (*)    All other components within normal limits  CBG MONITORING, ED - Abnormal; Notable for the following components:   Glucose-Capillary 163 (*)    All other components within normal limits  CBC WITH DIFFERENTIAL/PLATELET  MAGNESIUM  EKG None  Radiology CT Head Wo Contrast Result Date: 10/24/2023 CLINICAL DATA:  Worsening dizziness over the past several days with difficulty finding words. EXAM: CT HEAD WITHOUT CONTRAST TECHNIQUE: Contiguous axial images were obtained from the base of the skull through the vertex without intravenous contrast. RADIATION DOSE REDUCTION: This exam was performed according to the departmental dose-optimization program which includes automated exposure control, adjustment of the mA and/or kV according to patient size and/or use of iterative reconstruction technique. COMPARISON:  None Available. FINDINGS: Brain: No evidence of acute infarction, hemorrhage, hydrocephalus, extra-axial collection or mass lesion/mass effect. Vascular: No hyperdense vessel or unexpected calcification. Skull: Normal. Negative for fracture or focal lesion. Sinuses/Orbits: No acute finding. Other: None. IMPRESSION: 1. No acute intracranial abnormality. Electronically Signed   By: Obie Dredge M.D.   On: 10/24/2023 11:57    Procedures Procedures    Medications Ordered in ED Medications  amLODipine (NORVASC) tablet 10 mg (10 mg Oral Given 10/24/23 1146)  metoprolol succinate (TOPROL-XL) 24 hr tablet 100 mg (100 mg Oral Given 10/24/23 1145)  lisinopril (ZESTRIL) tablet 40 mg (40 mg Oral Given 10/24/23 1201)  meclizine (ANTIVERT) tablet 25 mg (25 mg Oral Given 10/24/23 1146)  lactated ringers bolus 2,000 mL (2,000 mLs Intravenous New Bag/Given 10/24/23 1135)    ED Course/  Medical Decision Making/ A&P                                 Medical Decision Making Amount and/or Complexity of Data Reviewed Labs: ordered. Radiology: ordered.  Risk Prescription drug management.   This patient presents to the ED for concern of dizziness, this involves an extensive number of treatment options, and is a complaint that carries with it a high risk of complications and morbidity.  The differential diagnosis includes BPPV, vestibular neuritis, Mnire's disease, CVA, dehydration, anemia, migraine, metabolic derangements   Co morbidities that complicate the patient evaluation  DM, HTN, sleep apnea, GERD, polycythemia, anxiety, depression, bipolar disorder, ADHD   Additional history obtained:  Additional history obtained from N/A External records from outside source obtained and reviewed including EMR   Lab Tests:  I Ordered, and personally interpreted labs.  The pertinent results include: Hemoglobin, no leukocytosis, no significant hyperglycemia, normal electrolytes, normal kidney function.  Bilirubin mildly elevated but consistent with prior lab work from 2 weeks ago.   Imaging Studies ordered:  I ordered imaging studies including CT head I independently visualized and interpreted imaging which showed no acute findings I agree with the radiologist interpretation   Cardiac Monitoring: / EKG:  The patient was maintained on a cardiac monitor.  I personally viewed and interpreted the cardiac monitored which showed an underlying rhythm of: Sinus rhythm  Problem List / ED Course / Critical interventions / Medication management  Patient presents for multiple complaints.  This is in the setting of poor medication adherence.  He describes persistently elevated blood sugars at home with associated polyuria.  For the past 4 days, he has felt dizzy.  Dizziness has been intermittent and worsened with positional changes, consistent with peripheral cause.  On arrival in  the ED, he is overall well-appearing.  His blood pressure is elevated, likely secondary to not taking his medications today.  Home blood pressure medications were ordered.  On exam, patient does not have any focal neurologic deficits.  His dizziness is clearly positional.  He does have a left middle ear effusion without any erythematous  change.  Meclizine was ordered.  IV fluids were ordered for hydration.  Laboratory workup was initiated.  Lab results were unremarkable.  CT of head showed no acute findings.  On reassessment, patient's dizziness has improved.  Blood pressure has improved.  He was prescribed as needed meclizine to take at home.  He was advised to follow-up with PCP.  He was discharged in stable condition. I ordered medication including IV fluids for hydration; meclizine for dizziness Reevaluation of the patient after these medicines showed that the patient improved I have reviewed the patients home medicines and have made adjustments as needed   Social Determinants of Health:  Has PCP        Final Clinical Impression(s) / ED Diagnoses Final diagnoses:  Peripheral positional vertigo, unspecified laterality    Rx / DC Orders ED Discharge Orders          Ordered    meclizine (ANTIVERT) 25 MG tablet  3 times daily PRN        10/24/23 1318              Gloris Manchester, MD 10/24/23 1318

## 2023-10-26 NOTE — Progress Notes (Deleted)
 Name: Kyle Collins DOB: 05-29-1986 MRN: [id number]  History of Present Illness: Mr. Kyle Collins is a 38 y.o. male who presents today for follow up visit at [facility]. ***He is accompanied by ***. GU History includes: 1. Kidney stones.  Recent history: > 09/09/2023:  - Seen in ER for right flank pain. - CT showed a 2 x 3 mm right ureter stone with mild right hydronephrosis. Additional small stones (<4 mm) in both kidneys.  > 09/29/2023:  - Urology visit.  - Reported right flank pain and RUQ abdominal pain. No LUTS. Afebrile. Reports constipation due to his opioid pain medication.  - Advised the following: CBC & CMP. KUB ("5 mm stone projects over the left kidney. Known right renal stones not well demonstrated on current exam"). Flomax (Tamsulosin) 0.4 mg daily x2 weeks. Analgesics PRN for pain. Zofran PRN for nausea. Return in about 2 weeks (around 10/13/2023) for KUB, UA, & f/u with Evette Georges NP. Referral placed to General Surgery for hiatal hernia. Follow up with Pain Specialist.  > 10/01/2023: Advised follow up with Urology in 6 months for stone surveillance (instead of 2 weeks) based on apparent passage of the right ureteral stone based on KUB.   Today: RUS ***today: Awaiting radiology read; *** appreciated per provider interpretation.  He {Actions; denies-reports:120008} recent stone passage. He {Actions; denies-reports:120008} flank pain or abdominal pain. He {Actions; denies-reports:120008} fevers, nausea, or vomiting.  He {Actions; denies-reports:120008} increased urinary urgency, frequency, nocturia, dysuria, gross hematuria, hesitancy, straining to void, or sensations of incomplete emptying.   Medications: Current Outpatient Medications  Medication Sig Dispense Refill   amLODipine (NORVASC) 10 MG tablet Take 1 tablet (10 mg total) by mouth daily. 30 tablet 1   atorvastatin (LIPITOR) 10 MG tablet Take 10 mg by mouth daily.     chlorthalidone  (HYGROTON) 50 MG tablet Take 50 mg by mouth daily.     dicyclomine (BENTYL) 10 MG capsule Take 1 capsule (10 mg total) by mouth every 12 (twelve) hours as needed. 180 capsule 3   gemfibrozil (LOPID) 600 MG tablet Take 600 mg by mouth 2 (two) times daily.     Insulin Glargine (BASAGLAR KWIKPEN) 100 UNIT/ML Inject 15 Units into the skin daily.     Insulin Pen Needle (ABOUTTIME PEN NEEDLE) 31G X 8 MM MISC Use as needed to inject insulin daily as instructed 150 each 1   JARDIANCE 10 MG TABS tablet Take 10 mg by mouth daily.     lidocaine 4 % Place 1 patch onto the skin 2 (two) times daily. 12 patch 0   lisinopril (PRINIVIL,ZESTRIL) 40 MG tablet Take 1 tablet (40 mg total) by mouth daily. 30 tablet 1   meclizine (ANTIVERT) 25 MG tablet Take 1 tablet (25 mg total) by mouth 3 (three) times daily as needed for dizziness. 30 tablet 0   metFORMIN (GLUCOPHAGE) 500 MG tablet Take 1,000 mg by mouth 2 (two) times daily.     methocarbamol (ROBAXIN) 500 MG tablet Take 1 tablet (500 mg total) by mouth 2 (two) times daily. 20 tablet 0   metoprolol succinate (TOPROL-XL) 100 MG 24 hr tablet Take 100 mg by mouth daily.     naproxen (NAPROSYN) 500 MG tablet Take 1 tablet (500 mg total) by mouth 2 (two) times daily. 20 tablet 0   naproxen (NAPROSYN) 500 MG tablet Take 1 tablet (500 mg total) by mouth 2 (two) times daily. 30 tablet 0   omeprazole (PRILOSEC) 40 MG capsule Take 1  capsule (40 mg total) by mouth daily. 90 capsule 3   ondansetron (ZOFRAN-ODT) 4 MG disintegrating tablet Take 1 tablet (4 mg total) by mouth every 8 (eight) hours as needed for nausea or vomiting. 90 tablet 3   Oxycodone HCl 10 MG TABS Take 10 mg by mouth 2 (two) times daily.     potassium chloride SA (KLOR-CON) 20 MEQ tablet Take 20 mEq by mouth daily.     sucralfate (CARAFATE) 1 g tablet Take 1 tablet (1 g total) by mouth 4 (four) times daily -  with meals and at bedtime. Dissolve tablet in 30ml of water, mix and drink slurry 120 tablet 1   No  current facility-administered medications for this visit.    Allergies: Allergies  Allergen Reactions   Methylphenidate Derivatives Other (See Comments)    Hallucinations     Past Medical History:  Diagnosis Date   ADHD (attention deficit hyperactivity disorder)    Anxiety    Bipolar disorder (HCC)    Depression    Diabetes mellitus    GERD (gastroesophageal reflux disease)    Headache    Hemochromatosis 2019   Hypertension    Polycythemia    Sleep apnea    Thyroid disease    Past Surgical History:  Procedure Laterality Date   BIOPSY  12/14/2020   Procedure: BIOPSY;  Surgeon: Dolores Frame, MD;  [facility];  Service: Gastroenterology;;   BIOPSY  08/18/2023   Procedure: BIOPSY;  Surgeon: Dolores Frame, MD;  [facility];  Service: Gastroenterology;;   CHOLECYSTECTOMY N/A 11/24/2018   Procedure: LAPAROSCOPIC CHOLECYSTECTOMY;  Surgeon: Franky Macho, MD;  Location: [facility];  Service: General;  Laterality: N/A;   CIRCUMCISION N/A 09/14/2017   Procedure: CIRCUMCISION;  Surgeon: Malen Gauze, MD;  Location: [facility];  Service: Urology;  Laterality: N/A;  1 HR 336 [phone] - He has a Child psychotherapist that needs to be called if changes made Vikki Ports @ [phone] MEDICARE-[id number] MEDICAID-[id number] N   ESOPHAGOGASTRODUODENOSCOPY (EGD) WITH PROPOFOL N/A 12/14/2020   castaneda:Normal esophagus. z line irregular, normal stomach, duodenal erosions without bleeding, neg h pylori, biopsies unremarkable.   ESOPHAGOGASTRODUODENOSCOPY (EGD) WITH PROPOFOL N/A 08/18/2023   Procedure: ESOPHAGOGASTRODUODENOSCOPY (EGD) WITH PROPOFOL;  Surgeon: Dolores Frame, MD;  [facility]: [facility];  Service: Gastroenterology;  Laterality: N/A;  2:30pm;asa 3   HERNIA REPAIR     scrotum   MAXILLARY ANTROSTOMY Right 08/18/2018   Procedure: ENDOSCOPIC MAXILLARY RIGHT ANTROSTOMY WITH TISSUE REMOVAL;  Surgeon: Newman Pies, MD;  Location: [facility];  Service:  ENT;  Laterality: Right;   NASAL SEPTOPLASTY W/ TURBINOPLASTY Bilateral 08/18/2018   Procedure: NASAL SEPTOPLASTY WITH TURBINATE REDUCTION;  Surgeon: Newman Pies, MD;  Location: [facility];  Service: ENT;  Laterality: Bilateral;   NASAL SEPTUM SURGERY  08/18/2018   ORIF SHOULDER FRACTURE Right 02/28/2016   Procedure: OPEN REDUCTION INTERNAL FIXATION (ORIF) SHOULDER FRACTURE;  Surgeon: Vickki Hearing, MD;  Location: [facility];  Service: Orthopedics;  Laterality: Right;   SHOULDER CLOSED REDUCTION Right 12/04/2015   Procedure: CLOSED REDUCTION SHOULDER;  Surgeon: Vickki Hearing, MD;  Location: [facility];  Service: Orthopedics;  Laterality: Right;   TOOTH EXTRACTION  spring 2016   top left    Family History  Problem Relation Age of Onset   Diabetes Mother    Diabetes Father    Social History   Socioeconomic History   Marital status: Single    Spouse name: Not on file   Number  of children: Not on file   Years of education: Not on file   Highest education level: Not on file  Occupational History   Not on file  Tobacco Use   Smoking status: Former    Current packs/day: 0.00    Average packs/day: 0.5 packs/day for 5.0 years (2.5 ttl pk-yrs)    Types: E-cigarettes, Cigarettes    Start date: 02/25/2004    Quit date: 02/24/2009    Years since quitting: 14.6   Smokeless tobacco: Never  Vaping Use   Vaping status: Former   Quit date: 12/13/2018  Substance and Sexual Activity   Alcohol use: Not Currently    Comment: once year   Drug use: No    Comment: Hemp and cbd   Sexual activity: Never  Other Topics Concern   Not on file  Social History Narrative   Not on file   Social Drivers of Health   Financial Resource Strain: Low Risk  (04/18/2022)   Received [facility], [facility]   Overall Financial Resource Strain (CARDIA)    Difficulty of Paying Living Expenses: Not hard at all  Food Insecurity: No Food Insecurity (04/18/2022)   Received [facility], [facility]   Hunger Vital Sign    Worried About Running Out of Food in the Last Year: Never true    Ran Out of Food in the Last Year: Never true  Transportation Needs: No Transportation Needs (04/18/2022)   [facility], [facility]   [facility] - Transportation    Lack of Transportation (Medical): No    Lack of Transportation (Non-Medical): No  Physical Activity: Inactive (04/18/2022)   Received from [facility], [facility]   Exercise Vital Sign    Days of Exercise per Week: 0 days    Minutes of Exercise per Session: 0 min  Stress: No Stress Concern Present (04/18/2022)   Received from [facility], [facility] - Occupational Stress Questionnaire    Feeling of Stress : Not at all  Social Connections: Socially Isolated (04/18/2022)   Received from [facility], [facility]   Social Connection and Isolation Panel [NHANES]    Frequency of Communication with Friends and Family: Three times a week    Frequency of Social Gatherings with Friends and Family: Once a week    Attends Religious Services: Never    Database administrator or Organizations: No    Attends Banker Meetings: Never    Marital Status: Never married  Intimate Partner Violence: Not At Risk (04/18/2022)   Received from [facility], [facility]   Humiliation, Afraid, Rape, and Kick questionnaire    Fear of Current or Ex-Partner: No    Emotionally Abused: No    Physically Abused: No    Sexually Abused: No    SUBJECTIVE  Review of Systems Constitutional: Patient denies any unintentional weight loss or change in strength lntegumentary: Patient denies any rashes or pruritus Cardiovascular: Patient denies chest pain or syncope Respiratory: Patient denies shortness of breath Gastrointestinal: ***Patient {Actions; denies-reports:120008} ***nausea, ***vomiting, ***constipation, ***diarrhea ***As per HPI Musculoskeletal:  Patient denies muscle cramps or weakness Neurologic: Patient denies convulsions or seizures Allergic/Immunologic: Patient denies recent allergic reaction(s) Hematologic/Lymphatic: Patient denies bleeding tendencies Endocrine: Patient denies heat/cold intolerance  GU: As per HPI.  OBJECTIVE There were no vitals filed for this visit. There is no height or  weight on file to calculate BMI.  Physical Examination Constitutional: No obvious distress; patient is non-toxic appearing  Cardiovascular: No visible lower extremity edema.  Respiratory: The patient does not have audible wheezing/stridor; respirations do not appear labored  Gastrointestinal: Abdomen non-distended Musculoskeletal: Normal ROM of UEs  Skin: No obvious rashes/open sores  Neurologic: CN 2-12 grossly intact Psychiatric: Answered questions appropriately with normal affect  Hematologic/Lymphatic/Immunologic: No obvious bruises or sites of spontaneous bleeding  UA: ***negative ***positive for *** leukocytes, *** blood, ***nitrites Urine microscopy: *** WBC/hpf, *** RBC/hpf, *** bacteria ***otherwise unremarkable ***glucosuria (secondary to ***Jardiance ***Farxiga use)  PVR: *** ml  ASSESSMENT No diagnosis found.  ***We reviewed recent imaging results; ***awaiting radiology results, appears to have ***no acute findings per provider interpretation.  ***For stone prevention: Advised adequate hydration and we discussed option to consider low oxalate diet given that calcium oxalate is the most common type of stone. Handout provided about stone prevention diet.  ***For recurrent stone formers: We discussed option to proceed with 24 hour urinalysis ([facility]) for metabolic stone evaluation, which may help with targeted recommendations for dietary I medication therapies for stone prevention. Patient elected to ***proceed/ ***hold off.  Will plan to follow up in ***6 months / ***1 year with ***KUB ***RUS for stone surveillance  or sooner if needed.  Patient verbalized understanding of and agreement with current plan. All questions were answered.  PLAN Advised the following: Maintain adequate fluid intake daily. Drink citrus juice (lemon, lime or orange juice) routinely. Low oxalate diet. No follow-ups on file.  No orders of the defined types were placed in this encounter.   It has been explained that the patient is to follow regularly with their PCP in addition to all other providers involved in their care and to follow instructions provided by these respective offices. Patient advised to contact urology clinic if any urologic-pertaining questions, concerns, new symptoms or problems arise in the interim period.  There are no Patient Instructions on file for this visit.  Electronically signed by:  Donnita Falls, MSN, FNP-C, CUNP 10/26/2023 2:40 PM

## 2023-10-27 ENCOUNTER — Telehealth: Payer: Self-pay

## 2023-10-27 NOTE — Telephone Encounter (Signed)
 Called patient to make him aware and  patient state's he do not have the time or transportation to get a Renal Ultra Sound prior to appointment. Patient is made aware the provider will be informed of possibly of not be able to do the Renal ultrasound. Patient voiced understanding

## 2023-10-27 NOTE — Telephone Encounter (Signed)
-----   Message from Kyle Collins sent at 10/26/2023  2:49 PM EDT ----- He is scheduled on Wednesday 4/2. If he is still possibly symptomatic for right ureteral stone would advise renal US prior to that visit. Please find out. Thanks!

## 2023-10-28 ENCOUNTER — Ambulatory Visit: Admitting: Urology

## 2023-10-28 DIAGNOSIS — N200 Calculus of kidney: Secondary | ICD-10-CM

## 2023-11-08 ENCOUNTER — Other Ambulatory Visit: Payer: Self-pay

## 2023-11-08 ENCOUNTER — Encounter (HOSPITAL_COMMUNITY): Payer: Self-pay | Admitting: Emergency Medicine

## 2023-11-08 ENCOUNTER — Emergency Department (HOSPITAL_COMMUNITY)

## 2023-11-08 ENCOUNTER — Emergency Department (HOSPITAL_COMMUNITY)
Admission: EM | Admit: 2023-11-08 | Discharge: 2023-11-08 | Disposition: A | Discharge status: Home / Self Care | Attending: Emergency Medicine | Admitting: Emergency Medicine

## 2023-11-08 DIAGNOSIS — E878 Other disorders of electrolyte and fluid balance, not elsewhere classified: Secondary | ICD-10-CM | POA: Diagnosis not present

## 2023-11-08 DIAGNOSIS — K5900 Constipation, unspecified: Secondary | ICD-10-CM | POA: Insufficient documentation

## 2023-11-08 DIAGNOSIS — E1165 Type 2 diabetes mellitus with hyperglycemia: Secondary | ICD-10-CM | POA: Diagnosis not present

## 2023-11-08 DIAGNOSIS — Z794 Long term (current) use of insulin: Secondary | ICD-10-CM | POA: Insufficient documentation

## 2023-11-08 DIAGNOSIS — R109 Unspecified abdominal pain: Secondary | ICD-10-CM | POA: Diagnosis present

## 2023-11-08 LAB — URINALYSIS, ROUTINE W REFLEX MICROSCOPIC
Bilirubin Urine: NEGATIVE
Glucose, UA: NEGATIVE mg/dL
Hgb urine dipstick: NEGATIVE
Ketones, ur: NEGATIVE mg/dL
Leukocytes,Ua: NEGATIVE
Nitrite: NEGATIVE
Protein, ur: NEGATIVE mg/dL
Specific Gravity, Urine: 1.002 — ABNORMAL LOW (ref 1.005–1.030)
pH: 7 (ref 5.0–8.0)

## 2023-11-08 LAB — COMPREHENSIVE METABOLIC PANEL WITH GFR
ALT: 21 U/L (ref 0–44)
AST: 18 U/L (ref 15–41)
Albumin: 4.2 g/dL (ref 3.5–5.0)
Alkaline Phosphatase: 63 U/L (ref 38–126)
Anion gap: 12 (ref 5–15)
BUN: 17 mg/dL (ref 6–20)
CO2: 28 mmol/L (ref 22–32)
Calcium: 9.6 mg/dL (ref 8.9–10.3)
Chloride: 96 mmol/L — ABNORMAL LOW (ref 98–111)
Creatinine, Ser: 0.94 mg/dL (ref 0.61–1.24)
GFR, Estimated: >60 mL/min (ref >60)
Glucose, Bld: 241 mg/dL — ABNORMAL HIGH (ref 70–99)
Potassium: 4 mmol/L (ref 3.5–5.1)
Sodium: 136 mmol/L (ref 135–145)
Total Bilirubin: 2.4 mg/dL — ABNORMAL HIGH (ref 0.0–1.2)
Total Protein: 7.2 g/dL (ref 6.5–8.1)

## 2023-11-08 LAB — CBC
HCT: 46 % (ref 39.0–52.0)
Hemoglobin: 15.9 g/dL (ref 13.0–17.0)
MCH: 30 pg (ref 26.0–34.0)
MCHC: 34.6 g/dL (ref 30.0–36.0)
MCV: 86.8 fL (ref 80.0–100.0)
Platelets: 201 10*3/uL (ref 150–400)
RBC: 5.3 MIL/uL (ref 4.22–5.81)
RDW: 12.4 % (ref 11.5–15.5)
WBC: 7 10*3/uL (ref 4.0–10.5)
nRBC: 0 % (ref 0.0–0.2)

## 2023-11-08 LAB — LIPASE, BLOOD: Lipase: 32 U/L (ref 11–51)

## 2023-11-08 MED ORDER — POLYETHYLENE GLYCOL 3350 17 G PO PACK: 17.0000 g | PACK | Freq: Every day | ORAL | 0 refills | Status: AC

## 2023-11-08 MED ORDER — FLEET ENEMA RE ENEM: 1.0000 | ENEMA | Freq: Once | RECTAL | 0 refills | Status: AC

## 2023-11-08 MED ORDER — LACTULOSE 10 GM/15ML PO SOLN
20.0000 g | Freq: Once | ORAL | Status: AC
  Administered 2023-11-08: 20 g via ORAL
  Filled 2023-11-08: qty 30

## 2023-11-08 MED ORDER — SODIUM CHLORIDE 0.9 % IV BOLUS
1000.0000 mL | Freq: Once | INTRAVENOUS | Status: AC
  Administered 2023-11-08: 1000 mL via INTRAVENOUS

## 2023-11-08 MED ORDER — HYOSCYAMINE SULFATE 0.125 MG PO TBDP
0.1250 mg | ORAL_TABLET | Freq: Once | ORAL | Status: AC
  Administered 2023-11-08: 0.125 mg via ORAL
  Filled 2023-11-08 (×2): qty 1

## 2023-11-08 MED ORDER — POLYETHYLENE GLYCOL 3350 17 G PO PACK
17.0000 g | PACK | Freq: Once | ORAL | Status: AC
  Administered 2023-11-08: 17 g via ORAL
  Filled 2023-11-08: qty 1

## 2023-11-08 MED ORDER — LACTULOSE 20 GM/30ML PO SOLN: 10.0000 g | Freq: Every day | ORAL | 0 refills | Status: AC

## 2023-11-08 MED ORDER — FLEET ENEMA RE ENEM
1.0000 | ENEMA | Freq: Once | RECTAL | Status: AC
  Administered 2023-11-08: 1 via RECTAL

## 2023-11-08 NOTE — Discharge Instructions (Addendum)
 Was a pleasure taking care of you today.  You are seen in the ER today for constipation and continued lower abdominal pain.  Your x-ray and exam did not suggest fecal impaction.  I have prescribed an enema and a laxative to help you have bowel movements.  Ultimately need to follow-up with GI and if you are not improving with the Linzess, daily MiraLAX and the medications prescribed you may need further testing such as a colonoscopy.  Come back to the ER if you have fever, chills, worsening pain, vomiting or any other worrisome changes.

## 2023-11-08 NOTE — ED Provider Notes (Signed)
 [place] [facility]MENT AT [facility] Provider Note   CSN: [id number] Arrival date & time: 11/08/23  0915     History  Chief Complaint  Patient presents with   Abdominal Pain    Kyle Collins is a 38 y.o. male.  He has past medical history of anxiety, diabetes, GERD, obesity, sleep apnea, thyroid disease chronic pain managed with oxycodone, he is here today because he is having right sided abdominal pain.  He states in addition to the pain he feels like he is more constipated.  Having "pebble stools" has not had a normal bowel movement in over a month, and has even had to manually disimpact himself at times.  This been a chronic problem.  Prior testing it showed a sliding hiatal hernia.  EGD showed chronic duodenitis, had recent CTA that showed a right ureteral stone which has since resolved.    He had a surgical consult 3 days ago at [facility], they did not think the hiatal hernia was causing his abdominal pain.  He is also been having constipation related to his chronic opiate use and is on Linzess, surgery consult recommended adding MiraLAX and Metamucil.  He states over the past several days he can take the Linzess and also has taken medium citrate with only a small amount of water and stool fragments he reports.  He also has reduced his oxycodone dose to once a day to see if this would help.  Patient i notes that since October of last year he has had a lot of trouble with decreased appetite and decreased p.o. intake which is led to dehydration.  He has been trying to drink Pedialyte and water she feels this helped some.  Denies vomiting, diarrhea, fevers or chills.  No chest pain or shortness of breath.  He has baseline urinary frequency that he attributes to his diabetes but no other urinary complaints.   Abdominal Pain      Home Medications Prior to Admission medications   Medication Sig Start Date End Date Taking? Authorizing Provider  Lactulose 20 GM/30ML SOLN  Take 15 mLs (10 g total) by mouth daily at 12 noon. 11/08/23  Yes Thy Gullikson A, PA-C  polyethylene glycol (MIRALAX) 17 g packet Take 17 g by mouth daily. 11/08/23  Yes Jessicah Croll A, PA-C  sodium phosphate (FLEET) ENEM Place 133 mLs (1 enema total) rectally once for 1 dose. 11/08/23 11/08/23 Yes Edona Schreffler A, PA-C  amLODipine (NORVASC) 10 MG tablet Take 1 tablet (10 mg total) by mouth daily. 11/25/18   Justina Oman, MD  atorvastatin (LIPITOR) 10 MG tablet Take 10 mg by mouth daily. 05/08/20   [provider]  chlorthalidone (HYGROTON) 50 MG tablet Take 50 mg by mouth daily. 05/18/19   [provider]  dicyclomine (BENTYL) 10 MG capsule Take 1 capsule (10 mg total) by mouth every 12 (twelve) hours as needed. 09/25/23   Urban Garden, MD  gemfibrozil (LOPID) 600 MG tablet Take 600 mg by mouth 2 (two) times daily. 05/25/19   [provider]  Insulin Glargine (BASAGLAR KWIKPEN) 100 UNIT/ML Inject 15 Units into the skin daily.    [provider]  Insulin Pen Needle (ABOUTTIME PEN NEEDLE) 31G X 8 MM MISC Use as needed to inject insulin daily as instructed 11/24/18   Justina Oman, MD  JARDIANCE 10 MG TABS tablet Take 10 mg by mouth daily. 03/08/20   [provider]  lidocaine 4 % Place 1 patch onto the  skin 2 (two) times daily. 10/06/23   Nanavati, Ankit, MD  lisinopril (PRINIVIL,ZESTRIL) 40 MG tablet Take 1 tablet (40 mg total) by mouth daily. 06/26/14   Zammit, Joseph, MD  meclizine (ANTIVERT) 25 MG tablet Take 1 tablet (25 mg total) by mouth 3 (three) times daily as needed for dizziness. 10/24/23   Iva Mariner, MD  metFORMIN (GLUCOPHAGE) 500 MG tablet Take 1,000 mg by mouth 2 (two) times daily. 05/18/19   [provider]  methocarbamol (ROBAXIN) 500 MG tablet Take 1 tablet (500 mg total) by mouth 2 (two) times daily. 10/06/23   Nanavati, Ankit, MD  metoprolol succinate (TOPROL-XL) 100 MG 24 hr tablet Take 100 mg by mouth daily.  05/18/20   [provider]  naproxen (NAPROSYN) 500 MG tablet Take 1 tablet (500 mg total) by mouth 2 (two) times daily. 10/06/23   Nanavati, Ankit, MD  naproxen (NAPROSYN) 500 MG tablet Take 1 tablet (500 mg total) by mouth 2 (two) times daily. 10/09/23   Carin Charleston, MD  omeprazole (PRILOSEC) 40 MG capsule Take 1 capsule (40 mg total) by mouth daily. 02/28/21   Carlan, Chelsea L, NP  ondansetron (ZOFRAN-ODT) 4 MG disintegrating tablet Take 1 tablet (4 mg total) by mouth every 8 (eight) hours as needed for nausea or vomiting. 07/02/23   Urban Garden, MD  Oxycodone HCl 10 MG TABS Take 10 mg by mouth 2 (two) times daily. 09/18/23   [provider]  potassium chloride SA (KLOR-CON) 20 MEQ tablet Take 20 mEq by mouth daily.    [provider]  sucralfate (CARAFATE) 1 g tablet Take 1 tablet (1 g total) by mouth 4 (four) times daily -  with meals and at bedtime. Dissolve tablet in 30ml of water, mix and drink slurry 10/01/23   Carlan, Chelsea L, NP      Allergies    Methylphenidate derivatives    Review of Systems   Review of Systems  Gastrointestinal:  Positive for abdominal pain.    Physical Exam Updated Vital Signs BP (!) 149/107 (BP Location: Right Arm)   Pulse 70   Temp 97.6 F (36.4 C) (Oral)   Resp 18   SpO2 98%  Physical Exam Vitals and nursing note reviewed.  Constitutional:      General: He is not in acute distress.    Appearance: He is well-developed.  HENT:     Head: Normocephalic and atraumatic.  Eyes:     Conjunctiva/sclera: Conjunctivae normal.  Cardiovascular:     Rate and Rhythm: Normal rate and regular rhythm.     Heart sounds: No murmur heard. Pulmonary:     Effort: Pulmonary effort is normal. No respiratory distress.     Breath sounds: Normal breath sounds.  Abdominal:     General: Abdomen is flat. Bowel sounds are normal. There is no distension. There are no signs of injury.     Palpations: Abdomen is soft. There is no mass.      Tenderness: There is no abdominal tenderness.  Genitourinary:    Rectum: Normal. No mass or tenderness.     Comments: Exam chaperoned by RN, no stool in rectal vault, no tenderness or masses, no hemorrhoids, no fissure Musculoskeletal:        General: No swelling.     Cervical back: Neck supple.  Skin:    General: Skin is warm and dry.     Capillary Refill: Capillary refill takes less than 2 seconds.  Neurological:     General:  No focal deficit present.     Mental Status: He is alert and oriented to person, place, and time.  Psychiatric:        Mood and Affect: Mood normal.     ED Results / Procedures / Treatments   Labs (all labs ordered are listed, but only abnormal results are displayed) Labs Reviewed  COMPREHENSIVE METABOLIC PANEL WITH GFR - Abnormal; Notable for the following components:      Result Value   Chloride 96 (*)    Glucose, Bld 241 (*)    Total Bilirubin 2.4 (*)    All other components within normal limits  URINALYSIS, ROUTINE W REFLEX MICROSCOPIC - Abnormal; Notable for the following components:   Color, Urine COLORLESS (*)    Specific Gravity, Urine 1.002 (*)    All other components within normal limits  LIPASE, BLOOD  CBC    EKG None  Radiology DG Abdomen 1 View Result Date: 11/08/2023 CLINICAL DATA:  Constipation.  Abdominal pain. EXAM: ABDOMEN - 1 VIEW COMPARISON:  Radiograph 09/29/2023, CT 09/09/2023 FINDINGS: The previous right ureteral stone is not seen. Left intrarenal calculus is unchanged. The known right intrarenal stones are obscured. Left pelvic phlebolith. Small to moderate colonic stool burden. No bowel dilatation or evidence of obstruction. Cholecystectomy clips in the right upper quadrant. IMPRESSION: 1. Small to moderate colonic stool burden. No bowel obstruction. 2. Unchanged left intrarenal calculus. The known right intrarenal stones are obscured. Electronically Signed   By: Chadwick Colonel M.D.   On: 11/08/2023 11:03     Procedures Procedures    Medications Ordered in ED Medications  lactulose (CHRONULAC) 10 GM/15ML solution 20 g (has no administration in time range)  hyoscyamine (ANASPAZ) disintergrating tablet 0.125 mg (has no administration in time range)  sodium chloride 0.9 % bolus 1,000 mL (0 mLs Intravenous Stopped 11/08/23 1259)  sodium phosphate (FLEET) enema 1 enema (1 enema Rectal Given 11/08/23 1334)  polyethylene glycol (MIRALAX / GLYCOLAX) packet 17 g (17 g Oral Given 11/08/23 1331)    ED Course/ Medical Decision Making/ A&P                                 Medical Decision Making This patient presents to the ED for concern of crhronic R sided abdominal pain, constipation for over 1 month, this involves an extensive number of treatment options, and is a complaint that carries with it a high risk of complications and morbidity.  The differential diagnosis includes constipation, colitis, diverticulitis, bowel obstruction, IBS, IBD, ileus, gastroparesis, ureterolithiasis, other   Co morbidities that complicate the patient evaluation :   Diabetes, hypertension, chronic duodenitis   Additional history obtained:  Additional history obtained from [facility] External records from outside source obtained and reviewed including prior labs, prior notes including surgery consult note from earlier this week, GI notes and recent endoscopy showing chronic duodenitis   Lab Tests:  I Ordered, and personally interpreted labs.  The pertinent results include: CMP significant for elevated blood sugar, at 241, normal CO2 and normal anion gap, no concern for DKA, chloride slightly decreased at 96, bilirubin 2.4 which is around baseline, otherwise normal, lipase normal at 32, CBC normal, UA shows no signs of UTI, no hematuria   Imaging Studies ordered:  I ordered imaging studies including x-ray abdomen which shows mild to moderate stool burden, no bowel obstruction I independently visualized and interpreted  imaging within scope of identifying emergent findings  I agree with the radiologist interpretation    Problem List / ED Course / Critical interventions / Medication management  Chronic right lower quadrant abdominal pain constipation-patient taking Linzess, has not started MiraLAX yet, also tried mag citrate but still having bowel movements, states has been having this problem for the past several months, worse in the past month.  X-rays shows possible moderate stool burden with no obvious impaction, no obstructive gas pattern, he has good bowel sounds, he is tolerating p.o. fluids and has a soft abdomen, he is constantly holding the right flank but when palpating does not have any significantly worsened tenderness. He had previously had a kidney stone that is no longer present on his x-ray and he has no blood in his stool so I do not suspect obstructive uropathy.  Labs are reassuring, he does have mild hyperglycemia but otherwise normal, initially tachycardic on arrival, resolved with IV fluids.  Suspect his chronic decreased p.o. intake and polyuria from his hyperglycemia, some mild dehydration.  Not in DKA.  He has had a fairly thorough outpatient workup but discussed with him even though workup today is reassuring he may need colonoscopy due to continued constipation despite interventions.  Patient was very concerned about not having bowel movement for discharge because he states he is worried his intestines are going to rupture.  We discussed there are no signs of obstruction or impending perforation at this time, he was given fluids and medicine without significant relief, will give lactulose but encouraged him to continue at home with lactulose, MiraLAX daily, plenty of water, continue with the high-fiber diet that he states he is eating.  He states that his GI follow-up got pushed to several months away, so I offered to put an dilatory referral in, and he expresses appreciation of this.  We discussed  strict return precautions.  We also had shared decision-making where we discussed risks and benefits of repeat CT scans.  I have reviewed the patients home medicines and have made adjustments as needed      Amount and/or Complexity of Data Reviewed Labs: ordered. Radiology: ordered.  Risk OTC drugs. Prescription drug management.           Final Clinical Impression(s) / ED Diagnoses Final diagnoses:  Constipation, unspecified constipation type    Rx / DC Orders ED Discharge Orders          Ordered    sodium phosphate (FLEET) ENEM   Once        11/08/23 1256    Lactulose 20 GM/30ML SOLN  Daily        11/08/23 1256    polyethylene glycol (MIRALAX) 17 g packet  Daily        11/08/23 1400    Ambulatory referral to Gastroenterology        11/08/23 [address] 11/08/23 1448    Mordecai Applebaum, MD 11/09/23 [phone]

## 2023-11-08 NOTE — ED Triage Notes (Signed)
 Pt reports abdominal pain. Pt reports recently started on opioids and reports he has been unable to have a BM x 2 weeks. Pt also reports RLQ abdominal pain.

## 2023-11-11 ENCOUNTER — Telehealth: Payer: Self-pay | Admitting: Urology

## 2023-11-11 ENCOUNTER — Other Ambulatory Visit: Payer: Self-pay

## 2023-11-11 ENCOUNTER — Telehealth: Payer: Self-pay | Admitting: Gastroenterology

## 2023-11-11 DIAGNOSIS — N201 Calculus of ureter: Secondary | ICD-10-CM

## 2023-11-11 NOTE — Telephone Encounter (Signed)
 Patient called he needs an order put in for a Renal Ultrasound , he spoke with [facility] 10/27/23 to have this order put in, there is no order in system.   Please call his social worker to set up ride.  He can't have the KUB done he is on radiation reduction.

## 2023-11-11 NOTE — Telephone Encounter (Signed)
 Patient left a message about his upcoming appt.  He said that we were trying to change it and that we worked with his Child psychotherapist to get the 21st and he doesn't want to delay the appt.  [phone]

## 2023-11-11 NOTE — Telephone Encounter (Signed)
 US  order in, will you schedule with social worker.  Her number is listed in patient chart.

## 2023-11-11 NOTE — Telephone Encounter (Signed)
 I spoke to [place] (Child psychotherapist) this morning. Patient has OV scheduled for Tuesday next week

## 2023-11-16 ENCOUNTER — Ambulatory Visit (INDEPENDENT_AMBULATORY_CARE_PROVIDER_SITE_OTHER): Admitting: Gastroenterology

## 2023-11-17 ENCOUNTER — Ambulatory Visit (INDEPENDENT_AMBULATORY_CARE_PROVIDER_SITE_OTHER): Admitting: Gastroenterology

## 2023-11-17 ENCOUNTER — Encounter (INDEPENDENT_AMBULATORY_CARE_PROVIDER_SITE_OTHER): Payer: Self-pay | Admitting: Gastroenterology

## 2023-11-17 VITALS — BP 145/99 | HR 98 | Temp 98.0°F | Ht 70.0 in | Wt 270.3 lb

## 2023-11-17 DIAGNOSIS — K5900 Constipation, unspecified: Secondary | ICD-10-CM

## 2023-11-17 DIAGNOSIS — K5903 Drug induced constipation: Secondary | ICD-10-CM

## 2023-11-17 DIAGNOSIS — R195 Other fecal abnormalities: Secondary | ICD-10-CM | POA: Insufficient documentation

## 2023-11-17 DIAGNOSIS — R1031 Right lower quadrant pain: Secondary | ICD-10-CM | POA: Insufficient documentation

## 2023-11-17 DIAGNOSIS — R634 Abnormal weight loss: Secondary | ICD-10-CM

## 2023-11-17 DIAGNOSIS — K2980 Duodenitis without bleeding: Secondary | ICD-10-CM

## 2023-11-17 DIAGNOSIS — K219 Gastro-esophageal reflux disease without esophagitis: Secondary | ICD-10-CM

## 2023-11-17 DIAGNOSIS — G8929 Other chronic pain: Secondary | ICD-10-CM

## 2023-11-17 MED ORDER — NALOXEGOL OXALATE 12.5 MG PO TABS: 12.5000 mg | ORAL_TABLET | Freq: Every day | ORAL | 1 refills | Status: AC

## 2023-11-17 NOTE — Progress Notes (Addendum)
 Referring Provider: Veda Gerald, MD Primary Care Physician:  Kyle Gerald, MD Primary GI Physician: Kyle Collins   Chief Complaint  Patient presents with   Abdominal Pain    Follow up on abdominal pain on right lower side and constipation. Went to ED on 4/13 and states he was told he needed colonoscopy. States last October he took a tetanus shot and lost his appetite. Concerned about weight loss. Has occasional nausea. Has been getting hot flashes.    HPI:   Kyle Collins is a 38 y.o. male with past medical history of  ADHD, anxiety, bipolar disorder, depression, diabetes c/b neuropathy, GERD, hypertension, sleep apnea, hyperlipidemia    Patient presenting today for:   abdominal pain, constipation, weight loss, change in stool caliber and GERD  Last seen march, at that time continue to have RLQ pain, seeing urology who stated he had no obvious renal stones on KUB that would be causing pain. Having some urinary symptoms though. Continued RUQ pain, constant. Concerned about hiatal hernia noted on CT, though EGD in January did not note hh. Having some constipation, weight loss.   Recommended to continue omeprazole  40mg  daily, continue zofran  PRN, follow up with urology. Discuss radiation of pain from spinal issues with PCP/spine specialist, carafate  1g QID>   Notably with multiple ED visits over the past few months for different c/o.   KUB done 4/13 with small to moderate colonic stool burden, no obstruction, left intrarenal calculus   CBC, CMP grossly unremarkable other than chronically elevated T bili, glucose 241, lipase WNL, UA neg  Present:  Having a lot of RLQ. Reports some impactions since his last visit. Had to go to the ED where he got lactulose  and an enema. Stools are now pencil thin, slow to pass. He is taking oxycodone  for pain. He is currently taking miralax  for his constipation, taking 1 capful daily, reports he has to strain a lot to defecate. Has not tried higher  dose of miralax . He had some improvement in lactulose  in the ED which helped but states the bottle he has at home was just a small dose which did not help. He denies rectal bleeding or melena. Noticed a "white streak" in stools this morning, unsure if this was mucus. He has some improvement in abdominal pain with laying back and taking oxycodone  BID. No improvement in his pain with having a BM.   Still has some upper abdominal pain and epigastric pain. States he was told he has muscle wasting in his back which is thought to be contributing to his upper abdominal pain though he has not seen a spinal specialist. He saw Dr. Zadie Collins who did not feel hh repair was warranted given the size.   Weight is down to 270 pounds, from 291 in March at last OV. Appetite is not great.   Taking omeprazole  40mg  daily, feels that this helps with his reflux. Nausea on occasion but not as bad as previously. Notes some early satiety at times as well.   Still seeing urology regarding renal stones, has US  of kidneys on 5/1   EGD 08/18/23 - Normal esophagus. - Normal stomach. Biopsied. - Normal examined duodenum. Chronic peptic duodenitis CT of the abdomen and pelvis with IV 05/2023 contrast showed status postcholecystectomy and small bilateral renal calculi without obstruction.  CT of the abdomen and pelvis with contrast on 04/02/2023 which was only abnormal for nonobstructive nephrolithiasis.  CT of the abdomen and pelvis without contrast on 04/22/2023 showed  presence of nonobstructive renal calculi and diverticulosis.  Notably, he has had multiple ultrasounds and CT of the abdomen and pelvis examinations at least since 2019 which have been unremarkable for any etiology of abdominal pain and nausea.  Filed Weights   11/17/23 1131  Weight: 270 lb 4.8 oz (122.6 kg)     Past Medical History:  Diagnosis Date   ADHD (attention deficit hyperactivity disorder)    Anxiety    Bipolar disorder (HCC)    Depression     Diabetes mellitus    GERD (gastroesophageal reflux disease)    Headache    Hemochromatosis 2019   Hypertension    Polycythemia    Sleep apnea    Thyroid  disease     Past Surgical History:  Procedure Laterality Date   BIOPSY  12/14/2020   Procedure: BIOPSY;  Surgeon: Kyle Garden, MD;  [facility];  Service: Gastroenterology;;   BIOPSY  08/18/2023   Procedure: BIOPSY;  Surgeon: Kyle Garden, MD;  [facility];  Service: Gastroenterology;;   CHOLECYSTECTOMY N/A 11/24/2018   Procedure: LAPAROSCOPIC CHOLECYSTECTOMY;  Surgeon: Kyle Allegra, MD;  Location: [facility];  Service: General;  Laterality: N/A;   CIRCUMCISION N/A 09/14/2017   Procedure: CIRCUMCISION;  Surgeon: Kyle Severs, MD;  Location: [facility];  Service: Urology;  Laterality: N/A;  1 HR 336 [phone] - He has a Child psychotherapist that needs to be called if changes made [place] @ [phone] MEDICARE-[id number] MEDICAID-[id number] N   ESOPHAGOGASTRODUODENOSCOPY (EGD) WITH PROPOFOL  N/A 12/14/2020   castaneda:Normal esophagus. z line irregular, normal stomach, duodenal erosions without bleeding, neg h pylori, biopsies unremarkable.   ESOPHAGOGASTRODUODENOSCOPY (EGD) WITH PROPOFOL  N/A 08/18/2023   Procedure: ESOPHAGOGASTRODUODENOSCOPY (EGD) WITH PROPOFOL ;  Surgeon: Kyle Garden, MD;  [facility];  Service: Gastroenterology;  Laterality: N/A;  2:30pm;asa 3   HERNIA REPAIR     scrotum   MAXILLARY ANTROSTOMY Right 08/18/2018   Procedure: ENDOSCOPIC MAXILLARY RIGHT ANTROSTOMY WITH TISSUE REMOVAL;  Surgeon: Kyle Caves, MD;  Location: [facility];  Service: ENT;  Laterality: Right;   NASAL SEPTOPLASTY W/ TURBINOPLASTY Bilateral 08/18/2018   Procedure: NASAL SEPTOPLASTY WITH TURBINATE REDUCTION;  Surgeon: Kyle Caves, MD;  Location: [facility];  Service: ENT;  Laterality: Bilateral;   NASAL SEPTUM SURGERY  08/18/2018   ORIF SHOULDER FRACTURE Right 02/28/2016   Procedure: OPEN REDUCTION  INTERNAL FIXATION (ORIF) SHOULDER FRACTURE;  Surgeon: Kyle Emerald, MD;  Location: [facility];  Service: Orthopedics;  Laterality: Right;   SHOULDER CLOSED REDUCTION Right 12/04/2015   Procedure: CLOSED REDUCTION SHOULDER;  Surgeon: Kyle Emerald, MD;  Location: [facility];  Service: Orthopedics;  Laterality: Right;   TOOTH EXTRACTION  spring 2016   top left     Current Outpatient Medications  Medication Sig Dispense Refill   amLODipine  (NORVASC ) 10 MG tablet Take 1 tablet (10 mg total) by mouth daily. 30 tablet 1   chlorthalidone (HYGROTON) 50 MG tablet Take 50 mg by mouth daily.     Insulin  Glargine (BASAGLAR  KWIKPEN) 100 UNIT/ML Inject 15 Units into the skin daily.     Insulin  Pen Needle (ABOUTTIME PEN NEEDLE) 31G X 8 MM MISC Use as needed to inject insulin  daily as instructed 150 each 1   ondansetron  (ZOFRAN -ODT) 4 MG disintegrating tablet Take 1 tablet (4 mg total) by mouth every 8 (eight) hours as needed for nausea or vomiting. 90 tablet 3   Oxycodone  HCl 10 MG TABS Take 10 mg by mouth 2 (two) times  daily.     polyethylene glycol (MIRALAX ) 17 g packet Take 17 g by mouth daily. 14 each 0   atorvastatin (LIPITOR) 10 MG tablet Take 10 mg by mouth daily. (Patient not taking: Reported on 11/17/2023)     dicyclomine  (BENTYL ) 10 MG capsule Take 1 capsule (10 mg total) by mouth every 12 (twelve) hours as needed. (Patient not taking: Reported on 11/17/2023) 180 capsule 3   gemfibrozil (LOPID) 600 MG tablet Take 600 mg by mouth 2 (two) times daily. (Patient not taking: Reported on 11/17/2023)     JARDIANCE  10 MG TABS tablet Take 10 mg by mouth daily. (Patient not taking: Reported on 11/17/2023)     Lactulose  20 GM/30ML SOLN Take 15 mLs (10 g total) by mouth daily at 12 noon. (Patient not taking: Reported on 11/17/2023) 60 mL 0   lidocaine  4 % Place 1 patch onto the skin 2 (two) times daily. (Patient not taking: Reported on 11/17/2023) 12 patch 0   lisinopril  (PRINIVIL ,ZESTRIL ) 40 MG tablet Take 1  tablet (40 mg total) by mouth daily. (Patient not taking: Reported on 11/17/2023) 30 tablet 1   meclizine  (ANTIVERT ) 25 MG tablet Take 1 tablet (25 mg total) by mouth 3 (three) times daily as needed for dizziness. (Patient not taking: Reported on 11/17/2023) 30 tablet 0   metFORMIN  (GLUCOPHAGE ) 500 MG tablet Take 1,000 mg by mouth 2 (two) times daily. (Patient not taking: Reported on 11/17/2023)     methocarbamol  (ROBAXIN ) 500 MG tablet Take 1 tablet (500 mg total) by mouth 2 (two) times daily. (Patient not taking: Reported on 11/17/2023) 20 tablet 0   metoprolol  succinate (TOPROL -XL) 100 MG 24 hr tablet Take 100 mg by mouth daily. (Patient not taking: Reported on 11/17/2023)     naproxen  (NAPROSYN ) 500 MG tablet Take 1 tablet (500 mg total) by mouth 2 (two) times daily. (Patient not taking: Reported on 11/17/2023) 20 tablet 0   naproxen  (NAPROSYN ) 500 MG tablet Take 1 tablet (500 mg total) by mouth 2 (two) times daily. (Patient not taking: Reported on 11/17/2023) 30 tablet 0   omeprazole  (PRILOSEC) 40 MG capsule Take 1 capsule (40 mg total) by mouth daily. (Patient not taking: Reported on 11/17/2023) 90 capsule 3   potassium chloride  SA (KLOR-CON ) 20 MEQ tablet Take 20 mEq by mouth daily. (Patient not taking: Reported on 11/17/2023)     sucralfate  (CARAFATE ) 1 g tablet Take 1 tablet (1 g total) by mouth 4 (four) times daily -  with meals and at bedtime. Dissolve tablet in 30ml of water , mix and drink slurry (Patient not taking: Reported on 11/17/2023) 120 tablet 1   No current facility-administered medications for this visit.    Allergies as of 11/17/2023 - Review Complete 11/17/2023  Allergen Reaction Noted   Methylphenidate derivatives Other (See Comments) 02/17/2011    Social History   Socioeconomic History   Marital status: Single    Spouse name: Not on file   Number of children: Not on file   Years of education: Not on file   Highest education level: Not on file  Occupational History   Not on  file  Tobacco Use   Smoking status: Former    Current packs/day: 0.00    Average packs/day: 0.5 packs/day for 5.0 years (2.5 ttl pk-yrs)    Types: E-cigarettes, Cigarettes    Start date: 02/25/2004    Quit date: 02/24/2009    Years since quitting: 14.7   Smokeless tobacco: Never  Vaping Use   Vaping  status: Former   Quit date: 12/13/2018  Substance and Sexual Activity   Alcohol use: Not Currently    Comment: once year   Drug use: No    Comment: Hemp and cbd   Sexual activity: Never  Other Topics Concern   Not on file  Social History Narrative   Not on file   Social Drivers of Health   Financial Resource Strain: Low Risk  (04/18/2022)   Received from [facility], [facility]   Overall Financial Resource Strain (CARDIA)    Difficulty of Paying Living Expenses: Not hard at all  Food Insecurity: No Food Insecurity (04/18/2022)   Received from [facility], [facility]   Hunger Vital Sign    Worried About Running Out of Food in the Last Year: Never true    Ran Out of Food in the Last Year: Never true  Transportation Needs: No Transportation Needs (04/18/2022)   Received from [facility], [facility]   [facility] - Transportation    Lack of Transportation (Medical): No    Lack of Transportation (Non-Medical): No  Physical Activity: Inactive (04/18/2022)   Received from [facility], [facility]   Exercise Vital Sign    Days of Exercise per Week: 0 days    Minutes of Exercise per Session: 0 min  Stress: No Stress Concern Present (04/18/2022)   Received from [facility], [facility] of Occupational Health - Occupational Stress Questionnaire    Feeling of Stress : Not at all  Social Connections: Socially Isolated (04/18/2022)   Received from [facility], [facility]   Social Connection and Isolation Panel [NHANES]    Frequency of Communication with Friends and Family: Three times a week    Frequency of Social Gatherings  with Friends and Family: Once a week    Attends Religious Services: Never    Database administrator or Organizations: No    Attends Engineer, structural: Never    Marital Status: Never married    Review of systems General: negative for malaise, night sweats, fever, chills, +weight loss  Neck: Negative for lumps, goiter, pain and significant neck swelling Resp: Negative for cough, wheezing, dyspnea at rest CV: Negative for chest pain, leg swelling, palpitations, orthopnea GI: denies melena, hematochezia, nausea, vomiting, diarrhea, dysphagia, odynophagia +constipation +change in stool caliber +RLQ pain +upper abdominal pain +early satiety +weight loss  MSK: Negative for joint pain or swelling +back pain +numbness to Left leg The remainder of the review of systems is noncontributory.  Physical Exam: BP (!) 145/99   Pulse 98   Temp 98 F (36.7 C) (Oral)   Ht 5\' 10"  (1.778 m)   Wt 270 lb 4.8 oz (122.6 kg)   BMI 38.78 kg/m  General:   Alert and oriented. No distress noted. Pleasant and cooperative.  Head:  Normocephalic and atraumatic. Eyes:  Conjuctiva clear without scleral icterus. Mouth:  Oral mucosa pink and moist. Good dentition. No lesions. Heart: Normal rate and rhythm, s1 and s2 heart sounds present.  Lungs: Clear lung sounds in all lobes. Respirations equal and unlabored. Abdomen:  +BS, soft,  and non-distended. TTP of diffuse abdomen, worse in RLQ. No rebound or guarding. No HSM or masses noted. Derm: No palmar erythema or jaundice Msk:  Symmetrical without gross deformities. Normal posture. Extremities:  Without edema. Neurologic:  Alert and  oriented x4 Psych:  Alert and cooperative. Normal mood and  affect.  Invalid input(s): "6 MONTHS"   ASSESSMENT: Kyle Collins is a 38 y.o. male presenting today for abdominal pain, constipation, weight loss, change in stool caliber, GERD  Upper abdominal pain/GERD/peptic duodenitis: recent EGD with peptic duodenitis,  started on omeprazole  40mg  daily which has helped his GERD and some improvement in his upper abdominal pain. Rare nausea. Provided carafate  at last visit without much improvement. We had previously discussed I suspect there may be some aspect of pain radiation from his spine, though he is still waiting on referral to spine specialist from his PCP/in process of acquiring a new PCP. I encourage him to continue PPI and see if PCP will be willing to refer him to spinal specialist for further evaluation.   RLQ pain/constipation/change in stool caliber/weight loss: having pencil thin stools, worsening constipation (in setting of opiates) and now with RLQ pain. No rectal bleeding or melena. He has lost a good bit of weight, down 21 pounds just since his last visit here. Currently doing miralax  1 capful a day without much relief. On oxycodone  BID. Will start movantik  12.5mg  daily. I would recommend proceeding with Colonoscopy in setting of RLQ pain, stool caliber change and weight loss. Indications, risks and benefits of procedure discussed in detail with patient. Patient verbalized understanding and is in agreement to proceed with Colonoscopy.   We again discussed possible TCA if no findings to suggest GI etiology of his abdominal pain as there may be some aspect of functional abdominal pain going on here as well. We discussed it will be important to rule out organic causes first and can consider TCA later if needed.   PLAN:  -movantik  12.5mg  daily  -schedule colonoscopy ASA III -continue omeprazole  40mg  daily  -Increase water  intake, aim for atleast 64 oz per day -Increase fruits, veggies and whole grains, kiwi and prunes are especially good for constipation -please discuss spinal specialist referral with your PCP -consider TCA in the future if pain persists and no findings to explain etiology  All questions were answered, patient verbalized understanding and is in agreement with plan as outlined above.     Follow Up: 3 months   Rorik Vespa L. Adrien Alberta, MSN, APRN, AGNP-C Adult-Gerontology Nurse Practitioner [facility]  I have reviewed the note and agree with the APP's assessment as described in this progress note  Samantha Cress, MD Gastroenterology and Hepatology [facility]

## 2023-11-17 NOTE — Patient Instructions (Signed)
-  start movantik  12.5mg  daily for constipation -we will schedule colonoscopy -continue omeprazole  40mg  daily for reflux -Increase water  intake, aim for atleast 64 oz per day -Increase fruits, veggies and whole grains, kiwi and prunes are especially good for constipation -please discuss spinal specialist referral with your PCP, as I think you may benefit from evaluation of your spine given there is likely a component of referred pain  Follow up 3 months  It was a pleasure to see you today. I want to create trusting relationships with patients and provide genuine, compassionate, and quality care. I truly value your feedback! please be on the lookout for a survey regarding your visit with me today. I appreciate your input about our visit and your time in completing this!    Bayard More L. Abelina Ketron, MSN, APRN, AGNP-C Adult-Gerontology Nurse Practitioner [facility] at [facility]

## 2023-11-19 MED ORDER — PEG 3350-KCL-NA BICARB-NACL 420 G PO SOLR: 4000.0000 mL | Freq: Once | ORAL | 0 refills | Status: AC

## 2023-11-19 NOTE — Addendum Note (Signed)
 Addended by: Laquenta Whitsell on: 11/19/2023 08:42 AM   Modules accepted: Orders

## 2023-11-23 ENCOUNTER — Encounter (INDEPENDENT_AMBULATORY_CARE_PROVIDER_SITE_OTHER): Payer: Self-pay | Admitting: *Deleted

## 2023-11-24 ENCOUNTER — Telehealth (INDEPENDENT_AMBULATORY_CARE_PROVIDER_SITE_OTHER): Payer: Self-pay | Admitting: Gastroenterology

## 2023-11-24 NOTE — Telephone Encounter (Signed)
 Pt left voicemail in regards to prep for TCS. Returned call to pt and explained that his instructions will be coming in the mail and that prep was sent in early since I will be taking a more clinical role as being Dr.Ahmed nurse beginning 12/14/23. Pt verbalized understanding.

## 2023-11-26 ENCOUNTER — Ambulatory Visit (HOSPITAL_COMMUNITY)
Admission: RE | Admit: 2023-11-26 | Discharge: 2023-11-26 | Disposition: A | Discharge status: Home / Self Care | Source: Ambulatory Visit | Attending: Urology | Admitting: Urology

## 2023-11-26 DIAGNOSIS — N201 Calculus of ureter: Secondary | ICD-10-CM | POA: Insufficient documentation

## 2023-11-30 ENCOUNTER — Other Ambulatory Visit: Payer: Self-pay | Admitting: Cardiology

## 2023-11-30 ENCOUNTER — Telehealth: Payer: Self-pay | Admitting: Cardiology

## 2023-11-30 ENCOUNTER — Ambulatory Visit: Attending: Cardiology

## 2023-11-30 ENCOUNTER — Encounter: Payer: Self-pay | Admitting: Cardiology

## 2023-11-30 ENCOUNTER — Ambulatory Visit: Attending: Cardiology | Admitting: Cardiology

## 2023-11-30 VITALS — BP 119/85 | HR 78 | Wt 261.0 lb

## 2023-11-30 DIAGNOSIS — R002 Palpitations: Secondary | ICD-10-CM | POA: Insufficient documentation

## 2023-11-30 DIAGNOSIS — R079 Chest pain, unspecified: Secondary | ICD-10-CM

## 2023-11-30 DIAGNOSIS — R0789 Other chest pain: Secondary | ICD-10-CM | POA: Diagnosis present

## 2023-11-30 NOTE — Patient Instructions (Signed)
 Medication Instructions:  Your physician recommends that you continue on your current medications as directed. Please refer to the Current Medication list given to you today.   Labwork: None  Testing/Procedures: .Your physician has requested that you have an echocardiogram. Echocardiography is a painless test that uses sound waves to create images of your heart. It provides your doctor with information about the size and shape of your heart and how well your heart's chambers and valves are working. This procedure takes approximately one hour. There are no restrictions for this procedure. Please do NOT wear cologne, perfume, aftershave, or lotions (deodorant is allowed). Please arrive 15 minutes prior to your appointment time.  Please note: We ask at that you not bring children with you during ultrasound (echo/ vascular) testing. Due to room size and safety concerns, children are not allowed in the ultrasound rooms during exams. Our front office staff cannot provide observation of children in our lobby area while testing is being conducted. An adult accompanying a patient to their appointment will only be allowed in the ultrasound room at the discretion of the ultrasound technician under special circumstances. We apologize for any inconvenience.  Your physician has recommended that you wear a Zio monitor.   This monitor is a medical device that records the heart's electrical activity. Doctors most often use these monitors to diagnose arrhythmias. Arrhythmias are problems with the speed or rhythm of the heartbeat. The monitor is a small device applied to your chest. You can wear one while you do your normal daily activities. While wearing this monitor if you have any symptoms to push the button and record what you felt. Once you have worn this monitor for the period of time provider prescribed (for 7 days), you will return the monitor device in the postage paid box. Once it is returned they will download  the data collected and provide us  with a report which the provider will then review and we will call you with those results. Important tips:  Avoid showering during the first 24 hours of wearing the monitor. Avoid excessive sweating to help maximize wear time. Do not submerge the device, no hot tubs, and no swimming pools. Keep any lotions or oils away from the patch. After 24 hours you may shower with the patch on. Take brief showers with your back facing the shower head.  Do not remove patch once it has been placed because that will interrupt data and decrease adhesive wear time. Push the button when you have any symptoms and write down what you were feeling. Once you have completed wearing your monitor, remove and place into box which has postage paid and place in your outgoing mailbox.  If for some reason you have misplaced your box then call our office and we can provide another box and/or mail it off for you.   Follow-Up: Your physician recommends that you schedule a follow-up appointment in: 6 weeks  Any Other Special Instructions Will Be Listed Below (If Applicable). Thank you for choosing [facility]!     If you need a refill on your cardiac medications before your next appointment, please call your pharmacy.

## 2023-11-30 NOTE — Telephone Encounter (Signed)
 Checking percert on the following patient   7 day Zio XT Dx: Palpitations

## 2023-11-30 NOTE — Progress Notes (Signed)
 Clinical Summary Mr. Niska is a 38 y.o.male seen today as a new patient for the following medical problems.   Previously evaluted by [facility] cardiology, last 05/2020   1.Chest pain  05/2020 echo: LVEF 55-60%,probably normal diastolic function.  05/2020 stress PET: no ischemia.    -09/2023 ER visit with chest pain, positional  - 09/2023 CTA: normal aorta Chest pain can be separate from palpitation - epgiastc/midchest radiating to back, can also be right. Sharp or aching pain, can be positional. Can last up to 20-40 minutes. Can occur at rest or with activity, but more frequent with exertion.  - pains in different areas, at times mid chest, left chest, right chest. Recent GI evaluatoin fo RUQ pain.    2. Palpitations - several year history - occurs daily. No specific trigger, though often with exertion or stress - can last several minutes. Can get some chest discomfort, lightheadedness/dizziness. Can feel presyncopal, but no true syncope.   - was getting out of shoulder, started to feel dizzy and lightheaded. Changes in vision/blurry vision, near syncope.  - orthostatic dizziness often -works to stay well hydrated. Drinks water /pedialyte up 1 gallon     Past Medical History:  Diagnosis Date   ADHD (attention deficit hyperactivity disorder)    Anxiety    Bipolar disorder (HCC)    Depression    Diabetes mellitus    GERD (gastroesophageal reflux disease)    Headache    Hemochromatosis 2019   Hypertension    Polycythemia    Sleep apnea    Thyroid  disease      Allergies  Allergen Reactions   Methylphenidate Derivatives Other (See Comments)    Hallucinations      Current Outpatient Medications  Medication Sig Dispense Refill   amLODipine  (NORVASC ) 10 MG tablet Take 1 tablet (10 mg total) by mouth daily. 30 tablet 1   atorvastatin (LIPITOR) 10 MG tablet Take 10 mg by mouth daily. (Patient not taking: Reported on 11/17/2023)     chlorthalidone (HYGROTON) 50 MG  tablet Take 50 mg by mouth daily.     dicyclomine  (BENTYL ) 10 MG capsule Take 1 capsule (10 mg total) by mouth every 12 (twelve) hours as needed. (Patient not taking: Reported on 11/17/2023) 180 capsule 3   gemfibrozil (LOPID) 600 MG tablet Take 600 mg by mouth 2 (two) times daily. (Patient not taking: Reported on 11/17/2023)     Insulin  Glargine (BASAGLAR  KWIKPEN) 100 UNIT/ML Inject 15 Units into the skin daily.     Insulin  Pen Needle (ABOUTTIME PEN NEEDLE) 31G X 8 MM MISC Use as needed to inject insulin  daily as instructed 150 each 1   JARDIANCE  10 MG TABS tablet Take 10 mg by mouth daily. (Patient not taking: Reported on 11/17/2023)     Lactulose  20 GM/30ML SOLN Take 15 mLs (10 g total) by mouth daily at 12 noon. (Patient not taking: Reported on 11/17/2023) 60 mL 0   lisinopril  (PRINIVIL ,ZESTRIL ) 40 MG tablet Take 1 tablet (40 mg total) by mouth daily. (Patient not taking: Reported on 11/17/2023) 30 tablet 1   meclizine  (ANTIVERT ) 25 MG tablet Take 1 tablet (25 mg total) by mouth 3 (three) times daily as needed for dizziness. (Patient not taking: Reported on 11/17/2023) 30 tablet 0   metFORMIN  (GLUCOPHAGE ) 500 MG tablet Take 1,000 mg by mouth 2 (two) times daily. (Patient not taking: Reported on 11/17/2023)     methocarbamol  (ROBAXIN ) 500 MG tablet Take 1 tablet (500 mg total) by mouth 2 (  two) times daily. (Patient not taking: Reported on 11/17/2023) 20 tablet 0   metoprolol  succinate (TOPROL -XL) 100 MG 24 hr tablet Take 100 mg by mouth daily. (Patient not taking: Reported on 11/17/2023)     naloxegol  oxalate (MOVANTIK ) 12.5 MG TABS tablet Take 1 tablet (12.5 mg total) by mouth daily. 30 tablet 1   naproxen  (NAPROSYN ) 500 MG tablet Take 1 tablet (500 mg total) by mouth 2 (two) times daily. (Patient not taking: Reported on 11/17/2023) 20 tablet 0   naproxen  (NAPROSYN ) 500 MG tablet Take 1 tablet (500 mg total) by mouth 2 (two) times daily. (Patient not taking: Reported on 11/17/2023) 30 tablet 0   omeprazole   (PRILOSEC) 40 MG capsule Take 1 capsule (40 mg total) by mouth daily. (Patient not taking: Reported on 11/17/2023) 90 capsule 3   ondansetron  (ZOFRAN -ODT) 4 MG disintegrating tablet Take 1 tablet (4 mg total) by mouth every 8 (eight) hours as needed for nausea or vomiting. 90 tablet 3   Oxycodone  HCl 10 MG TABS Take 10 mg by mouth 2 (two) times daily.     polyethylene glycol (MIRALAX ) 17 g packet Take 17 g by mouth daily. 14 each 0   potassium chloride  SA (KLOR-CON ) 20 MEQ tablet Take 20 mEq by mouth daily. (Patient not taking: Reported on 11/17/2023)     sucralfate  (CARAFATE ) 1 g tablet Take 1 tablet (1 g total) by mouth 4 (four) times daily -  with meals and at bedtime. Dissolve tablet in 30ml of water , mix and drink slurry (Patient not taking: Reported on 11/17/2023) 120 tablet 1   No current facility-administered medications for this visit.     Past Surgical History:  Procedure Laterality Date   BIOPSY  12/14/2020   Procedure: BIOPSY;  Surgeon: Urban Garden, MD;  [facility];  Service: Gastroenterology;;   BIOPSY  08/18/2023   Procedure: BIOPSY;  Surgeon: Urban Garden, MD;  [facility];  Service: Gastroenterology;;   CHOLECYSTECTOMY N/A 11/24/2018   Procedure: LAPAROSCOPIC CHOLECYSTECTOMY;  Surgeon: Alanda Allegra, MD;  Location: [facility];  Service: General;  Laterality: N/A;   CIRCUMCISION N/A 09/14/2017   Procedure: CIRCUMCISION;  Surgeon: Marco Severs, MD;  Location: [facility];  Service: Urology;  Laterality: N/A;  1 HR 336 [phone] - He has a Child psychotherapist that needs to be called if changes made [place] @ [phone] MEDICARE-[id number] MEDICAID-[id number] N   ESOPHAGOGASTRODUODENOSCOPY (EGD) WITH PROPOFOL  N/A 12/14/2020   castaneda:Normal esophagus. z line irregular, normal stomach, duodenal erosions without bleeding, neg h pylori, biopsies unremarkable.   ESOPHAGOGASTRODUODENOSCOPY (EGD) WITH PROPOFOL  N/A 08/18/2023   Procedure:  ESOPHAGOGASTRODUODENOSCOPY (EGD) WITH PROPOFOL ;  Surgeon: Urban Garden, MD;  [facility];  Service: Gastroenterology;  Laterality: N/A;  2:30pm;asa 3   HERNIA REPAIR     scrotum   MAXILLARY ANTROSTOMY Right 08/18/2018   Procedure: ENDOSCOPIC MAXILLARY RIGHT ANTROSTOMY WITH TISSUE REMOVAL;  Surgeon: Reynold Caves, MD;  [facility];  Service: ENT;  Laterality: Right;   NASAL SEPTOPLASTY W/ TURBINOPLASTY Bilateral 08/18/2018   Procedure: NASAL SEPTOPLASTY WITH TURBINATE REDUCTION;  Surgeon: Reynold Caves, MD;  [facility]: [facility];  Service: ENT;  Laterality: Bilateral;   NASAL SEPTUM SURGERY  08/18/2018   ORIF SHOULDER FRACTURE Right 02/28/2016   Procedure: OPEN REDUCTION INTERNAL FIXATION (ORIF) SHOULDER FRACTURE;  Surgeon: Darrin Emerald, MD;  [facility]: [facility];  Service: Orthopedics;  Laterality: Right;   SHOULDER CLOSED REDUCTION Right 12/04/2015   Procedure: CLOSED REDUCTION SHOULDER;  Surgeon:  Darrin Emerald, MD;  [facility]: [facility];  Service: Orthopedics;  Laterality: Right;   TOOTH EXTRACTION  spring 2016   top left      Allergies  Allergen Reactions   Methylphenidate Derivatives Other (See Comments)    Hallucinations       Family History  Problem Relation Age of Onset   Diabetes Mother    Diabetes Father      Social History Mr. Acre reports that he quit smoking about 14 years ago. His smoking use included e-cigarettes and cigarettes. He started smoking about 19 years ago. He has a 2.5 pack-year smoking history. He has never used smokeless tobacco. Mr. Covill reports that he does not currently use alcohol.    Physical Examination Today's Vitals   11/30/23 1318  BP: 119/85  Pulse: 78  SpO2: 96%  Weight: 261 lb (118.4 kg)   Body mass index is 37.45 kg/m.  Gen: resting comfortably, no acute distress HEENT: no scleral icterus, pupils equal round and reactive, no palptable cervical adenopathy,  CV: RRR, no mrg, no jvd Resp: Clear to auscultation  bilaterally GI: abdomen is soft, non-tender, non-distended, normal bowel sounds, no hepatosplenomegaly MSK: extremities are warm, no edema.  Skin: warm, no rash Neuro:  no focal deficits Psych: appropriate affect      Assessment and Plan  1.Palpitaitions - plan for 1 week zio patch to further evaluate  2. Chest pain - noncardiac in description. Varies in location, can be positional. Today reproducible to palpation - stress PET 2021 was benign - no plans for repeat ischemic testing - with SOB/DOE will repeat echo   F/u 6 weeks.       Laurann Pollock, M.D.

## 2023-12-08 ENCOUNTER — Telehealth: Payer: Self-pay | Admitting: Cardiology

## 2023-12-08 ENCOUNTER — Encounter (INDEPENDENT_AMBULATORY_CARE_PROVIDER_SITE_OTHER): Payer: Self-pay

## 2023-12-08 ENCOUNTER — Telehealth (INDEPENDENT_AMBULATORY_CARE_PROVIDER_SITE_OTHER): Payer: Self-pay | Admitting: Gastroenterology

## 2023-12-08 NOTE — Telephone Encounter (Signed)
 Pt spoke with Crystal and stated that he has not received instructions for TCS on 12/25/23. Re mailed instructions to pt.

## 2023-12-08 NOTE — Telephone Encounter (Signed)
 Office referred pt to us  but have not received any ov notes. Please fax to Attn: Amber at [phone]

## 2023-12-09 NOTE — Telephone Encounter (Signed)
 I spoke with the patient via phone, he just needed to change his phone number in the system, which it had already been changed.

## 2023-12-16 ENCOUNTER — Ambulatory Visit: Attending: Cardiology

## 2023-12-16 DIAGNOSIS — R079 Chest pain, unspecified: Secondary | ICD-10-CM | POA: Diagnosis not present

## 2023-12-17 ENCOUNTER — Ambulatory Visit (INDEPENDENT_AMBULATORY_CARE_PROVIDER_SITE_OTHER): Admitting: Urology

## 2023-12-17 ENCOUNTER — Telehealth: Payer: Self-pay | Admitting: *Deleted

## 2023-12-17 ENCOUNTER — Encounter: Payer: Self-pay | Admitting: Urology

## 2023-12-17 VITALS — BP 162/113 | HR 87

## 2023-12-17 DIAGNOSIS — R3911 Hesitancy of micturition: Secondary | ICD-10-CM | POA: Diagnosis not present

## 2023-12-17 DIAGNOSIS — N200 Calculus of kidney: Secondary | ICD-10-CM | POA: Diagnosis not present

## 2023-12-17 DIAGNOSIS — M6250 Muscle wasting and atrophy, not elsewhere classified, unspecified site: Secondary | ICD-10-CM

## 2023-12-17 DIAGNOSIS — R634 Abnormal weight loss: Secondary | ICD-10-CM

## 2023-12-17 DIAGNOSIS — G894 Chronic pain syndrome: Secondary | ICD-10-CM

## 2023-12-17 DIAGNOSIS — Z653 Problems related to other legal circumstances: Secondary | ICD-10-CM

## 2023-12-17 DIAGNOSIS — I10 Essential (primary) hypertension: Secondary | ICD-10-CM

## 2023-12-17 DIAGNOSIS — K219 Gastro-esophageal reflux disease without esophagitis: Secondary | ICD-10-CM

## 2023-12-17 DIAGNOSIS — N398 Other specified disorders of urinary system: Secondary | ICD-10-CM

## 2023-12-17 DIAGNOSIS — H539 Unspecified visual disturbance: Secondary | ICD-10-CM

## 2023-12-17 DIAGNOSIS — Z59819 Housing instability, housed unspecified: Secondary | ICD-10-CM

## 2023-12-17 DIAGNOSIS — K5903 Drug induced constipation: Secondary | ICD-10-CM

## 2023-12-17 DIAGNOSIS — K449 Diaphragmatic hernia without obstruction or gangrene: Secondary | ICD-10-CM

## 2023-12-17 DIAGNOSIS — G4730 Sleep apnea, unspecified: Secondary | ICD-10-CM

## 2023-12-17 DIAGNOSIS — D751 Secondary polycythemia: Secondary | ICD-10-CM

## 2023-12-17 LAB — URINALYSIS, ROUTINE W REFLEX MICROSCOPIC
Bilirubin, UA: NEGATIVE
Glucose, UA: NEGATIVE
Ketones, UA: NEGATIVE
Leukocytes,UA: NEGATIVE
Nitrite, UA: NEGATIVE
RBC, UA: NEGATIVE
Specific Gravity, UA: 1.025 (ref 1.005–1.030)
Urobilinogen, Ur: 1 mg/dL (ref 0.2–1.0)
pH, UA: 6 (ref 5.0–7.5)

## 2023-12-17 LAB — ECHOCARDIOGRAM COMPLETE
AR max vel: 4.71 cm2
AV Area VTI: 5.14 cm2
AV Area mean vel: 4.95 cm2
AV Mean grad: 2 mmHg
AV Peak grad: 4.7 mmHg
Ao pk vel: 1.08 m/s
Area-P 1/2: 5.66 cm2
Calc EF: 58.2 %
MV VTI: 5.01 cm2
S' Lateral: 2.9 cm
Single Plane A2C EF: 55.2 %
Single Plane A4C EF: 62.7 %

## 2023-12-17 MED ORDER — TAMSULOSIN HCL 0.4 MG PO CAPS: 0.4000 mg | ORAL_CAPSULE | Freq: Every day | ORAL | 5 refills | Status: AC

## 2023-12-17 NOTE — Progress Notes (Signed)
 Name: Kyle Collins DOB: 1985-10-05 MRN: [id number]  History of Present Illness: Kyle Collins is a 38 y.o. male who presents today for follow up visit at [facility] [place]. GU History includes: 1. Kidney stones.  At initial visit on 09/29/2023: Seen for 2x3 mm right ureteral stone with mild right hydronephrosis identified on CT in ER on 09/09/2023. Additional small stones (<4 mm) bilaterally.  Since last visit: > 09/29/2023: KUB negative for right ureteral stone.  > 11/08/2023: KUB report stated "The previous right ureteral stone is not seen. Left intrarenal calculus is unchanged. The known right intrarenal stones are obscured. Left pelvic phlebolith. Small to moderate colonic stool burden."  > 11/17/2023: Seen by GI for chronic abdominal pain, GERD, constipation, weight loss. He is scheduled for a colonoscopy on 12/25/2023.  > 11/26/2023: RUS showed "Bilateral nonobstructing renal stones. No hydronephrosis bilaterally."  Today: He denies recent stone passage. He continues to have chronic abdominal pain and back pain from his multiple non-GU complex health issues.  He reports intermittent difficulty voiding with urinary hesitancy and weak intermittent urinary stream.    Medications: Current Outpatient Medications  Medication Sig Dispense Refill   amLODipine  (NORVASC ) 10 MG tablet Take 1 tablet (10 mg total) by mouth daily. 30 tablet 1   atorvastatin (LIPITOR) 10 MG tablet Take 10 mg by mouth daily.     chlorthalidone (HYGROTON) 50 MG tablet Take 50 mg by mouth daily.     dicyclomine  (BENTYL ) 10 MG capsule Take 1 capsule (10 mg total) by mouth every 12 (twelve) hours as needed. 180 capsule 3   gemfibrozil (LOPID) 600 MG tablet Take 600 mg by mouth 2 (two) times daily.     Insulin  Glargine (BASAGLAR  KWIKPEN) 100 UNIT/ML Inject 15 Units into the skin daily.     Insulin  Pen Needle (ABOUTTIME PEN NEEDLE) 31G X 8 MM MISC Use as needed to inject insulin  daily as instructed 150 each 1    JARDIANCE  10 MG TABS tablet Take 10 mg by mouth daily.     Lactulose  20 GM/30ML SOLN Take 15 mLs (10 g total) by mouth daily at 12 noon. 60 mL 0   lisinopril  (PRINIVIL ,ZESTRIL ) 40 MG tablet Take 1 tablet (40 mg total) by mouth daily. 30 tablet 1   meclizine  (ANTIVERT ) 25 MG tablet Take 1 tablet (25 mg total) by mouth 3 (three) times daily as needed for dizziness. 30 tablet 0   metFORMIN  (GLUCOPHAGE ) 500 MG tablet Take 1,000 mg by mouth 2 (two) times daily.     methocarbamol  (ROBAXIN ) 500 MG tablet Take 1 tablet (500 mg total) by mouth 2 (two) times daily. 20 tablet 0   metoprolol  succinate (TOPROL -XL) 100 MG 24 hr tablet Take 100 mg by mouth daily.     naloxegol  oxalate (MOVANTIK ) 12.5 MG TABS tablet Take 1 tablet (12.5 mg total) by mouth daily. 30 tablet 1   naproxen  (NAPROSYN ) 500 MG tablet Take 1 tablet (500 mg total) by mouth 2 (two) times daily. 20 tablet 0   naproxen  (NAPROSYN ) 500 MG tablet Take 1 tablet (500 mg total) by mouth 2 (two) times daily. 30 tablet 0   omeprazole  (PRILOSEC) 40 MG capsule Take 1 capsule (40 mg total) by mouth daily. 90 capsule 3   ondansetron  (ZOFRAN -ODT) 4 MG disintegrating tablet Take 1 tablet (4 mg total) by mouth every 8 (eight) hours as needed for nausea or vomiting. 90 tablet 3   Oxycodone  HCl 10 MG TABS Take 10 mg by mouth 2 (two)  times daily.     polyethylene glycol (MIRALAX ) 17 g packet Take 17 g by mouth daily. 14 each 0   potassium chloride  SA (KLOR-CON ) 20 MEQ tablet Take 20 mEq by mouth daily.     sucralfate  (CARAFATE ) 1 g tablet Take 1 tablet (1 g total) by mouth 4 (four) times daily -  with meals and at bedtime. Dissolve tablet in 30ml of water , mix and drink slurry 120 tablet 1   tamsulosin  (FLOMAX ) 0.4 MG CAPS capsule Take 1 capsule (0.4 mg total) by mouth daily. 30 capsule 5   No current facility-administered medications for this visit.    Allergies: Allergies  Allergen Reactions   Methylphenidate Derivatives Other (See Comments)     Hallucinations     Past Medical History:  Diagnosis Date   ADHD (attention deficit hyperactivity disorder)    Anxiety    Bipolar disorder (HCC)    Depression    Diabetes mellitus    GERD (gastroesophageal reflux disease)    Headache    Hemochromatosis 2019   Hypertension    Polycythemia    Sleep apnea    Thyroid  disease    Past Surgical History:  Procedure Laterality Date   BIOPSY  12/14/2020   Procedure: BIOPSY;  Surgeon: Urban Garden, MD;  [facility];  Service: Gastroenterology;;   BIOPSY  08/18/2023   Procedure: BIOPSY;  Surgeon: Urban Garden, MD;  [facility];  Service: Gastroenterology;;   CHOLECYSTECTOMY N/A 11/24/2018   Procedure: LAPAROSCOPIC CHOLECYSTECTOMY;  Surgeon: Alanda Allegra, MD;  [facility]: [facility];  Service: General;  Laterality: N/A;   CIRCUMCISION N/A 09/14/2017   Procedure: CIRCUMCISION;  Surgeon: Marco Severs, MD;  [facility]: [facility];  Service: Urology;  Laterality: N/A;  1 HR 336 [phone] - He has a Child psychotherapist that needs to be called if changes made [place] @ [phone] MEDICARE-[id number] MEDICAID-[id number] N   ESOPHAGOGASTRODUODENOSCOPY (EGD) WITH PROPOFOL  N/A 12/14/2020   castaneda:Normal esophagus. z line irregular, normal stomach, duodenal erosions without bleeding, neg h pylori, biopsies unremarkable.   ESOPHAGOGASTRODUODENOSCOPY (EGD) WITH PROPOFOL  N/A 08/18/2023   Procedure: ESOPHAGOGASTRODUODENOSCOPY (EGD) WITH PROPOFOL ;  Surgeon: Urban Garden, MD;  [facility];  Service: Gastroenterology;  Laterality: N/A;  2:30pm;asa 3   HERNIA REPAIR     scrotum   MAXILLARY ANTROSTOMY Right 08/18/2018   Procedure: ENDOSCOPIC MAXILLARY RIGHT ANTROSTOMY WITH TISSUE REMOVAL;  Surgeon: Reynold Caves, MD;  [facility];  Service: ENT;  Laterality: Right;   NASAL SEPTOPLASTY W/ TURBINOPLASTY Bilateral 08/18/2018   Procedure: NASAL SEPTOPLASTY WITH TURBINATE REDUCTION;  Surgeon: Reynold Caves,  MD;  [facility];  Service: ENT;  Laterality: Bilateral;   NASAL SEPTUM SURGERY  08/18/2018   ORIF SHOULDER FRACTURE Right 02/28/2016   Procedure: OPEN REDUCTION INTERNAL FIXATION (ORIF) SHOULDER FRACTURE;  Surgeon: Darrin Emerald, MD;  [facility]: [facility];  Service: Orthopedics;  Laterality: Right;   SHOULDER CLOSED REDUCTION Right 12/04/2015   Procedure: CLOSED REDUCTION SHOULDER;  Surgeon: Darrin Emerald, MD;  Location: [facility];  Service: Orthopedics;  Laterality: Right;   TOOTH EXTRACTION  spring 2016   top left    Family History  Problem Relation Age of Onset   Diabetes Mother    Diabetes Father    Social History   Socioeconomic History   Marital status: Single    Spouse name: Not on file   Number of children: Not on file   Years of education: Not on file   Highest  education level: Not on file  Occupational History   Not on file  Tobacco Use   Smoking status: Former    Current packs/day: 0.00    Average packs/day: 0.5 packs/day for 5.0 years (2.5 ttl pk-yrs)    Types: E-cigarettes, Cigarettes    Start date: 02/25/2004    Quit date: 02/24/2009    Years since quitting: 14.8   Smokeless tobacco: Never  Vaping Use   Vaping status: Former   Quit date: 12/13/2018  Substance and Sexual Activity   Alcohol use: Not Currently    Comment: once year   Drug use: No    Comment: Hemp and cbd   Sexual activity: Never  Other Topics Concern   Not on file  Social History Narrative   Not on file   Social Drivers of Health   Financial Resource Strain: Low Risk  (04/18/2022)   Received [facility], [facility]   Overall Financial Resource Strain (CARDIA)    Difficulty of Paying Living Expenses: Not hard at all  Food Insecurity: No Food Insecurity (04/18/2022)   Received from [facility], [facility]   Hunger Vital Sign    Worried About Running Out of Food in the Last Year: Never true    Ran Out of Food in the Last Year: Never true  Transportation  Needs: No Transportation Needs (04/18/2022)   Received from [facility], [facility]   [facility] - Transportation    Lack of Transportation (Medical): No    Lack of Transportation (Non-Medical): No  Physical Activity: Inactive (04/18/2022)   Received from [facility], [facility]   Exercise Vital Sign    Days of Exercise per Week: 0 days    Minutes of Exercise per Session: 0 min  Stress: No Stress Concern Present (04/18/2022)   Received from [facility], [facility] - Occupational Stress Questionnaire    Feeling of Stress : Not at all  Social Connections: Socially Isolated (04/18/2022)   Received from [facility], [facility]   Social Connection and Isolation Panel [NHANES]    Frequency of Communication with Friends and Family: Three times a week    Frequency of Social Gatherings with Friends and Family: Once a week    Attends Religious Services: Never    Database administrator or Organizations: No    Attends Banker Meetings: Never    Marital Status: Never married  Intimate Partner Violence: Not At Risk (04/18/2022)   Received from [facility], [facility]   Humiliation, Afraid, Rape, and Kick questionnaire    Fear of Current or Ex-Partner: No    Emotionally Abused: No    Physically Abused: No    Sexually Abused: No    SUBJECTIVE  Review of Systems Constitutional: Patient denies any unintentional weight loss or change in strength lntegumentary: Patient denies any rashes or pruritus Cardiovascular: Patient denies chest pain or syncope Respiratory: Patient denies shortness of breath Gastrointestinal: Patient reports ongoing GI issues and has upcoming colonoscopy planned  Musculoskeletal: As per HPI Neurologic: Patient denies convulsions or seizures Psychologic: Patient reports severe life stressors which are currently exacerbated by his physical & mental health issues, legal proceedings  with anticipated arrest next week, and financial insecurity with resultant housing instability Hematologic/Lymphatic: Patient denies bleeding tendencies Endocrine: Patient denies heat/cold intolerance  GU: As per HPI.  OBJECTIVE Vitals:   12/17/23 1341  BP: (!) 162/113  Pulse: 87   There is no height or weight on file to calculate BMI.  Physical Examination Constitutional: Appears stressed today; non-toxic appearing  Cardiovascular: No visible lower extremity edema.  Respiratory: The patient does not have audible wheezing/stridor; respirations do not appear labored  Gastrointestinal: Abdomen non-distended Musculoskeletal: Normal ROM of UEs  Skin: No obvious rashes/open sores  Neurologic: CN 2-12 grossly intact Psychiatric: Answered questions appropriately with pleasant normal affect  Hematologic/Lymphatic/Immunologic: No obvious bruises or sites of spontaneous bleeding  UA: trace protein; otherwise unremarkable  ASSESSMENT Kidney stones - Plan: Urinalysis, Routine w reflex microscopic, AMB Referral [facility] Care Management, DG Abd 1 View  Sleep apnea, unspecified type - Plan: AMB Referral [facility] Care Management  Essential hypertension, benign - Plan: AMB Referral [facility] Management  Hiatal hernia - Plan: AMB Referral [facility] Care Management  Gastroesophageal reflux disease without esophagitis - Plan: AMB Referral [facility] Management  Polycythemia - Plan: AMB Referral [facility] Management  Morbid obesity due to excess calories (HCC) - Plan: AMB Referral [facility] Management  Chronic pain syndrome - Plan: AMB Referral [facility] Care Management, tamsulosin  (FLOMAX ) 0.4 MG CAPS capsule  Housing instability - Plan: AMB Referral [facility] Management  Legal problem - Plan: AMB Referral [facility] Management  Involved in legal proceedings - Plan: AMB Referral [facility] Management  Vision abnormalities - Plan: AMB Referral [facility] Management  Drug-induced constipation - Plan: AMB  Referral [facility] Care Management  Muscular atrophy, unspecified site - Plan: AMB Referral [facility] Care Management  Weight loss, non-intentional - Plan: AMB Referral [facility] Care Management  Voiding dysfunction - Plan: tamsulosin  (FLOMAX ) 0.4 MG CAPS capsule  Urinary hesitancy - Plan: tamsulosin  (FLOMAX ) 0.4 MG CAPS capsule  We reviewed recent imaging results; no acute GU findings. For stone prevention: Advised adequate hydration and we discussed option to consider low oxalate diet given that calcium oxalate is the most common type of stone. Handout provided about stone prevention diet.  We reviewed the likely diagnosis of high-tone pelvic floor dysfunction secondary to his chronic back and abdominal pain. Discussed how that can contribute to pain along with voiding and defecatory dysfunction, including his intermittent urinary hesitancy and weak intermittent stream. We discussed management options including relaxation techniques, yoga, stretching, muscle relaxers. Will trial Flomax  (Tamsulosin ) 0.4 mg daily for now.   For his complex psychosocial, legal, and financial hardships we agreed to proceed with an emergent referral to the [facility] Coordination / Care Management group for assistance.   Will plan to follow up in 6 months with KUB for stone surveillance or sooner if needed. Patient verbalized understanding of and agreement with current plan. All questions were answered.  PLAN Advised the following: Start Flomax  (Tamsulosin ) 0.4 mg daily. Maintain adequate fluid intake daily. Drink citrus juice (lemon, lime or orange juice) routinely. Low oxalate diet. Emergent referral to the [facility] Coordination / Care Management group for assistance.  Return in about 6 months (around 06/18/2024) for KUB, UA, & f/u with Griselda Lederer NP.  Orders Placed This Encounter  Procedures   DG Abd 1 View    Standing Status:   Future    Expected Date:   06/18/2024    Expiration Date:   12/16/2024     Reason for Exam (SYMPTOM  OR DIAGNOSIS REQUIRED):   kidney stone    Preferred imaging location?:   [facility]   Urinalysis, Routine w reflex microscopic   AMB Referral [facility] Care  Management    Referral Priority:   Routine    Referral Type:   Consultation    Referral Reason:   Care Coordination    Number of Visits Requested:   1   Total time spent caring for the patient today was over 40 minutes. This includes time spent on the date of the visit reviewing the patient's chart before the visit, time spent during the visit, and time spent after the visit on documentation. Over 50% of that time was spent in face-to-face time with this patient for direct counseling. E&M based on time and complexity of medical decision making.  It has been explained that the patient is to follow regularly with their PCP in addition to all other providers involved in their care and to follow instructions provided by these respective offices. Patient advised to contact urology clinic if any urologic-pertaining questions, concerns, new symptoms or problems arise in the interim period.  There are no Patient Instructions on file for this visit.  Electronically signed by:  Lauretta Ponto, MSN, FNP-C, CUNP 12/17/2023 2:35 PM

## 2023-12-17 NOTE — Progress Notes (Signed)
 Complex Care Management Note  Care Guide Note 12/17/2023 Name: SALAM MICUCCI MRN: [id number] DOB: [birth year]  Ave Leisure is a 38 y.o. year old male who sees Hasanaj, Goble Last, MD for primary care. I reached out to PANAGIOTIS OELKERS by phone today to offer complex care management services.  Mr. Ebron was given information about Complex Care Management services today including:   The Complex Care Management services include support from the care team which includes your Nurse Care Manager, Clinical Social Worker, or Pharmacist.  The Complex Care Management team is here to help remove barriers to the health concerns and goals most important to you. Complex Care Management services are voluntary, and the patient may decline or stop services at any time by request to their care team member.   Complex Care Management Consent Status: Patient agreed to services and verbal consent obtained.   Follow up plan:  Telephone appointment with complex care management team member scheduled for:  12/18/2023  Encounter Outcome:  Patient Scheduled  Kandis Ormond, CMA,   [facility], [place] Guide Direct Dial: [phone]  Fax: [phone] Website: [email or web address]

## 2023-12-18 ENCOUNTER — Encounter (HOSPITAL_COMMUNITY): Payer: Self-pay

## 2023-12-18 ENCOUNTER — Other Ambulatory Visit: Payer: Self-pay

## 2023-12-18 ENCOUNTER — Ambulatory Visit (HOSPITAL_COMMUNITY): Attending: Anesthesiology

## 2023-12-18 DIAGNOSIS — Z7409 Other reduced mobility: Secondary | ICD-10-CM | POA: Diagnosis present

## 2023-12-18 DIAGNOSIS — M5459 Other low back pain: Secondary | ICD-10-CM | POA: Insufficient documentation

## 2023-12-18 DIAGNOSIS — R208 Other disturbances of skin sensation: Secondary | ICD-10-CM | POA: Diagnosis present

## 2023-12-18 NOTE — Patient Instructions (Signed)
 Visit Information  Thank you for taking time to visit with me today. Please don't hesitate to contact me if I can be of assistance to you before our next scheduled appointment.  Our next appointment is by telephone on 12/24/23 at 10:30am Please call the care guide team at [phone] if you need to cancel or reschedule your appointment.   Following is a copy of your care plan:   Goals Addressed             This Visit's Progress    BSW-VBCI Social Work Care Plan       Problems:   Housing   CSW Clinical Goal(s):   Over the next 7 days the Patient will will follow up with housing resources as directed by Social Work.  Interventions:  Social Determinants of Health in Patient with DMII and HTN: SDOH assessments completed: Housing  Evaluation of current treatment plan related to unmet needs Housing resources:  Patient Goals/Self-Care Activities:  Patient agreed to call housing resources and use [email or web address] to locate options that meet legal restrictions.  Plan:   Telephone follow up appointment with care management team member scheduled for:  12/24/23 at 10:30am        Please call 911 if you are experiencing a Mental Health or Behavioral Health Crisis or need someone to talk to.  Patient verbalizes understanding of instructions and care plan provided today and agrees to view in MyChart. Active MyChart status and patient understanding of how to access instructions and care plan via MyChart confirmed with patient.     Dallis Dues, BSW [place]  [facility], [facility] Social Worker Direct Dial: [phone]  Fax: [phone] Website: [email or web address]

## 2023-12-18 NOTE — Therapy (Addendum)
 OUTPATIENT PHYSICAL THERAPY THORACOLUMBAR EVALUATION   Patient Name: Kyle Collins MRN: [id number] DOB:10-Jul-1986, 38 y.o., male Today's Date: 12/18/2023  END OF SESSION:  PT End of Session - 12/18/23 1124     Visit Number 1    Date for PT Re-Evaluation 01/22/24    Authorization Type MEDICARE PART A AND B    Authorization Time Period no auth required    Authorization - Visit Number 0    Progress Note Due on Visit 10    PT Start Time 0930    PT Stop Time 1015    PT Time Calculation (min) 45 min    Activity Tolerance Patient limited by fatigue;Patient limited by pain    Behavior During Therapy Anxious;Impulsive             Past Medical History:  Diagnosis Date   ADHD (attention deficit hyperactivity disorder)    Anxiety    Bipolar disorder (HCC)    Depression    Diabetes mellitus    GERD (gastroesophageal reflux disease)    Headache    Hemochromatosis 2019   Hypertension    Polycythemia    Sleep apnea    Thyroid  disease    Past Surgical History:  Procedure Laterality Date   BIOPSY  12/14/2020   Procedure: BIOPSY;  Surgeon: Urban Garden, MD;  [facility];  Service: Gastroenterology;;   BIOPSY  08/18/2023   Procedure: BIOPSY;  Surgeon: Urban Garden, MD;  [facility];  Service: Gastroenterology;;   CHOLECYSTECTOMY N/A 11/24/2018   Procedure: LAPAROSCOPIC CHOLECYSTECTOMY;  Surgeon: Alanda Allegra, MD;  Location: [facility];  Service: General;  Laterality: N/A;   CIRCUMCISION N/A 09/14/2017   Procedure: CIRCUMCISION;  Surgeon: Marco Severs, MD;  Location: [facility];  Service: Urology;  Laterality: N/A;  1 HR 336 [phone] - He has a Child psychotherapist that needs to be called if changes made [place] @ [phone] MEDICARE-[id number] MEDICAID-[id number] N   ESOPHAGOGASTRODUODENOSCOPY (EGD) WITH PROPOFOL  N/A 12/14/2020   castaneda:Normal esophagus. z line irregular, normal stomach, duodenal erosions without bleeding, neg h pylori,  biopsies unremarkable.   ESOPHAGOGASTRODUODENOSCOPY (EGD) WITH PROPOFOL  N/A 08/18/2023   Procedure: ESOPHAGOGASTRODUODENOSCOPY (EGD) WITH PROPOFOL ;  Surgeon: Urban Garden, MD;  [facility];  Service: Gastroenterology;  Laterality: N/A;  2:30pm;asa 3   HERNIA REPAIR     scrotum   MAXILLARY ANTROSTOMY Right 08/18/2018   Procedure: ENDOSCOPIC MAXILLARY RIGHT ANTROSTOMY WITH TISSUE REMOVAL;  Surgeon: Reynold Caves, MD;  Location: [facility];  Service: ENT;  Laterality: Right;   NASAL SEPTOPLASTY W/ TURBINOPLASTY Bilateral 08/18/2018   Procedure: NASAL SEPTOPLASTY WITH TURBINATE REDUCTION;  Surgeon: Reynold Caves, MD;  Location: [facility] OR;  Service: ENT;  Laterality: Bilateral;   NASAL SEPTUM SURGERY  08/18/2018   ORIF SHOULDER FRACTURE Right 02/28/2016   Procedure: OPEN REDUCTION INTERNAL FIXATION (ORIF) SHOULDER FRACTURE;  Surgeon: Darrin Emerald, MD;  Location: [facility];  Service: Orthopedics;  Laterality: Right;   SHOULDER CLOSED REDUCTION Right 12/04/2015   Procedure: CLOSED REDUCTION SHOULDER;  Surgeon: Darrin Emerald, MD;  Location: [facility];  Service: Orthopedics;  Laterality: Right;   TOOTH EXTRACTION  spring 2016   top left    Patient Active Problem List   Diagnosis Date Noted   Change in stool caliber 11/17/2023   RLQ abdominal pain 11/17/2023   Hiatal hernia 09/29/2023   Drug-induced constipation 09/29/2023   Kidney stones 09/28/2023   Early satiety 07/02/2023   Bloating 07/02/2023   Nausea without  vomiting 07/02/2023   Impingement syndrome of left shoulder 02/20/2022   Chronic RUQ pain 11/22/2020   DKA (diabetic ketoacidosis) (HCC) 11/22/2018   DKA (diabetic ketoacidosis) (HCC) 11/20/2018   Sleep apnea 11/20/2018   Gastroesophageal reflux disease without esophagitis 11/20/2018   Polycythemia 11/20/2018   Hyperbilirubinemia 11/20/2018   S/P nasal septoplasty 08/18/2018   Fracture dislocation of right shoulder joint 12/04/2015   Dislocation, shoulder closed     Greater tuberosity of humerus fracture    Morbid obesity due to excess calories (HCC) 10/26/2015   Uncontrolled type 2 diabetes mellitus without complication, without Glauser-term current use of insulin  07/16/2015   Essential hypertension, benign 07/16/2015   Personal history of noncompliance with medical treatment, presenting hazards to health 07/16/2015    PCP: Veda Gerald, MD   REFERRING PROVIDER: Gayla Katz, MD  REFERRING DIAG: M54.50 (ICD-10-CM) - Lumbago  Rationale for Evaluation and Treatment: Rehabilitation  THERAPY DIAG:  Other low back pain  Impaired functional mobility, balance, gait, and endurance  Impaired sensation to light touch  ONSET DATE: Late December of 2024  SUBJECTIVE:                                                                                                                                                                                           SUBJECTIVE STATEMENT: Pt states he has been suffering from spine muscle wasting. Pt states he did not eat from last October utile 2-3 months ago. Pt states after he got his tetanus shot he lost his appetite. Pt states he lost 100 lbs in the last 6 months. Pt states he has blood cancer but is unable to recall when last treatment was. Pain in ribs and flank areas. Pt states this all started with really sharp pains under his shoulder blades. Pt states he has been using the [facility] for the last month. Pt states he has a colonoscopy coming up. Pt states he can barely put any weight on the LLE due to left foot pain. Pt states he has had difficulty with voiding urine for the last few months with inconsistent stream.  PERTINENT HISTORY:  Dislocation and shattering of right shoulder  Diabetes, Neuropathy Blood cancer Sleep apnea Kidney stones Bipolar disorder PTSD  PAIN:  Are you having pain? Yes: NPRS scale: 8/10 Pain location: see eval subjective Pain description: crushing pressure Aggravating factors:  standing for a couple minutes, sitting up for a Ficken time,  Relieving factors: oxycodone , laying down  PRECAUTIONS: Other: Pt has blood cancer  RED FLAGS: Bowel or bladder incontinence: Yes: last several months has had difficulty urinating   WEIGHT BEARING RESTRICTIONS: No  FALLS:  Has patient fallen in last 6 months? No  LIVING ENVIRONMENT: Lives with: lives with their family and lives alone Lives in: House/apartment Stairs: No Has following equipment at home: Single point cane  OCCUPATION: disability  PLOF: Independent with basic ADLs  PATIENT GOALS: rebuild spine musculature and not be in so much pain  NEXT MD VISIT: couple months  OBJECTIVE:  Note: Objective measures were completed at Evaluation unless otherwise noted.  DIAGNOSTIC FINDINGS:  See pt chart  PATIENT SURVEYS:  Modified Oswestry 31/50   COGNITION: Overall cognitive status: Within functional limits for tasks assessed and Pt reports having bipolar issues     SENSATION: Light touch: Impaired    POSTURE: rounded shoulders, forward head, decreased lumbar lordosis, and increased thoracic kyphosis  PALPATION: Pt unable to tolerate light touch to any areas of back, low back pain seems to be originating from L2-L3 segments.  LUMBAR ROM:   AROM eval  Flexion Pain, unable to demonstrate much ROM at all  Extension Too painful to assess  Right lateral flexion Too painful to complete, severely limited ROM  Left lateral flexion Too painful to complete, severely limited ROM  Right rotation Too painful to complete, severely limited ROM  Left rotation Too painful to complete, severely limited ROM   (Blank rows = not tested)  LOWER EXTREMITY ROM:     Active  Right eval Left eval  Hip flexion    Hip extension    Hip abduction    Hip adduction    Hip internal rotation    Hip external rotation    Knee flexion    Knee extension    Ankle dorsiflexion    Ankle plantarflexion    Ankle inversion     Ankle eversion     (Blank rows = not tested)  LOWER EXTREMITY MMT:    MMT Right eval Left eval  Hip flexion 3 3  Hip extension 3 3  Hip abduction 3 3  Hip adduction 3 3  Hip internal rotation    Hip external rotation    Knee flexion    Knee extension    Ankle dorsiflexion 3 Pain, 3+  Ankle plantarflexion    Ankle inversion    Ankle eversion     (Blank rows = not tested) Limited effort given   LUMBAR SPECIAL TESTS:  Unable to perform due to pain.  FUNCTIONAL TESTS:  5 times sit to stand: 48.71, increased back pain Timed up and go (TUG): 30.76 seconds  GAIT: Distance walked: 60 feet to and from treatment area Assistive device utilized: Single point cane Level of assistance: Modified independence Comments: antalgic pattern, decreased stride length, decreased velocity  TREATMENT DATE:  12/18/2023   Evaluation: -ROM measured, Strength assessed, HEP prescribed, pt educated on prognosis, findings, and importance of HEP compliance if given.  PATIENT EDUCATION:  Education details: Pt was educated on findings of PT evaluation, prognosis, frequency of therapy visits and rationale, attendance policy, and HEP if given.   Person educated: Patient Education method: Explanation, Verbal cues, and Handouts Education comprehension: verbalized understanding, verbal cues required, and needs further education  HOME EXERCISE PROGRAM: Access Code: [email or web address] URL: [email or web address] Date: 12/18/2023 Prepared by: Armond Bertin  Exercises - Sit to Stand with Armchair  - 1 x daily - 7 x weekly - 3 sets - 10 reps - Supine Bridge  - 1 x daily - 7 x weekly - 3 sets - 10 reps - 3s hold - Supine Lower Trunk Rotation  - 1 x daily - 7 x weekly - 3 sets - 10 reps  ASSESSMENT:  CLINICAL IMPRESSION: Patient is a 38 y.o. male who was seen today  for physical therapy evaluation and treatment for M54.50 (ICD-10-CM) - Lumbago.   Patient demonstrates decreased activity tolerance, LE and core strength, avoidance behavior with functional mobility, and increased pain response with minimal palpation. Patient also demonstrates difficulty with ambulation with SPC during today's session with antalgic gait pattern, decreased stride length, and velocity noted. Patient also demonstrates severe tenderness to low and mid back musculature and spinal segments. Patient requires education on the role of PT and the need for decreased pain symptoms for optimal performance with physical therapy. Pts potential to respond to therapy placed at fair due to complex medical history, psychological involvement and multiple co morbidities. Patient would benefit from skilled physical therapy for increased endurance with ambulation, increased LE/core strength, and balance for improved gait quality, return to higher level of function with ADLs, and progress towards therapy goals.   OBJECTIVE IMPAIRMENTS: Abnormal gait, decreased activity tolerance, decreased balance, decreased endurance, decreased knowledge of use of DME, decreased mobility, difficulty walking, decreased ROM, decreased strength, impaired flexibility, impaired sensation, postural dysfunction, and pain.   ACTIVITY LIMITATIONS: carrying, lifting, bending, sitting, standing, squatting, stairs, transfers, bed mobility, continence, and locomotion level  PARTICIPATION LIMITATIONS: meal prep, cleaning, laundry, driving, community activity, occupation, and yard work  PERSONAL FACTORS: Age, Fitness, Past/current experiences, Time since onset of injury/illness/exacerbation, and 3+ comorbidities: diabetes, cancer, kidney stones are also affecting patient's functional outcome.   REHAB POTENTIAL: Fair co morbidities, pain level  CLINICAL DECISION MAKING: Evolving/moderate complexity  EVALUATION COMPLEXITY:  Moderate   GOALS: Goals reviewed with patient? No  SHORT TERM GOALS: Target date: 01/08/24  Pt will be independent with HEP in order to demonstrate participation in Physical Therapy POC.  Baseline: Goal status: INITIAL  2.  Pt will report 6/10 pain with mobility in order to demonstrate improved pain with ADLs.  Baseline: 8/10 Goal status: INITIAL  [place] TERM GOALS: Target date: 01/29/24  Pt will improve TUG by 2 seconds in order to demonstrate improved functional strength to return to desired activities.  Baseline: see objective.  Goal status: INITIAL  2.  Pt will improve 2 MWT by 140 in order to demonstrate improved functional ambulatory capacity in community setting.  Baseline: unable to perform due to pain, see objective.  Goal status: INITIAL  3.  Pt will improve Modified Oswestry score by 6 points in order to demonstrate improved pain with functional goals and outcomes. Baseline: see objective.  Goal status: INITIAL  4.  Pt will report 4/10 pain with mobility in order to demonstrate reduced pain with ADLs lasting greater than 30 minutes.  Baseline: 8/10, see objective.  Goal status: INITIAL   PLAN:  PT FREQUENCY: 1-2x/week  PT DURATION: 6 weeks  PLANNED INTERVENTIONS: 97110-Therapeutic exercises, 97530- Therapeutic activity, [id number]- Neuromuscular re-education, 97535- Self Care, 16109- Manual therapy, [id number]- Gait training, Patient/Family education, Balance training, Stair training, Spinal mobilization, DME instructions, Cryotherapy, Moist heat, and ESTIM.  PLAN FOR NEXT SESSION: review goals and HEP, progress core and LE strengthening, progress endurance with gait, AD education, assess balance   Armond Bertin, PT, DPT [facility] Office: [phone] 11:31 AM, 12/18/23

## 2023-12-18 NOTE — Patient Outreach (Addendum)
 Complex Care Management   Visit Note  12/18/2023  Name:  Kyle Collins MRN: [id number] DOB: January 26, 1986  Situation: Referral received for Complex Care Management related to SDOH Barriers:  Housing options I obtained verbal consent from Patient.  Visit completed with patient  on the phone  Background:   Past Medical History:  Diagnosis Date   ADHD (attention deficit hyperactivity disorder)    Anxiety    Bipolar disorder (HCC)    Depression    Diabetes mellitus    GERD (gastroesophageal reflux disease)    Headache    Hemochromatosis 2019   Hypertension    Polycythemia    Sleep apnea    Thyroid  disease     Assessment:  Patient reports he has a legal matter that may limit his ability to remain where he lives and may have to move within 30 days.  Patient will know more details next week. Once court decision is made, patient will not be allowed access to electronic devices and will confirm if he can have a cell phone that is not a smart phone or a landline.  Patient is currently living in a unit under [employer] and is struggling with getting his Social Worker to confirm if he can move the waiver.  Patient has not been able to find a unit that meets the courts requirements.  Sw provided a list of private landlords and other housing options.  Patient has an aunt who is helping but no other family or friends can help at this time.  Patient can only afford $600-$650 in rent.  Sw provides [email or web address] and patient is able to find a few options in his price range.  SW was unable to complete assessment and will complete at next visit.   Recommendation:   No recommendations  Follow Up Plan:   Telephone follow up appointment date/time:  12/24/23 at 10:30am  Dallis Dues, BSW [place]  [facility], [facility] Social Worker Direct Dial: [phone]  Fax: [phone] Website: [email or web address]

## 2023-12-22 ENCOUNTER — Encounter (HOSPITAL_COMMUNITY)
Admission: RE | Admit: 2023-12-22 | Discharge: 2023-12-22 | Disposition: A | Discharge status: Home / Self Care | Source: Ambulatory Visit | Attending: Gastroenterology | Admitting: Gastroenterology

## 2023-12-22 ENCOUNTER — Other Ambulatory Visit: Payer: Self-pay

## 2023-12-22 ENCOUNTER — Encounter (HOSPITAL_COMMUNITY): Payer: Self-pay

## 2023-12-23 ENCOUNTER — Encounter (INDEPENDENT_AMBULATORY_CARE_PROVIDER_SITE_OTHER): Payer: Self-pay

## 2023-12-23 ENCOUNTER — Telehealth (INDEPENDENT_AMBULATORY_CARE_PROVIDER_SITE_OTHER): Payer: Self-pay

## 2023-12-23 NOTE — Telephone Encounter (Signed)
 Spoke with pt and he stated he was able to get his oral prep and have miralax . He just can't get the bisacodyl or simethicone  drops. Can he proceed or do we need to reschedule?

## 2023-12-23 NOTE — Telephone Encounter (Signed)
 Patient can not afford Dulcolax or simethecone prior to the procedure and will need to reschedule procedure to when he can afford to buy these.

## 2023-12-24 ENCOUNTER — Other Ambulatory Visit: Payer: Self-pay

## 2023-12-24 NOTE — Telephone Encounter (Signed)
 Hi It would be ok to do it without the simethicone  and Dulcolax

## 2023-12-24 NOTE — Telephone Encounter (Signed)
 Patient called back and he wants to proceed with cancelling for now. He will call back when he can afford everything to be done.

## 2023-12-24 NOTE — Patient Instructions (Signed)
 Visit Information  Thank you for taking time to visit with me today. Please don't hesitate to contact me if I can be of assistance to you before our next scheduled appointment.  Your next care management appointment is by telephone on 01/25/24 at 9am  Please call the care guide team at [phone] if you need to cancel, schedule, or reschedule an appointment.   Please call 911 if you are experiencing a Mental Health or Behavioral Health Crisis or need someone to talk to.  Kyle Collins, BSW Kyle Collins  [facility], [facility] Social Worker Direct Dial: [phone]  Fax: [phone] Website: [email or web address]

## 2023-12-24 NOTE — Telephone Encounter (Signed)
 Pt thought he cancelled already and did not start clear liquids. He stated he has upcoming appt in July with Dr. Sammi Crick and wants to see how his finances would be at that time before rescheduling.

## 2023-12-24 NOTE — Patient Outreach (Signed)
 Complex Care Management   Visit Note  12/24/2023  Name:  Kyle Collins MRN: [id number] DOB: 09-May-1986  Situation: Referral received for Complex Care Management related to SDOH Barriers:  Housing soon to relocate I obtained verbal consent from Patient.  Visit completed with patient and DSS Rep Payee SW Onalee Bien  on the phone  Background:   Past Medical History:  Diagnosis Date   ADHD (attention deficit hyperactivity disorder)    Anxiety    Bipolar disorder (HCC)    Depression    Diabetes mellitus    GERD (gastroesophageal reflux disease)    Headache    Hemochromatosis 2019   Hypertension    Polycythemia    Sleep apnea    Thyroid  disease     Assessment:  Patient reports his attorney expects court Wed/Thur of this week and patient will be placed in jail until bail is met.  Patient has found a housing option in [place] at $700 for rent/utilities.  If patient relocates to [facility] he would need to reapply for [employer].  SW completed budget.  Patient receives $1014 SSI/SSA and would have enough for rent, but other legal expenses would cause him to be in crisis.  Patient also receives In Home Special Assistance benefits of [phone] but is concerned he would loose those benefits.  He has an aunt who may be able to assist with taking him to [facility]  Patient has not been able to find housing in [place].  [facility] Rep Payee SW Onalee Bien [phone] is present and agreed to continue to look for housing options in [place] to avoid losing the [employer].  Patient will obtain a flip phone once released from jail and request SW follow up in a month with SW Reford Canterbury.  SW also suggested that if patient relocates and is paying more in rent, to report to [employer] for an increase in monthly allotment.  SDOH Interventions    Flowsheet Row Patient Outreach Telephone from 12/24/2023 in [place] POPULATION HEALTH DEPARTMENT  SDOH  Interventions   Food Insecurity Interventions Intervention Not Indicated  [Receives Foodstamps]  Housing Interventions Intervention Not Indicated  Transportation Interventions Intervention Not Indicated  [DSS Rep Payee assist]  Utilities Interventions Intervention Not Indicated  Financial Strain Interventions Intervention Not Indicated  [Patient has DSS Rep Payee]         Recommendation:   No recommendations at this time.  Follow Up Plan:   Telephone follow up appointment date/time:  01/25/24 at 9:00am.  Dallis Dues, BSW [place]  [facility], [facility] Social Worker Direct Dial: [phone]  Fax: [phone] Website: [email or web address]

## 2023-12-25 ENCOUNTER — Ambulatory Visit (HOSPITAL_COMMUNITY): Admission: RE | Admit: 2023-12-25 | Source: Home / Self Care | Admitting: Gastroenterology

## 2023-12-25 ENCOUNTER — Encounter (HOSPITAL_COMMUNITY): Admission: RE | Payer: Self-pay | Source: Home / Self Care

## 2023-12-25 SURGERY — COLONOSCOPY
Anesthesia: Choice

## 2024-01-07 ENCOUNTER — Telehealth (HOSPITAL_COMMUNITY): Payer: Self-pay | Admitting: Physical Therapy

## 2024-01-07 ENCOUNTER — Encounter (HOSPITAL_COMMUNITY): Admitting: Physical Therapy

## 2024-01-07 NOTE — Telephone Encounter (Signed)
 Pt did not show, cancelled all remaining appt except next due to compliance/NS policy.  Lorenso Romance, PTA/CLT [facility] Ph: [phone]

## 2024-01-12 ENCOUNTER — Ambulatory Visit: Admitting: Nurse Practitioner

## 2024-01-13 ENCOUNTER — Encounter (HOSPITAL_COMMUNITY): Admitting: Physical Therapy

## 2024-01-15 ENCOUNTER — Encounter (HOSPITAL_COMMUNITY)

## 2024-01-18 ENCOUNTER — Encounter (HOSPITAL_COMMUNITY)

## 2024-01-20 ENCOUNTER — Encounter (HOSPITAL_COMMUNITY)

## 2024-01-25 ENCOUNTER — Encounter (HOSPITAL_COMMUNITY)

## 2024-01-25 ENCOUNTER — Other Ambulatory Visit: Payer: Self-pay

## 2024-01-25 NOTE — Patient Instructions (Signed)

## 2024-01-25 NOTE — Patient Outreach (Signed)
 Complex Care Management   Visit Note  01/25/2024  Name:  Kyle Collins MRN: [id number] DOB: 12/26/1985  Situation: Referral received for Complex Care Management related to SDOH Barriers:  Housing post jail discharge I obtained verbal consent from Patient.  Visit completed with Rep Payee SW Izetta Charles  on the phone  Background:   Past Medical History:  Diagnosis Date   ADHD (attention deficit hyperactivity disorder)    Anxiety    Bipolar disorder (HCC)    Depression    Diabetes mellitus    GERD (gastroesophageal reflux disease)    Headache    Hemochromatosis 2019   Hypertension    Polycythemia    Sleep apnea    Thyroid  disease     Assessment:  Patients Rep Payee SW Izetta Charles reports that patient is in jail.  His bond was set very high and he has not been released.  SW Charles reports In Home Special Assistance payments will end in 30 days.  SW does suggest contacting [employer] to notify of jail after 30 days.  SW agreed to notify the provider that patient is in jail.  SW Charles will follow up with patient once he is discharged and returns to [employer] Rep Payee services.  Recommendation:   SW will notify provider that patient is in jail.  Follow Up Plan:   Patient has met all care management goals. Care Management case will be closed. Patient has been provided contact information should new needs arise.   Tillman Gardener, BSW [place]  [facility], [facility] Social Worker Direct Dial: [phone]  Fax: [phone] Website: [email or web address]

## 2024-01-27 ENCOUNTER — Encounter (HOSPITAL_COMMUNITY)

## 2024-02-01 ENCOUNTER — Encounter (HOSPITAL_COMMUNITY)

## 2024-02-03 ENCOUNTER — Encounter (HOSPITAL_COMMUNITY)

## 2024-02-08 ENCOUNTER — Encounter (HOSPITAL_COMMUNITY): Admitting: Physical Therapy

## 2024-02-10 ENCOUNTER — Encounter (HOSPITAL_COMMUNITY)

## 2024-02-11 ENCOUNTER — Ambulatory Visit (INDEPENDENT_AMBULATORY_CARE_PROVIDER_SITE_OTHER): Admitting: Gastroenterology

## 2024-02-12 ENCOUNTER — Ambulatory Visit: Payer: Self-pay | Admitting: Family Medicine

## 2024-02-15 ENCOUNTER — Ambulatory Visit (INDEPENDENT_AMBULATORY_CARE_PROVIDER_SITE_OTHER): Admitting: Gastroenterology

## 2024-02-15 ENCOUNTER — Ambulatory Visit: Payer: Self-pay | Admitting: Cardiology

## 2024-02-17 ENCOUNTER — Encounter: Payer: Self-pay | Admitting: Family Medicine

## 2024-02-17 ENCOUNTER — Ambulatory Visit: Payer: Self-pay | Admitting: Cardiology

## 2024-02-22 ENCOUNTER — Ambulatory Visit (INDEPENDENT_AMBULATORY_CARE_PROVIDER_SITE_OTHER): Admitting: Gastroenterology

## 2024-05-11 ENCOUNTER — Encounter (INDEPENDENT_AMBULATORY_CARE_PROVIDER_SITE_OTHER): Payer: Self-pay | Admitting: Gastroenterology

## 2024-06-30 ENCOUNTER — Ambulatory Visit: Admitting: Urology
